# Patient Record
Sex: Male | Born: 1935
Health system: Southern US, Community
[De-identification: ages and names within clinical notes are randomized; demographics above are authoritative.]

## PROBLEM LIST (undated history)

## (undated) DIAGNOSIS — H8109 Meniere's disease, unspecified ear: Secondary | ICD-10-CM

## (undated) DIAGNOSIS — H9190 Unspecified hearing loss, unspecified ear: Secondary | ICD-10-CM

## (undated) DIAGNOSIS — Z95828 Presence of other vascular implants and grafts: Secondary | ICD-10-CM

## (undated) DIAGNOSIS — E039 Hypothyroidism, unspecified: Secondary | ICD-10-CM

## (undated) DIAGNOSIS — IMO0001 Reserved for inherently not codable concepts without codable children: Secondary | ICD-10-CM

## (undated) DIAGNOSIS — C801 Malignant (primary) neoplasm, unspecified: Secondary | ICD-10-CM

## (undated) DIAGNOSIS — T4145XA Adverse effect of unspecified anesthetic, initial encounter: Secondary | ICD-10-CM

## (undated) DIAGNOSIS — D649 Anemia, unspecified: Secondary | ICD-10-CM

## (undated) DIAGNOSIS — Z862 Personal history of diseases of the blood and blood-forming organs and certain disorders involving the immune mechanism: Secondary | ICD-10-CM

## (undated) DIAGNOSIS — Z66 Do not resuscitate: Secondary | ICD-10-CM

## (undated) DIAGNOSIS — T8859XA Other complications of anesthesia, initial encounter: Secondary | ICD-10-CM

## (undated) HISTORY — PX: OTHER SURGICAL HISTORY: SHX169

## (undated) HISTORY — DX: Meniere's disease, unspecified ear: H81.09

## (undated) HISTORY — PX: CIRCUMCISION: SUR203

## (undated) HISTORY — DX: Do not resuscitate: Z66

## (undated) HISTORY — DX: Presence of other vascular implants and grafts: Z95.828

## (undated) HISTORY — PX: COLONOSCOPY: SHX174

---

## 1958-09-28 DIAGNOSIS — Z862 Personal history of diseases of the blood and blood-forming organs and certain disorders involving the immune mechanism: Secondary | ICD-10-CM

## 1958-09-28 HISTORY — DX: Personal history of diseases of the blood and blood-forming organs and certain disorders involving the immune mechanism: Z86.2

## 1998-05-18 ENCOUNTER — Emergency Department (HOSPITAL_COMMUNITY): Admission: EM | Admit: 1998-05-18 | Discharge: 1998-05-19 | Payer: Self-pay | Admitting: Emergency Medicine

## 1998-05-20 ENCOUNTER — Ambulatory Visit (HOSPITAL_COMMUNITY): Admission: RE | Admit: 1998-05-20 | Discharge: 1998-05-20 | Payer: Self-pay | Admitting: Emergency Medicine

## 1998-05-24 ENCOUNTER — Ambulatory Visit (HOSPITAL_COMMUNITY): Admission: RE | Admit: 1998-05-24 | Discharge: 1998-05-24 | Payer: Self-pay | Admitting: *Deleted

## 1998-09-28 HISTORY — PX: ORIF ORBITAL FRACTURE: SHX5312

## 1999-08-13 ENCOUNTER — Emergency Department (HOSPITAL_COMMUNITY): Admission: EM | Admit: 1999-08-13 | Discharge: 1999-08-13 | Payer: Self-pay | Admitting: Emergency Medicine

## 1999-09-09 ENCOUNTER — Ambulatory Visit (HOSPITAL_COMMUNITY): Admission: RE | Admit: 1999-09-09 | Discharge: 1999-09-09 | Payer: Self-pay | Admitting: Gastroenterology

## 2001-03-08 ENCOUNTER — Encounter: Admission: RE | Admit: 2001-03-08 | Discharge: 2001-03-08 | Payer: Self-pay | Admitting: Internal Medicine

## 2001-03-08 ENCOUNTER — Encounter: Payer: Self-pay | Admitting: Internal Medicine

## 2003-12-18 ENCOUNTER — Encounter: Admission: RE | Admit: 2003-12-18 | Discharge: 2003-12-18 | Payer: Self-pay | Admitting: Internal Medicine

## 2004-01-15 ENCOUNTER — Emergency Department (HOSPITAL_COMMUNITY): Admission: EM | Admit: 2004-01-15 | Discharge: 2004-01-15 | Payer: Self-pay | Admitting: Emergency Medicine

## 2005-02-17 ENCOUNTER — Ambulatory Visit: Admission: RE | Admit: 2005-02-17 | Discharge: 2005-02-17 | Payer: Self-pay | Admitting: Gastroenterology

## 2005-02-17 ENCOUNTER — Encounter (INDEPENDENT_AMBULATORY_CARE_PROVIDER_SITE_OTHER): Payer: Self-pay | Admitting: Specialist

## 2005-03-30 ENCOUNTER — Encounter: Admission: RE | Admit: 2005-03-30 | Discharge: 2005-03-30 | Payer: Self-pay | Admitting: Internal Medicine

## 2005-04-06 ENCOUNTER — Encounter: Admission: RE | Admit: 2005-04-06 | Discharge: 2005-04-06 | Payer: Self-pay | Admitting: Internal Medicine

## 2006-08-23 ENCOUNTER — Ambulatory Visit: Payer: Self-pay | Admitting: Internal Medicine

## 2006-08-25 LAB — CBC WITH DIFFERENTIAL/PLATELET
BASO%: 0.7 % (ref 0.0–2.0)
Basophils Absolute: 0 10*3/uL (ref 0.0–0.1)
EOS%: 0.3 % (ref 0.0–7.0)
MCH: 32.9 pg (ref 28.0–33.4)
MCHC: 33.8 g/dL (ref 32.0–35.9)
MCV: 97.3 fL (ref 81.6–98.0)
MONO%: 23 % — ABNORMAL HIGH (ref 0.0–13.0)
NEUT#: 1.5 10*3/uL (ref 1.5–6.5)
Platelets: 212 10*3/uL (ref 145–400)
RBC: 3.68 10*6/uL — ABNORMAL LOW (ref 4.20–5.71)
RDW: 24.8 % — ABNORMAL HIGH (ref 11.2–14.6)
WBC: 5.3 10*3/uL (ref 4.0–10.0)

## 2006-08-26 LAB — COMPREHENSIVE METABOLIC PANEL
Calcium: 9.6 mg/dL (ref 8.4–10.5)
Creatinine, Ser: 0.9 mg/dL (ref 0.40–1.50)
Glucose, Bld: 89 mg/dL (ref 70–99)
Potassium: 4.4 mEq/L (ref 3.5–5.3)
Sodium: 141 mEq/L (ref 135–145)
Total Protein: 7.2 g/dL (ref 6.0–8.3)

## 2006-08-26 LAB — IRON AND TIBC
%SAT: 56 % — ABNORMAL HIGH (ref 20–55)
TIBC: 303 ug/dL (ref 215–435)

## 2006-08-26 LAB — FOLATE RBC: RBC Folate: 1689 ng/mL — ABNORMAL HIGH (ref 180–600)

## 2006-08-26 LAB — LACTATE DEHYDROGENASE: LDH: 109 U/L (ref 94–250)

## 2006-08-31 LAB — BCR/ABL QUALITATIVE: BCR/ABL t(9,22): NEGATIVE

## 2007-02-23 ENCOUNTER — Encounter: Admission: RE | Admit: 2007-02-23 | Discharge: 2007-02-23 | Payer: Self-pay | Admitting: Orthopedic Surgery

## 2007-02-24 ENCOUNTER — Ambulatory Visit (HOSPITAL_BASED_OUTPATIENT_CLINIC_OR_DEPARTMENT_OTHER): Admission: RE | Admit: 2007-02-24 | Discharge: 2007-02-24 | Payer: Self-pay | Admitting: Orthopedic Surgery

## 2007-06-29 ENCOUNTER — Encounter: Admission: RE | Admit: 2007-06-29 | Discharge: 2007-06-29 | Payer: Self-pay | Admitting: Internal Medicine

## 2007-07-08 ENCOUNTER — Encounter: Admission: RE | Admit: 2007-07-08 | Discharge: 2007-07-08 | Payer: Self-pay | Admitting: Orthopedic Surgery

## 2007-07-29 ENCOUNTER — Encounter: Admission: RE | Admit: 2007-07-29 | Discharge: 2007-07-29 | Payer: Self-pay | Admitting: Internal Medicine

## 2007-08-22 ENCOUNTER — Encounter: Admission: RE | Admit: 2007-08-22 | Discharge: 2007-08-22 | Payer: Self-pay | Admitting: Internal Medicine

## 2007-08-30 ENCOUNTER — Encounter: Admission: RE | Admit: 2007-08-30 | Discharge: 2007-08-30 | Payer: Self-pay | Admitting: Internal Medicine

## 2007-09-08 ENCOUNTER — Encounter: Admission: RE | Admit: 2007-09-08 | Discharge: 2007-09-08 | Payer: Self-pay | Admitting: Internal Medicine

## 2007-09-15 ENCOUNTER — Encounter: Admission: RE | Admit: 2007-09-15 | Discharge: 2007-09-15 | Payer: Self-pay | Admitting: Internal Medicine

## 2007-10-21 ENCOUNTER — Encounter: Admission: RE | Admit: 2007-10-21 | Discharge: 2007-10-21 | Payer: Self-pay | Admitting: Internal Medicine

## 2008-05-10 ENCOUNTER — Encounter: Admission: RE | Admit: 2008-05-10 | Discharge: 2008-05-10 | Payer: Self-pay | Admitting: Internal Medicine

## 2008-10-04 ENCOUNTER — Ambulatory Visit (HOSPITAL_COMMUNITY): Admission: RE | Admit: 2008-10-04 | Discharge: 2008-10-04 | Payer: Self-pay | Admitting: Otolaryngology

## 2009-09-28 HISTORY — PX: OTHER SURGICAL HISTORY: SHX169

## 2010-03-25 ENCOUNTER — Encounter: Admission: RE | Admit: 2010-03-25 | Discharge: 2010-03-25 | Payer: Self-pay | Admitting: Internal Medicine

## 2010-05-23 ENCOUNTER — Emergency Department (HOSPITAL_COMMUNITY)
Admission: EM | Admit: 2010-05-23 | Discharge: 2010-05-23 | Payer: Self-pay | Source: Home / Self Care | Admitting: Emergency Medicine

## 2010-10-19 ENCOUNTER — Encounter: Payer: Self-pay | Admitting: Internal Medicine

## 2011-02-10 NOTE — Op Note (Signed)
Dean Stanley, Dean Stanley              ACCOUNT NO.:  [id number]   MEDICAL RECORD NO.:  [id number]          PATIENT TYPE:  AMB   LOCATION:  DSC                          FACILITY:  [facility]   PHYSICIAN:  Rodney A. Mortenson, M.D.DATE OF BIRTH:  Feb 12, 1931   DATE OF PROCEDURE:  02/24/2007  DATE OF DISCHARGE:                               OPERATIVE REPORT   PREOPERATIVE DIAGNOSIS:  Severe osteoarthritis, right great toe  metacarpophalangeal joint.   POSTOPERATIVE DIAGNOSIS:  Severe osteoarthritis, right great toe  metacarpophalangeal joint.   OPERATION:  Arthrotomy right great toe MP joint, debridement huge  osteophyte; Keller procedure, right great toe.   SURGEON:  Lenard Galloway. Chaney Malling, M.D.   ANESTHESIA:  Regional.   PROCEDURE:  The patient placed on the operating table in supine position  with pneumatic tourniquet about the right upper thigh.  Right lower  extremity prepped DuraPrep, draped out in the usual manner.  Esmarch was  used to wrap out the foot and calf and this was used as tourniquet.  Incision was made over the dorsal aspect of the right great toe MP  joint.  Skin edges were retracted.  Superficial cutaneous nerves were  preserved and retracted out of the operative area.  Incision made over  the dorsal aspect the MP joint and MP joint was open.  Huge loose  osteophytes were found about the joint.  Severe degenerative changes  were seen.  The osteophytes were removed. Loose bodies removed.  A power  saw was then used to resect about 8 mm of the proximal phalanx to the  great toe.  This was shelled out of the joint.  Once this was  accomplished, other loose bodies removed from the joint.  A ring of  osteophytes was seen around the MP joint itself.  These this was  debrided with rongeur.  Excellent decompression was achieved.  A large  Steinmann pin was passed through the toe retrograde from the cut surface  out through the tip of the toe.  Vicryl used to take soft tissue in  pursestring manner and form a soft tissue envelope over the distal end  of the MP joint.  With the proximal phalanx in the appropriate position,  the small Steinmann pin was then passed antegrade up into the first  metatarsal shaft.  He had excellent stability and alignment of the toe  was achieved.  Esmarch was then removed.  Bleeders were coagulated.  Interrupted 4-0 nylon sutures then used to close this skin.  Sterile  dressing applied and the patient returned recovery room in excellent  condition.  Technically, this went extremely well.   DISPOSITION:  1. Percocet for pain.  2. Wooden shoe.  3. May walk on this ad lib.  4. Return to my office on Wednesday of next week.           ______________________________  Lenard Galloway. Chaney Malling, M.D.    RAM/MEDQ  D:  02/24/2007  T:  02/24/2007  Job:  [id number]

## 2011-02-11 ENCOUNTER — Other Ambulatory Visit: Payer: Self-pay | Admitting: Dermatology

## 2011-02-13 NOTE — Op Note (Signed)
NAMEJOVANNIE, Dean Stanley              ACCOUNT NO.:  [id number]   MEDICAL RECORD NO.:  [id number]          PATIENT TYPE:  AMB   LOCATION:  DFTL                         FACILITY:  [facility]   PHYSICIAN:  Petra Kuba, M.D.    DATE OF BIRTH:  November 26, 1930   DATE OF PROCEDURE:  02/17/2005  DATE OF DISCHARGE:                                 OPERATIVE REPORT   PROCEDURE:  Colonoscopy with polypectomy.   INDICATIONS FOR PROCEDURE:  Screening.   Consent was signed after risks, benefits, methods, and options were  thoroughly discussed in the office in the past.   MEDICINES USED:  Demerol 60, Versed 7.   DESCRIPTION OF PROCEDURE:  Rectal inspection was pertinent for external  hemorrhoids, small. Digital exam was negative. The video pediatric  adjustable colonoscope was inserted, easily advanced around the colon to the  cecum. This did require some abdominal pressure but no position changes. On  insertion, occasional left sided diverticula were seen.  The cecum was  identified by the appendiceal orifice and the ileocecal valve. The prep was  adequate.  There was some liquid stool that required washing and suctioning  and on slow withdrawal through the colon, the cecum and the ascending was  normal. In the approximate level of the hepatic flexure, a tiny polyp was  seen and was hot biopsied x1. The scope was slowly withdrawn. Other than the  occasional left sided diverticula, no other polypoid lesions, masses or  other abnormalities were seen as we slowly withdrew back to the rectum. Anal  rectal pullthrough and retroflexion confirmed some small hemorrhoids. The  scope was straightened and readvanced a short ways up the left side of the  colon, air was suctioned, scope removed. The patient tolerated the procedure  well. There was no obvious or immediate complications.   ENDOSCOPIC DIAGNOSIS:  1.  Internal and external hemorrhoids.  2.  Occasional left sided diverticula.  3.  Tiny proximal  transverse polyp hot biopsied.  4.  Otherwise within normal limits to the cecum.   PLAN:  Await pathology but probably recheck colon screening in 5-10 years if  doing well medically unless this is an adenoma then I would proceed in five.      MEM/MEDQ  D:  02/17/2005  T:  02/17/2005  Job:  [id number]   cc:   Antony Madura, M.D.  [address], [address]  [place]  [place] [address]  Fax: [phone]

## 2011-04-20 ENCOUNTER — Other Ambulatory Visit: Payer: Self-pay | Admitting: Physical Medicine and Rehabilitation

## 2011-04-20 DIAGNOSIS — M545 Low back pain: Secondary | ICD-10-CM

## 2011-11-02 ENCOUNTER — Other Ambulatory Visit: Payer: Self-pay | Admitting: Internal Medicine

## 2011-11-02 ENCOUNTER — Ambulatory Visit
Admission: RE | Admit: 2011-11-02 | Discharge: 2011-11-02 | Disposition: A | Discharge status: Home / Self Care | Payer: Medicare Other | Source: Ambulatory Visit | Attending: Internal Medicine | Admitting: Internal Medicine

## 2011-11-02 DIAGNOSIS — R05 Cough: Secondary | ICD-10-CM

## 2012-01-21 ENCOUNTER — Emergency Department (HOSPITAL_COMMUNITY): Payer: Medicare Other

## 2012-01-21 ENCOUNTER — Emergency Department (HOSPITAL_COMMUNITY)
Admission: EM | Admit: 2012-01-21 | Discharge: 2012-01-21 | Disposition: A | Discharge status: Home Health | Payer: Medicare Other | Attending: Emergency Medicine | Admitting: Emergency Medicine

## 2012-01-21 ENCOUNTER — Encounter (HOSPITAL_COMMUNITY): Payer: Self-pay | Admitting: Family Medicine

## 2012-01-21 DIAGNOSIS — W19XXXA Unspecified fall, initial encounter: Secondary | ICD-10-CM | POA: Insufficient documentation

## 2012-01-21 DIAGNOSIS — S20219A Contusion of unspecified front wall of thorax, initial encounter: Secondary | ICD-10-CM

## 2012-01-21 MED ORDER — HYDROCODONE-ACETAMINOPHEN 5-325 MG PO TABS: 2.0000 | ORAL_TABLET | ORAL | Status: AC | PRN

## 2012-01-21 MED ORDER — ONDANSETRON 8 MG PO TBDP
8.0000 mg | ORAL_TABLET | Freq: Once | ORAL | Status: AC
  Administered 2012-01-21: 8 mg via ORAL
  Filled 2012-01-21: qty 1

## 2012-01-21 MED ORDER — TETANUS-DIPHTH-ACELL PERTUSSIS 5-2.5-18.5 LF-MCG/0.5 IM SUSP
0.5000 mL | Freq: Once | INTRAMUSCULAR | Status: AC
  Administered 2012-01-21: 0.5 mL via INTRAMUSCULAR
  Filled 2012-01-21: qty 0.5

## 2012-01-21 MED ORDER — FENTANYL CITRATE 0.05 MG/ML IJ SOLN
50.0000 ug | Freq: Once | INTRAMUSCULAR | Status: AC
  Administered 2012-01-21: 50 ug via INTRAMUSCULAR
  Filled 2012-01-21: qty 2

## 2012-01-21 MED ORDER — ONDANSETRON 8 MG PO TBDP: 8.0000 mg | ORAL_TABLET | Freq: Three times a day (TID) | ORAL | Status: AC | PRN

## 2012-01-21 NOTE — ED Notes (Signed)
Patient states he missed a step last night and fell. No LOC. C/o left rib pain. Pain worse with inspiration.

## 2012-01-21 NOTE — Discharge Instructions (Signed)
Bone Bruise  A bone bruise is a small hidden fracture of the bone. It typically occurs with bones located close to the surface of the skin.  SYMPTOMS  The pain lasts longer than a normal bruise.   The bruised area is difficult to use.   There may be discoloration or swelling of the bruised area.   When a bone bruise is found with injury to the anterior cruciate ligament (in the knee) there is often an increased:   Amount of fluid in the knee   Time the fluid in the knee lasts.   Number of days until you are walking normally and regaining the motion you had before the injury.   Number of days with pain from the injury.  DIAGNOSIS  It can only be seen on X-rays known as MRIs. This stands for magnetic resonance imaging. A regular X-ray taken of a bone bruise would appear to be normal. A bone bruise is a common injury in the knee and the heel bone (calcaneus). The problems are similar to those produced by stress fractures, which are bone injuries caused by overuse. A bone bruise may also be a sign of other injuries. For example, bone bruises are commonly found where an anterior cruciate ligament (ACL) in the knee has been pulled away from the bone (ruptured). A ligament is a tough fibrous material that connects bones together to make our joints stable. Bruises of the bone last a lot longer than bruises of the muscle or tissues beneath the skin. Bone bruises can last from days to months and are often more severe and painful than other bruises. TREATMENT Because bone bruises are sudden injuries you cannot often prevent them, other than by being extremely careful. Some things you can do to improve the condition are:  Apply ice to the sore area for 15 to 20 minutes, 3 to 4 times per day while awake for the first 2 days. Put the ice in a plastic bag, and place a towel between the bag of ice and your skin.   Keep your bruised area raised (elevated) when possible to lessen swelling.   For activity:     Use crutches when necessary; do not put weight on the injured leg until you are no longer tender.   You may walk on your affected part as the pain allows, or as instructed.   Start weight bearing gradually on the bruised part.   Continue to use crutches or a cane until you can stand without causing pain, or as instructed.   If a plaster splint was applied, wear the splint until you are seen for a follow-up examination. Rest it on nothing harder than a pillow the first 24 hours. Do not put weight on it. Do not get it wet. You may take it off to take a shower or bath.   If an air splint was applied, more air may be blown into or out of the splint as needed for comfort. You may take it off at night and to take a shower or bath.   Wiggle your toes in the splint several times per day if you are able.   You may have been given an elastic bandage to use with the plaster splint or alone. The splint is too tight if you have numbness, tingling or if your foot becomes cold and blue. Adjust the bandage to make it comfortable.   Only take over-the-counter or prescription medicines for pain, discomfort, or fever as directed by   your caregiver.   Follow all instructions for follow up with your caregiver. This includes any orthopedic referrals, physical therapy, and rehabilitation. Any delay in obtaining necessary care could result in a delay or failure of the bones to heal.  SEEK MEDICAL CARE IF:   You have an increase in bruising, swelling, or pain.   You notice coldness of your toes.   You do not get pain relief with medications.  SEEK IMMEDIATE MEDICAL CARE IF:   Your toes are numb or blue.   You have severe pain not controlled with medications.   If any of the problems that caused you to seek care are becoming worse.  Document Released: 12/05/2003 Document Revised: 09/03/2011 Document Reviewed: 04/18/2008 ExitCare Patient Information 2012 [employer]  RESOURCE GUIDE  Dental  Problems  Patients with Medicaid: [facility]                     [facility] [address]                                           [address] Phone:  [phone]                                                   Phone:  [phone]  If unable to pay or uninsured, contact:  [facility] or [facility] to become qualified for the adult dental clinic.  Chronic Pain Problems Contact [facility]  [phone] Patients need to be referred by their primary care doctor.  Insufficient Money for Medicine Contact [employer]:  call "211" or [facility] 271-5999.  No Primary Care Doctor Call [facility]  832-8000 Other agencies that provide inexpensive medical care    [facility]  [phone]    [facility]  [phone]    [facility]  [phone]    [facility] Clinic  [phone]    [facility]  [phone]    [facility]  [phone]  Psychological Services [facility]  [phone] [facility]  [phone] [facility]   800 853-5163 (emergency services 641-4993)  Abuse/Neglect [place] Child Abuse Hotline (336) 641-3795 [place] [facility] Abuse Hotline 800-378-5315 (After Hours)  Emergency Shelter [facility] (336) 271-5985  [facility] (336) 275-9566 [facility] (704) 372-4663  MRSA Hotline #:   832-7006    [place] [facility]  [facility] [place]  [employer]                           [facility] [address] [place]                     [address]         [address]  [place]                                                 [place]                              [place] Phone:  [phone]                                  Phone:  [phone]                   Phone:  [phone]  [facility] Phone:  [phone]  [place] Child Abuse Hotline (336) 342-1394 (336) 342-3537 (After Hours)  

## 2012-01-21 NOTE — ED Provider Notes (Signed)
History     CSN: [id number]  Arrival date & time 01/21/12  0301   First MD Initiated Contact with Patient 01/21/12 [phone]      Chief Complaint  Patient presents with  . Fall  . Rib Injury    (Consider location/radiation/quality/duration/timing/severity/associated sxs/prior treatment) HPI  80yoM presents with rib pain after fall. Pt states that just pta he "missed a step and fell onto the cement". Denies head trauma. Fell onto left side. C/O left sided chest/rib pain, worse with movement and deep breathing. Denies shortness of breath but "can't get in a full breath". Denies neck pain, back pain. No anticoagulants. Abrasions to b/l knees. Last tetanus 6 years ago.   ED Notes, ED Provider Notes from 01/21/12 0000 to 01/21/12 03:11:11       Dorris Carnes, RN 01/21/2012 03:09      Patient states he missed a step last night and fell. No LOC. C/o left rib pain. Pain worse with inspiration.     History reviewed. No pertinent past medical history.  Past Surgical History  Procedure Date  . Colonoscopy     History reviewed. No pertinent family history.  History  Substance Use Topics  . Smoking status: Never Smoker   . Smokeless tobacco: Not on file  . Alcohol Use: Yes     Occasional    Review of Systems  All other systems reviewed and are negative.  except as noted HPI  Allergies  Hydrocodone and Oxycodone  Home Medications   Current Outpatient Rx  Name Route Sig Dispense Refill  . DESMOPRESSIN ACETATE 0.2 MG PO TABS Oral Take 0.2 mg by mouth daily.    Marland Kitchen LEVOTHYROXINE SODIUM 200 MCG PO TABS Oral Take 200 mcg by mouth daily.    Marland Kitchen PREDNISONE 10 MG PO TABS Oral Take 10 mg by mouth daily.    Marland Kitchen HYDROCODONE-ACETAMINOPHEN 5-325 MG PO TABS Oral Take 2 tablets by mouth every 4 (four) hours as needed for pain. 10 tablet 0  . ONDANSETRON 8 MG PO TBDP Oral Take 1 tablet (8 mg total) by mouth every 8 (eight) hours as needed for nausea. 20 tablet 0    BP 118/65  Pulse 32   Temp(Src) 98.7 F (37.1 C) (Oral)  Resp 16  Wt 186 lb (84.369 kg)  SpO2 95%  Physical Exam  Nursing note and vitals reviewed. Constitutional: He is oriented to person, place, and time. He appears well-developed and well-nourished. No distress.  HENT:  Head: Atraumatic.  Mouth/Throat: Oropharynx is clear and moist.  Eyes: Conjunctivae are normal. Pupils are equal, round, and reactive to light.  Neck: Neck supple.  Cardiovascular: Normal rate, regular rhythm, normal heart sounds and intact distal pulses.  Exam reveals no gallop and no friction rub.   No murmur heard. Pulmonary/Chest: Effort normal. No respiratory distress. He has no wheezes. He has no rales. He exhibits tenderness.         Quick, shallow breathing 2/2 pain  Abdominal: Soft. Bowel sounds are normal. There is no tenderness. There is no rebound and no guarding.  Musculoskeletal: Normal range of motion. He exhibits no edema and no tenderness.  Neurological: He is alert and oriented to person, place, and time.  Skin: Skin is warm and dry.       B/l knee with < 1cm abrasions  Rt hand palmar aspect with min abrasion  Psychiatric: He has a normal mood and affect.    ED Course  Procedures (including critical care time)  Labs  Reviewed - No data to display Dg Ribs Unilateral W/chest Left  01/21/2012  *RADIOLOGY REPORT*  Clinical Data: Left upper anterior rib pain after fall.  LEFT RIBS AND CHEST - 3+ VIEW  Comparison: 11/02/2011  Findings: Normal heart size and pulmonary vascularity. Peribronchial thickening and interstitial changes consistent with chronic bronchitis.  Interval development of infiltration or atelectasis in the left lung base.  No blunting of costophrenic angles.  No pneumothorax.  The left ribs appear intact.  No evidence of acute fracture or displacement.  No focal bone lesion or expansile changes.  IMPRESSION: Atelectasis or infiltration in the left lung base.  No displaced left rib fractures identified.   Original Report Authenticated By: Marlon Pel, M.D.   1. Rib contusion   2. Fall    MDM  S/P mechanical fall with rib/chest wall contusion. Fentanyl given in ED with zofran for pain control. Feeling better than previous.  "allergy" to hydrocodone includes nausea but patient tolerated it previously after finger injury 04/2010. Zofran odt as well for home. Incentive spirometer. Tetanus updated. No EMC precluding discharge at this time. Given Precautions for return. PMD f/u.         Forbes Cellar, MD 01/21/12 [phone]

## 2012-06-07 ENCOUNTER — Other Ambulatory Visit: Payer: Self-pay | Admitting: Dermatology

## 2012-12-05 ENCOUNTER — Other Ambulatory Visit: Payer: Self-pay | Admitting: Dermatology

## 2013-12-04 ENCOUNTER — Other Ambulatory Visit: Payer: Self-pay | Admitting: Dermatology

## 2013-12-27 ENCOUNTER — Other Ambulatory Visit: Payer: Self-pay | Admitting: Orthopedic Surgery

## 2013-12-27 DIAGNOSIS — R1900 Intra-abdominal and pelvic swelling, mass and lump, unspecified site: Secondary | ICD-10-CM

## 2014-01-05 ENCOUNTER — Ambulatory Visit
Admission: RE | Admit: 2014-01-05 | Discharge: 2014-01-05 | Disposition: A | Discharge status: Home / Self Care | Payer: Medicare Other | Source: Ambulatory Visit | Attending: Orthopedic Surgery | Admitting: Orthopedic Surgery

## 2014-01-05 DIAGNOSIS — R1900 Intra-abdominal and pelvic swelling, mass and lump, unspecified site: Secondary | ICD-10-CM

## 2014-01-05 MED ORDER — GADOBENATE DIMEGLUMINE 529 MG/ML IV SOLN
17.0000 mL | Freq: Once | INTRAVENOUS | Status: AC | PRN
  Administered 2014-01-05: 17 mL via INTRAVENOUS

## 2014-11-16 ENCOUNTER — Telehealth: Payer: Self-pay | Admitting: Internal Medicine

## 2014-11-16 NOTE — Telephone Encounter (Signed)
NEED CALL PT BACK DRIVEN ON THE ROAD

## 2014-11-19 ENCOUNTER — Telehealth: Payer: Self-pay | Admitting: Internal Medicine

## 2014-11-19 NOTE — Telephone Encounter (Signed)
pt confirmed appt on 12/19/14 at 11 am w/ Julien Nordmann Dx: anemia Referring Dr. Mancel Bale

## 2014-11-21 ENCOUNTER — Telehealth: Payer: Self-pay | Admitting: Internal Medicine

## 2014-11-21 NOTE — Telephone Encounter (Signed)
Chart delivered.  TG 11/21/12

## 2014-12-18 ENCOUNTER — Other Ambulatory Visit: Payer: Self-pay | Admitting: Internal Medicine

## 2014-12-18 DIAGNOSIS — D649 Anemia, unspecified: Secondary | ICD-10-CM | POA: Insufficient documentation

## 2014-12-18 DIAGNOSIS — D539 Nutritional anemia, unspecified: Secondary | ICD-10-CM

## 2014-12-19 ENCOUNTER — Encounter: Payer: Self-pay | Admitting: Internal Medicine

## 2014-12-19 ENCOUNTER — Ambulatory Visit: Payer: Medicare Other

## 2014-12-19 ENCOUNTER — Other Ambulatory Visit (HOSPITAL_BASED_OUTPATIENT_CLINIC_OR_DEPARTMENT_OTHER): Payer: Medicare Other

## 2014-12-19 ENCOUNTER — Telehealth: Payer: Self-pay | Admitting: Internal Medicine

## 2014-12-19 ENCOUNTER — Ambulatory Visit (HOSPITAL_BASED_OUTPATIENT_CLINIC_OR_DEPARTMENT_OTHER): Payer: Medicare Other | Admitting: Internal Medicine

## 2014-12-19 ENCOUNTER — Telehealth: Payer: Self-pay

## 2014-12-19 VITALS — BP 111/59 | HR 61 | Temp 97.5°F | Resp 18 | Ht 72.0 in | Wt 179.5 lb

## 2014-12-19 DIAGNOSIS — D539 Nutritional anemia, unspecified: Secondary | ICD-10-CM

## 2014-12-19 DIAGNOSIS — H8109 Meniere's disease, unspecified ear: Secondary | ICD-10-CM | POA: Insufficient documentation

## 2014-12-19 DIAGNOSIS — D649 Anemia, unspecified: Secondary | ICD-10-CM

## 2014-12-19 LAB — COMPREHENSIVE METABOLIC PANEL (CC13)
ALT: 14 U/L (ref 0–55)
ANION GAP: 8 meq/L (ref 3–11)
AST: 18 U/L (ref 5–34)
Albumin: 4.3 g/dL (ref 3.5–5.0)
Alkaline Phosphatase: 44 U/L (ref 40–150)
BILIRUBIN TOTAL: 1.43 mg/dL — AB (ref 0.20–1.20)
BUN: 12.1 mg/dL (ref 7.0–26.0)
CHLORIDE: 107 meq/L (ref 98–109)
CO2: 25 mEq/L (ref 22–29)
Calcium: 9 mg/dL (ref 8.4–10.4)
Creatinine: 0.8 mg/dL (ref 0.7–1.3)
EGFR: 85 mL/min/{1.73_m2} — AB (ref >90)
GLUCOSE: 90 mg/dL (ref 70–140)
Potassium: 4.5 mEq/L (ref 3.5–5.1)
Sodium: 140 mEq/L (ref 136–145)
TOTAL PROTEIN: 7.4 g/dL (ref 6.4–8.3)

## 2014-12-19 LAB — CBC & DIFF AND RETIC
BASO%: 0.5 % (ref 0.0–2.0)
Basophils Absolute: 0 10*3/uL (ref 0.0–0.1)
EOS ABS: 0 10*3/uL (ref 0.0–0.5)
EOS%: 0.5 % (ref 0.0–7.0)
HCT: 36.7 % — ABNORMAL LOW (ref 38.4–49.9)
HGB: 11.8 g/dL — ABNORMAL LOW (ref 13.0–17.1)
Immature Retic Fract: 11.3 % — ABNORMAL HIGH (ref 3.00–10.60)
LYMPH%: 61.5 % — AB (ref 14.0–49.0)
MCH: 30.8 pg (ref 27.2–33.4)
MCHC: 32.2 g/dL (ref 32.0–36.0)
MCV: 95.8 fL (ref 79.3–98.0)
MONO#: 0.7 10*3/uL (ref 0.1–0.9)
MONO%: 16 % — ABNORMAL HIGH (ref 0.0–14.0)
NEUT%: 21.5 % — ABNORMAL LOW (ref 39.0–75.0)
NEUTROS ABS: 0.9 10*3/uL — AB (ref 1.5–6.5)
NRBC: 2 % — AB (ref 0–0)
PLATELETS: 132 10*3/uL — AB (ref 140–400)
RBC: 3.83 10*6/uL — AB (ref 4.20–5.82)
RDW: 17.6 % — ABNORMAL HIGH (ref 11.0–14.6)
RETIC %: 1.72 % (ref 0.80–1.80)
Retic Ct Abs: 65.88 10*3/uL (ref 34.80–93.90)
WBC: 4.1 10*3/uL (ref 4.0–10.3)
lymph#: 2.5 10*3/uL (ref 0.9–3.3)

## 2014-12-19 LAB — IRON AND TIBC CHCC
%SAT: 72 % — AB (ref 20–55)
IRON: 140 ug/dL (ref 42–163)
TIBC: 194 ug/dL — AB (ref 202–409)
UIBC: 53 ug/dL — AB (ref 117–376)

## 2014-12-19 LAB — LACTATE DEHYDROGENASE (CC13): LDH: 138 U/L (ref 125–245)

## 2014-12-19 LAB — TECHNOLOGIST REVIEW

## 2014-12-19 LAB — FERRITIN CHCC: FERRITIN: 600 ng/mL — AB (ref 22–316)

## 2014-12-19 MED ORDER — INTEGRA PLUS PO CAPS: 1.0000 | ORAL_CAPSULE | Freq: Every morning | ORAL | Status: DC

## 2014-12-19 NOTE — Progress Notes (Signed)
Checked in new pt with no financial concerns. °

## 2014-12-19 NOTE — Telephone Encounter (Signed)
left voicemail for pt regarding to May appt....mailed pt appt sched/avs adn letter

## 2014-12-19 NOTE — Telephone Encounter (Signed)
Pt relaying information of his medical history. Last colonoscopy 2006 with Dr Watt Climes. Dizzy spells in 1980s was from meniere's syndrome.

## 2014-12-21 LAB — PROTEIN ELECTROPHORESIS, SERUM, WITH REFLEX
Albumin ELP: 4.4 g/dL (ref 3.8–4.8)
Alpha-1-Globulin: 0.2 g/dL (ref 0.2–0.3)
Alpha-2-Globulin: 0.5 g/dL (ref 0.5–0.9)
Beta 2: 0.3 g/dL (ref 0.2–0.5)
Beta Globulin: 0.3 g/dL — ABNORMAL LOW (ref 0.4–0.6)
GAMMA GLOBULIN: 1.5 g/dL (ref 0.8–1.7)
Total Protein, Serum Electrophoresis: 7.2 g/dL (ref 6.1–8.1)

## 2014-12-21 LAB — ERYTHROPOIETIN: ERYTHROPOIETIN: 23.4 m[IU]/mL — AB (ref 2.6–18.5)

## 2014-12-22 NOTE — Progress Notes (Signed)
[place] Telephone:([phone]) [phone]   Fax:([phone]) [phone]  CONSULT NOTE  REFERRING PHYSICIAN: Dr. Lorene Dy  REASON FOR CONSULTATION:  81 years old white male with persistent anemia.  HPI Dean Stanley is a 81 y.o. 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 The patient also has a history of vertigo in the 80s in addition to history of sciatica. Otherwise he has been very healthy. He was seen recently by his primary care physician Dr. Mancel Bale for routine physical exam and evaluation and repeat CBC performed on 11/08/2014 showed mild anemia with hemoglobin of 12.0 and hematocrit 36.0%. The patient has normal white blood count of 5.4 and normal platelets count of 160,000. Vitamin B 12 was normal at 907. Serum folate was also normal at 16.7 and serum protein electrophoreses showed nonspecific polyclonal pattern. Serum iron was 81.  Reviewing his previous records showed that the patient had persistent anemia for years. On 07/25/2010 his hemoglobin was 11.9 and hematocrit 37.2%. On 07/27/2011 his hemoglobin was 12.9 and hematocrit 39.6%. On 07/27/2013 his hemoglobin was 13.3 and hematocrit 38.8%. The patient has no significant bleeding issues except for occasional hemorrhoidal bleeding. He denied having any significant bruises or ecchymosis. The patient denied having any significant complaints related to his anemia except for mild fatigue. He denied having any current dizzy spells. He has no chest pain, shortness breath, cough or hemoptysis. His last colonoscopy was performed in 2006 by Dr. Watt Climes and it showed no abnormalities. His other main complaint is persistent back pain secondary to degenerative disc disease and the patient sleeps in a recliner chair masses at time. Family history significant for mother with liver disease and diabetes mellitus and father with pancreatic cancer. He also  has a brother with colon cancer. The patient is married and has 3 children. He used to work as a Pharmacist, hospital and retired in 1987. Has remote history of smoking and quit in 1999. He drinks alcohol occasionally and no history of drug abuse.   HPI  Past Medical History  Diagnosis Date  . Meniere's syndrome 1980s    Past Surgical History  Procedure Laterality Date  . Colonoscopy      History reviewed. No pertinent family history.  Social History History  Substance Use Topics  . Smoking status: Never Smoker   . Smokeless tobacco: Not on file  . Alcohol Use: Yes     Comment: Occasional    Allergies  Allergen Reactions  . Hydrocodone   . Oxycodone     Current Outpatient Prescriptions  Medication Sig Dispense Refill  . desmopressin (DDAVP) 0.2 MG tablet Take 0.2 mg by mouth daily.    Marland Kitchen levothyroxine (SYNTHROID, LEVOTHROID) 200 MCG tablet Take 200 mcg by mouth daily.    Marland Kitchen FeFum-FePoly-FA-B Cmp-C-Biot (INTEGRA PLUS) CAPS Take 1 capsule by mouth every morning. 30 capsule 2   No current facility-administered medications for this visit.    Review of Systems  Constitutional: positive for fatigue Eyes: negative Ears, nose, mouth, throat, and face: negative Respiratory: negative Cardiovascular: negative Gastrointestinal: negative Genitourinary:negative Integument/breast: negative Hematologic/lymphatic: negative Musculoskeletal:negative Neurological: negative Behavioral/Psych: negative Endocrine: negative Allergic/Immunologic: negative  Physical Exam  UVO:ZDGUY, healthy, no distress, well nourished and well developed SKIN: skin color, texture, turgor are normal, no rashes or significant lesions HEAD: Normocephalic, No masses, lesions, tenderness or abnormalities EYES: normal, PERRLA, Conjunctiva are pink and non-injected EARS: External ears normal, Canals clear OROPHARYNX:no  exudate, no erythema and lips, buccal mucosa, and tongue normal  NECK: supple, no adenopathy, no  JVD LYMPH:  no palpable lymphadenopathy, no hepatosplenomegaly LUNGS: clear to auscultation , and palpation HEART: regular rate & rhythm, no murmurs and no gallops ABDOMEN:abdomen soft, non-tender, normal bowel sounds and no masses or organomegaly BACK: Back symmetric, no curvature., No CVA tenderness EXTREMITIES:no joint deformities, effusion, or inflammation, no edema, no skin discoloration, no clubbing  NEURO: alert & oriented x 3 with fluent speech, no focal motor/sensory deficits  PERFORMANCE STATUS: ECOG 1  LABORATORY DATA: Lab Results  Component Value Date   WBC 4.1 12/19/2014   HGB 11.8* 12/19/2014   HCT 36.7* 12/19/2014   MCV 95.8 12/19/2014   PLT 132* 12/19/2014      Chemistry      Component Value Date/Time   NA 140 12/19/2014 1103   NA 141 08/25/2006 1353   K 4.5 12/19/2014 1103   K 4.4 08/25/2006 1353   CL 104 08/25/2006 1353   CO2 25 12/19/2014 1103   CO2 29 08/25/2006 1353   BUN 12.1 12/19/2014 1103   BUN 14 08/25/2006 1353   CREATININE 0.8 12/19/2014 1103   CREATININE 0.90 08/25/2006 1353      Component Value Date/Time   CALCIUM 9.0 12/19/2014 1103   CALCIUM 9.6 08/25/2006 1353   ALKPHOS 44 12/19/2014 1103   ALKPHOS 45 08/25/2006 1353   AST 18 12/19/2014 1103   AST 18 08/25/2006 1353   ALT 14 12/19/2014 1103   ALT 17 08/25/2006 1353   BILITOT 1.43* 12/19/2014 1103   BILITOT 1.2 08/25/2006 1353       RADIOGRAPHIC STUDIES: No results found.  ASSESSMENT: This is a very pleasant 81 years old white male with persistent mild normocytic anemia of unclear etiology. This could be secondary to the minor hemorrhoidal bleed, but another etiology like myelodysplastic syndrome cannot be ruled out at this point. He also has a history of ITP more than 40 years ago and he did not have any issues with that for so many years.   PLAN: I had a lengthy discussion with the patient today about his condition. I ordered several studies for evaluation of his anemia  including repeat CBC, comprehensive metabolic panel, LDH, serum erythropoietin, serum protein electrophoresis, iron study and ferritin. I will empirically start the patient on Integra plus1 capsule by mouth daily for the next few weeks. I will see him back for follow-up visit in 2 months for reevaluation and further recommendation regarding his condition. If no clear etiology for his persistent anemia, I may consider the patient for a bone marrow biopsy and aspirate to rule out any myelodysplastic abnormalities. The patient agreed to the current plan. He was advised to call if he has any concerning symptoms in the interval.  The patient voices understanding of current disease status and treatment options and is in agreement with the current care plan.  All questions were answered. The patient knows to call the clinic with any problems, questions or concerns. We can certainly see the patient much sooner if necessary.  Thank you so much for allowing me to participate in the care of Dean Stanley. I will continue to follow up the patient with you and assist in his care.  I spent 40 minutes counseling the patient face to face. The total time spent in the appointment was 60 minutes.  Disclaimer: This note was dictated with voice recognition software. Similar sounding words can inadvertently be transcribed and may not  be corrected upon review.   Ashwika Freels K. December 22, 2014, 1:28 PM

## 2015-02-18 ENCOUNTER — Telehealth: Payer: Self-pay | Admitting: Internal Medicine

## 2015-02-18 ENCOUNTER — Encounter: Payer: Self-pay | Admitting: Internal Medicine

## 2015-02-18 ENCOUNTER — Other Ambulatory Visit (HOSPITAL_BASED_OUTPATIENT_CLINIC_OR_DEPARTMENT_OTHER): Payer: Medicare Other

## 2015-02-18 ENCOUNTER — Ambulatory Visit (HOSPITAL_BASED_OUTPATIENT_CLINIC_OR_DEPARTMENT_OTHER): Payer: Medicare Other | Admitting: Internal Medicine

## 2015-02-18 VITALS — BP 106/61 | HR 73 | Temp 98.2°F | Resp 18 | Ht 72.0 in | Wt 181.7 lb

## 2015-02-18 DIAGNOSIS — D539 Nutritional anemia, unspecified: Secondary | ICD-10-CM

## 2015-02-18 DIAGNOSIS — D649 Anemia, unspecified: Secondary | ICD-10-CM | POA: Diagnosis present

## 2015-02-18 LAB — CBC WITH DIFFERENTIAL/PLATELET
BASO%: 0.2 % (ref 0.0–2.0)
Basophils Absolute: 0 10*3/uL (ref 0.0–0.1)
EOS%: 0.5 % (ref 0.0–7.0)
Eosinophils Absolute: 0 10*3/uL (ref 0.0–0.5)
HEMATOCRIT: 35.6 % — AB (ref 38.4–49.9)
HGB: 11.8 g/dL — ABNORMAL LOW (ref 13.0–17.1)
LYMPH#: 2.5 10*3/uL (ref 0.9–3.3)
LYMPH%: 43 % (ref 14.0–49.0)
MCH: 32 pg (ref 27.2–33.4)
MCHC: 33.1 g/dL (ref 32.0–36.0)
MCV: 96.5 fL (ref 79.3–98.0)
MONO#: 1.4 10*3/uL — AB (ref 0.1–0.9)
MONO%: 23.3 % — ABNORMAL HIGH (ref 0.0–14.0)
NEUT#: 1.9 10*3/uL (ref 1.5–6.5)
NEUT%: 33 % — AB (ref 39.0–75.0)
Platelets: 125 10*3/uL — ABNORMAL LOW (ref 140–400)
RBC: 3.69 10*6/uL — AB (ref 4.20–5.82)
RDW: 18.5 % — AB (ref 11.0–14.6)
WBC: 5.8 10*3/uL (ref 4.0–10.3)
nRBC: 1 % — ABNORMAL HIGH (ref 0–0)

## 2015-02-18 NOTE — Telephone Encounter (Signed)
per pof to sch pt appt-gave pt copy of sch °

## 2015-02-18 NOTE — Progress Notes (Signed)
[place] [phone]   Fax:([phone]) [place], [place] [address]  DIAGNOSIS: 1)  Mild anemia of chronic disease. 2)  History of ITP  PRIOR THERAPY: none  CURRENT THERAPY: observation.  INTERVAL HISTORY: Dean Stanley 81 y.o. male returns to the clinic today for  Follow-up visit. The patient is feeling fine today except for mild fatigue. He was tried on an empiric course of Integra plus but did not notice any improvement in his condition. His wife give him centrum tablets and he felt a little bit better.  He has extensive studies for evaluation of his anemia that showed no significant abnormalities.  He has repeat  CBC performed earlier today and is here for evaluation and discussion of his lab results.  MEDICAL HISTORY: Past Medical History  Diagnosis Date  . Meniere's syndrome 1980s    ALLERGIES:  is allergic to hydrocodone and oxycodone.  MEDICATIONS:  Current Outpatient Prescriptions  Medication Sig Dispense Refill  . desmopressin (DDAVP) 0.2 MG tablet Take 0.2 mg by mouth daily.    Marland Kitchen FeFum-FePoly-FA-B Cmp-C-Biot (INTEGRA PLUS) CAPS Take 1 capsule by mouth every morning. 30 capsule 2  . levothyroxine (SYNTHROID, LEVOTHROID) 200 MCG tablet Take 200 mcg by mouth daily.     No current facility-administered medications for this visit.    SURGICAL HISTORY:  Past Surgical History  Procedure Laterality Date  . Colonoscopy      REVIEW OF SYSTEMS:  A comprehensive review of systems was negative except for: Constitutional: positive for fatigue   PHYSICAL EXAMINATION: General appearance: alert, cooperative, fatigued and no distress Head: Normocephalic, without obvious abnormality, atraumatic Neck: no adenopathy, no JVD, supple, symmetrical, trachea midline and thyroid not enlarged, symmetric, no tenderness/mass/nodules Lymph nodes: Cervical, supraclavicular, and  axillary nodes normal. Resp: clear to auscultation bilaterally Back: symmetric, no curvature. ROM normal. No CVA tenderness. Cardio: regular rate and rhythm, S1, S2 normal, no murmur, click, rub or gallop GI: soft, non-tender; bowel sounds normal; no masses,  no organomegaly Extremities: extremities normal, atraumatic, no cyanosis or edema  ECOG PERFORMANCE STATUS: 1 - Symptomatic but completely ambulatory  There were no vitals taken for this visit.  LABORATORY DATA: Lab Results  Component Value Date   WBC 5.8 02/18/2015   HGB 11.8* 02/18/2015   HCT 35.6* 02/18/2015   MCV 96.5 02/18/2015   PLT 125* 02/18/2015      Chemistry      Component Value Date/Time   NA 140 12/19/2014 1103   NA 141 08/25/2006 1353   K 4.5 12/19/2014 1103   K 4.4 08/25/2006 1353   CL 104 08/25/2006 1353   CO2 25 12/19/2014 1103   CO2 29 08/25/2006 1353   BUN 12.1 12/19/2014 1103   BUN 14 08/25/2006 1353   CREATININE 0.8 12/19/2014 1103   CREATININE 0.90 08/25/2006 1353      Component Value Date/Time   CALCIUM 9.0 12/19/2014 1103   CALCIUM 9.6 08/25/2006 1353   ALKPHOS 44 12/19/2014 1103   ALKPHOS 45 08/25/2006 1353   AST 18 12/19/2014 1103   AST 18 08/25/2006 1353   ALT 14 12/19/2014 1103   ALT 17 08/25/2006 1353   BILITOT 1.43* 12/19/2014 1103   BILITOT 1.2 08/25/2006 1353       RADIOGRAPHIC STUDIES: No results found.  ASSESSMENT AND PLAN:  This is a very pleasant 81 years old white male with persistent anemia questionable for  anemia of chronic disease versus myelodysplastic syndrome. He was tried on an empiric course of Integra plus with no improvement in his hemoglobin and hematocrit or general condition.  I discussed with the patient again considering a bone marrow biopsy and aspirate versus continuous observation.  He is not interested in proceeding with the bone marrow biopsy and aspirate at this point.  I recommended for him to continue on observation with repeat CBC, iron study  and ferritin 6 months.  He was advised to call immediately if he has any concerning symptoms in the interval. The patient voices understanding of current disease status and treatment options and is in agreement with the current care plan.  All questions were answered. The patient knows to call the clinic with any problems, questions or concerns. We can certainly see the patient much sooner if necessary.  Disclaimer: This note was dictated with voice recognition software. Similar sounding words can inadvertently be transcribed and may not be corrected upon review.

## 2015-08-12 ENCOUNTER — Other Ambulatory Visit: Payer: Self-pay | Admitting: Internal Medicine

## 2015-08-12 ENCOUNTER — Ambulatory Visit
Admission: RE | Admit: 2015-08-12 | Discharge: 2015-08-12 | Disposition: A | Discharge status: Home / Self Care | Payer: Medicare Other | Source: Ambulatory Visit | Attending: Internal Medicine | Admitting: Internal Medicine

## 2015-08-12 DIAGNOSIS — N200 Calculus of kidney: Secondary | ICD-10-CM

## 2015-08-20 ENCOUNTER — Other Ambulatory Visit (HOSPITAL_BASED_OUTPATIENT_CLINIC_OR_DEPARTMENT_OTHER): Payer: Medicare Other

## 2015-08-20 ENCOUNTER — Telehealth: Payer: Self-pay | Admitting: *Deleted

## 2015-08-20 DIAGNOSIS — D539 Nutritional anemia, unspecified: Secondary | ICD-10-CM

## 2015-08-20 LAB — CBC WITH DIFFERENTIAL/PLATELET
BASO%: 1 % (ref 0.0–2.0)
Basophils Absolute: 0 10*3/uL (ref 0.0–0.1)
EOS ABS: 0 10*3/uL (ref 0.0–0.5)
EOS%: 0.5 % (ref 0.0–7.0)
HEMATOCRIT: 31.3 % — AB (ref 38.4–49.9)
HEMOGLOBIN: 10.4 g/dL — AB (ref 13.0–17.1)
LYMPH#: 1.4 10*3/uL (ref 0.9–3.3)
LYMPH%: 69.6 % — ABNORMAL HIGH (ref 14.0–49.0)
MCH: 33.7 pg — ABNORMAL HIGH (ref 27.2–33.4)
MCHC: 33.2 g/dL (ref 32.0–36.0)
MCV: 101.3 fL — ABNORMAL HIGH (ref 79.3–98.0)
MONO#: 0.3 10*3/uL (ref 0.1–0.9)
MONO%: 12.6 % (ref 0.0–14.0)
NEUT%: 16.3 % — ABNORMAL LOW (ref 39.0–75.0)
NEUTROS ABS: 0.3 10*3/uL — AB (ref 1.5–6.5)
PLATELETS: 130 10*3/uL — AB (ref 140–400)
RBC: 3.09 10*6/uL — ABNORMAL LOW (ref 4.20–5.82)
RDW: 20.9 % — ABNORMAL HIGH (ref 11.0–14.6)
WBC: 2.1 10*3/uL — AB (ref 4.0–10.3)
nRBC: 3 % — ABNORMAL HIGH (ref 0–0)

## 2015-08-20 LAB — FERRITIN CHCC: Ferritin: 600 ng/ml — ABNORMAL HIGH (ref 22–316)

## 2015-08-20 LAB — IRON AND TIBC CHCC
IRON: 215 ug/dL — AB (ref 42–163)
TIBC: 193 ug/dL — AB (ref 202–409)

## 2015-08-20 NOTE — Telephone Encounter (Signed)
ANC 0.3, notified pt to call office if sick with fever chills. Pt advised he is on an antibiotic already that other Dr gave him due to the scan results. Encouraged pt to wear mask if out in public, wash hands call office with any concerns.

## 2015-08-27 ENCOUNTER — Encounter: Payer: Self-pay | Admitting: Internal Medicine

## 2015-08-27 ENCOUNTER — Telehealth: Payer: Self-pay | Admitting: Medical Oncology

## 2015-08-27 ENCOUNTER — Ambulatory Visit (HOSPITAL_BASED_OUTPATIENT_CLINIC_OR_DEPARTMENT_OTHER): Payer: Medicare Other | Admitting: Internal Medicine

## 2015-08-27 ENCOUNTER — Telehealth: Payer: Self-pay | Admitting: Internal Medicine

## 2015-08-27 VITALS — BP 113/54 | HR 67 | Temp 98.3°F | Resp 18 | Ht 72.0 in | Wt 179.9 lb

## 2015-08-27 DIAGNOSIS — D61818 Other pancytopenia: Secondary | ICD-10-CM | POA: Insufficient documentation

## 2015-08-27 DIAGNOSIS — D539 Nutritional anemia, unspecified: Secondary | ICD-10-CM

## 2015-08-27 DIAGNOSIS — D649 Anemia, unspecified: Secondary | ICD-10-CM | POA: Diagnosis present

## 2015-08-27 DIAGNOSIS — D709 Neutropenia, unspecified: Secondary | ICD-10-CM | POA: Diagnosis not present

## 2015-08-27 DIAGNOSIS — D72819 Decreased white blood cell count, unspecified: Secondary | ICD-10-CM | POA: Diagnosis not present

## 2015-08-27 NOTE — Progress Notes (Signed)
[place] Telephone:([phone]   Fax:(336) [place], [place] [address]  DIAGNOSIS: 1)  Mild anemia of chronic disease. 2)  History of ITP  PRIOR THERAPY: None  CURRENT THERAPY: observation.  INTERVAL HISTORY: Dean Stanley 81 y.o. male returns to the clinic today for follow-up visit. The patient is currently in the left chest pain secondary to recent urinary tract infection treated with a course of antibiotics by his urologist and currently on muscle relaxants and pain medication by Dr. Jeffie Pollock. He denied having any other significant complaints. He has no fever or chills.  He has no bleeding issues. He has extensive studies for evaluation of his anemia that showed no significant abnormalities.  The patient had repeat CBC, iron study and ferritin performed recently and he is here for evaluation and discussion of his lab results.  MEDICAL HISTORY: Past Medical History  Diagnosis Date  . Meniere's syndrome 1980s    ALLERGIES:  is allergic to hydrocodone and oxycodone.  MEDICATIONS:  Current Outpatient Prescriptions  Medication Sig Dispense Refill  . desmopressin (DDAVP) 0.2 MG tablet Take 0.2 mg by mouth daily.    . finasteride (PROPECIA) 1 MG tablet Take 1 mg by mouth daily.    Marland Kitchen levothyroxine (SYNTHROID, LEVOTHROID) 200 MCG tablet Take 200 mcg by mouth daily.    . Multiple Vitamins-Minerals (CENTRUM ADULTS PO) Take 1 each by mouth.     No current facility-administered medications for this visit.    SURGICAL HISTORY:  Past Surgical History  Procedure Laterality Date  . Colonoscopy      REVIEW OF SYSTEMS:  A comprehensive review of systems was negative except for: Constitutional: positive for fatigue Genitourinary: positive for Right flank pain Musculoskeletal: positive for back pain   PHYSICAL EXAMINATION: General appearance: alert, cooperative, fatigued and no  distress Head: Normocephalic, without obvious abnormality, atraumatic Neck: no adenopathy, no JVD, supple, symmetrical, trachea midline and thyroid not enlarged, symmetric, no tenderness/mass/nodules Lymph nodes: Cervical, supraclavicular, and axillary nodes normal. Resp: clear to auscultation bilaterally Back: symmetric, no curvature. ROM normal. No CVA tenderness. Cardio: regular rate and rhythm, S1, S2 normal, no murmur, click, rub or gallop GI: soft, non-tender; bowel sounds normal; no masses,  no organomegaly Extremities: extremities normal, atraumatic, no cyanosis or edema  ECOG PERFORMANCE STATUS: 1 - Symptomatic but completely ambulatory  Blood pressure 113/54, pulse 67, temperature 98.3 F (36.8 C), temperature source Oral, resp. rate 18, height 6' (1.829 m), weight 179 lb 14.4 oz (81.602 kg), SpO2 100 %.  LABORATORY DATA: Lab Results  Component Value Date   WBC 2.1* 08/20/2015   HGB 10.4* 08/20/2015   HCT 31.3* 08/20/2015   MCV 101.3* 08/20/2015   PLT 130* 08/20/2015      Chemistry      Component Value Date/Time   NA 140 12/19/2014 1103   NA 141 08/25/2006 1353   K 4.5 12/19/2014 1103   K 4.4 08/25/2006 1353   CL 104 08/25/2006 1353   CO2 25 12/19/2014 1103   CO2 29 08/25/2006 1353   BUN 12.1 12/19/2014 1103   BUN 14 08/25/2006 1353   CREATININE 0.8 12/19/2014 1103   CREATININE 0.90 08/25/2006 1353      Component Value Date/Time   CALCIUM 9.0 12/19/2014 1103   CALCIUM 9.6 08/25/2006 1353   ALKPHOS 44 12/19/2014 1103   ALKPHOS 45 08/25/2006 1353   AST 18 12/19/2014 1103  AST 18 08/25/2006 1353   ALT 14 12/19/2014 1103   ALT 17 08/25/2006 1353   BILITOT 1.43* 12/19/2014 1103   BILITOT 1.2 08/25/2006 1353       RADIOGRAPHIC STUDIES: Ct Renal Stone Study  08/12/2015  CLINICAL DATA:  RIGHT flank pain for 40 hours, microscopic hematuria, nausea, prior history of kidney stones EXAM: CT ABDOMEN AND PELVIS WITHOUT CONTRAST TECHNIQUE: Multidetector CT  imaging of the abdomen and pelvis was performed following the standard protocol without IV contrast. Sagittal and coronal MPR images reconstructed from axial data set. Oral contrast not administered for this indication. COMPARISON:  07/06/2014 FINDINGS: Minimal bibasilar atelectasis. Dependent calcified gallstones in gallbladder. Multiple tiny BILATERAL nonobstructing renal calculi. No definite hydronephrosis, ureteral calcification or ureteral dilatation. Question few small peripelvic cysts LEFT kidney. Bladder adequately distended with minimal wall thickening and 2 small LEFT side bladder diverticula, located anteriorly and posteriorly. Prostatic enlargement, gland 5.5 x 4.6 x 5.1 cm. Questionable tiny nonobstructing calculus at the RIGHT ureterovesical junction sagittal image 92. Multiple hepatic cysts, largest 4.6 x 3.8 cm. Liver, spleen, pancreas, kidneys, and adrenal glands otherwise unremarkable. Small RIGHT inguinal hernia containing fat. Normal appendix. Stomach and small bowel loops grossly normal appearance for technique. No mass, adenopathy, free air or free fluid. Severe multilevel multifactorial central acquired spinal stenosis and multilevel neural foraminal stenosis in lumbar spine. IMPRESSION: Tiny BILATERAL nonobstructing renal calculi. Questionable tiny nonobstructing calculus at RIGHT ureterovesical junction. Cholelithiasis. Prostatic enlargement with minimal bladder wall thickening and into LEFT-sided bladder diverticula. Hepatic and question peripelvic LEFT renal cysts. Electronically Signed   By: Lavonia Dana M.D.   On: 08/12/2015 12:57    ASSESSMENT AND PLAN:  This is a very pleasant 81 years old white male with persistent anemia questionable for anemia of chronic disease versus myelodysplastic syndrome. His CBC today showed significant leukocytopenia and neutropenia in addition to his baseline anemia and thrombocytopenia. I discussed with the patient proceeding with a bone marrow biopsy  and aspirate for evaluation of his condition and to rule out any underlying hematologic disorder of the bone marrow. The patient agreed to proceed with the bone marrow biopsy. I referred him to interventional radiology for CT-guided bone marrow biopsy and aspirate. I would see him back for follow-up visit in 2 weeks for reevaluation and discussion of his biopsy results and further recommendation regarding his condition. For the right flank pain, the patient will continue his current treatment with the muscle relaxant and pain medication as prescribed by his urologist. He was advised to call immediately if he has any significant fever or chills or any other signs of infection during this period.  He was advised to call immediately if he has any concerning symptoms in the interval. The patient voices understanding of current disease status and treatment options and is in agreement with the current care plan.  All questions were answered. The patient knows to call the clinic with any problems, questions or concerns. We can certainly see the patient much sooner if necessary.  Disclaimer: This note was dictated with voice recognition software. Similar sounding words can inadvertently be transcribed and may not be corrected upon review.

## 2015-08-27 NOTE — Telephone Encounter (Signed)
I left message to call back with pt pain med and muscle relaxant.

## 2015-08-27 NOTE — Addendum Note (Signed)
Addended by: Ardeen Garland on: 08/27/2015 01:07 PM   Modules accepted: Medications

## 2015-08-27 NOTE — Telephone Encounter (Signed)
per pof to sch pt appt-gave pt copy of avs-adv [facility] will clal to sch CT

## 2015-09-03 ENCOUNTER — Other Ambulatory Visit: Payer: Self-pay | Admitting: Radiology

## 2015-09-04 ENCOUNTER — Ambulatory Visit (HOSPITAL_COMMUNITY)
Admission: RE | Admit: 2015-09-04 | Discharge: 2015-09-04 | Disposition: A | Discharge status: Home / Self Care | Payer: Medicare Other | Source: Ambulatory Visit | Attending: Internal Medicine | Admitting: Internal Medicine

## 2015-09-04 ENCOUNTER — Ambulatory Visit (HOSPITAL_COMMUNITY): Payer: Medicare Other

## 2015-09-04 ENCOUNTER — Encounter (HOSPITAL_COMMUNITY): Payer: Self-pay

## 2015-09-04 DIAGNOSIS — E039 Hypothyroidism, unspecified: Secondary | ICD-10-CM | POA: Insufficient documentation

## 2015-09-04 DIAGNOSIS — D72819 Decreased white blood cell count, unspecified: Secondary | ICD-10-CM | POA: Insufficient documentation

## 2015-09-04 DIAGNOSIS — Z800 Family history of malignant neoplasm of digestive organs: Secondary | ICD-10-CM | POA: Diagnosis not present

## 2015-09-04 DIAGNOSIS — D649 Anemia, unspecified: Secondary | ICD-10-CM | POA: Diagnosis present

## 2015-09-04 DIAGNOSIS — Z885 Allergy status to narcotic agent status: Secondary | ICD-10-CM | POA: Diagnosis not present

## 2015-09-04 DIAGNOSIS — H9190 Unspecified hearing loss, unspecified ear: Secondary | ICD-10-CM | POA: Diagnosis not present

## 2015-09-04 DIAGNOSIS — Z79899 Other long term (current) drug therapy: Secondary | ICD-10-CM | POA: Diagnosis not present

## 2015-09-04 DIAGNOSIS — D539 Nutritional anemia, unspecified: Secondary | ICD-10-CM | POA: Insufficient documentation

## 2015-09-04 DIAGNOSIS — D693 Immune thrombocytopenic purpura: Secondary | ICD-10-CM | POA: Insufficient documentation

## 2015-09-04 DIAGNOSIS — D709 Neutropenia, unspecified: Secondary | ICD-10-CM

## 2015-09-04 HISTORY — DX: Unspecified hearing loss, unspecified ear: H91.90

## 2015-09-04 HISTORY — DX: Personal history of diseases of the blood and blood-forming organs and certain disorders involving the immune mechanism: Z86.2

## 2015-09-04 HISTORY — DX: Adverse effect of unspecified anesthetic, initial encounter: T41.45XA

## 2015-09-04 HISTORY — DX: Reserved for inherently not codable concepts without codable children: IMO0001

## 2015-09-04 HISTORY — DX: Hypothyroidism, unspecified: E03.9

## 2015-09-04 HISTORY — DX: Other complications of anesthesia, initial encounter: T88.59XA

## 2015-09-04 HISTORY — DX: Anemia, unspecified: D64.9

## 2015-09-04 LAB — PROTIME-INR
INR: 1.35 (ref 0.00–1.49)
PROTHROMBIN TIME: 16.8 s — AB (ref 11.6–15.2)

## 2015-09-04 LAB — CBC
HCT: 30.5 % — ABNORMAL LOW (ref 39.0–52.0)
Hemoglobin: 10.2 g/dL — ABNORMAL LOW (ref 13.0–17.0)
MCH: 34.6 pg — AB (ref 26.0–34.0)
MCHC: 33.4 g/dL (ref 30.0–36.0)
MCV: 103.4 fL — ABNORMAL HIGH (ref 78.0–100.0)
Platelets: 141 10*3/uL — ABNORMAL LOW (ref 150–400)
RBC: 2.95 MIL/uL — ABNORMAL LOW (ref 4.22–5.81)
RDW: 20.6 % — AB (ref 11.5–15.5)
WBC: 2.1 10*3/uL — ABNORMAL LOW (ref 4.0–10.5)

## 2015-09-04 LAB — APTT: APTT: 38 s — AB (ref 24–37)

## 2015-09-04 LAB — BONE MARROW EXAM

## 2015-09-04 MED ORDER — SODIUM CHLORIDE 0.9 % IV SOLN
Freq: Once | INTRAVENOUS | Status: AC
  Administered 2015-09-04: 07:00:00 via INTRAVENOUS

## 2015-09-04 MED ORDER — MIDAZOLAM HCL 2 MG/2ML IJ SOLN
INTRAMUSCULAR | Status: AC
  Filled 2015-09-04: qty 4

## 2015-09-04 MED ORDER — FENTANYL CITRATE (PF) 100 MCG/2ML IJ SOLN
INTRAMUSCULAR | Status: AC | PRN
  Administered 2015-09-04: 50 ug via INTRAVENOUS
  Administered 2015-09-04: 25 ug via INTRAVENOUS

## 2015-09-04 MED ORDER — MIDAZOLAM HCL 2 MG/2ML IJ SOLN
INTRAMUSCULAR | Status: AC | PRN
  Administered 2015-09-04: 0.5 mg via INTRAVENOUS
  Administered 2015-09-04: 1 mg via INTRAVENOUS

## 2015-09-04 MED ORDER — FENTANYL CITRATE (PF) 100 MCG/2ML IJ SOLN
INTRAMUSCULAR | Status: AC
  Filled 2015-09-04: qty 2

## 2015-09-04 NOTE — Discharge Instructions (Signed)
Bone Marrow Aspiration and Bone Marrow Biopsy, Care After Refer to this sheet in the next few weeks. These instructions provide you with information about caring for yourself after your procedure. Your health care provider may also give you more specific instructions. Your treatment has been planned according to current medical practices, but problems sometimes occur. Call your health care provider if you have any problems or questions after your procedure. WHAT TO EXPECT AFTER THE PROCEDURE After your procedure, it is common to have:  Soreness or tenderness around the puncture site.  Bruising. HOME CARE INSTRUCTIONS  Take medicines only as directed by your health care provider.  Follow your health care provider's instructions about:  Puncture site care.  Bandage (dressing) changes and removal.  Bathe and shower as directed by your health care provider.  Check your puncture site every day for signs of infection. Watch for:  Redness, swelling, or pain.  Fluid, blood, or pus.  Return to your normal activities as directed by your health care provider.  Keep all follow-up visits as directed by your health care provider. This is important. SEEK MEDICAL CARE IF:  You have a fever.  You have uncontrollable bleeding.  You have redness, swelling, or pain at the site of your puncture.  You have fluid, blood, or pus coming from your puncture site.   This information is not intended to replace advice given to you by your health care provider. Make sure you discuss any questions you have with your health care provider.   Document Released: 04/03/2005 Document Revised: 01/29/2015 Document Reviewed: 09/05/2014 Elsevier Interactive Patient Education 2016 [employer]. Bone Marrow Aspiration and Bone Marrow Biopsy Bone marrow aspiration and bone marrow biopsy are procedures that are done to diagnose blood disorders. You may also have one of these procedures to help diagnose infections or  some types of cancer. Bone marrow is the soft tissue that is inside your bones. Blood cells are produced in bone marrow. For bone marrow aspiration, a sample of tissue in liquid form is removed from inside your bone. For a bone marrow biopsy, a small core of bone marrow tissue is removed. Then these samples are examined under a microscope or tested in a lab. You may need these procedures if you have an abnormal complete blood count (CBC). The aspiration or biopsy sample is usually taken from the top of your hip bone. Sometimes, an aspiration sample is taken from your chest bone (sternum). LET [facility] CARE PROVIDER KNOW ABOUT:  Any allergies you have.  All medicines you are taking, including vitamins, herbs, eye drops, creams, and over-the-counter medicines.  Previous problems you or members of your family have had with the use of anesthetics.  Any blood disorders you have.  Previous surgeries you have had.  Any medical conditions you may have.  Whether you are pregnant or you think that you may be pregnant. RISKS AND COMPLICATIONS Generally, this is a safe procedure. However, problems may occur, including:  Infection.  Bleeding. BEFORE THE PROCEDURE  Ask your health care provider about:  Changing or stopping your regular medicines. This is especially important if you are taking diabetes medicines or blood thinners.  Taking medicines such as aspirin and ibuprofen. These medicines can thin your blood. Do not take these medicines before your procedure if your health care provider instructs you not to.  Plan to have someone take you home after the procedure.  If you go home right after the procedure, plan to have someone with you  for 24 hours. °PROCEDURE  °· An IV tube may be inserted into one of your veins. °· The injection site will be cleaned with a germ-killing solution (antiseptic). °· You will be given one or more of the following: °· A medicine that helps you relax  (sedative). °· A medicine that numbs the area (local anesthetic). °· The bone marrow sample will be removed as follows: °· For an aspiration, a hollow needle will be inserted through your skin and into your bone. Bone marrow fluid will be drawn up into a syringe. °· For a biopsy, your health care provider will use a hollow needle to remove a core of tissue from your bone marrow. °· The needle will be removed. °· A bandage (dressing) will be placed over the insertion site and taped in place. °The procedure may vary among health care providers and hospitals. °AFTER THE PROCEDURE °· Your blood pressure, heart rate, breathing rate, and blood oxygen level will be monitored often until the medicines you were given have worn off. °· Return to your normal activities as directed by your health care provider. °  °This information is not intended to replace advice given to you by your health care provider. Make sure you discuss any questions you have with your health care provider. °  °Document Released: 09/17/2004 Document Revised: 01/29/2015 Document Reviewed: 09/05/2014 °Elsevier Interactive Patient Education ©2016 [employer] °Moderate Conscious Sedation, Adult, Care After °Refer to this sheet in the next few weeks. These instructions provide you with information on caring for yourself after your procedure. Your health care provider may also give you more specific instructions. Your treatment has been planned according to current medical practices, but problems sometimes occur. Call your health care provider if you have any problems or questions after your procedure. °WHAT TO EXPECT AFTER THE PROCEDURE  °After your procedure: °· You may feel sleepy, clumsy, and have poor balance for several hours. °· Vomiting may occur if you eat too soon after the procedure. °HOME CARE INSTRUCTIONS °· Do not participate in any activities where you could become injured for at least 24 hours. Do not: °¨ Drive. °¨ Swim. °¨ Ride a  bicycle. °¨ Operate heavy machinery. °¨ Cook. °¨ Use power tools. °¨ Climb ladders. °¨ Work from a high place. °· Do not make important decisions or sign legal documents until you are improved. °· If you vomit, drink water, juice, or soup when you can drink without vomiting. Make sure you have little or no nausea before eating solid foods. °· Only take over-the-counter or prescription medicines for pain, discomfort, or fever as directed by your health care provider. °· Make sure you and your family fully understand everything about the medicines given to you, including what side effects may occur. °· You should not drink alcohol, take sleeping pills, or take medicines that cause drowsiness for at least 24 hours. °· If you smoke, do not smoke without supervision. °· If you are feeling better, you may resume normal activities 24 hours after you were sedated. °· Keep all appointments with your health care provider. °SEEK MEDICAL CARE IF: °· Your skin is pale or bluish in color. °· You continue to feel nauseous or vomit. °· Your pain is getting worse and is not helped by medicine. °· You have bleeding or swelling. °· You are still sleepy or feeling clumsy after 24 hours. °SEEK IMMEDIATE MEDICAL CARE IF: °· You develop a rash. °· You have difficulty breathing. °· You develop any type of   allergic problem. °· You have a fever. °MAKE SURE YOU: °· Understand these instructions. °· Will watch your condition. °· Will get help right away if you are not doing well or get worse. °  °This information is not intended to replace advice given to you by your health care provider. Make sure you discuss any questions you have with your health care provider. °  °Document Released: 07/05/2013 Document Revised: 10/05/2014 Document Reviewed: 07/05/2013 °Elsevier Interactive Patient Education ©2016 Elsevier Inc. ° °

## 2015-09-04 NOTE — Procedures (Signed)
Technically successful CT guided bone marrow aspiration and biopsy of left iliac crest. No immediate complications.    SignedSandi Mariscal [id number] 09/04/2015, 9:44 AM

## 2015-09-04 NOTE — H&P (Signed)
Chief Complaint: Patient was seen in consultation today for bone marrow biopsy at the request of [facility]  Referring Physician(s): Mohamed,Mohamed  History of Present Illness: Dean Stanley is a 81 y.o. male with chronic anemia and leukopenia. He has hx of ITP also. He is referred for bone marrow biopsy. PMHx, meds, labs reviewed. Has been NPO this am  Past Medical History  Diagnosis Date  . Meniere's syndrome 1980s  . History of ITP 1960  . Anemia   . Complication of anesthesia     Reaction to anesthesia " things went haywire around me"  . Hypothyroidism   . Hearing impairment     wears hearing left ear    Past Surgical History  Procedure Laterality Date  . Colonoscopy    . Cortisone injections to back x 6    . Circumcision    . Cataract Right 2011  . Orif orbital fracture Left 2000    2 fractures orbital platform    Allergies: Hydrocodone and Oxycodone  Medications: Prior to Admission medications   Medication Sig Start Date End Date Taking? Authorizing Provider  desmopressin (DDAVP) 0.2 MG tablet Take 0.2 mg by mouth daily.   Yes Historical Provider, MD  ferrous sulfate 325 (65 FE) MG tablet Take 325 mg by mouth daily with breakfast.   Yes Historical Provider, MD  finasteride (PROPECIA) 1 MG tablet Take 1 mg by mouth daily.   Yes Historical Provider, MD  levothyroxine (SYNTHROID, LEVOTHROID) 200 MCG tablet Take 200 mcg by mouth daily.   Yes Historical Provider, MD  meloxicam (MOBIC) 7.5 MG tablet Take 7.5 mg by mouth daily.   Yes Irine Seal, MD  Multiple Vitamins-Minerals (CENTRUM ADULTS PO) Take 1 each by mouth.   Yes Historical Provider, MD     Family History  Problem Relation Age of Onset  . Pancreatic cancer Father   . Colon cancer Brother   . Throat cancer Other     Social History   Social History  . Marital Status: Married    Spouse Name: N/A  . Number of Children: N/A  . Years of Education: N/A   Social History Main Topics    . Smoking status: Never Smoker   . Smokeless tobacco: None  . Alcohol Use: Yes     Comment: Occasional  . Drug Use: No  . Sexual Activity: Not Currently   Other Topics Concern  . None   Social History Narrative    Review of Systems: A 12 point ROS discussed and pertinent positives are indicated in the HPI above.  All other systems are negative.  Review of Systems  Vital Signs: Temp(Src) 98.1 F (36.7 C) (Oral)  Physical Exam  Constitutional: He is oriented to person, place, and time. He appears well-developed and well-nourished. No distress.  HENT:  Head: Normocephalic.  Mouth/Throat: Oropharynx is clear and moist.  Neck: Normal range of motion. No tracheal deviation present.  Cardiovascular: Normal rate, regular rhythm and normal heart sounds.   Pulmonary/Chest: Effort normal and breath sounds normal. No respiratory distress.  Neurological: He is alert and oriented to person, place, and time.  Skin: Skin is warm and dry. No rash noted.  Psychiatric: He has a normal mood and affect. Judgment normal.    Mallampati Score:  MD Evaluation Airway: WNL Heart: WNL Abdomen: WNL Chest/ Lungs: WNL ASA  Classification: 2 Mallampati/Airway Score: One  Imaging: Ct Renal Stone Study  08/12/2015  CLINICAL DATA:  RIGHT flank pain for 40 hours, microscopic  hematuria, nausea, prior history of kidney stones EXAM: CT ABDOMEN AND PELVIS WITHOUT CONTRAST TECHNIQUE: Multidetector CT imaging of the abdomen and pelvis was performed following the standard protocol without IV contrast. Sagittal and coronal MPR images reconstructed from axial data set. Oral contrast not administered for this indication. COMPARISON:  07/06/2014 FINDINGS: Minimal bibasilar atelectasis. Dependent calcified gallstones in gallbladder. Multiple tiny BILATERAL nonobstructing renal calculi. No definite hydronephrosis, ureteral calcification or ureteral dilatation. Question few small peripelvic cysts LEFT kidney. Bladder  adequately distended with minimal wall thickening and 2 small LEFT side bladder diverticula, located anteriorly and posteriorly. Prostatic enlargement, gland 5.5 x 4.6 x 5.1 cm. Questionable tiny nonobstructing calculus at the RIGHT ureterovesical junction sagittal image 92. Multiple hepatic cysts, largest 4.6 x 3.8 cm. Liver, spleen, pancreas, kidneys, and adrenal glands otherwise unremarkable. Small RIGHT inguinal hernia containing fat. Normal appendix. Stomach and small bowel loops grossly normal appearance for technique. No mass, adenopathy, free air or free fluid. Severe multilevel multifactorial central acquired spinal stenosis and multilevel neural foraminal stenosis in lumbar spine. IMPRESSION: Tiny BILATERAL nonobstructing renal calculi. Questionable tiny nonobstructing calculus at RIGHT ureterovesical junction. Cholelithiasis. Prostatic enlargement with minimal bladder wall thickening and into LEFT-sided bladder diverticula. Hepatic and question peripelvic LEFT renal cysts. Electronically Signed   By: Lavonia Dana M.D.   On: 08/12/2015 12:57    Labs:  CBC:  Recent Labs  12/19/14 1103 02/18/15 1059 08/20/15 1051 09/04/15 0720  WBC 4.1 5.8 2.1* 2.1*  HGB 11.8* 11.8* 10.4* 10.2*  HCT 36.7* 35.6* 31.3* 30.5*  PLT 132* 125* 130* 141*    COAGS:  Recent Labs  09/04/15 0720  INR 1.35  APTT 38*    BMP:  Recent Labs  12/19/14 1103  NA 140  K 4.5  CO2 25  GLUCOSE 90  BUN 12.1  CALCIUM 9.0  CREATININE 0.8    LIVER FUNCTION TESTS:  Recent Labs  12/19/14 1103  BILITOT 1.43*  AST 18  ALT 14  ALKPHOS 44  PROT 7.4  ALBUMIN 4.3    Assessment and Plan: Anemia For CT guided bone marrow biopsy Labs ok Risks and Benefits discussed with the patient including, but not limited to bleeding, infection, damage to adjacent structures or low yield requiring additional tests. All of the patient's questions were answered, patient is agreeable to proceed. Consent signed and in  chart.    Thank you for this interesting consult.  A copy of this report was sent to the requesting provider on this date.  SignedAscencion Dike 09/04/2015, 8:34 AM   I spent a total of 15 minutes in face to face in clinical consultation, greater than 50% of which was counseling/coordinating care for bone marrow biopsy

## 2015-09-09 ENCOUNTER — Ambulatory Visit (HOSPITAL_COMMUNITY): Payer: Medicare Other

## 2015-09-10 ENCOUNTER — Other Ambulatory Visit (HOSPITAL_BASED_OUTPATIENT_CLINIC_OR_DEPARTMENT_OTHER): Payer: Medicare Other

## 2015-09-10 ENCOUNTER — Encounter: Payer: Self-pay | Admitting: Internal Medicine

## 2015-09-10 ENCOUNTER — Ambulatory Visit (HOSPITAL_BASED_OUTPATIENT_CLINIC_OR_DEPARTMENT_OTHER): Payer: Medicare Other | Admitting: Internal Medicine

## 2015-09-10 ENCOUNTER — Telehealth: Payer: Self-pay

## 2015-09-10 ENCOUNTER — Ambulatory Visit: Payer: Medicare Other

## 2015-09-10 ENCOUNTER — Telehealth: Payer: Self-pay | Admitting: Internal Medicine

## 2015-09-10 VITALS — BP 106/58 | HR 78 | Temp 98.3°F | Resp 18 | Ht 72.0 in | Wt 180.0 lb

## 2015-09-10 DIAGNOSIS — D469 Myelodysplastic syndrome, unspecified: Secondary | ICD-10-CM

## 2015-09-10 DIAGNOSIS — D539 Nutritional anemia, unspecified: Secondary | ICD-10-CM

## 2015-09-10 DIAGNOSIS — D708 Other neutropenia: Secondary | ICD-10-CM

## 2015-09-10 DIAGNOSIS — D4621 Refractory anemia with excess of blasts 1: Secondary | ICD-10-CM

## 2015-09-10 DIAGNOSIS — D72819 Decreased white blood cell count, unspecified: Secondary | ICD-10-CM

## 2015-09-10 DIAGNOSIS — D709 Neutropenia, unspecified: Secondary | ICD-10-CM

## 2015-09-10 LAB — CBC WITH DIFFERENTIAL/PLATELET
BASO%: 0.4 % (ref 0.0–2.0)
BASOS ABS: 0 10*3/uL (ref 0.0–0.1)
EOS%: 0 % (ref 0.0–7.0)
Eosinophils Absolute: 0 10*3/uL (ref 0.0–0.5)
HCT: 29 % — ABNORMAL LOW (ref 38.4–49.9)
HEMOGLOBIN: 9.7 g/dL — AB (ref 13.0–17.1)
LYMPH#: 1.4 10*3/uL (ref 0.9–3.3)
LYMPH%: 60.6 % — ABNORMAL HIGH (ref 14.0–49.0)
MCH: 34.6 pg — ABNORMAL HIGH (ref 27.2–33.4)
MCHC: 33.4 g/dL (ref 32.0–36.0)
MCV: 103.6 fL — AB (ref 79.3–98.0)
MONO#: 0.4 10*3/uL (ref 0.1–0.9)
MONO%: 18.6 % — AB (ref 0.0–14.0)
NEUT%: 20.4 % — ABNORMAL LOW (ref 39.0–75.0)
NEUTROS ABS: 0.5 10*3/uL — AB (ref 1.5–6.5)
NRBC: 3 % — AB (ref 0–0)
Platelets: 96 10*3/uL — ABNORMAL LOW (ref 140–400)
RBC: 2.8 10*6/uL — AB (ref 4.20–5.82)
RDW: 20.9 % — AB (ref 11.0–14.6)
WBC: 2.3 10*3/uL — AB (ref 4.0–10.3)

## 2015-09-10 LAB — LACTATE DEHYDROGENASE: LDH: 167 U/L (ref 125–245)

## 2015-09-10 NOTE — Telephone Encounter (Signed)
Gave and printed appt sched and avs for pt; for DEC and Jan  °

## 2015-09-10 NOTE — Telephone Encounter (Signed)
Dean Stanley was added in to injection schedule for neupogen and granix Pt. Needed Prior authorization for medication appointment rescheduled

## 2015-09-10 NOTE — Progress Notes (Signed)
[place] [phone]   Fax:([phone]) 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  INTERVAL HISTORY: Dean Stanley 81 y.o. male returns to the clinic today for follow-up visit. The patient is feeling fine today with no specific complaints except for fatigue. He has no fever or chills.  He has no bleeding issues. He denied having any significant chest pain, shortness breath, cough or hemoptysis. The patient denied having any weight loss or night sweats. He has no nausea or vomiting. He recently underwent CT-guided bone marrow biopsy and aspirate and the patient is here today for evaluation and discussion of the biopsy results and treatment options.  MEDICAL HISTORY: Past Medical History  Diagnosis Date  . Meniere's syndrome 1980s  . History of ITP 1960  . Anemia   . Complication of anesthesia     Reaction to anesthesia " things went haywire around me"  . Hypothyroidism   . Hearing impairment     wears hearing left ear    ALLERGIES:  is allergic to hydrocodone and oxycodone.  MEDICATIONS:  Current Outpatient Prescriptions  Medication Sig Dispense Refill  . desmopressin (DDAVP) 0.2 MG tablet Take 0.2 mg by mouth daily.    . ferrous sulfate 325 (65 FE) MG tablet Take 325 mg by mouth daily with breakfast.    . finasteride (PROPECIA) 1 MG tablet Take 1 mg by mouth daily.    Marland Kitchen levothyroxine (SYNTHROID, LEVOTHROID) 200 MCG tablet Take 200 mcg by mouth daily.    . meloxicam (MOBIC) 7.5 MG tablet Take 7.5 mg by mouth daily.    . Multiple Vitamins-Minerals (CENTRUM ADULTS PO) Take 1  each by mouth.     No current facility-administered medications for this visit.    SURGICAL HISTORY:  Past Surgical History  Procedure Laterality Date  . Colonoscopy    . Cortisone injections to back x 6    . Circumcision    . Cataract Right 2011  . Orif orbital fracture Left 2000    2 fractures orbital platform    REVIEW OF SYSTEMS:  Constitutional: positive for fatigue Eyes: negative Ears, nose, mouth, throat, and face: negative Respiratory: negative Cardiovascular: negative Gastrointestinal: negative Genitourinary:negative Integument/breast: negative Hematologic/lymphatic: negative Musculoskeletal:negative Neurological: negative Behavioral/Psych: negative Endocrine: negative Allergic/Immunologic: negative   PHYSICAL EXAMINATION: General appearance: alert, cooperative, fatigued and no distress Head: Normocephalic, without obvious abnormality, atraumatic Neck: no adenopathy, no JVD, supple, symmetrical, trachea midline and thyroid not enlarged, symmetric, no tenderness/mass/nodules Lymph nodes: Cervical, supraclavicular, and axillary nodes normal. Resp: clear to auscultation bilaterally Back: symmetric, no curvature. ROM normal. No CVA tenderness. Cardio: regular rate and rhythm, S1, S2 normal, no murmur, click, rub or gallop GI: soft, non-tender; bowel sounds normal; no masses,  no organomegaly Extremities: extremities normal, atraumatic, no cyanosis or edema Neurologic: Alert and oriented X 3, normal strength and tone. Normal symmetric reflexes. Normal coordination and gait  ECOG PERFORMANCE STATUS: 1 - Symptomatic but completely ambulatory  Blood pressure 106/58, pulse 78, temperature 98.3 F (36.8 C), temperature source Oral, resp. rate 18, height 6' (1.829 m), weight 180 lb (81.647 kg), SpO2 99 %.  LABORATORY DATA:  Lab Results  Component Value Date   WBC 2.3* 09/10/2015   HGB 9.7* 09/10/2015   HCT 29.0* 09/10/2015   MCV 103.6* 09/10/2015   PLT 96*  09/10/2015      Chemistry      Component Value Date/Time   NA 140 12/19/2014 1103   NA 141 08/25/2006 1353   K 4.5 12/19/2014 1103   K 4.4 08/25/2006 1353   CL 104 08/25/2006 1353   CO2 25 12/19/2014 1103   CO2 29 08/25/2006 1353   BUN 12.1 12/19/2014 1103   BUN 14 08/25/2006 1353   CREATININE 0.8 12/19/2014 1103   CREATININE 0.90 08/25/2006 1353      Component Value Date/Time   CALCIUM 9.0 12/19/2014 1103   CALCIUM 9.6 08/25/2006 1353   ALKPHOS 44 12/19/2014 1103   ALKPHOS 45 08/25/2006 1353   AST 18 12/19/2014 1103   AST 18 08/25/2006 1353   ALT 14 12/19/2014 1103   ALT 17 08/25/2006 1353   BILITOT 1.43* 12/19/2014 1103   BILITOT 1.2 08/25/2006 1353       RADIOGRAPHIC STUDIES: Ct Biopsy  09/04/2015  INDICATION: History of anemia, leukopenia and ITP. Please perform CT-guided bone marrow biopsy and aspiration for tissue diagnostic purposes. EXAM: CT-GUIDED BONE MARROW BIOPSY AND ASPIRATION MEDICATIONS: Fentanyl 75 mcg IV; Versed 1.5 mg IV ANESTHESIA/SEDATION: Sedation Time 4 minutes CONTRAST:  None COMPLICATIONS: None immediate. PROCEDURE: Informed consent was obtained from the patient following an explanation of the procedure, risks, benefits and alternatives. The patient understands, agrees and consents for the procedure. All questions were addressed. A time out was performed prior to the initiation of the procedure. The patient was positioned prone and non-contrast localization CT was performed of the pelvis to demonstrate the iliac marrow spaces. The operative site was prepped and draped in the usual sterile fashion. Under sterile conditions and local anesthesia, a 22 gauge spinal needle was utilized for procedural planning. Next, an 11 gauge coaxial bone biopsy needle was advanced into the left iliac marrow space. Needle position was confirmed with CT imaging. Initially, bone marrow aspiration was performed. Next, a bone marrow biopsy was obtained with the 11 gauge outer bone  marrow device. The 11 gauge coaxial bone biopsy needle was re-advanced into a slightly different location within the left iliac marrow space, positioning was confirmed and an additional bone marrow biopsy was obtained. Samples were prepared with the cytotechnologist and deemed adequate. The needle was removed intact. Hemostasis was obtained with compression and a dressing was placed. The patient tolerated the procedure well without immediate post procedural complication. IMPRESSION: Successful CT guided left iliac bone marrow aspiration and core biopsies. Electronically Signed   By: Sandi Mariscal M.D.   On: 09/04/2015 12:14   Ct Renal Stone Study  08/12/2015  CLINICAL DATA:  RIGHT flank pain for 40 hours, microscopic hematuria, nausea, prior history of kidney stones EXAM: CT ABDOMEN AND PELVIS WITHOUT CONTRAST TECHNIQUE: Multidetector CT imaging of the abdomen and pelvis was performed following the standard protocol without IV contrast. Sagittal and coronal MPR images reconstructed from axial data set. Oral contrast not administered for this indication. COMPARISON:  07/06/2014 FINDINGS: Minimal bibasilar atelectasis. Dependent calcified gallstones in gallbladder. Multiple tiny BILATERAL nonobstructing renal calculi. No definite hydronephrosis, ureteral calcification or ureteral dilatation. Question few small peripelvic cysts LEFT kidney. Bladder adequately distended with minimal wall thickening and 2 small LEFT side bladder diverticula, located anteriorly and posteriorly. Prostatic enlargement, gland 5.5 x 4.6 x 5.1 cm. Questionable tiny nonobstructing calculus at  the RIGHT ureterovesical junction sagittal image 92. Multiple hepatic cysts, largest 4.6 x 3.8 cm. Liver, spleen, pancreas, kidneys, and adrenal glands otherwise unremarkable. Small RIGHT inguinal hernia containing fat. Normal appendix. Stomach and small bowel loops grossly normal appearance for technique. No mass, adenopathy, free air or free fluid.  Severe multilevel multifactorial central acquired spinal stenosis and multilevel neural foraminal stenosis in lumbar spine. IMPRESSION: Tiny BILATERAL nonobstructing renal calculi. Questionable tiny nonobstructing calculus at RIGHT ureterovesical junction. Cholelithiasis. Prostatic enlargement with minimal bladder wall thickening and into LEFT-sided bladder diverticula. Hepatic and question peripelvic LEFT renal cysts. Electronically Signed   By: Lavonia Dana M.D.   On: 08/12/2015 12:57   Patient: STEFEN, JUBA DAVID Collected: 09/04/2015 Client: [facility] Accession: [id number] Received: 09/04/2015 John "Ronny Bacon, MD DOB: 11-13-1930 Age: 52 Gender: M Reported: 09/06/2015 [address] Patient Ph: [phone] MRN #: [id number] [place], [place] [address] Visit #: [id number] Chart #: Phone: [phone] Fax: CC: Curt Bears, MD BONE MARROW REPORT FINAL DIAGNOSIS Diagnosis Bone Marrow, Aspirate,Biopsy, and Clot - HYPERCELLULAR BONE MARROW WITH A MYELODYSPLASTIC STATE PERIPHERAL BLOOD: - MACROCYTIC ANEMIA. - LEUKOPENIA Diagnosis Note The bone marrow is hypercellular with dyspoietic changes involving all myeloid cell lines associated with numerous ring sideroblasts. The blastic component shows borderline increase (6%) by morphology associated with increased number of CD34-positive mononuclear cells as seen by immunohistochemistry . Flow cytometric analysis, however, failed to show a significant blastic population possibly due to sampling. Given the overall findings, the changes are most consistent with refractory anemia with excess blasts (RAEB-1). Correlation with cytogenetic studies is recommended. 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 The  cytogenetics is still pending. I had a lengthy discussion with the patient today about his current disease status and treatment options. I recommended for the patient to continue on supportive care for now until the cytogenetics are available. For the refractory anemia, I would consider the patient for treatment with Aranesp 300 g subcutaneously every 3 weeks. For the neutropenia, I will start the patient on Granix 300 g subcutaneously for 2 days. We will continue to monitor his CBC regularly with the Aranesp injection and I would consider the patient for more treatment with Granix If he has any persistent neutropenia. After completion of the cytogenetics, I would evaluate the patient for the international prognostic index and discussed with him other treatment options as needed. I will see the patient back for follow-up visit in 3 weeks for reevaluation. He was advised to call immediately if he has any significant fever or chills or any other signs of infection during this period.  He was advised to call immediately if he has any concerning symptoms in the interval. The patient voices understanding of current disease status and treatment options and is in agreement with the current care plan.  All questions were answered. The patient knows to call the clinic with any problems, questions or concerns. We can certainly see the patient much sooner if necessary.  Disclaimer: This note was dictated with voice recognition software. Similar sounding words can inadvertently be transcribed and may not be corrected upon review.

## 2015-09-11 ENCOUNTER — Ambulatory Visit: Payer: Medicare Other

## 2015-09-12 ENCOUNTER — Telehealth: Payer: Self-pay | Admitting: *Deleted

## 2015-09-12 LAB — CHROMOSOME ANALYSIS, BONE MARROW

## 2015-09-12 NOTE — Telephone Encounter (Signed)
Call from patient's wife Dean Stanley asking "If Medicare has approved his injections.  We need to know to get appointments set.  He wasn't able to receive it yesterday."  Will notify [facility] to call her at [phone].

## 2015-09-16 ENCOUNTER — Ambulatory Visit (HOSPITAL_BASED_OUTPATIENT_CLINIC_OR_DEPARTMENT_OTHER): Payer: Medicare Other

## 2015-09-16 VITALS — BP 125/58 | HR 70 | Temp 98.6°F

## 2015-09-16 DIAGNOSIS — D469 Myelodysplastic syndrome, unspecified: Secondary | ICD-10-CM | POA: Diagnosis present

## 2015-09-16 DIAGNOSIS — D539 Nutritional anemia, unspecified: Secondary | ICD-10-CM | POA: Diagnosis not present

## 2015-09-16 MED ORDER — DARBEPOETIN ALFA 300 MCG/0.6ML IJ SOSY
300.0000 ug | PREFILLED_SYRINGE | Freq: Once | INTRAMUSCULAR | Status: AC
  Administered 2015-09-16: 300 ug via SUBCUTANEOUS
  Filled 2015-09-16: qty 0.6

## 2015-09-16 MED ORDER — TBO-FILGRASTIM 300 MCG/0.5ML ~~LOC~~ SOSY
300.0000 ug | PREFILLED_SYRINGE | Freq: Once | SUBCUTANEOUS | Status: AC
  Administered 2015-09-16: 300 ug via SUBCUTANEOUS
  Filled 2015-09-16: qty 0.5

## 2015-09-16 NOTE — Patient Instructions (Signed)
Neutropenia Neutropenia is a condition that occurs when the level of a certain type of white blood cell (neutrophil) in your body becomes lower than normal. Neutrophils are made in the bone marrow and fight infections. These cells protect against bacteria and viruses. The fewer neutrophils you have, and the longer your body remains without them, the greater your risk of getting a severe infection becomes. CAUSES  The cause of neutropenia may be hard to determine. However, it is usually due to 3 main problems:   Decreased production of neutrophils. This may be due to:  Certain medicines such as chemotherapy.  Genetic problems.  Cancer.  Radiation treatments.  Vitamin deficiency.  Some pesticides.  Increased destruction of neutrophils. This may be due to:  Overwhelming infections.  Hemolytic anemia. This is when the body destroys its own blood cells.  Chemotherapy.  Neutrophils moving to areas of the body where they cannot fight infections. This may be due to:  Dialysis procedures.  Conditions where the spleen becomes enlarged. Neutrophils are held in the spleen and are not available to the rest of the body.  Overwhelming infections. The neutrophils are held in the area of the infection and are not available to the rest of the body. SYMPTOMS  There are no specific symptoms of neutropenia. The lack of neutrophils can result in an infection, and an infection can cause various problems. DIAGNOSIS  Diagnosis is made by a blood test. A complete blood count is performed. The normal level of neutrophils in human blood differs with age and race. Infants have lower counts than older children and adults. African Americans have lower counts than Caucasians or Asians. The average adult level is 1500 cells/mm3 of blood. Neutrophil counts are interpreted as follows:  Greater than 1000 cells/mm3 gives normal protection against infection.  500 to 1000 cells/mm3 gives an increased risk for  infection.  200 to 500 cells/mm3 is a greater risk for severe infection.  Lower than 200 cells/mm3 is a marked risk of infection. This may require hospitalization and treatment with antibiotic medicines. TREATMENT  Treatment depends on the underlying cause, severity, and presence of infections or symptoms. It also depends on your health. Your caregiver will discuss the treatment plan with you. Mild cases are often easily treated and have a good outcome. Preventative measures may also be started to limit your risk of infections. Treatment can include:  Taking antibiotics.  Stopping medicines that are known to cause neutropenia.  Correcting nutritional deficiencies by eating green vegetables to supply folic acid and taking vitamin B supplements.  Stopping exposure to pesticides if your neutropenia is related to pesticide exposure.  Taking a blood growth factor called sargramostim, pegfilgrastim, or filgrastim if you are undergoing chemotherapy for cancer. This stimulates white blood cell production.  Removal of the spleen if you have Felty's syndrome and have repeated infections. HOME CARE INSTRUCTIONS   Follow your caregiver's instructions about when you need to have blood work done.  Wash your hands often. Make sure others who come in contact with you also wash their hands.  Wash raw fruits and vegetables before eating them. They can carry bacteria and fungi.  Avoid people with colds or spreadable (contagious) diseases (chickenpox, herpes zoster, influenza).  Avoid large crowds.  Avoid construction areas. The dust can release fungus into the air.  Be cautious around children in daycare or school environments.  Take care of your respiratory system by coughing and deep breathing.  Bathe daily.  Protect your skin from cuts and   burns.  Do not work in the garden or with flowers and plants.  Care for the mouth before and after meals by brushing with a soft toothbrush. If you have  mucositis, do not use mouthwash. Mouthwash contains alcohol and can dry out the mouth even more.  Clean the area between the genitals and the anus (perineal area) after urination and bowel movements. Women need to wipe from front to back.  Use a water soluble lubricant during sexual intercourse and practice good hygiene after. Do not have intercourse if you are severely neutropenic. Check with your caregiver for guidelines.  Exercise daily as tolerated.  Avoid people who were vaccinated with a live vaccine in the past 30 days. You should not receive live vaccines (polio, typhoid).  Do not provide direct care for pets. Avoid animal droppings. Do not clean litter boxes and bird cages.  Do not share food utensils.  Do not use tampons, enemas, or rectal suppositories unless directed by your caregiver.  Use an electric razor to remove hair.  Wash your hands after handling magazines, letters, and newspapers. SEEK IMMEDIATE MEDICAL CARE IF:   You have a fever.  You have chills or start to shake.  You feel nauseous or vomit.  You develop mouth sores.  You develop aches and pains.  You have redness and swelling around open wounds.  Your skin is warm to the touch.  You have pus coming from your wounds.  You develop swollen lymph nodes.  You feel weak or fatigued.  You develop red streaks on the skin. MAKE SURE YOU:  Understand these instructions.  Will watch your condition.  Will get help right away if you are not doing well or get worse.   This information is not intended to replace advice given to you by your health care provider. Make sure you discuss any questions you have with your health care provider.   Document Released: 03/06/2002 Document Revised: 12/07/2011 Document Reviewed: 03/27/2015 Elsevier Interactive Patient Education 2016 [employer] Tbo-Filgrastim injection What is this medicine? TBO-FILGRASTIM (T B O fil GRA stim) is a granulocyte  colony-stimulating factor that stimulates the growth of neutrophils, a type of white blood cell important in the body's fight against infection. It is used to reduce the incidence of fever and infection in patients with certain types of cancer who are receiving chemotherapy that affects the bone marrow. This medicine may be used for other purposes; ask your health care provider or pharmacist if you have questions. What should I tell my health care provider before I take this medicine? They need to know if you have any of these conditions: -ongoing radiation therapy -sickle cell anemia -an unusual or allergic reaction to tbo-filgrastim, filgrastim, pegfilgrastim, other medicines, foods, dyes, or preservatives -pregnant or trying to get pregnant -breast-feeding How should I use this medicine? This medicine is for injection under the skin. If you get this medicine at home, you will be taught how to prepare and give this medicine. Refer to the Instructions for Use that come with your medication packaging. Use exactly as directed. Take your medicine at regular intervals. Do not take your medicine more often than directed. It is important that you put your used needles and syringes in a special sharps container. Do not put them in a trash can. If you do not have a sharps container, call your pharmacist or healthcare provider to get one. Talk to your pediatrician regarding the use of this medicine in children. Special care may be   needed. Overdosage: If you think you have taken too much of this medicine contact a poison control center or emergency room at once. NOTE: This medicine is only for you. Do not share this medicine with others. What if I miss a dose? It is important not to miss your dose. Call your doctor or health care professional if you miss a dose. What may interact with this medicine? This medicine may interact with the following medications: -medicines that may cause a release of  neutrophils, such as lithium This list may not describe all possible interactions. Give your health care provider a list of all the medicines, herbs, non-prescription drugs, or dietary supplements you use. Also tell them if you smoke, drink alcohol, or use illegal drugs. Some items may interact with your medicine. What should I watch for while using this medicine? You may need blood work done while you are taking this medicine. What side effects may I notice from receiving this medicine? Side effects that you should report to your doctor or health care professional as soon as possible: -allergic reactions like skin rash, itching or hives, swelling of the face, lips, or tongue -shortness of breath or breathing problems -fever -pain, redness, or irritation at site where injected -pinpoint red spots on the skin -stomach or side pain, or pain at the shoulder -swelling -tiredness -trouble passing urine Side effects that usually do not require medical attention (Report these to your doctor or health care professional if they continue or are bothersome.): -bone pain -muscle pain This list may not describe all possible side effects. Call your doctor for medical advice about side effects. You may report side effects to FDA at 1-800-FDA-1088. Where should I keep my medicine? Keep out of the reach of children. Store in a refrigerator between 2 and 8 degrees C (36 and 46 degrees F). Keep in carton to protect from light. Throw away this medicine if it is left out of the refrigerator for more than 5 consecutive days. Throw away any unused medicine after the expiration date. NOTE: This sheet is a summary. It may not cover all possible information. If you have questions about this medicine, talk to your doctor, pharmacist, or health care provider.    2016, Else[employer]. (2014-01-04 11:52:29) Darbepoetin Alfa injection What is this medicine? DARBEPOETIN ALFA (dar be POE e tin AL fa) helps your  body make more red blood cells. It is used to treat anemia caused by chronic kidney failure and chemotherapy. This medicine may be used for other purposes; ask your health care provider or pharmacist if you have questions. What should I tell my health care provider before I take this medicine? They need to know if you have any of these conditions: -blood clotting disorders or history of blood clots -cancer patient not on chemotherapy -cystic fibrosis -heart disease, such as angina, heart failure, or a history of a heart attack -hemoglobin level of 12 g/dL or greater -high blood pressure -low levels of folate, iron, or vitamin B12 -seizures -an unusual or allergic reaction to darbepoetin, erythropoietin, albumin, hamster proteins, latex, other medicines, foods, dyes, or preservatives -pregnant or trying to get pregnant -breast-feeding How should I use this medicine? This medicine is for injection into a vein or under the skin. It is usually given by a health care professional in a hospital or clinic setting. If you get this medicine at home, you will be taught how to prepare and give this medicine. Do not shake the solution before you withdraw  a dose. Use exactly as directed. Take your medicine at regular intervals. Do not take your medicine more often than directed. It is important that you put your used needles and syringes in a special sharps container. Do not put them in a trash can. If you do not have a sharps container, call your pharmacist or healthcare provider to get one. Talk to your pediatrician regarding the use of this medicine in children. While this medicine may be used in children as young as 1 year for selected conditions, precautions do apply. Overdosage: If you think you have taken too much of this medicine contact a poison control center or emergency room at once. NOTE: This medicine is only for you. Do not share this medicine with others. What if I miss a dose? If you miss  a dose, take it as soon as you can. If it is almost time for your next dose, take only that dose. Do not take double or extra doses. What may interact with this medicine? Do not take this medicine with any of the following medications: -epoetin alfa This list may not describe all possible interactions. Give your health care provider a list of all the medicines, herbs, non-prescription drugs, or dietary supplements you use. Also tell them if you smoke, drink alcohol, or use illegal drugs. Some items may interact with your medicine. What should I watch for while using this medicine? Visit your prescriber or health care professional for regular checks on your progress and for the needed blood tests and blood pressure measurements. It is especially important for the doctor to make sure your hemoglobin level is in the desired range, to limit the risk of potential side effects and to give you the best benefit. Keep all appointments for any recommended tests. Check your blood pressure as directed. Ask your doctor what your blood pressure should be and when you should contact him or her. As your body makes more red blood cells, you may need to take iron, folic acid, or vitamin B supplements. Ask your doctor or health care provider which products are right for you. If you have kidney disease continue dietary restrictions, even though this medication can make you feel better. Talk with your doctor or health care professional about the foods you eat and the vitamins that you take. What side effects may I notice from receiving this medicine? Side effects that you should report to your doctor or health care professional as soon as possible: -allergic reactions like skin rash, itching or hives, swelling of the face, lips, or tongue -breathing problems -changes in vision -chest pain -confusion, trouble speaking or understanding -feeling faint or lightheaded, falls -high blood pressure -muscle aches or  pains -pain, swelling, warmth in the leg -rapid weight gain -severe headaches -sudden numbness or weakness of the face, arm or leg -trouble walking, dizziness, loss of balance or coordination -seizures (convulsions) -swelling of the ankles, feet, hands -unusually weak or tired Side effects that usually do not require medical attention (report to your doctor or health care professional if they continue or are bothersome): -diarrhea -fever, chills (flu-like symptoms) -headaches -nausea, vomiting -redness, stinging, or swelling at site where injected This list may not describe all possible side effects. Call your doctor for medical advice about side effects. You may report side effects to FDA at 1-800-FDA-1088. Where should I keep my medicine? Keep out of the reach of children. Store in a refrigerator between 2 and 8 degrees C (36 and 46 degrees F). Do  not freeze. Do not shake. Throw away any unused portion if using a single-dose vial. Throw away any unused medicine after the expiration date. NOTE: This sheet is a summary. It may not cover all possible information. If you have questions about this medicine, talk to your doctor, pharmacist, or health care provider.    2016, [employer]. (2008-08-28 10:23:57)

## 2015-09-17 ENCOUNTER — Ambulatory Visit (HOSPITAL_BASED_OUTPATIENT_CLINIC_OR_DEPARTMENT_OTHER): Payer: Medicare Other

## 2015-09-17 VITALS — BP 122/64 | HR 84 | Temp 98.4°F

## 2015-09-17 DIAGNOSIS — D469 Myelodysplastic syndrome, unspecified: Secondary | ICD-10-CM | POA: Diagnosis present

## 2015-09-17 DIAGNOSIS — D539 Nutritional anemia, unspecified: Secondary | ICD-10-CM | POA: Diagnosis not present

## 2015-09-17 MED ORDER — TBO-FILGRASTIM 300 MCG/0.5ML ~~LOC~~ SOSY
300.0000 ug | PREFILLED_SYRINGE | Freq: Once | SUBCUTANEOUS | Status: AC
  Administered 2015-09-17: 300 ug via SUBCUTANEOUS
  Filled 2015-09-17: qty 0.5

## 2015-09-27 ENCOUNTER — Other Ambulatory Visit: Payer: Self-pay | Admitting: *Deleted

## 2015-09-27 ENCOUNTER — Telehealth: Payer: Self-pay | Admitting: *Deleted

## 2015-09-27 DIAGNOSIS — D539 Nutritional anemia, unspecified: Secondary | ICD-10-CM

## 2015-09-27 DIAGNOSIS — D709 Neutropenia, unspecified: Secondary | ICD-10-CM

## 2015-09-27 DIAGNOSIS — D469 Myelodysplastic syndrome, unspecified: Secondary | ICD-10-CM

## 2015-09-27 DIAGNOSIS — D72819 Decreased white blood cell count, unspecified: Secondary | ICD-10-CM

## 2015-09-27 NOTE — Telephone Encounter (Signed)
Patient called and Dr. Mar Daring put him on nitrofurantoin macro 100mg  one BID.  He wants to confirm with Dr. Julien Nordmann that this is fine for him to take before starting.  VO given and read back from Dr. Julien Nordmann, yes, this is fine. Called patient and let him know that this is fine to start per Dr. Julien Nordmann.

## 2015-10-01 ENCOUNTER — Ambulatory Visit (HOSPITAL_BASED_OUTPATIENT_CLINIC_OR_DEPARTMENT_OTHER): Payer: Medicare Other | Admitting: Physician Assistant

## 2015-10-01 ENCOUNTER — Telehealth: Payer: Self-pay | Admitting: Internal Medicine

## 2015-10-01 ENCOUNTER — Ambulatory Visit (HOSPITAL_BASED_OUTPATIENT_CLINIC_OR_DEPARTMENT_OTHER): Payer: Medicare Other

## 2015-10-01 ENCOUNTER — Other Ambulatory Visit (HOSPITAL_BASED_OUTPATIENT_CLINIC_OR_DEPARTMENT_OTHER): Payer: Medicare Other

## 2015-10-01 VITALS — BP 116/52 | HR 73 | Temp 98.5°F | Resp 18 | Ht 72.0 in | Wt 179.7 lb

## 2015-10-01 DIAGNOSIS — D708 Other neutropenia: Secondary | ICD-10-CM | POA: Diagnosis not present

## 2015-10-01 DIAGNOSIS — D469 Myelodysplastic syndrome, unspecified: Secondary | ICD-10-CM

## 2015-10-01 DIAGNOSIS — D709 Neutropenia, unspecified: Secondary | ICD-10-CM

## 2015-10-01 DIAGNOSIS — D4620 Refractory anemia with excess of blasts, unspecified: Secondary | ICD-10-CM

## 2015-10-01 DIAGNOSIS — D539 Nutritional anemia, unspecified: Secondary | ICD-10-CM

## 2015-10-01 DIAGNOSIS — D72819 Decreased white blood cell count, unspecified: Secondary | ICD-10-CM

## 2015-10-01 LAB — CBC WITH DIFFERENTIAL/PLATELET
BASO%: 0.9 % (ref 0.0–2.0)
BASOS ABS: 0 10*3/uL (ref 0.0–0.1)
EOS ABS: 0 10*3/uL (ref 0.0–0.5)
EOS%: 0.4 % (ref 0.0–7.0)
HEMATOCRIT: 30.2 % — AB (ref 38.4–49.9)
HGB: 10.1 g/dL — ABNORMAL LOW (ref 13.0–17.1)
LYMPH%: 62.1 % — ABNORMAL HIGH (ref 14.0–49.0)
MCH: 35.3 pg — ABNORMAL HIGH (ref 27.2–33.4)
MCHC: 33.4 g/dL (ref 32.0–36.0)
MCV: 105.6 fL — AB (ref 79.3–98.0)
MONO#: 0.4 10*3/uL (ref 0.1–0.9)
MONO%: 16.1 % — AB (ref 0.0–14.0)
NEUT#: 0.5 10*3/uL — CL (ref 1.5–6.5)
NEUT%: 20.5 % — ABNORMAL LOW (ref 39.0–75.0)
NRBC: 6 % — AB (ref 0–0)
PLATELETS: 83 10*3/uL — AB (ref 140–400)
RBC: 2.86 10*6/uL — AB (ref 4.20–5.82)
RDW: 21 % — AB (ref 11.0–14.6)
WBC: 2.2 10*3/uL — ABNORMAL LOW (ref 4.0–10.3)
lymph#: 1.4 10*3/uL (ref 0.9–3.3)

## 2015-10-01 MED ORDER — TBO-FILGRASTIM 300 MCG/0.5ML ~~LOC~~ SOSY
300.0000 ug | PREFILLED_SYRINGE | Freq: Once | SUBCUTANEOUS | Status: AC
  Administered 2015-10-01: 300 ug via SUBCUTANEOUS
  Filled 2015-10-01: qty 0.5

## 2015-10-01 NOTE — Patient Instructions (Signed)

## 2015-10-01 NOTE — Telephone Encounter (Signed)
Gave patient avs report and appointments for January and February. Message to MM re patient seeing LT 1/16 due to MM has no availability. Added additional cycles due to provider availability.

## 2015-10-01 NOTE — Progress Notes (Signed)
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 First dose on 12/19   INTERVAL HISTORY: Dean Stanley 82 y.o. male returns to the clinic today for follow-up visit. The patient is feeling fine today with no specific complaints except for fatigue. He has no fever or chills.  He has no bleeding issues, Except for mild bleeding on the right shin area for about 2 days, which quickly resolved. No petechial rash.. He denied having any significant chest pain, shortness breath, cough or hemoptysis. The patient denied having any weight loss or night sweats. He has no nausea or vomiting.He is here to discuss the treatment plan and options based on the IPI score. MEDICAL HISTORY: Past Medical History  Diagnosis Date  . Meniere's syndrome 1980s  . History of ITP 1960  . Anemia   . Complication of anesthesia     Reaction to anesthesia " things went haywire around me"  . Hypothyroidism   . Hearing impairment     wears hearing left ear    ALLERGIES:  is allergic to hydrocodone and oxycodone.  MEDICATIONS:  Current Outpatient Prescriptions  Medication Sig Dispense Refill  . desmopressin (DDAVP) 0.2 MG tablet Take 0.2 mg by mouth daily.    . ferrous sulfate 325 (65 FE) MG tablet Take 325 mg by mouth daily with breakfast.    . finasteride (PROPECIA) 1 MG tablet Take 1 mg by mouth daily.    Marland Kitchen levothyroxine (SYNTHROID, LEVOTHROID) 200 MCG tablet Take 200 mcg by mouth daily.    . meloxicam (MOBIC) 7.5 MG tablet Take 7.5 mg by mouth daily.    . Multiple  Vitamins-Minerals (CENTRUM ADULTS PO) Take 1 each by mouth.     No current facility-administered medications for this visit.    SURGICAL HISTORY:  Past Surgical History  Procedure Laterality Date  . Colonoscopy    . Cortisone injections to back x 6    . Circumcision    . Cataract Right 2011  . Orif orbital fracture Left 2000    2 fractures orbital platform    REVIEW OF SYSTEMS:  Constitutional: positive for fatigue Eyes: negative Ears, nose, mouth, throat, and face: negative Respiratory: negative Cardiovascular: negative Gastrointestinal: negative Genitourinary:negative Integument/breast: negative Hematologic/lymphatic: negative Musculoskeletal:negative Neurological: negative Behavioral/Psych: negative Endocrine: negative Allergic/Immunologic: negative   PHYSICAL EXAMINATION: General appearance: alert, cooperative, fatigued and no distress Head: Normocephalic, without obvious abnormality, atraumatic Neck: no adenopathy, no JVD, supple, symmetrical, trachea midline and thyroid not enlarged, symmetric, no tenderness/mass/nodules Lymph nodes: Cervical, supraclavicular, and axillary nodes normal. Resp: clear to auscultation bilaterally Back: symmetric, no curvature. ROM normal. No CVA tenderness. Cardio: regular rate and rhythm, S1, S2 normal, no murmur, click, rub or gallop GI: soft, non-tender; bowel sounds normal; no masses,  no organomegaly Extremities: extremities normal, atraumatic, no cyanosis or edema Neurologic: Alert and oriented X 3, normal strength and tone. Normal symmetric reflexes. Normal coordination and gait  ECOG PERFORMANCE STATUS: 1 - Symptomatic but completely ambulatory  There were no vitals taken for this visit.  LABORATORY DATA: CBC Latest Ref Rng 10/01/2015 09/10/2015 09/04/2015  WBC  4.0 - 10.3 10e3/uL 2.2(L) 2.3(L) 2.1(L)  Hemoglobin 13.0 - 17.1 g/dL 10.1(L) 9.7(L) 10.2(L)  Hematocrit 38.4 - 49.9 % 30.2(L) 29.0(L) 30.5(L)  Platelets 140 - 400  10e3/uL 83(L) 96(L) 141(L)   ANC is 0.5  CMP Latest Ref Rng 12/19/2014 08/25/2006  Glucose 70 - 140 mg/dl 90 89  BUN 7.0 - 26.0 mg/dL 12.1 14  Creatinine 0.7 - 1.3 mg/dL 0.8 0.90  Sodium 136 - 145 mEq/L 140 141  Potassium 3.5 - 5.1 mEq/L 4.5 4.4  Chloride 96 - 112 mEq/L - 104  CO2 22 - 29 mEq/L 25 29  Calcium 8.4 - 10.4 mg/dL 9.0 9.6  Total Protein 6.4 - 8.3 g/dL 7.4 7.2  Total Bilirubin 0.20 - 1.20 mg/dL 1.43(H) 1.2  Alkaline Phos 40 - 150 U/L 44 45  AST 5 - 34 U/L 18 18  ALT 0 - 55 U/L 14 17     RADIOGRAPHIC STUDIES: Ct Biopsy  09/04/2015  INDICATION: History of anemia, leukopenia and ITP. Please perform CT-guided bone marrow biopsy and aspiration for tissue diagnostic purposes. EXAM: CT-GUIDED BONE MARROW BIOPSY AND ASPIRATION MEDICATIONS: Fentanyl 75 mcg IV; Versed 1.5 mg IV ANESTHESIA/SEDATION: Sedation Time 4 minutes CONTRAST:  None COMPLICATIONS: None immediate. PROCEDURE: Informed consent was obtained from the patient following an explanation of the procedure, risks, benefits and alternatives. The patient understands, agrees and consents for the procedure. All questions were addressed. A time out was performed prior to the initiation of the procedure. The patient was positioned prone and non-contrast localization CT was performed of the pelvis to demonstrate the iliac marrow spaces. The operative site was prepped and draped in the usual sterile fashion. Under sterile conditions and local anesthesia, a 22 gauge spinal needle was utilized for procedural planning. Next, an 11 gauge coaxial bone biopsy needle was advanced into the left iliac marrow space. Needle position was confirmed with CT imaging. Initially, bone marrow aspiration was performed. Next, a bone marrow biopsy was obtained with the 11 gauge outer bone marrow device. The 11 gauge coaxial bone biopsy needle was re-advanced into a slightly different location within the left iliac marrow space, positioning was confirmed and an  additional bone marrow biopsy was obtained. Samples were prepared with the cytotechnologist and deemed adequate. The needle was removed intact. Hemostasis was obtained with compression and a dressing was placed. The patient tolerated the procedure well without immediate post procedural complication. IMPRESSION: Successful CT guided left iliac bone marrow aspiration and core biopsies. Electronically Signed   By: Sandi Mariscal M.D.   On: 09/04/2015 12:14   Patient: ULYESS, MUTO DAVID Collected: 09/04/2015 Client: [facility] Accession: [id number] Received: 09/04/2015 John "Ronny Bacon, MD DOB: 1931/07/26 Age: 89 Gender: M Reported: 09/06/2015 [address] Patient Ph: [phone] MRN #: [id number] [place], [place] [address] Visit #: [id number].[place] Chart #: Phone: [phone] Fax: CC: Curt Bears, MD BONE MARROW REPORT FINAL DIAGNOSIS Diagnosis Bone Marrow, Aspirate,Biopsy, and Clot - HYPERCELLULAR BONE MARROW WITH A MYELODYSPLASTIC STATE PERIPHERAL BLOOD: - MACROCYTIC ANEMIA. - LEUKOPENIA Diagnosis Note The bone marrow is hypercellular with dyspoietic changes involving all myeloid cell lines associated with numerous ring sideroblasts. The blastic component shows borderline increase (6%) by morphology associated with increased number of CD34-positive mononuclear cells as seen by immunohistochemistry . Flow cytometric analysis, however, failed to show a significant blastic population possibly due to sampling. Given the overall findings, the changes are most consistent with refractory anemia with excess blasts (RAEB-1). Correlation with cytogenetic studies is recommended. (BNS:gt:ecj, 09/05/15) Joella Prince  SMIR MD Pathologist, Electronic Signature (Case signed 09/06/2015)  ASSESSMENT AND PLAN:  This is a very pleasant 82 years old white male with recently diagnosed myelodysplastic syndrome consistent with refractory anemia with excess blast. The cytogenetics showed no significant blastic  population. Treatment options were discussed with the patient after evaluating the international prognostic index, hich is intermediate.His chances of survival are of average of 1.2 years based on these IPI score. Dr. Earlie Server has seen and evaluated the patient, recommending Vidaza therapy Days 1-5, every 4 weeks. Details will be evaluated prior to the proper plan. He will be taking these chemotherapy for about 4-6 months, To reduce the risk of conversion, and increase the survival. The patient will start next week. Chemotherapy class will be given tomorrow. In the interim, for the refractory anemia, he will continue on  Aranesp 300 g subcutaneously every 3 weeks, last dose on 12/19, next today For the neutropenia,he was started on Granix 300 g subcutaneously for 2 days (12/19 and 12/20); will proceed with his next 2 doses today and tomorrow We will continue to monitor his CBC regularly with the Aranesp injection and I would consider the patient for more treatment with Granix If he has any persistent neutropenia.We will also monitor his platelet counts.  The patient will return in one week, prior to the initiation of chemotherapy. He was advised to call immediately if he has any significant fever or chills or any other signs of infection during this period.  He was advised to call immediately if he has any concerning symptoms in the interval. The patient voices understanding of current disease status and treatment options and is in agreement with the current care plan.  All questions were answered. The patient knows to call the clinic with any problems, questions or concerns. We can certainly see the patient much sooner if necessary.   ADDENDUM: Hematology/Oncology Attending: I had a face to face encounter with the patient today. I recommended his care plan. This is a very pleasant 82 years old white male recently diagnosed with intermediate risk myelodysplastic syndrome consistent with refractory  anemia with excess blasts. The patient also has a history of idiopathic thrombocytopenic purpura many years ago. He is currently on supportive management with Aranesp 300 g subcutaneously every 3 weeks in addition to Granix 300 g subcutaneously as needed for neutropenia. He came today for evaluation accompanied by his wife. Cytogenetics showed no chromosomal abnormalities. I had a lengthy discussion with the patient and his wife will have several questions today about his current condition, prognosis and treatment options. I discussed with the patient the option of continuous supportive care with Aranesp and Granix as well as blood transfusion if needed. I also discussed with the patient the option of treatment with Vidaza 75 MG/M2 IV daily for 5 days every 4 weeks. I discussed with the patient adverse effect of this treatment including worsening of myelosuppression with more frequent need for support and transfusion if needed. There was also a risk of neutropenia as well as neutropenic fever. He was also advised about the other adverse effect including but not limited to alopecia, nausea and vomiting, liver or renal dysfunction. He was given a handout about Vidaza. He is very interested in proceeding with treatment. I will arrange for the patient to have a chemotherapy education class before starting the first dose of his treatment. He is expected to start the first cycle of this treatment on 10/06/2014. I will see the patient back for follow-up visit in 2  weeks for reevaluation and management of any adverse effect of his treatment. If the patient and his wife the time to ask questions and I answered them completely to their satisfaction. He was advised to call immediately if he has any concerning symptoms in the interval.  Disclaimer: This note was dictated with voice recognition software. Similar sounding words can inadvertently be transcribed and may be missed upon review.  Eilleen Kempf.,  MD 10/01/2015

## 2015-10-02 ENCOUNTER — Other Ambulatory Visit: Payer: Medicare Other

## 2015-10-02 ENCOUNTER — Encounter: Payer: Self-pay | Admitting: *Deleted

## 2015-10-02 ENCOUNTER — Ambulatory Visit (HOSPITAL_BASED_OUTPATIENT_CLINIC_OR_DEPARTMENT_OTHER): Payer: Medicare Other

## 2015-10-02 VITALS — BP 122/56 | HR 90 | Temp 98.6°F

## 2015-10-02 DIAGNOSIS — D469 Myelodysplastic syndrome, unspecified: Secondary | ICD-10-CM | POA: Diagnosis present

## 2015-10-02 DIAGNOSIS — D539 Nutritional anemia, unspecified: Secondary | ICD-10-CM | POA: Diagnosis not present

## 2015-10-02 MED ORDER — TBO-FILGRASTIM 300 MCG/0.5ML ~~LOC~~ SOSY
300.0000 ug | PREFILLED_SYRINGE | Freq: Once | SUBCUTANEOUS | Status: AC
  Administered 2015-10-02: 300 ug via SUBCUTANEOUS
  Filled 2015-10-02: qty 0.5

## 2015-10-07 ENCOUNTER — Other Ambulatory Visit (HOSPITAL_BASED_OUTPATIENT_CLINIC_OR_DEPARTMENT_OTHER): Payer: Medicare Other

## 2015-10-07 ENCOUNTER — Other Ambulatory Visit: Payer: Self-pay | Admitting: *Deleted

## 2015-10-07 ENCOUNTER — Ambulatory Visit (HOSPITAL_BASED_OUTPATIENT_CLINIC_OR_DEPARTMENT_OTHER): Payer: Medicare Other

## 2015-10-07 VITALS — BP 115/62 | HR 66 | Temp 98.7°F | Resp 18

## 2015-10-07 DIAGNOSIS — Z5111 Encounter for antineoplastic chemotherapy: Secondary | ICD-10-CM

## 2015-10-07 DIAGNOSIS — D4620 Refractory anemia with excess of blasts, unspecified: Secondary | ICD-10-CM

## 2015-10-07 DIAGNOSIS — D469 Myelodysplastic syndrome, unspecified: Secondary | ICD-10-CM | POA: Diagnosis not present

## 2015-10-07 DIAGNOSIS — D539 Nutritional anemia, unspecified: Secondary | ICD-10-CM

## 2015-10-07 LAB — CBC WITH DIFFERENTIAL/PLATELET
BASO%: 1 % (ref 0.0–2.0)
BASOS ABS: 0 10*3/uL (ref 0.0–0.1)
EOS%: 0.3 % (ref 0.0–7.0)
Eosinophils Absolute: 0 10*3/uL (ref 0.0–0.5)
HCT: 31.5 % — ABNORMAL LOW (ref 38.4–49.9)
HEMOGLOBIN: 10.5 g/dL — AB (ref 13.0–17.1)
LYMPH#: 1.7 10*3/uL (ref 0.9–3.3)
LYMPH%: 56.5 % — ABNORMAL HIGH (ref 14.0–49.0)
MCH: 35 pg — AB (ref 27.2–33.4)
MCHC: 33.3 g/dL (ref 32.0–36.0)
MCV: 105 fL — AB (ref 79.3–98.0)
MONO#: 0.6 10*3/uL (ref 0.1–0.9)
MONO%: 19.4 % — AB (ref 0.0–14.0)
NEUT#: 0.7 10*3/uL — ABNORMAL LOW (ref 1.5–6.5)
NEUT%: 22.8 % — ABNORMAL LOW (ref 39.0–75.0)
NRBC: 4 % — AB (ref 0–0)
Platelets: 132 10*3/uL — ABNORMAL LOW (ref 140–400)
RBC: 3 10*6/uL — AB (ref 4.20–5.82)
RDW: 21.9 % — AB (ref 11.0–14.6)
WBC: 3 10*3/uL — AB (ref 4.0–10.3)

## 2015-10-07 LAB — COMPREHENSIVE METABOLIC PANEL
ALT: 15 U/L (ref 0–55)
AST: 23 U/L (ref 5–34)
Albumin: 4.4 g/dL (ref 3.5–5.0)
Alkaline Phosphatase: 48 U/L (ref 40–150)
Anion Gap: 6 mEq/L (ref 3–11)
BUN: 12.9 mg/dL (ref 7.0–26.0)
CO2: 24 meq/L (ref 22–29)
Calcium: 9 mg/dL (ref 8.4–10.4)
Chloride: 105 mEq/L (ref 98–109)
Creatinine: 0.9 mg/dL (ref 0.7–1.3)
EGFR: 80 mL/min/{1.73_m2} — ABNORMAL LOW (ref >90)
GLUCOSE: 83 mg/dL (ref 70–140)
POTASSIUM: 4.5 meq/L (ref 3.5–5.1)
SODIUM: 135 meq/L — AB (ref 136–145)
TOTAL PROTEIN: 7.2 g/dL (ref 6.4–8.3)
Total Bilirubin: 0.95 mg/dL (ref 0.20–1.20)

## 2015-10-07 MED ORDER — DARBEPOETIN ALFA 300 MCG/0.6ML IJ SOSY
300.0000 ug | PREFILLED_SYRINGE | Freq: Once | INTRAMUSCULAR | Status: DC
  Filled 2015-10-07: qty 0.6

## 2015-10-07 MED ORDER — SODIUM CHLORIDE 0.9 % IV SOLN
Freq: Once | INTRAVENOUS | Status: AC
  Administered 2015-10-07: 12:00:00 via INTRAVENOUS

## 2015-10-07 MED ORDER — ONDANSETRON HCL 40 MG/20ML IJ SOLN
Freq: Once | INTRAMUSCULAR | Status: AC
  Administered 2015-10-07: 12:00:00 via INTRAVENOUS
  Filled 2015-10-07: qty 4

## 2015-10-07 MED ORDER — PROCHLORPERAZINE MALEATE 10 MG PO TABS: 10.0000 mg | ORAL_TABLET | Freq: Three times a day (TID) | ORAL | Status: DC | PRN

## 2015-10-07 MED ORDER — LACTATED RINGERS IV SOLN
75.0000 mg/m2 | Freq: Every day | INTRAVENOUS | Status: DC
  Administered 2015-10-07: 150 mg via INTRAVENOUS
  Filled 2015-10-07: qty 30

## 2015-10-07 NOTE — Progress Notes (Signed)
No Aranesp per Dr. Julien Nordmann.

## 2015-10-07 NOTE — Progress Notes (Signed)
Patient okay to treat today, despite Neut= 0.7, per Dr. Julien Nordmann.

## 2015-10-07 NOTE — Patient Instructions (Signed)
[facility] Discharge Instructions for Patients Receiving Chemotherapy  Today you received the following chemotherapy agents Vidaza  To help prevent nausea and vomiting after your treatment, we encourage you to take your nausea medication     If you develop nausea and vomiting that is not controlled by your nausea medication, call the clinic.   BELOW ARE SYMPTOMS THAT SHOULD BE REPORTED IMMEDIATELY:  *FEVER GREATER THAN 100.5 F  *CHILLS WITH OR WITHOUT FEVER  NAUSEA AND VOMITING THAT IS NOT CONTROLLED WITH YOUR NAUSEA MEDICATION  *UNUSUAL SHORTNESS OF BREATH  *UNUSUAL BRUISING OR BLEEDING  TENDERNESS IN MOUTH AND THROAT WITH OR WITHOUT PRESENCE OF ULCERS  *URINARY PROBLEMS  *BOWEL PROBLEMS  UNUSUAL RASH Items with * indicate a potential emergency and should be followed up as soon as possible.  Feel free to call the clinic you have any questions or concerns. The clinic phone number is [phone].  Please show the [facility] at check-in to the Emergency Department and triage nurse.  Azacitidine suspension for injection (subcutaneous use) What is this medicine? AZACITIDINE ([place]) is a chemotherapy drug. This medicine reduces the growth of cancer cells and can suppress the immune system. It is used for treating myelodysplastic syndrome or some types of leukemia. This medicine may be used for other purposes; ask your health care provider or pharmacist if you have questions. What should I tell my health care provider before I take this medicine? They need to know if you have any of these conditions: -infection (especially a virus infection such as chickenpox, cold sores, or herpes) -kidney disease -liver disease -liver tumors -an unusual or allergic reaction to azacitidine, mannitol, other medicines, foods, dyes, or preservatives -pregnant or trying to get pregnant -breast-feeding How should I use this medicine? This medicine is for  injection under the skin. It is administered in a hospital or clinic by a specially trained health care professional. Talk to your pediatrician regarding the use of this medicine in children. While this drug may be prescribed for selected conditions, precautions do apply. Overdosage: If you think you have taken too much of this medicine contact a poison control center or emergency room at once. NOTE: This medicine is only for you. Do not share this medicine with others. What if I miss a dose? It is important not to miss your dose. Call your doctor or health care professional if you are unable to keep an appointment. What may interact with this medicine? Interactions have not been studied. Give your health care provider a list of all the medicines, herbs, non-prescription drugs, or dietary supplements you use. Also tell them if you smoke, drink alcohol, or use illegal drugs. Some items may interact with your medicine. This list may not describe all possible interactions. Give your health care provider a list of all the medicines, herbs, non-prescription drugs, or dietary supplements you use. Also tell them if you smoke, drink alcohol, or use illegal drugs. Some items may interact with your medicine. What should I watch for while using this medicine? Visit your doctor for checks on your progress. This drug may make you feel generally unwell. This is not uncommon, as chemotherapy can affect healthy cells as well as cancer cells. Report any side effects. Continue your course of treatment even though you feel ill unless your doctor tells you to stop. In some cases, you may be given additional medicines to help with side effects. Follow all directions for their use. Call  your doctor or health care professional for advice if you get a fever, chills or sore throat, or other symptoms of a cold or flu. Do not treat yourself. This drug decreases your body's ability to fight infections. Try to avoid being around  people who are sick. This medicine may increase your risk to bruise or bleed. Call your doctor or health care professional if you notice any unusual bleeding. Do not have any vaccinations without your doctor's approval and avoid anyone who has recently had oral polio vaccine. Do not become pregnant while taking this medicine. Women should inform their doctor if they wish to become pregnant or think they might be pregnant. There is a potential for serious side effects to an unborn child. Talk to your health care professional or pharmacist for more information. Do not breast-feed an infant while taking this medicine. If you are a man, you should not father a child while receiving treatment. What side effects may I notice from receiving this medicine? Side effects that you should report to your doctor or health care professional as soon as possible: -allergic reactions like skin rash, itching or hives, swelling of the face, lips, or tongue -low blood counts - this medicine may decrease the number of white blood cells, red blood cells and platelets. You may be at increased risk for infections and bleeding. -signs of infection - fever or chills, cough, sore throat, pain or difficulty passing urine -signs of decreased platelets or bleeding - bruising, pinpoint red spots on the skin, black, tarry stools, blood in the urine -signs of decreased red blood cells - unusually weak or tired, fainting spells, lightheadedness -reactions at the injection site including redness, pain, itching, or bruising -breathing problems -changes in vision -fever -mouth sores -stomach pain -vomiting Side effects that usually do not require medical attention (report to your doctor or health care professional if they continue or are bothersome): -constipation -diarrhea -loss of appetite -nausea -pain or redness at the injection site -weak or tired This list may not describe all possible side effects. Call your doctor for  medical advice about side effects. You may report side effects to FDA at 1-800-FDA-1088. Where should I keep my medicine? This drug is given in a hospital or clinic and will not be stored at home. NOTE: This sheet is a summary. It may not cover all possible information. If you have questions about this medicine, talk to your doctor, pharmacist, or health care provider.    2016, Elsevier/Gold Standard. (2014-06-08 18:13:53)

## 2015-10-08 ENCOUNTER — Other Ambulatory Visit: Payer: Self-pay | Admitting: *Deleted

## 2015-10-08 ENCOUNTER — Ambulatory Visit (HOSPITAL_BASED_OUTPATIENT_CLINIC_OR_DEPARTMENT_OTHER): Payer: Medicare Other | Admitting: Nurse Practitioner

## 2015-10-08 ENCOUNTER — Ambulatory Visit: Payer: Medicare Other

## 2015-10-08 ENCOUNTER — Ambulatory Visit (HOSPITAL_BASED_OUTPATIENT_CLINIC_OR_DEPARTMENT_OTHER): Payer: Medicare Other

## 2015-10-08 DIAGNOSIS — R21 Rash and other nonspecific skin eruption: Secondary | ICD-10-CM | POA: Diagnosis not present

## 2015-10-08 DIAGNOSIS — Z5111 Encounter for antineoplastic chemotherapy: Secondary | ICD-10-CM

## 2015-10-08 DIAGNOSIS — D469 Myelodysplastic syndrome, unspecified: Secondary | ICD-10-CM | POA: Diagnosis present

## 2015-10-08 MED ORDER — LACTATED RINGERS IV SOLN
75.0000 mg/m2 | Freq: Every day | INTRAVENOUS | Status: DC
  Administered 2015-10-08: 150 mg via INTRAVENOUS
  Filled 2015-10-08: qty 30

## 2015-10-08 MED ORDER — CEPHALEXIN 500 MG PO CAPS: 500.0000 mg | ORAL_CAPSULE | Freq: Four times a day (QID) | ORAL | Status: DC

## 2015-10-08 MED ORDER — VALACYCLOVIR HCL 1 G PO TABS: 1000.0000 mg | ORAL_TABLET | Freq: Three times a day (TID) | ORAL | Status: DC

## 2015-10-08 MED ORDER — SODIUM CHLORIDE 0.9 % IV SOLN
Freq: Once | INTRAVENOUS | Status: AC
  Administered 2015-10-08: 13:00:00 via INTRAVENOUS
  Filled 2015-10-08: qty 4

## 2015-10-08 MED ORDER — SODIUM CHLORIDE 0.9 % IV SOLN
Freq: Once | INTRAVENOUS | Status: AC
  Administered 2015-10-08: 13:00:00 via INTRAVENOUS

## 2015-10-08 NOTE — Patient Instructions (Signed)
[facility] Discharge Instructions for Patients Receiving Chemotherapy  Today you received the following chemotherapy agents Vidaza  To help prevent nausea and vomiting after your treatment, we encourage you to take your nausea medication Compazine 10 mg every 6 hours as needed  If you develop nausea and vomiting that is not controlled by your nausea medication, call the clinic.   BELOW ARE SYMPTOMS THAT SHOULD BE REPORTED IMMEDIATELY:  *FEVER GREATER THAN 100.5 F  *CHILLS WITH OR WITHOUT FEVER  NAUSEA AND VOMITING THAT IS NOT CONTROLLED WITH YOUR NAUSEA MEDICATION  *UNUSUAL SHORTNESS OF BREATH  *UNUSUAL BRUISING OR BLEEDING  TENDERNESS IN MOUTH AND THROAT WITH OR WITHOUT PRESENCE OF ULCERS  *URINARY PROBLEMS  *BOWEL PROBLEMS  UNUSUAL RASH Items with * indicate a potential emergency and should be followed up as soon as possible.  Feel free to call the clinic you have any questions or concerns. The clinic phone number is [phone].  Please show the CHEMO ALERT CARD at check-in to the Emergency Department and triage nurse.   

## 2015-10-08 NOTE — Progress Notes (Signed)
Late Entry; 1250:  Pt states after 1st vidaza treatment yesterday afternoon "two red spots popped up on my right shoulder"  Pt states this is different from his regular rash that is over his entire body; does hurt when you touch it or clothes "brush up against it."  One spot does look raised, open small amount of drainage noted on band-aid, very red around area.  Selena Lesser, NP notified to assess rash to r/o shingles.

## 2015-10-09 ENCOUNTER — Ambulatory Visit (HOSPITAL_BASED_OUTPATIENT_CLINIC_OR_DEPARTMENT_OTHER): Payer: Medicare Other | Admitting: Nurse Practitioner

## 2015-10-09 ENCOUNTER — Encounter: Payer: Self-pay | Admitting: Nurse Practitioner

## 2015-10-09 ENCOUNTER — Encounter: Payer: Self-pay | Admitting: *Deleted

## 2015-10-09 ENCOUNTER — Ambulatory Visit (HOSPITAL_BASED_OUTPATIENT_CLINIC_OR_DEPARTMENT_OTHER): Payer: Medicare Other

## 2015-10-09 ENCOUNTER — Telehealth: Payer: Self-pay | Admitting: *Deleted

## 2015-10-09 VITALS — BP 116/58 | HR 87 | Temp 97.5°F | Resp 20

## 2015-10-09 DIAGNOSIS — Z5111 Encounter for antineoplastic chemotherapy: Secondary | ICD-10-CM

## 2015-10-09 DIAGNOSIS — R21 Rash and other nonspecific skin eruption: Secondary | ICD-10-CM | POA: Insufficient documentation

## 2015-10-09 DIAGNOSIS — D469 Myelodysplastic syndrome, unspecified: Secondary | ICD-10-CM | POA: Diagnosis present

## 2015-10-09 MED ORDER — ONDANSETRON HCL 40 MG/20ML IJ SOLN
Freq: Once | INTRAMUSCULAR | Status: AC
  Administered 2015-10-09: 08:00:00 via INTRAVENOUS
  Filled 2015-10-09: qty 4

## 2015-10-09 MED ORDER — SODIUM CHLORIDE 0.9 % IV SOLN
Freq: Once | INTRAVENOUS | Status: AC
  Administered 2015-10-09: 08:00:00 via INTRAVENOUS

## 2015-10-09 MED ORDER — LACTATED RINGERS IV SOLN
75.0000 mg/m2 | Freq: Every day | INTRAVENOUS | Status: DC
  Administered 2015-10-09: 150 mg via INTRAVENOUS
  Filled 2015-10-09: qty 30

## 2015-10-09 NOTE — Addendum Note (Signed)
Addended by: Adalberto Cole on: 10/09/2015 11:03 AM   Modules accepted: Orders, Medications

## 2015-10-09 NOTE — Addendum Note (Signed)
Addended by: Lucile Crater on: 10/09/2015 02:02 PM   Modules accepted: Orders

## 2015-10-09 NOTE — Assessment & Plan Note (Addendum)
In for brief check of rash with patient this morning. Patient's right top shoulder continues with mild to moderate erythema, trace warmth, and edema. Patient has a Band-Aid over the primary lesion to the top of the right shoulder.  There are no red streaks noted.  Also, circular rash 2.  The right upper abdomen does appear to have increased since yesterday's visit. Also, patient has some of this same trace rash to the right lower abdominal area as well. Does not appear to be blistering at this point.   Differential diagnosis includes cellulitis to the right top shoulder lesions , shingles , and irritated dermatitis.   Patient has plans to visit with his dermatologist later this afternoon for further evaluation and management. We will send a copy of the notes from this week to the dermatologist for review.    In the meantime- patient confirmed that he did pick up of the Keflex antibiotics and the valacyclovir in case this was shingles.

## 2015-10-09 NOTE — Telephone Encounter (Signed)
-----   Message from Arna Snipe, RN sent at 10/07/2015  2:37 PM EST ----- Regarding: [place] Follow-up call Contact: [phone] First time Vidaza. Patient tolerated well. Pt will be in for tx the rest of the week.

## 2015-10-09 NOTE — Progress Notes (Signed)
SYMPTOM MANAGEMENT CLINIC   HPI: Dean Stanley 82 y.o. male diagnosed with  Myelodysplastic syndrome.  Currently undergoing Vidaza  And Aranesp therapy.  In for brief check of rash with patient this morning. Patient's right top shoulder continues with mild to moderate erythema, trace warmth, and edema. Patient has a Band-Aid over the primary lesion to the top of the right shoulder.  There are no red streaks noted.  Also, circular rash 2.  The right upper abdomen does appear to have increased since yesterday's visit. Also, patient has some of this same trace rash to the right lower abdominal area as well. Does not appear to be blistering at this point.   Differential diagnosis includes cellulitis to the right top shoulder lesions , shingles , and irritated dermatitis.   Patient has plans to visit with his dermatologist later this afternoon for further evaluation and management. We will send a copy of the notes from this week to the dermatologist for review.    In the meantime- patient confirmed that he did pick up of the Keflex antibiotics and the valacyclovir in case this was shingles.   HPI  ROS  Past Medical History  Diagnosis Date  . Meniere's syndrome 1980s  . History of ITP 1960  . Anemia   . Complication of anesthesia     Reaction to anesthesia " things went haywire around me"  . Hypothyroidism   . Hearing impairment     wears hearing left ear    Past Surgical History  Procedure Laterality Date  . Colonoscopy    . Cortisone injections to back x 6    . Circumcision    . Cataract Right 2011  . Orif orbital fracture Left 2000    2 fractures orbital platform    has Deficiency anemia; Meniere's syndrome; Neutropenia ([place]); Leukocytopenia; MDS (myelodysplastic syndrome) ([place]); and Rash on his problem list.    is allergic to hydrocodone and oxycodone.    Medication List       This list is accurate as of: 10/09/15  9:32 AM.  Always use your most recent med  list.               multivitamin-lutein Caps capsule  Take 40 capsules by mouth daily.     CENTRUM ADULTS PO  Take 1 each by mouth.     cephALEXin 500 MG capsule  Commonly known as:  KEFLEX  Take 1 capsule (500 mg total) by mouth 4 (four) times daily.     desmopressin 0.2 MG tablet  Commonly known as:  DDAVP  Take 0.2 mg by mouth daily.     ferrous sulfate 325 (65 FE) MG tablet  Take 325 mg by mouth daily with breakfast.     finasteride 1 MG tablet  Commonly known as:  PROPECIA  Take 1 mg by mouth daily.     levothyroxine 200 MCG tablet  Commonly known as:  SYNTHROID, LEVOTHROID  Take 200 mcg by mouth daily.     meloxicam 7.5 MG tablet  Commonly known as:  MOBIC  Take 7.5 mg by mouth daily.     prochlorperazine 10 MG tablet  Commonly known as:  COMPAZINE  Take 1 tablet (10 mg total) by mouth every 8 (eight) hours as needed for nausea or vomiting.     valACYclovir 1000 MG tablet  Commonly known as:  VALTREX  Take 1 tablet (1,000 mg total) by mouth 3 (three) times daily.  PHYSICAL EXAMINATION  Oncology Vitals 10/09/2015 10/07/2015  Height - -  Weight - -  Weight (lbs) - -  BMI (kg/m2) - -  Temp 97.5 98.7  Pulse 87 66  Resp 20 18  SpO2 98 100  BSA (m2) - -   BP Readings from Last 2 Encounters:  10/09/15 116/58  10/07/15 115/62    Physical Exam  Constitutional: He is oriented to person, place, and time and well-developed, well-nourished, and in no distress.  HENT:  Head: Normocephalic and atraumatic.  Eyes: Conjunctivae and EOM are normal. Pupils are equal, round, and reactive to light. Right eye exhibits no discharge. Left eye exhibits no discharge. No scleral icterus.  Neck: Normal range of motion.  Pulmonary/Chest: Effort normal. No respiratory distress.  Musculoskeletal: Normal range of motion.  Neurological: He is alert and oriented to person, place, and time. Gait normal.  Skin: Skin is warm and dry. Rash noted. There is erythema. No  pallor.  Lesions to top of right shoulder remain unchanged.  The area continues with erythema, mild edema, and some tenderness with palpation.  There are no red streaks to site.  Also, the circular patch of rash to the right upper abdomen has increased in both size and erythema since yesterday's exam.  Also, patient has some trace red patches to his lower right abdomen as well.  Psychiatric: Affect normal.  Nursing note and vitals reviewed.   LABORATORY DATA:. Appointment on 10/07/2015  Component Date Value Ref Range Status  . WBC 10/07/2015 3.0* 4.0 - 10.3 10e3/uL Final  . NEUT# 10/07/2015 0.7* 1.5 - 6.5 10e3/uL Final  . HGB 10/07/2015 10.5* 13.0 - 17.1 g/dL Final  . HCT 10/07/2015 31.5* 38.4 - 49.9 % Final  . Platelets 10/07/2015 132* 140 - 400 10e3/uL Final  . MCV 10/07/2015 105.0* 79.3 - 98.0 fL Final  . MCH 10/07/2015 35.0* 27.2 - 33.4 pg Final  . MCHC 10/07/2015 33.3  32.0 - 36.0 g/dL Final  . RBC 10/07/2015 3.00* 4.20 - 5.82 10e6/uL Final  . RDW 10/07/2015 21.9* 11.0 - 14.6 % Final  . lymph# 10/07/2015 1.7  0.9 - 3.3 10e3/uL Final  . MONO# 10/07/2015 0.6  0.1 - 0.9 10e3/uL Final  . Eosinophils Absolute 10/07/2015 0.0  0.0 - 0.5 10e3/uL Final  . Basophils Absolute 10/07/2015 0.0  0.0 - 0.1 10e3/uL Final  . NEUT% 10/07/2015 22.8* 39.0 - 75.0 % Final  . LYMPH% 10/07/2015 56.5* 14.0 - 49.0 % Final  . MONO% 10/07/2015 19.4* 0.0 - 14.0 % Final  . EOS% 10/07/2015 0.3  0.0 - 7.0 % Final  . BASO% 10/07/2015 1.0  0.0 - 2.0 % Final  . nRBC 10/07/2015 4* 0 - 0 % Final  . Sodium 10/07/2015 135* 136 - 145 mEq/L Final  . Potassium 10/07/2015 4.5  3.5 - 5.1 mEq/L Final  . Chloride 10/07/2015 105  98 - 109 mEq/L Final  . CO2 10/07/2015 24  22 - 29 mEq/L Final  . Glucose 10/07/2015 83  70 - 140 mg/dl Final   Glucose reference range is for nonfasting patients. Fasting glucose reference range is 70- 100.  Marland Kitchen BUN 10/07/2015 12.9  7.0 - 26.0 mg/dL Final  . Creatinine 10/07/2015 0.9  0.7 - 1.3  mg/dL Final  . Total Bilirubin 10/07/2015 0.95  0.20 - 1.20 mg/dL Final  . Alkaline Phosphatase 10/07/2015 48  40 - 150 U/L Final  . AST 10/07/2015 23  5 - 34 U/L Final  . ALT 10/07/2015 15  0 -  55 U/L Final  . Total Protein 10/07/2015 7.2  6.4 - 8.3 g/dL Final  . Albumin 10/07/2015 4.4  3.5 - 5.0 g/dL Final  . Calcium 10/07/2015 9.0  8.4 - 10.4 mg/dL Final  . Anion Gap 10/07/2015 6  3 - 11 mEq/L Final  . EGFR 10/07/2015 80* >90 ml/min/1.73 m2 Final   eGFR is calculated using the CKD-EPI Creatinine Equation (2009)   Right abdomen:       RADIOGRAPHIC STUDIES: No results found.  ASSESSMENT/PLAN:    MDS (myelodysplastic syndrome) ([place]) Pt returns this morning for cycle 1, day # 3 of his vidaza IV.   He is scheduled to return both 1/12 and 10/11/15 for the rest of the vidaza IV treatments.   He is scheduled to return for labs and visit on 10/14/15.   Rash  In for brief check of rash with patient this morning. Patient's right top shoulder continues with mild to moderate erythema, trace warmth, and edema. Patient has a Band-Aid over the primary lesion to the top of the right shoulder.  There are no red streaks noted.  Also, circular rash 2.  The right upper abdomen does appear to have increased since yesterday's visit. Also, patient has some of this same trace rash to the right lower abdominal area as well. Does not appear to be blistering at this point.   Differential diagnosis includes cellulitis to the right top shoulder lesions , shingles , and irritated dermatitis.   Patient has plans to visit with his dermatologist later this afternoon for further evaluation and management. We will send a copy of the notes from this week to the dermatologist for review.    In the meantime- patient confirmed that he did pick up of the Keflex antibiotics and the valacyclovir in case this was shingles.  Patient stated understanding of all instructions; and was in agreement with this plan of care.  The patient knows to call the clinic with any problems, questions or concerns.   Review/collaboration with Dr. Julien Nordmann regarding all aspects of patient's visit today.   Total time spent with patient was 25 minutes;  with greater than 75 percent of that time spent in face to face counseling regarding patient's symptoms,  and coordination of care and follow up.  Disclaimer:This dictation was prepared with Dragon/digital dictation along with [employer]. Any transcriptional errors that result from this process are unintentional.  Drue Second, NP 10/09/2015

## 2015-10-09 NOTE — Assessment & Plan Note (Signed)
Pt returns this morning for cycle 1, day # 3 of his vidaza IV.   He is scheduled to return both 1/12 and 10/11/15 for the rest of the vidaza IV treatments.   He is scheduled to return for labs and visit on 10/14/15.

## 2015-10-09 NOTE — Telephone Encounter (Signed)
Pt advised he is doing great, informed pt to expect call from scheduling regarding PAC placement

## 2015-10-09 NOTE — Assessment & Plan Note (Signed)
Patient received cycle 1, day 1 of his Vidaza treatments, IV on 10/07/2015.  The plan is for the patient to receive the Vidaza on days 1 through 5 of an every 28 day cycle.  Also, patient received his last Aranesp injection on 09/16/2015.  Blood counts obtained on 10/07/2015 revealed a WBC of 3.0, ANC 0.7, hemoglobin 10.5, and platelet count 132.  Patient has been instructed regarding neutropenic precautions; and he has been wearing a mask while in the cancer center.  Patient received a Granix injection on 10/02/2015 as well.  Patient is scheduled to return on January 11 through 10/11/2015 to complete 5 days of the vidaza IV.   Patient will be scheduled to return for labs and a follow-up visit on 10/14/2015.  He will also continue with weekly labs.

## 2015-10-09 NOTE — Assessment & Plan Note (Addendum)
Patient has a history of chronic, diffuse rash to his body for the last 6-8 years.  He states that this rash is continually pruritic for him.  He has been followed closely by his dermatologist, Dr. Sherley Bounds.  Patient states he's used multiple creams to his rash in the past.  Patient notes that he did developed a slightly different type of rash/lesions to it.  The top of his right shoulder following his first day of the [place] IV.  He states that these lesions are slightly pruritic; they're also quite painful.  He states that he experiences increased pain when he shirt touches these lesions.  Exam today revealed.  Patient does have a day, diffuse slightly raised rash to his entire body.  He has several rashes, lesions that are scabbed from him scratching.  Patient also has what appears to be the same type of rash, lesion to the top of his right shoulder; but this area does appear inflamed with erythema, mild edema, and tenderness with palpation.  There are no red streaks to the site.  The area is slightly warm.  Also, during exam.  It was noted the patient has a red circular rash to his right upper abdomen as well.  The concern is that patient may be developing shingles or cellulitis secondary to his compromised immune system.  Please see accompanying photos.  Obtained both a wound culture and viral cultures of the right shoulder lesion.  Results are pending.  After reviewing all findings with Dr. Brunilda Payor will be prescribed both Keflex antibiotics and valacyclovir until culture results are obtained.  Also, patient has an appointment with his dermatologist for tomorrow, 10/09/2015 at 4 PM.  Have called Dr. Adah Salvage office already doing.  Form of the cancer Center findings; we will also fax this note to him as well.  Will follow back up with the patient tomorrow to see if there is any change in his rash.

## 2015-10-09 NOTE — Progress Notes (Signed)
SYMPTOM MANAGEMENT CLINIC   HPI: Dean Stanley 82 y.o. male diagnosed with myelodysplastic syndrome.  Currently undergoing Vidaza , intravenous therapy.  Patient is also receive Aranesp injections recently as well.   Patient has a history of chronic, diffuse rash to his body for the last 6-8 years.  He states that this rash is continually pruritic for him.  He has been followed closely by his dermatologist, Dr. Sherley Bounds.  Patient states he's used multiple creams to his rash in the past.  Patient notes that he did developed a slightly different type of rash/lesions to it.  The top of his right shoulder following his first day of the [place] IV.  He states that these lesions are slightly pruritic; they're also quite painful.  He states that he experiences increased pain when he shirt touches these lesions.  Exam today revealed.  Patient does have a day, diffuse slightly raised rash to his entire body.  He has several rashes, lesions that are scabbed from him scratching.  Patient also has what appears to be the same type of rash, lesion to the top of his right shoulder; but this area does appear inflamed with erythema, mild edema, and tenderness with palpation.  There are no red streaks to the site.  The area is slightly warm.  Also, during exam.  It was noted the patient has a red circular rash to his right upper abdomen as well.  The concern is that patient may be developing shingles or cellulitis secondary to his compromised immune system.  Please see accompanying photos.  Obtained both a wound culture and viral cultures of the right shoulder lesion.  Results are pending.  After reviewing all findings with Dr. Brunilda Payor will be prescribed both Keflex antibiotics and valacyclovir until culture results are obtained.  Also, patient has an appointment with his dermatologist for tomorrow, 10/09/2015 at 4 PM.  Have called Dr. Adah Salvage office already doing.  Form of the cancer Center  findings; we will also fax this note to him as well.  Will follow back up with the patient tomorrow to see if there is any change in his rash.  HPI  ROS  Past Medical History  Diagnosis Date  . Meniere's syndrome 1980s  . History of ITP 1960  . Anemia   . Complication of anesthesia     Reaction to anesthesia " things went haywire around me"  . Hypothyroidism   . Hearing impairment     wears hearing left ear    Past Surgical History  Procedure Laterality Date  . Colonoscopy    . Cortisone injections to back x 6    . Circumcision    . Cataract Right 2011  . Orif orbital fracture Left 2000    2 fractures orbital platform    has Deficiency anemia; Meniere's syndrome; Neutropenia ([place]); Leukocytopenia; MDS (myelodysplastic syndrome) ([place]); and Rash on his problem list.    is allergic to hydrocodone and oxycodone.    Medication List       This list is accurate as of: 10/08/15 11:59 PM.  Always use your most recent med list.               multivitamin-lutein Caps capsule  Take 40 capsules by mouth daily.     CENTRUM ADULTS PO  Take 1 each by mouth.     cephALEXin 500 MG capsule  Commonly known as:  KEFLEX  Take 1 capsule (500 mg total) by mouth 4 (four) times daily.  desmopressin 0.2 MG tablet  Commonly known as:  DDAVP  Take 0.2 mg by mouth daily.     ferrous sulfate 325 (65 FE) MG tablet  Take 325 mg by mouth daily with breakfast.     finasteride 1 MG tablet  Commonly known as:  PROPECIA  Take 1 mg by mouth daily.     levothyroxine 200 MCG tablet  Commonly known as:  SYNTHROID, LEVOTHROID  Take 200 mcg by mouth daily.     meloxicam 7.5 MG tablet  Commonly known as:  MOBIC  Take 7.5 mg by mouth daily.     prochlorperazine 10 MG tablet  Commonly known as:  COMPAZINE  Take 1 tablet (10 mg total) by mouth every 8 (eight) hours as needed for nausea or vomiting.     valACYclovir 1000 MG tablet  Commonly known as:  VALTREX  Take 1 tablet (1,000 mg  total) by mouth 3 (three) times daily.         PHYSICAL EXAMINATION  Oncology Vitals 10/09/2015 10/07/2015  Height - -  Weight - -  Weight (lbs) - -  BMI (kg/m2) - -  Temp 97.5 98.7  Pulse 87 66  Resp 20 18  SpO2 98 100  BSA (m2) - -   BP Readings from Last 2 Encounters:  10/09/15 116/58  10/07/15 115/62    Physical Exam  Constitutional: He is oriented to person, place, and time and well-developed, well-nourished, and in no distress.  HENT:  Head: Normocephalic and atraumatic.  Eyes: Conjunctivae and EOM are normal. Pupils are equal, round, and reactive to light. Right eye exhibits no discharge. Left eye exhibits no discharge. No scleral icterus.  Neck: Normal range of motion.  Pulmonary/Chest: Effort normal. No respiratory distress.  Musculoskeletal: Normal range of motion. He exhibits no edema or tenderness.  Neurological: He is alert and oriented to person, place, and time. Gait normal.  Skin: Skin is warm and dry. Rash noted. There is erythema. No pallor.  Exam today revealed.  Patient does have a day, diffuse slightly raised rash to his entire body.  He has several rashes, lesions that are scabbed from him scratching.  Patient also has what appears to be the same type of rash, lesion to the top of his right shoulder; but this area does appear inflamed with erythema, mild edema, and tenderness with palpation.  There are no red streaks to the site.  The area is slightly warm.  Also, during exam.  It was noted the patient has a red circular rash to his right upper abdomen as well.       Psychiatric: Affect normal.  Nursing note and vitals reviewed.   LABORATORY DATA:. Appointment on 10/07/2015  Component Date Value Ref Range Status  . WBC 10/07/2015 3.0* 4.0 - 10.3 10e3/uL Final  . NEUT# 10/07/2015 0.7* 1.5 - 6.5 10e3/uL Final  . HGB 10/07/2015 10.5* 13.0 - 17.1 g/dL Final  . HCT 10/07/2015 31.5* 38.4 - 49.9 % Final  . Platelets 10/07/2015 132* 140 - 400 10e3/uL Final   . MCV 10/07/2015 105.0* 79.3 - 98.0 fL Final  . MCH 10/07/2015 35.0* 27.2 - 33.4 pg Final  . MCHC 10/07/2015 33.3  32.0 - 36.0 g/dL Final  . RBC 10/07/2015 3.00* 4.20 - 5.82 10e6/uL Final  . RDW 10/07/2015 21.9* 11.0 - 14.6 % Final  . lymph# 10/07/2015 1.7  0.9 - 3.3 10e3/uL Final  . MONO# 10/07/2015 0.6  0.1 - 0.9 10e3/uL Final  . Eosinophils Absolute 10/07/2015  0.0  0.0 - 0.5 10e3/uL Final  . Basophils Absolute 10/07/2015 0.0  0.0 - 0.1 10e3/uL Final  . NEUT% 10/07/2015 22.8* 39.0 - 75.0 % Final  . LYMPH% 10/07/2015 56.5* 14.0 - 49.0 % Final  . MONO% 10/07/2015 19.4* 0.0 - 14.0 % Final  . EOS% 10/07/2015 0.3  0.0 - 7.0 % Final  . BASO% 10/07/2015 1.0  0.0 - 2.0 % Final  . nRBC 10/07/2015 4* 0 - 0 % Final  . Sodium 10/07/2015 135* 136 - 145 mEq/L Final  . Potassium 10/07/2015 4.5  3.5 - 5.1 mEq/L Final  . Chloride 10/07/2015 105  98 - 109 mEq/L Final  . CO2 10/07/2015 24  22 - 29 mEq/L Final  . Glucose 10/07/2015 83  70 - 140 mg/dl Final   Glucose reference range is for nonfasting patients. Fasting glucose reference range is 70- 100.  Marland Kitchen BUN 10/07/2015 12.9  7.0 - 26.0 mg/dL Final  . Creatinine 10/07/2015 0.9  0.7 - 1.3 mg/dL Final  . Total Bilirubin 10/07/2015 0.95  0.20 - 1.20 mg/dL Final  . Alkaline Phosphatase 10/07/2015 48  40 - 150 U/L Final  . AST 10/07/2015 23  5 - 34 U/L Final  . ALT 10/07/2015 15  0 - 55 U/L Final  . Total Protein 10/07/2015 7.2  6.4 - 8.3 g/dL Final  . Albumin 10/07/2015 4.4  3.5 - 5.0 g/dL Final  . Calcium 10/07/2015 9.0  8.4 - 10.4 mg/dL Final  . Anion Gap 10/07/2015 6  3 - 11 mEq/L Final  . EGFR 10/07/2015 80* >90 ml/min/1.73 m2 Final   eGFR is calculated using the CKD-EPI Creatinine Equation (2009)   Right shoulder:       RADIOGRAPHIC STUDIES: No results found.  ASSESSMENT/PLAN:    MDS (myelodysplastic syndrome) ([place]) Patient received cycle 1, day 1 of his Vidaza treatments, IV on 10/07/2015.  The plan is for the patient to receive the  Vidaza on days 1 through 5 of an every 28 day cycle.  Also, patient received his last Aranesp injection on 09/16/2015.  Blood counts obtained on 10/07/2015 revealed a WBC of 3.0, ANC 0.7, hemoglobin 10.5, and platelet count 132.  Patient has been instructed regarding neutropenic precautions; and he has been wearing a mask while in the cancer center.  Patient received a Granix injection on 10/02/2015 as well.  Patient is scheduled to return on January 11 through 10/11/2015 to complete 5 days of the vidaza IV.   Patient will be scheduled to return for labs and a follow-up visit on 10/14/2015.  He will also continue with weekly labs.  Rash Patient has a history of chronic, diffuse rash to his body for the last 6-8 years.  He states that this rash is continually pruritic for him.  He has been followed closely by his dermatologist, Dr. Sherley Bounds.  Patient states he's used multiple creams to his rash in the past.  Patient notes that he did developed a slightly different type of rash/lesions to it.  The top of his right shoulder following his first day of the [place] IV.  He states that these lesions are slightly pruritic; they're also quite painful.  He states that he experiences increased pain when he shirt touches these lesions.  Exam today revealed.  Patient does have a day, diffuse slightly raised rash to his entire body.  He has several rashes, lesions that are scabbed from him scratching.  Patient also has what appears to be the same type of  rash, lesion to the top of his right shoulder; but this area does appear inflamed with erythema, mild edema, and tenderness with palpation.  There are no red streaks to the site.  The area is slightly warm.  Also, during exam.  It was noted the patient has a red circular rash to his right upper abdomen as well.  The concern is that patient may be developing shingles or cellulitis secondary to his compromised immune system.  Please see accompanying  photos.  Obtained both a wound culture and viral cultures of the right shoulder lesion.  Results are pending.  After reviewing all findings with Dr. Brunilda Payor will be prescribed both Keflex antibiotics and valacyclovir until culture results are obtained.  Also, patient has an appointment with his dermatologist for tomorrow, 10/09/2015 at 4 PM.  Have called Dr. Adah Salvage office already doing.  Form of the cancer Center findings; we will also fax this note to him as well.  Will follow back up with the patient tomorrow to see if there is any change in his rash.   Patient stated understanding of all instructions; and was in agreement with this plan of care. The patient knows to call the clinic with any problems, questions or concerns.   Review/collaboration with Dr. Julien Nordmann regarding all aspects of patient's visit today.   Total time spent with patient was 25 minutes;  with greater than 75 percent of that time spent in face to face counseling regarding patient's symptoms,  and coordination of care and follow up.  Disclaimer:This dictation was prepared with Dragon/digital dictation along with [employer]. Any transcriptional errors that result from this process are unintentional.  Drue Second, NP 10/09/2015

## 2015-10-09 NOTE — Patient Instructions (Signed)
[facility] Discharge Instructions for Patients Receiving Chemotherapy  Today you received the following chemotherapy agents: Vidaza   To help prevent nausea and vomiting after your treatment, we encourage you to take your nausea medication as prescribed by your physician.   If you develop nausea and vomiting that is not controlled by your nausea medication, call the clinic.   BELOW ARE SYMPTOMS THAT SHOULD BE REPORTED IMMEDIATELY:  *FEVER GREATER THAN 100.5 F  *CHILLS WITH OR WITHOUT FEVER  NAUSEA AND VOMITING THAT IS NOT CONTROLLED WITH YOUR NAUSEA MEDICATION  *UNUSUAL SHORTNESS OF BREATH  *UNUSUAL BRUISING OR BLEEDING  TENDERNESS IN MOUTH AND THROAT WITH OR WITHOUT PRESENCE OF ULCERS  *URINARY PROBLEMS  *BOWEL PROBLEMS  UNUSUAL RASH Items with * indicate a potential emergency and should be followed up as soon as possible.  Feel free to call the clinic you have any questions or concerns. The clinic phone number is [phone].  Please show the CHEMO ALERT CARD at check-in to the Emergency Department and triage nurse.   

## 2015-10-10 ENCOUNTER — Ambulatory Visit (HOSPITAL_BASED_OUTPATIENT_CLINIC_OR_DEPARTMENT_OTHER): Payer: Medicare Other

## 2015-10-10 ENCOUNTER — Telehealth: Payer: Self-pay | Admitting: *Deleted

## 2015-10-10 VITALS — BP 128/58 | HR 74 | Temp 97.9°F

## 2015-10-10 DIAGNOSIS — D469 Myelodysplastic syndrome, unspecified: Secondary | ICD-10-CM

## 2015-10-10 DIAGNOSIS — Z5111 Encounter for antineoplastic chemotherapy: Secondary | ICD-10-CM

## 2015-10-10 LAB — AEROBIC CULTURE

## 2015-10-10 MED ORDER — SODIUM CHLORIDE 0.9 % IV SOLN
Freq: Once | INTRAVENOUS | Status: AC
  Administered 2015-10-10: 14:00:00 via INTRAVENOUS
  Filled 2015-10-10: qty 4

## 2015-10-10 MED ORDER — LACTATED RINGERS IV SOLN
75.0000 mg/m2 | Freq: Every day | INTRAVENOUS | Status: DC
  Administered 2015-10-10: 150 mg via INTRAVENOUS
  Filled 2015-10-10: qty 30

## 2015-10-10 MED ORDER — SODIUM CHLORIDE 0.9 % IV SOLN
Freq: Once | INTRAVENOUS | Status: AC
  Administered 2015-10-10: 14:00:00 via INTRAVENOUS

## 2015-10-10 NOTE — Patient Instructions (Signed)
[facility] Discharge Instructions for Patients Receiving Chemotherapy  Today you received the following chemotherapy agents Vidaza  To help prevent nausea and vomiting after your treatment, we encourage you to take your nausea medication as prescribed.    If you develop nausea and vomiting that is not controlled by your nausea medication, call the clinic.   BELOW ARE SYMPTOMS THAT SHOULD BE REPORTED IMMEDIATELY:  *FEVER GREATER THAN 100.5 F  *CHILLS WITH OR WITHOUT FEVER  NAUSEA AND VOMITING THAT IS NOT CONTROLLED WITH YOUR NAUSEA MEDICATION  *UNUSUAL SHORTNESS OF BREATH  *UNUSUAL BRUISING OR BLEEDING  TENDERNESS IN MOUTH AND THROAT WITH OR WITHOUT PRESENCE OF ULCERS  *URINARY PROBLEMS  *BOWEL PROBLEMS  UNUSUAL RASH Items with * indicate a potential emergency and should be followed up as soon as possible.  Feel free to call the clinic you have any questions or concerns. The clinic phone number is [phone].  Please show the CHEMO ALERT CARD at check-in to the Emergency Department and triage nurse.   

## 2015-10-10 NOTE — Telephone Encounter (Signed)
TC to pt cell phone to advise lab results from rash on pt shoulder. No answer. Called to notify dermatologist results-  Results from Aerobic culture faxed to Dr. Ronnald Ramp- Dermatology [phone]

## 2015-10-11 ENCOUNTER — Ambulatory Visit (HOSPITAL_BASED_OUTPATIENT_CLINIC_OR_DEPARTMENT_OTHER): Payer: Medicare Other | Admitting: Nurse Practitioner

## 2015-10-11 ENCOUNTER — Ambulatory Visit (HOSPITAL_BASED_OUTPATIENT_CLINIC_OR_DEPARTMENT_OTHER): Payer: Medicare Other

## 2015-10-11 ENCOUNTER — Telehealth: Payer: Self-pay | Admitting: Nurse Practitioner

## 2015-10-11 ENCOUNTER — Encounter: Payer: Self-pay | Admitting: Nurse Practitioner

## 2015-10-11 ENCOUNTER — Other Ambulatory Visit: Payer: Self-pay | Admitting: Nurse Practitioner

## 2015-10-11 VITALS — BP 107/60 | HR 74 | Temp 97.7°F

## 2015-10-11 DIAGNOSIS — D469 Myelodysplastic syndrome, unspecified: Secondary | ICD-10-CM

## 2015-10-11 DIAGNOSIS — R21 Rash and other nonspecific skin eruption: Secondary | ICD-10-CM

## 2015-10-11 DIAGNOSIS — Z5111 Encounter for antineoplastic chemotherapy: Secondary | ICD-10-CM | POA: Diagnosis present

## 2015-10-11 MED ORDER — LEVOFLOXACIN 500 MG PO TABS
500.0000 mg | ORAL_TABLET | Freq: Every day | ORAL | Status: DC
Start: 2015-10-11 — End: 2015-11-04

## 2015-10-11 MED ORDER — LACTATED RINGERS IV SOLN
75.0000 mg/m2 | Freq: Every day | INTRAVENOUS | Status: DC
  Administered 2015-10-11: 150 mg via INTRAVENOUS
  Filled 2015-10-11: qty 30

## 2015-10-11 MED ORDER — SODIUM CHLORIDE 0.9 % IV SOLN
Freq: Once | INTRAVENOUS | Status: AC
  Administered 2015-10-11: 14:00:00 via INTRAVENOUS
  Filled 2015-10-11: qty 4

## 2015-10-11 MED ORDER — SODIUM CHLORIDE 0.9 % IV SOLN
Freq: Once | INTRAVENOUS | Status: AC
  Administered 2015-10-11: 13:00:00 via INTRAVENOUS

## 2015-10-11 MED ORDER — NYSTATIN-TRIAMCINOLONE 100000-0.1 UNIT/GM-% EX OINT: 1.0000 "application " | TOPICAL_OINTMENT | Freq: Two times a day (BID) | CUTANEOUS | Status: DC

## 2015-10-11 NOTE — Patient Instructions (Signed)
[facility] Discharge Instructions for Patients Receiving Chemotherapy  Today you received the following chemotherapy agents Vidaza  To help prevent nausea and vomiting after your treatment, we encourage you to take your nausea medication Compazine 10 mg every 6 hours as needed  If you develop nausea and vomiting that is not controlled by your nausea medication, call the clinic.   BELOW ARE SYMPTOMS THAT SHOULD BE REPORTED IMMEDIATELY:  *FEVER GREATER THAN 100.5 F  *CHILLS WITH OR WITHOUT FEVER  NAUSEA AND VOMITING THAT IS NOT CONTROLLED WITH YOUR NAUSEA MEDICATION  *UNUSUAL SHORTNESS OF BREATH  *UNUSUAL BRUISING OR BLEEDING  TENDERNESS IN MOUTH AND THROAT WITH OR WITHOUT PRESENCE OF ULCERS  *URINARY PROBLEMS  *BOWEL PROBLEMS  UNUSUAL RASH Items with * indicate a potential emergency and should be followed up as soon as possible.  Feel free to call the clinic you have any questions or concerns. The clinic phone number is [phone].  Please show the CHEMO ALERT CARD at check-in to the Emergency Department and triage nurse.   

## 2015-10-11 NOTE — Telephone Encounter (Signed)
Spoke with Dr. Ronnald Ramp dermatologist regarding rash; and + staph results. He agreed with switch to levaquin from previous keflex.   He will continue to see pt as needed for follow up.

## 2015-10-11 NOTE — Progress Notes (Signed)
SYMPTOM MANAGEMENT CLINIC   HPI: Dean Stanley 82 y.o. male diagnosed with  Myelodysplastic syndrome.  Currently undergoing Vidaza  And Aranesp therapy.  Patient presented to the [facility] today to receive cycle 1, day 5 of his bike days a infusion.  Patient request.  Brief recheck of his rash and follow-up.  Since he saw his dermatologist earlier this week.   Rash    Review of Systems  Skin: Positive for rash.    Past Medical History  Diagnosis Date  . Meniere's syndrome 1980s  . History of ITP 1960  . Anemia   . Complication of anesthesia     Reaction to anesthesia " things went haywire around me"  . Hypothyroidism   . Hearing impairment     wears hearing left ear    Past Surgical History  Procedure Laterality Date  . Colonoscopy    . Cortisone injections to back x 6    . Circumcision    . Cataract Right 2011  . Orif orbital fracture Left 2000    2 fractures orbital platform    has Deficiency anemia; Meniere's syndrome; Neutropenia ([place]); Leukocytopenia; MDS (myelodysplastic syndrome) ([place]); and Rash on his problem list.    is allergic to hydrocodone and oxycodone.    Medication List       This list is accurate as of: 10/11/15 10:00 PM.  Always use your most recent med list.               CENTRUM ADULTS PO  Take 1 each by mouth.     desmopressin 0.2 MG tablet  Commonly known as:  DDAVP  Take 0.2 mg by mouth daily.     ferrous sulfate 325 (65 FE) MG tablet  Take 325 mg by mouth daily with breakfast.     finasteride 1 MG tablet  Commonly known as:  PROPECIA  Take 1 mg by mouth daily.     levofloxacin 500 MG tablet  Commonly known as:  LEVAQUIN  Take 1 tablet (500 mg total) by mouth daily.     levothyroxine 200 MCG tablet  Commonly known as:  SYNTHROID, LEVOTHROID  Take 200 mcg by mouth daily.     Lutein 40 MG Caps  Take 40 mg by mouth daily.     nystatin-triamcinolone ointment  Commonly known as:  MYCOLOG  Apply 1  application topically 2 (two) times daily.     prochlorperazine 10 MG tablet  Commonly known as:  COMPAZINE  Take 1 tablet (10 mg total) by mouth every 8 (eight) hours as needed for nausea or vomiting.     valACYclovir 1000 MG tablet  Commonly known as:  VALTREX  Take 1 tablet (1,000 mg total) by mouth 3 (three) times daily.         PHYSICAL EXAMINATION  Oncology Vitals 10/11/2015 10/10/2015  Height - -  Weight - -  Weight (lbs) - -  BMI (kg/m2) - -  Temp 97.7 97.9  Pulse 74 74  Resp - -  SpO2 96 96  BSA (m2) - -   BP Readings from Last 2 Encounters:  10/11/15 107/60  10/10/15 128/58    Physical Exam  Constitutional: He is oriented to person, place, and time and well-developed, well-nourished, and in no distress.  HENT:  Head: Normocephalic and atraumatic.  Eyes: Conjunctivae and EOM are normal. Pupils are equal, round, and reactive to light. Right eye exhibits no discharge. Left eye exhibits no discharge. No scleral icterus.  Neck:  Normal range of motion.  Pulmonary/Chest: Effort normal. No respiratory distress.  Musculoskeletal: Normal range of motion.  Neurological: He is alert and oriented to person, place, and time. Gait normal.  Skin: Skin is warm and dry. Rash noted. There is erythema. No pallor.  Exam today reveals that the top of patient's right shoulder lesion is slowly resolving.  There is much less irritation, erythema, edema, or tenderness to the site.  There is also no warmth, or red streaks.  There is no further oozing to the wound.  Also, patients right upper abdomen cluster rash is slightly improved.  However, patient does have extension of this new rash to his entire groin area.         Psychiatric: Affect normal.  Nursing note and vitals reviewed.   LABORATORY DATA:. No visits with results within 3 Day(s) from this visit. Latest known visit with results is:  Orders Only on 10/08/2015  Component Date Value Ref Range Status  . Aerobic  Bacterial Culture 10/08/2015 Final report*  Final  . Result 1 10/08/2015 Staphylococcus aureus*  Final   Comment: Moderate growth Based on susceptibility to both penicillin and oxacillin this isolate would be susceptible to: * Penicillins; such as:     Amoxicillin     Ampicillin     Piperacillin     Cloxacillin     Dicloxacillin * Beta-lactam/beta-lactamase inhibitor combinations; such as:     Amoxicillin-clavulanic acid     Ampicillin-sulbactam * Antistaphylococcal cephems; such as:     Cefaclor     Cefuroxime * Antistaphylococcal carbapenems; such as:     Imipenem     Meropenem Most isolates of Staphylococcus sp. produce a beta-lactamase enzyme rendering them resistant to penicillin. Please contact the laboratory if penicillin is being considered for therapy.   . ANTIMICROBIAL SUSCEPTIBILITY 10/08/2015 Comment   Final   Comment:       ** S = Susceptible; I = Intermediate; R = Resistant **                    P = Positive; N = Negative             MICS are expressed in micrograms per mL    Antibiotic                 RSLT#1    RSLT#2    RSLT#3    RSLT#4 Ciprofloxacin                  S Clindamycin                    S Erythromycin                   S Gentamicin                     S Levofloxacin                   S Linezolid                      S Moxifloxacin                   S Oxacillin                      S Quinupristin/Dalfopristin      S Rifampin  S Tetracycline                   S Trimethoprim/Sulfa             S Vancomycin                     S   . Viral Culture: 10/08/2015 Comment   Preliminary   Comment: Preliminary Report: No virus isolated at 24 hours. Next report to follow after 4 days.    RADIOGRAPHIC STUDIES: No results found.  ASSESSMENT/PLAN:    MDS (myelodysplastic syndrome) ([place]) Patient received cycle 1, day 1 of his Vidaza treatments, IV on 10/07/2015.  The plan is for the patient to receive the Vidaza on days 1 through 5  of an every 28 day cycle.  Also, patient received his last Aranesp injection on 09/16/2015.  Blood counts obtained on 10/07/2015 revealed a WBC of 3.0, ANC 0.7, hemoglobin 10.5, and platelet count 132.  Patient has been instructed regarding neutropenic precautions; and he has been wearing a mask while in the cancer center.  Patient received a Granix injection on 10/02/2015 as well.  Patient is scheduled to return on January 11 through 10/11/2015 to complete 5 days of the vidaza IV.   Patient will be scheduled to return for labs and a follow-up visit on 10/14/2015.  He will also continue with weekly labs.    Rash In for brief check of rash with patient this morning. Patient's right top shoulder continues with mild to moderate erythema, trace warmth, and edema. Patient has a Band-Aid over the primary lesion to the top of the right shoulder.  There are no red streaks noted.  Also, circular rash 2.  The right upper abdomen does appear to have increased since yesterday's visit. Also, patient has some of this same trace rash to the right lower abdominal area as well. Does not appear to be blistering at this point.   Differential diagnosis includes cellulitis to the right top shoulder lesions , shingles , and irritated dermatitis.   Patient has plans to visit with his dermatologist later this afternoon for further evaluation and management. We will send a copy of the notes from this week to the dermatologist for review.    In the meantime- patient confirmed that he did pick up of the Keflex antibiotics and the valacyclovir in case this was shingles. _______________________________________________________  Update: Patient saw his dermatologist, Dr. Sherley Bounds on Wednesday, 10/09/2015.  Patient reports that Dr. Ronnald Ramp.  Obtained cultures from his right shoulder lesion as well; and those culture results are still pending.  Patient was given triamcinolone cream to apply to his generalized chronic rash  as well.  Patient states that different/new rash to his right upper abdomen has slightly improved; he now has this new rash to his entire groin area.  He continues to complain of pruritus to the rash as well.  Exam today reveals that the top of patient's right shoulder lesion is slowly resolving.  There is much less irritation, erythema, edema, or tenderness to the site.  There is also no warmth, or red streaks.  There is no further oozing to the wound.  Also, patients right upper abdomen cluster rash is slightly improved.  However, patient does have extension of this new rash to his entire groin area.  Right shoulder lesion culture resulted this morning; revealing staph.  Patient has been taking Keflex for the last 2-1/2 days with some improvement to the right shoulder  lesion.  However, culture results reveal a sensitivity to Levaquin.  Discussed culture results by telephone with Dr. Ronnald Ramp dermatologist; and he agreed that Levaquin was a good choice to try for treatment of the lesion.  Also, gave patient instructions regarding Benadryl and Pepcid for treatment of the slightly different/new rash to the right upper abdomen and groin area.  Quite possibly, the chronic rash.  Patient has had to his generalized body for the last several years may actually be a different type of rash within the new onset rash.  Patient has developed to his right abdomen and groin area within the past few days.  He has developed a mild allergic reaction/rash to the 5 days of infusion.  Patient was advised to call/return directly to the emergency department for worsening symptoms whatsoever.  May need to consider a prednisone taper pack is abdominal and groin rash continues or worsens.  Patient has plans to return for labs and a follow-up visit on Monday, 10/14/2015.  Will re--examined.  Patient's rash at that time.   Patient stated understanding of all instructions; and was in agreement with this plan of care. The  patient knows to call the clinic with any problems, questions or concerns.   Review/collaboration with Dr. Julien Nordmann regarding all aspects of patient's visit today.   Total time spent with patient was 25 minutes;  with greater than 75 percent of that time spent in face to face counseling regarding patient's symptoms,  and coordination of care and follow up.  Disclaimer:This dictation was prepared with Dragon/digital dictation along with [employer]. Any transcriptional errors that result from this process are unintentional.  Drue Second, NP 10/11/2015

## 2015-10-11 NOTE — Progress Notes (Signed)
[place] Social Work  Clinical Social Work was referred by patient's spouse to review and complete healthcare advance directives.  Clinical Social Worker met with patient and patient's spouse in infusion room.  The patient designated spouse Pamala Hurry as their primary healthcare agent and Torin Judd as their secondary agent.  Patient also completed healthcare living will.    Clinical Social Worker notarized documents and made copies for patient/family. Clinical Social Worker will send documents to medical records to be scanned into patient's chart. Clinical Social Worker encouraged patient/family to contact with any additional questions or concerns.  Polo Riley, MSW, [place] Worker [facility] [phone]

## 2015-10-11 NOTE — Assessment & Plan Note (Signed)
In for brief check of rash with patient this morning. Patient's right top shoulder continues with mild to moderate erythema, trace warmth, and edema. Patient has a Band-Aid over the primary lesion to the top of the right shoulder.  There are no red streaks noted.  Also, circular rash 2.  The right upper abdomen does appear to have increased since yesterday's visit. Also, patient has some of this same trace rash to the right lower abdominal area as well. Does not appear to be blistering at this point.   Differential diagnosis includes cellulitis to the right top shoulder lesions , shingles , and irritated dermatitis.   Patient has plans to visit with his dermatologist later this afternoon for further evaluation and management. We will send a copy of the notes from this week to the dermatologist for review.    In the meantime- patient confirmed that he did pick up of the Keflex antibiotics and the valacyclovir in case this was shingles. _______________________________________________________  Update: Patient saw his dermatologist, Dr. Sherley Bounds on Wednesday, 10/09/2015.  Patient reports that Dr. Ronnald Ramp.  Obtained cultures from his right shoulder lesion as well; and those culture results are still pending.  Patient was given triamcinolone cream to apply to his generalized chronic rash as well.  Patient states that different/new rash to his right upper abdomen has slightly improved; he now has this new rash to his entire groin area.  He continues to complain of pruritus to the rash as well.  Exam today reveals that the top of patient's right shoulder lesion is slowly resolving.  There is much less irritation, erythema, edema, or tenderness to the site.  There is also no warmth, or red streaks.  There is no further oozing to the wound.  Also, patients right upper abdomen cluster rash is slightly improved.  However, patient does have extension of this new rash to his entire groin area.  Right shoulder  lesion culture resulted this morning; revealing staph.  Patient has been taking Keflex for the last 2-1/2 days with some improvement to the right shoulder lesion.  However, culture results reveal a sensitivity to Levaquin.  Discussed culture results by telephone with Dr. Ronnald Ramp dermatologist; and he agreed that Levaquin was a good choice to try for treatment of the lesion.  Also, gave patient instructions regarding Benadryl and Pepcid for treatment of the slightly different/new rash to the right upper abdomen and groin area.  Quite possibly, the chronic rash.  Patient has had to his generalized body for the last several years may actually be a different type of rash within the new onset rash.  Patient has developed to his right abdomen and groin area within the past few days.  He has developed a mild allergic reaction/rash to the 5 days of infusion.  Patient was advised to call/return directly to the emergency department for worsening symptoms whatsoever.  May need to consider a prednisone taper pack is abdominal and groin rash continues or worsens.  Patient has plans to return for labs and a follow-up visit on Monday, 10/14/2015.  Will re--examined.  Patient's rash at that time.

## 2015-10-11 NOTE — Assessment & Plan Note (Signed)
Patient received cycle 1, day 1 of his Vidaza treatments, IV on 10/07/2015.  The plan is for the patient to receive the Vidaza on days 1 through 5 of an every 28 day cycle.  Also, patient received his last Aranesp injection on 09/16/2015.  Blood counts obtained on 10/07/2015 revealed a WBC of 3.0, ANC 0.7, hemoglobin 10.5, and platelet count 132.  Patient has been instructed regarding neutropenic precautions; and he has been wearing a mask while in the cancer center.  Patient received a Granix injection on 10/02/2015 as well.  Patient is scheduled to return on January 11 through 10/11/2015 to complete 5 days of the vidaza IV.   Patient will be scheduled to return for labs and a follow-up visit on 10/14/2015.  He will also continue with weekly labs.

## 2015-10-11 NOTE — Progress Notes (Signed)
Seen by Selena Lesser, NP in [facility] after treatment for f/u to 'worsening rash' symptoms -now in groin area.

## 2015-10-12 ENCOUNTER — Encounter (HOSPITAL_COMMUNITY): Payer: Self-pay | Admitting: Emergency Medicine

## 2015-10-12 ENCOUNTER — Emergency Department (HOSPITAL_COMMUNITY)
Admission: EM | Admit: 2015-10-12 | Discharge: 2015-10-12 | Disposition: A | Discharge status: Home / Self Care | Payer: Medicare Other | Attending: Emergency Medicine | Admitting: Emergency Medicine

## 2015-10-12 DIAGNOSIS — Z79899 Other long term (current) drug therapy: Secondary | ICD-10-CM | POA: Diagnosis not present

## 2015-10-12 DIAGNOSIS — K5900 Constipation, unspecified: Secondary | ICD-10-CM | POA: Diagnosis present

## 2015-10-12 DIAGNOSIS — H9190 Unspecified hearing loss, unspecified ear: Secondary | ICD-10-CM | POA: Insufficient documentation

## 2015-10-12 DIAGNOSIS — Z7952 Long term (current) use of systemic steroids: Secondary | ICD-10-CM | POA: Insufficient documentation

## 2015-10-12 DIAGNOSIS — Z792 Long term (current) use of antibiotics: Secondary | ICD-10-CM | POA: Insufficient documentation

## 2015-10-12 DIAGNOSIS — E039 Hypothyroidism, unspecified: Secondary | ICD-10-CM | POA: Diagnosis not present

## 2015-10-12 DIAGNOSIS — D649 Anemia, unspecified: Secondary | ICD-10-CM | POA: Insufficient documentation

## 2015-10-12 HISTORY — DX: Malignant (primary) neoplasm, unspecified: C80.1

## 2015-10-12 MED ORDER — LIDOCAINE HCL 2 % EX GEL
1.0000 "application " | Freq: Once | CUTANEOUS | Status: AC
  Administered 2015-10-12: 1 via TOPICAL

## 2015-10-12 MED ORDER — LORAZEPAM 2 MG/ML IJ SOLN
1.0000 mg | Freq: Once | INTRAMUSCULAR | Status: AC
  Administered 2015-10-12: 1 mg via INTRAMUSCULAR
  Filled 2015-10-12: qty 1

## 2015-10-12 MED ORDER — LIDOCAINE HCL 2 % EX GEL
1.0000 "application " | Freq: Once | CUTANEOUS | Status: AC
  Administered 2015-10-12: 1 via TOPICAL
  Filled 2015-10-12: qty 11

## 2015-10-12 NOTE — ED Provider Notes (Signed)
CSN: [id number]     Arrival date & time 10/12/15  1606 History   First MD Initiated Contact with Patient 10/12/15 1643     Chief Complaint  Patient presents with  . Constipation  . Rectal Pain     (Consider location/radiation/quality/duration/timing/severity/associated sxs/prior Treatment) HPI Comments: Patient presents with constipation. He has a history of myelodysplastic syndrome. He is currently receiving chemotherapy. He states he hasn't had bowel movement in 5 days. He has a lot of pressure and discomfort to his rectal area. He denies abdominal pain. He has some mild nausea but no vomiting. He denies any fevers. He does have a past history of constipation, usually when taking pain medications. He is not currently taking opioids. He's tried Senokot and MiraLAX without relief.  Patient is a 82 y.o. male presenting with constipation.  Constipation Associated symptoms: no abdominal pain, no back pain, no diarrhea, no fever, no nausea and no vomiting     Past Medical History  Diagnosis Date  . Meniere's syndrome 1980s  . History of ITP 1960  . Anemia   . Complication of anesthesia     Reaction to anesthesia " things went haywire around me"  . Hypothyroidism   . Hearing impairment     wears hearing left ear  . Cancer [facility])     myelodysplastic syndrome   Past Surgical History  Procedure Laterality Date  . Colonoscopy    . Cortisone injections to back x 6    . Circumcision    . Cataract Right 2011  . Orif orbital fracture Left 2000    2 fractures orbital platform   Family History  Problem Relation Age of Onset  . Pancreatic cancer Father   . Colon cancer Brother   . Throat cancer Other    Social History  Substance Use Topics  . Smoking status: Never Smoker   . Smokeless tobacco: None  . Alcohol Use: Yes     Comment: Occasional    Review of Systems  Constitutional: Negative for fever, chills, diaphoresis and fatigue.  HENT: Negative for congestion, rhinorrhea and  sneezing.   Eyes: Negative.   Respiratory: Negative for cough, chest tightness and shortness of breath.   Cardiovascular: Negative for chest pain and leg swelling.  Gastrointestinal: Positive for constipation and rectal pain. Negative for nausea, vomiting, abdominal pain, diarrhea and blood in stool.  Genitourinary: Negative for frequency, hematuria, flank pain and difficulty urinating.  Musculoskeletal: Negative for back pain and arthralgias.  Skin: Negative for rash.  Neurological: Negative for dizziness, speech difficulty, weakness, numbness and headaches.      Allergies  Hydrocodone and Oxycodone  Home Medications   Prior to Admission medications   Medication Sig Start Date End Date Taking? Authorizing Provider  desmopressin (DDAVP) 0.2 MG tablet Take 0.2 mg by mouth daily.   Yes Historical Provider, MD  diphenhydrAMINE (BENADRYL) 25 MG tablet Take 25 mg by mouth every 6 (six) hours as needed for itching.   Yes Historical Provider, MD  ferrous sulfate 325 (65 FE) MG tablet Take 325 mg by mouth daily with breakfast.   Yes Historical Provider, MD  finasteride (PROPECIA) 1 MG tablet Take 1 mg by mouth daily.   Yes Historical Provider, MD  levofloxacin (LEVAQUIN) 500 MG tablet Take 1 tablet (500 mg total) by mouth daily. 10/11/15  Yes Susanne Borders, NP  levothyroxine (SYNTHROID, LEVOTHROID) 200 MCG tablet Take 200 mcg by mouth daily.   Yes Historical Provider, MD  Lutein 40 MG CAPS Take  40 mg by mouth daily. 10/09/15  Yes Historical Provider, MD  Multiple Vitamins-Minerals (CENTRUM ADULTS PO) Take 1 tablet by mouth daily.    Yes Historical Provider, MD  nystatin-triamcinolone ointment (MYCOLOG) Apply 1 application topically 2 (two) times daily. 10/11/15  Yes Danella Sensing, MD  polyethylene glycol [facility] Floria Raveling) packet Take 17 g by mouth daily as needed for mild constipation.   Yes Historical Provider, MD  senna-docusate (SENNA S) 8.6-50 MG tablet Take 2 tablets by mouth every 6  (six) hours as needed for mild constipation.   Yes Historical Provider, MD  valACYclovir (VALTREX) 1000 MG tablet Take 1 tablet (1,000 mg total) by mouth 3 (three) times daily. 10/08/15  Yes Susanne Borders, NP  prochlorperazine (COMPAZINE) 10 MG tablet Take 1 tablet (10 mg total) by mouth every 8 (eight) hours as needed for nausea or vomiting. 10/07/15   Curt Bears, MD   BP 122/52 mmHg  Pulse 72  Temp(Src) 98.7 F (37.1 C) (Oral)  Resp 16  Ht 6' (1.829 m)  Wt 179 lb (81.194 kg)  BMI 24.27 kg/m2  SpO2 97% Physical Exam  Constitutional: He is oriented to person, place, and time. He appears well-developed and well-nourished.  HENT:  Head: Normocephalic and atraumatic.  Eyes: Pupils are equal, round, and reactive to light.  Neck: Normal range of motion. Neck supple.  Cardiovascular: Normal rate, regular rhythm and normal heart sounds.   Pulmonary/Chest: Effort normal and breath sounds normal. No respiratory distress. He has no wheezes. He has no rales. He exhibits no tenderness.  Abdominal: Soft. Bowel sounds are normal. There is no tenderness. There is no rebound and no guarding.  Genitourinary:  +diffuse pain to rectal area, no masses palpated.  No gross blood.  No impaction  Musculoskeletal: Normal range of motion. He exhibits no edema.  Lymphadenopathy:    He has no cervical adenopathy.  Neurological: He is alert and oriented to person, place, and time.  Skin: Skin is warm and dry. No rash noted.  Psychiatric: He has a normal mood and affect.    ED Course  Procedures (including critical care time) Labs Review Labs Reviewed - No data to display  Imaging Review No results found. I have personally reviewed and evaluated these images and lab results as part of my medical decision-making.   EKG Interpretation None      MDM   Final diagnoses:  Constipation, unspecified constipation type    Patient presents with constipation. He did have a lot of tenderness to his  rectal area but it was more generalized and there is no other suggestions of a perirectal abscess. I did try to disimpact some stool and was mildly successful. However he had a lot of tenderness. He was given a soapsuds enema and was able to have a large bowel movement after this. He's currently feeling much better. He states his rectal pain is markedly improved. He was discharged home in good condition. He was advised to follow-up with his oncologist at his symptoms are not improving or return here as needed if he has worsening symptoms.    Malvin Johns, MD 10/12/15 1950

## 2015-10-12 NOTE — Discharge Instructions (Signed)

## 2015-10-12 NOTE — ED Notes (Signed)
Pt states that he started a new chemo on Monday.  States that he has not had a BM in 5 days.  Denies abd pain but is complaining of rectal pain.  Denies vomiting.  States he has been passing "hardly any gas" but has been burping a lot.

## 2015-10-12 NOTE — ED Notes (Signed)
Pt family member is becoming upset, she keeps coming out to the nurses station asking why they are still here and that they want to be discharged. Staff apologized for the wait and explained that the provider has not put the patient up for discharge yet.

## 2015-10-14 ENCOUNTER — Telehealth: Payer: Self-pay | Admitting: Internal Medicine

## 2015-10-14 ENCOUNTER — Ambulatory Visit (HOSPITAL_BASED_OUTPATIENT_CLINIC_OR_DEPARTMENT_OTHER): Payer: Medicare Other | Admitting: Nurse Practitioner

## 2015-10-14 ENCOUNTER — Other Ambulatory Visit (HOSPITAL_BASED_OUTPATIENT_CLINIC_OR_DEPARTMENT_OTHER): Payer: Medicare Other

## 2015-10-14 ENCOUNTER — Encounter: Payer: Self-pay | Admitting: Nurse Practitioner

## 2015-10-14 VITALS — BP 135/60 | HR 80 | Temp 98.3°F | Resp 18 | Ht 72.0 in | Wt 179.6 lb

## 2015-10-14 DIAGNOSIS — D469 Myelodysplastic syndrome, unspecified: Secondary | ICD-10-CM

## 2015-10-14 DIAGNOSIS — D4620 Refractory anemia with excess of blasts, unspecified: Secondary | ICD-10-CM

## 2015-10-14 DIAGNOSIS — D696 Thrombocytopenia, unspecified: Secondary | ICD-10-CM

## 2015-10-14 DIAGNOSIS — R21 Rash and other nonspecific skin eruption: Secondary | ICD-10-CM

## 2015-10-14 DIAGNOSIS — D701 Agranulocytosis secondary to cancer chemotherapy: Secondary | ICD-10-CM | POA: Diagnosis present

## 2015-10-14 DIAGNOSIS — D539 Nutritional anemia, unspecified: Secondary | ICD-10-CM

## 2015-10-14 DIAGNOSIS — D638 Anemia in other chronic diseases classified elsewhere: Secondary | ICD-10-CM

## 2015-10-14 DIAGNOSIS — D709 Neutropenia, unspecified: Secondary | ICD-10-CM

## 2015-10-14 LAB — COMPREHENSIVE METABOLIC PANEL
ALBUMIN: 4.2 g/dL (ref 3.5–5.0)
ALT: 15 U/L (ref 0–55)
ANION GAP: 8 meq/L (ref 3–11)
AST: 20 U/L (ref 5–34)
Alkaline Phosphatase: 50 U/L (ref 40–150)
BILIRUBIN TOTAL: 0.91 mg/dL (ref 0.20–1.20)
BUN: 15.2 mg/dL (ref 7.0–26.0)
CALCIUM: 9.1 mg/dL (ref 8.4–10.4)
CHLORIDE: 106 meq/L (ref 98–109)
CO2: 26 meq/L (ref 22–29)
Creatinine: 0.9 mg/dL (ref 0.7–1.3)
EGFR: 76 mL/min/{1.73_m2} — ABNORMAL LOW (ref >90)
GLUCOSE: 92 mg/dL (ref 70–140)
POTASSIUM: 4.2 meq/L (ref 3.5–5.1)
Sodium: 139 mEq/L (ref 136–145)
Total Protein: 7.2 g/dL (ref 6.4–8.3)

## 2015-10-14 LAB — CBC WITH DIFFERENTIAL/PLATELET
BASO%: 1.6 % (ref 0.0–2.0)
BASOS ABS: 0 10*3/uL (ref 0.0–0.1)
EOS ABS: 0 10*3/uL (ref 0.0–0.5)
EOS%: 0.4 % (ref 0.0–7.0)
HEMATOCRIT: 28.2 % — AB (ref 38.4–49.9)
HEMOGLOBIN: 9.4 g/dL — AB (ref 13.0–17.1)
LYMPH#: 1.2 10*3/uL (ref 0.9–3.3)
LYMPH%: 72.9 % — ABNORMAL HIGH (ref 14.0–49.0)
MCH: 35.5 pg — AB (ref 27.2–33.4)
MCHC: 33.3 g/dL (ref 32.0–36.0)
MCV: 106.8 fL — ABNORMAL HIGH (ref 79.3–98.0)
MONO#: 0.1 10*3/uL (ref 0.1–0.9)
MONO%: 8.6 % (ref 0.0–14.0)
NEUT#: 0.3 10*3/uL — CL (ref 1.5–6.5)
NEUT%: 16.5 % — ABNORMAL LOW (ref 39.0–75.0)
PLATELETS: 79 10*3/uL — AB (ref 140–400)
RBC: 2.65 10*6/uL — ABNORMAL LOW (ref 4.20–5.82)
RDW: 23.1 % — AB (ref 11.0–14.6)
WBC: 1.7 10*3/uL — ABNORMAL LOW (ref 4.0–10.3)

## 2015-10-14 MED ORDER — DARBEPOETIN ALFA 300 MCG/0.6ML IJ SOSY
300.0000 ug | PREFILLED_SYRINGE | Freq: Once | INTRAMUSCULAR | Status: AC
  Administered 2015-10-14: 300 ug via SUBCUTANEOUS
  Filled 2015-10-14: qty 0.6

## 2015-10-14 MED ORDER — TBO-FILGRASTIM 300 MCG/0.5ML ~~LOC~~ SOSY
300.0000 ug | PREFILLED_SYRINGE | Freq: Once | SUBCUTANEOUS | Status: AC
  Administered 2015-10-14: 300 ug via SUBCUTANEOUS
  Filled 2015-10-14: qty 0.5

## 2015-10-14 NOTE — Patient Instructions (Addendum)
Darbepoetin Alfa injection What is this medicine? DARBEPOETIN ALFA (dar be POE e tin AL fa) helps your body make more red blood cells. It is used to treat anemia caused by chronic kidney failure and chemotherapy. This medicine may be used for other purposes; ask your health care provider or pharmacist if you have questions. What should I tell my health care provider before I take this medicine? They need to know if you have any of these conditions: -blood clotting disorders or history of blood clots -cancer patient not on chemotherapy -cystic fibrosis -heart disease, such as angina, heart failure, or a history of a heart attack -hemoglobin level of 12 g/dL or greater -high blood pressure -low levels of folate, iron, or vitamin B12 -seizures -an unusual or allergic reaction to darbepoetin, erythropoietin, albumin, hamster proteins, latex, other medicines, foods, dyes, or preservatives -pregnant or trying to get pregnant -breast-feeding How should I use this medicine? This medicine is for injection into a vein or under the skin. It is usually given by a health care professional in a hospital or clinic setting. If you get this medicine at home, you will be taught how to prepare and give this medicine. Do not shake the solution before you withdraw a dose. Use exactly as directed. Take your medicine at regular intervals. Do not take your medicine more often than directed. It is important that you put your used needles and syringes in a special sharps container. Do not put them in a trash can. If you do not have a sharps container, call your pharmacist or healthcare provider to get one. Talk to your pediatrician regarding the use of this medicine in children. While this medicine may be used in children as young as 1 year for selected conditions, precautions do apply. Overdosage: If you think you have taken too much of this medicine contact a poison control center or emergency room at once. NOTE:  This medicine is only for you. Do not share this medicine with others. What if I miss a dose? If you miss a dose, take it as soon as you can. If it is almost time for your next dose, take only that dose. Do not take double or extra doses. What may interact with this medicine? Do not take this medicine with any of the following medications: -epoetin alfa This list may not describe all possible interactions. Give your health care provider a list of all the medicines, herbs, non-prescription drugs, or dietary supplements you use. Also tell them if you smoke, drink alcohol, or use illegal drugs. Some items may interact with your medicine. What should I watch for while using this medicine? Visit your prescriber or health care professional for regular checks on your progress and for the needed blood tests and blood pressure measurements. It is especially important for the doctor to make sure your hemoglobin level is in the desired range, to limit the risk of potential side effects and to give you the best benefit. Keep all appointments for any recommended tests. Check your blood pressure as directed. Ask your doctor what your blood pressure should be and when you should contact him or her. As your body makes more red blood cells, you may need to take iron, folic acid, or vitamin B supplements. Ask your doctor or health care provider which products are right for you. If you have kidney disease continue dietary restrictions, even though this medication can make you feel better. Talk with your doctor or health care professional about the   foods you eat and the vitamins that you take. What side effects may I notice from receiving this medicine? Side effects that you should report to your doctor or health care professional as soon as possible: -allergic reactions like skin rash, itching or hives, swelling of the face, lips, or tongue -breathing problems -changes in vision -chest pain -confusion, trouble speaking  or understanding -feeling faint or lightheaded, falls -high blood pressure -muscle aches or pains -pain, swelling, warmth in the leg -rapid weight gain -severe headaches -sudden numbness or weakness of the face, arm or leg -trouble walking, dizziness, loss of balance or coordination -seizures (convulsions) -swelling of the ankles, feet, hands -unusually weak or tired Side effects that usually do not require medical attention (report to your doctor or health care professional if they continue or are bothersome): -diarrhea -fever, chills (flu-like symptoms) -headaches -nausea, vomiting -redness, stinging, or swelling at site where injected This list may not describe all possible side effects. Call your doctor for medical advice about side effects. You may report side effects to FDA at 1-800-FDA-1088. Where should I keep my medicine? Keep out of the reach of children. Store in a refrigerator between 2 and 8 degrees C (36 and 46 degrees F). Do not freeze. Do not shake. Throw away any unused portion if using a single-dose vial. Throw away any unused medicine after the expiration date. NOTE: This sheet is a summary. It may not cover all possible information. If you have questions about this medicine, talk to your doctor, pharmacist, or health care provider.    2016, Elsevier/Gold Standard. (2008-08-28 10:23:57) Tbo-Filgrastim injection What is this medicine? TBO-FILGRASTIM (T B O fil GRA stim) is a granulocyte colony-stimulating factor that stimulates the growth of neutrophils, a type of white blood cell important in the body's fight against infection. It is used to reduce the incidence of fever and infection in patients with certain types of cancer who are receiving chemotherapy that affects the bone marrow. This medicine may be used for other purposes; ask your health care provider or pharmacist if you have questions. What should I tell my health care provider before I take this  medicine? They need to know if you have any of these conditions: -ongoing radiation therapy -sickle cell anemia -an unusual or allergic reaction to tbo-filgrastim, filgrastim, pegfilgrastim, other medicines, foods, dyes, or preservatives -pregnant or trying to get pregnant -breast-feeding How should I use this medicine? This medicine is for injection under the skin. If you get this medicine at home, you will be taught how to prepare and give this medicine. Refer to the Instructions for Use that come with your medication packaging. Use exactly as directed. Take your medicine at regular intervals. Do not take your medicine more often than directed. It is important that you put your used needles and syringes in a special sharps container. Do not put them in a trash can. If you do not have a sharps container, call your pharmacist or healthcare provider to get one. Talk to your pediatrician regarding the use of this medicine in children. Special care may be needed. Overdosage: If you think you have taken too much of this medicine contact a poison control center or emergency room at once. NOTE: This medicine is only for you. Do not share this medicine with others. What if I miss a dose? It is important not to miss your dose. Call your doctor or health care professional if you miss a dose. What may interact with this medicine? This medicine   may interact with the following medications: -medicines that may cause a release of neutrophils, such as lithium This list may not describe all possible interactions. Give your health care provider a list of all the medicines, herbs, non-prescription drugs, or dietary supplements you use. Also tell them if you smoke, drink alcohol, or use illegal drugs. Some items may interact with your medicine. What should I watch for while using this medicine? You may need blood work done while you are taking this medicine. What side effects may I notice from receiving this  medicine? Side effects that you should report to your doctor or health care professional as soon as possible: -allergic reactions like skin rash, itching or hives, swelling of the face, lips, or tongue -shortness of breath or breathing problems -fever -pain, redness, or irritation at site where injected -pinpoint red spots on the skin -stomach or side pain, or pain at the shoulder -swelling -tiredness -trouble passing urine Side effects that usually do not require medical attention (Report these to your doctor or health care professional if they continue or are bothersome.): -bone pain -muscle pain This list may not describe all possible side effects. Call your doctor for medical advice about side effects. You may report side effects to FDA at 1-800-FDA-1088. Where should I keep my medicine? Keep out of the reach of children. Store in a refrigerator between 2 and 8 degrees C (36 and 46 degrees F). Keep in carton to protect from light. Throw away this medicine if it is left out of the refrigerator for more than 5 consecutive days. Throw away any unused medicine after the expiration date. NOTE: This sheet is a summary. It may not cover all possible information. If you have questions about this medicine, talk to your doctor, pharmacist, or health care provider.    2016, Elsevier/Gold Standard. (2014-01-04 11:52:29)  

## 2015-10-14 NOTE — Telephone Encounter (Signed)
per pof to sch pt appt-gave pt copy of avs °

## 2015-10-14 NOTE — Progress Notes (Addendum)
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 Aranesp 300 g subcutaneously every 3 weeks in addition to Granix 300 mcg subcutaneously for neutropenia as needed.    INTERVAL HISTORY:    Dean Stanley returns as scheduled. He completed cycle one 5-azacytidine again in 10/07/2015. He denies nausea/vomiting. No mouth sores. No diarrhea. He did develop significant constipation. He was seen in the emergency department on 10/12/2015 with a partial disimpaction performed. He also received a soapsuds enema. The rash at the right shoulder continues to improve. Right lower abdomen/groin rash is also better. No fever, sweats or chills.  Objective:  Vital signs in last 24 hours:  Blood pressure 135/60, pulse 80, temperature 98.3 F (36.8 C), temperature source Oral, resp. rate 18, height 6' (1.829 m), weight 179 lb 9.6 oz (81.466 kg), SpO2 98 %.    HEENT:  No thrush or ulcers. Resp:  Lungs clear bilaterally. Cardio:  Regular rate and rhythm. GI:  Abdomen soft and nontender. No hepatomegaly. No splenomegaly. Vascular:  No leg edema. Neuro:  Alert and oriented. Hard of hearing.  Skin:  Resolving rash at the right shoulder and low abdomen/groin regions.    Lab Results:  Lab Results  Component Value Date   WBC 1.7* 10/14/2015   HGB 9.4* 10/14/2015   HCT 28.2* 10/14/2015   MCV 106.8* 10/14/2015   PLT 79* 10/14/2015   NEUTROABS 0.3* 10/14/2015    Imaging:  No results found.  Medications: I have reviewed the patient's current medications.  Assessment/Plan: 1. Myelodysplastic syndrome status post cycle one 5-azacytidine beginning 10/07/2015; Aranesp every 3 weeks and granix as needed. 2. Skin lesion right shoulder culture positive for  staph aureus. He is completing a course of Levaquin. 3. Skin rash low abdomen/groin. Appears to be resolving. 4. Constipation status post evaluation in the emergency department 10/12/2015.   Disposition: Dean Stanley appears stable. He has completed 1 cycle of 5-azacytidine. Plan to continue weekly labs, Aranesp every 3 weeks and Granix as needed. Per Dr. Worthy Flank recommendation he will receive Granix today and tomorrow. He is scheduled to begin cycle 2 5-azacytidine 11/04/2015. He has a follow-up appointment with Dr. Julien Nordmann prior to proceeding with treatment that day. He will contact the office prior to his next visit with any problems. We specifically discussed fever, chills, other signs of infection, bleeding.  We discussed a bowel regimen. He will begin miralax daily and Senokot-S 2 tablets twice daily. He will adjust the regimen if he develops loose stools.  Patient seen with Dr. Julien Nordmann.    Ned Card ANP/GNP-BC   10/14/2015  3:06 PM  ADDENDUM: Hematology/Oncology Attending: I had a face to face encounter with the patient today. I recommended his care plan. This is a very pleasant 82 years old white male recently diagnosed with myelodysplastic syndrome consistent with refractory anemia with excess blasts. The patient is currently undergoing systemic treatment with Vidaza status post 1 cycle. He tolerated the first cycle of his treatment fairly well with no significant adverse effect except for generalized skin rash with some questionable cellulitis in the right shoulder area. This was treated with a course of antibiotics and the patient is feeling much better. He was also seen by his dermatologist at that time agreed with the current plan and recommended changing antibiotics  to Levaquin. The patient came today for evaluation with repeat blood work which showed absolute neutrophil count of 300 and addition to anemia and thrombocytopenia. I recommended for the patient to receive 2 doses  of Granix for his low neutrophil count. He will also continue treatment with Aranesp for the anemia of chronic disease. The patient is expected to have a Port-A-Cath placed by interventional radiology next week. He would come back for follow-up visit in 3 weeks for evaluation before starting the next cycle of his treatment. The patient was advised to call immediately if he has any concerning symptoms in the interval.  Disclaimer: This note was dictated with voice recognition software. Similar sounding words can inadvertently be transcribed and may be missed upon review. Eilleen Kempf., MD 10/14/2015

## 2015-10-15 ENCOUNTER — Other Ambulatory Visit: Payer: Self-pay | Admitting: Medical Oncology

## 2015-10-15 ENCOUNTER — Ambulatory Visit (HOSPITAL_BASED_OUTPATIENT_CLINIC_OR_DEPARTMENT_OTHER): Payer: Medicare Other

## 2015-10-15 VITALS — BP 109/71 | HR 95 | Temp 98.5°F

## 2015-10-15 DIAGNOSIS — D469 Myelodysplastic syndrome, unspecified: Secondary | ICD-10-CM | POA: Diagnosis present

## 2015-10-15 DIAGNOSIS — D702 Other drug-induced agranulocytosis: Secondary | ICD-10-CM

## 2015-10-15 MED ORDER — TBO-FILGRASTIM 300 MCG/0.5ML ~~LOC~~ SOSY
300.0000 ug | PREFILLED_SYRINGE | Freq: Once | SUBCUTANEOUS | Status: DC
  Filled 2015-10-15: qty 0.5

## 2015-10-15 MED ORDER — TBO-FILGRASTIM 300 MCG/0.5ML ~~LOC~~ SOSY
300.0000 ug | PREFILLED_SYRINGE | Freq: Once | SUBCUTANEOUS | Status: AC
  Administered 2015-10-15: 300 ug via SUBCUTANEOUS
  Filled 2015-10-15: qty 0.5

## 2015-10-17 LAB — VIRUS CULTURE

## 2015-10-18 ENCOUNTER — Other Ambulatory Visit: Payer: Self-pay | Admitting: Radiology

## 2015-10-21 ENCOUNTER — Ambulatory Visit (HOSPITAL_COMMUNITY)
Admission: RE | Admit: 2015-10-21 | Discharge: 2015-10-21 | Disposition: A | Discharge status: Home / Self Care | Payer: Medicare Other | Source: Ambulatory Visit | Attending: Internal Medicine | Admitting: Internal Medicine

## 2015-10-21 ENCOUNTER — Other Ambulatory Visit: Payer: Self-pay | Admitting: Internal Medicine

## 2015-10-21 ENCOUNTER — Other Ambulatory Visit (HOSPITAL_BASED_OUTPATIENT_CLINIC_OR_DEPARTMENT_OTHER): Payer: Medicare Other

## 2015-10-21 DIAGNOSIS — D469 Myelodysplastic syndrome, unspecified: Secondary | ICD-10-CM | POA: Diagnosis not present

## 2015-10-21 DIAGNOSIS — D4620 Refractory anemia with excess of blasts, unspecified: Secondary | ICD-10-CM

## 2015-10-21 DIAGNOSIS — Z5309 Procedure and treatment not carried out because of other contraindication: Secondary | ICD-10-CM | POA: Insufficient documentation

## 2015-10-21 LAB — CBC WITH DIFFERENTIAL/PLATELET
BASO%: 0.4 % (ref 0.0–2.0)
BASOS ABS: 0 10*3/uL (ref 0.0–0.1)
EOS%: 0.5 % (ref 0.0–7.0)
Eosinophils Absolute: 0 10*3/uL (ref 0.0–0.5)
HEMATOCRIT: 28.4 % — AB (ref 38.4–49.9)
HGB: 9.5 g/dL — ABNORMAL LOW (ref 13.0–17.1)
LYMPH#: 1.3 10*3/uL (ref 0.9–3.3)
LYMPH%: 74.2 % — ABNORMAL HIGH (ref 14.0–49.0)
MCH: 36.1 pg — AB (ref 27.2–33.4)
MCHC: 33.6 g/dL (ref 32.0–36.0)
MCV: 107.5 fL — ABNORMAL HIGH (ref 79.3–98.0)
MONO#: 0.2 10*3/uL (ref 0.1–0.9)
MONO%: 13.7 % (ref 0.0–14.0)
NEUT#: 0.2 10*3/uL — CL (ref 1.5–6.5)
NEUT%: 11.2 % — AB (ref 39.0–75.0)
Platelets: 56 10*3/uL — ABNORMAL LOW (ref 140–400)
RBC: 2.64 10*6/uL — AB (ref 4.20–5.82)
RDW: 23.7 % — ABNORMAL HIGH (ref 11.0–14.6)
WBC: 1.7 10*3/uL — AB (ref 4.0–10.3)

## 2015-10-21 LAB — COMPREHENSIVE METABOLIC PANEL
ALT: 11 U/L (ref 0–55)
AST: 18 U/L (ref 5–34)
Albumin: 4.2 g/dL (ref 3.5–5.0)
Alkaline Phosphatase: 42 U/L (ref 40–150)
Anion Gap: 4 mEq/L (ref 3–11)
BUN: 14.4 mg/dL (ref 7.0–26.0)
CALCIUM: 8.8 mg/dL (ref 8.4–10.4)
CHLORIDE: 107 meq/L (ref 98–109)
CO2: 26 meq/L (ref 22–29)
CREATININE: 0.8 mg/dL (ref 0.7–1.3)
EGFR: 82 mL/min/{1.73_m2} — ABNORMAL LOW (ref >90)
GLUCOSE: 93 mg/dL (ref 70–140)
POTASSIUM: 4.7 meq/L (ref 3.5–5.1)
SODIUM: 137 meq/L (ref 136–145)
Total Bilirubin: 1.19 mg/dL (ref 0.20–1.20)
Total Protein: 7 g/dL (ref 6.4–8.3)

## 2015-10-21 MED ORDER — SODIUM CHLORIDE 0.9 % IV SOLN: INTRAVENOUS | Status: DC

## 2015-10-21 MED ORDER — CEFAZOLIN SODIUM-DEXTROSE 2-3 GM-% IV SOLR: 2.0000 g | Freq: Once | INTRAVENOUS | Status: DC

## 2015-10-21 NOTE — Progress Notes (Signed)
Pt arrived today ambulatory accompanied by wife.  He had lab work done at the [facility] prior to arrival. Reviewed lab results and saw the WBC was 1.7 and platelets were 56. I called IR and spoke to [place] who relayed this information to Dr Annamaria Boots. Ascencion Dike PA for Radiology was sent to Short Stay to speak with patient and wife

## 2015-10-21 NOTE — Progress Notes (Signed)
Pt presented for port placement for myelodysplastic syndrome. Had labs this am WBC still 1.7 with Neutrophils of 0.2 PLTs have also trended down to 56k Significant infection and bleed risk.  D/w Dr. Julien Nordmann, plan to defer port for now until counts come up. Procedure postponed for now D/w pt and wife, they will follow up with Dr. Zenda Alpers PA-C Interventional Radiology 10/21/2015 12:17 PM

## 2015-10-28 ENCOUNTER — Telehealth: Payer: Self-pay | Admitting: Medical Oncology

## 2015-10-28 ENCOUNTER — Other Ambulatory Visit (HOSPITAL_BASED_OUTPATIENT_CLINIC_OR_DEPARTMENT_OTHER): Payer: Medicare Other

## 2015-10-28 ENCOUNTER — Other Ambulatory Visit: Payer: Self-pay | Admitting: Internal Medicine

## 2015-10-28 DIAGNOSIS — D4620 Refractory anemia with excess of blasts, unspecified: Secondary | ICD-10-CM

## 2015-10-28 LAB — COMPREHENSIVE METABOLIC PANEL
ALT: 14 U/L (ref 0–55)
ANION GAP: 8 meq/L (ref 3–11)
AST: 16 U/L (ref 5–34)
Albumin: 4.4 g/dL (ref 3.5–5.0)
Alkaline Phosphatase: 48 U/L (ref 40–150)
BILIRUBIN TOTAL: 1 mg/dL (ref 0.20–1.20)
BUN: 10.8 mg/dL (ref 7.0–26.0)
CHLORIDE: 106 meq/L (ref 98–109)
CO2: 24 meq/L (ref 22–29)
Calcium: 9.1 mg/dL (ref 8.4–10.4)
Creatinine: 0.8 mg/dL (ref 0.7–1.3)
EGFR: 82 mL/min/{1.73_m2} — AB (ref >90)
Glucose: 90 mg/dl (ref 70–140)
POTASSIUM: 4.4 meq/L (ref 3.5–5.1)
Sodium: 139 mEq/L (ref 136–145)
TOTAL PROTEIN: 7.3 g/dL (ref 6.4–8.3)

## 2015-10-28 LAB — CBC WITH DIFFERENTIAL/PLATELET
BASO%: 0.9 % (ref 0.0–2.0)
BASOS ABS: 0 10*3/uL (ref 0.0–0.1)
EOS ABS: 0 10*3/uL (ref 0.0–0.5)
EOS%: 0 % (ref 0.0–7.0)
HCT: 27.3 % — ABNORMAL LOW (ref 38.4–49.9)
HGB: 9.2 g/dL — ABNORMAL LOW (ref 13.0–17.1)
LYMPH%: 67.7 % — AB (ref 14.0–49.0)
MCH: 36.1 pg — AB (ref 27.2–33.4)
MCHC: 33.7 g/dL (ref 32.0–36.0)
MCV: 107.1 fL — AB (ref 79.3–98.0)
MONO#: 0.5 10*3/uL (ref 0.1–0.9)
MONO%: 19.9 % — AB (ref 0.0–14.0)
NEUT#: 0.3 10*3/uL — CL (ref 1.5–6.5)
NEUT%: 11.5 % — AB (ref 39.0–75.0)
PLATELETS: 86 10*3/uL — AB (ref 140–400)
RBC: 2.55 10*6/uL — AB (ref 4.20–5.82)
RDW: 21.5 % — ABNORMAL HIGH (ref 11.0–14.6)
WBC: 2.3 10*3/uL — ABNORMAL LOW (ref 4.0–10.3)
lymph#: 1.5 10*3/uL (ref 0.9–3.3)
nRBC: 4 % — ABNORMAL HIGH (ref 0–0)

## 2015-10-28 NOTE — Telephone Encounter (Signed)
I left pt amessage telling him that Dr Julien Nordmann is considering changing his chemo from Iv to subc and he would not need a port this way. I told him his white count was low. i asked him to call back in am.

## 2015-11-04 ENCOUNTER — Ambulatory Visit (HOSPITAL_BASED_OUTPATIENT_CLINIC_OR_DEPARTMENT_OTHER): Payer: Medicare Other

## 2015-11-04 ENCOUNTER — Telehealth: Payer: Self-pay | Admitting: Internal Medicine

## 2015-11-04 ENCOUNTER — Encounter: Payer: Self-pay | Admitting: Internal Medicine

## 2015-11-04 ENCOUNTER — Telehealth: Payer: Self-pay | Admitting: *Deleted

## 2015-11-04 ENCOUNTER — Ambulatory Visit (HOSPITAL_BASED_OUTPATIENT_CLINIC_OR_DEPARTMENT_OTHER): Payer: Medicare Other | Admitting: Internal Medicine

## 2015-11-04 ENCOUNTER — Other Ambulatory Visit (HOSPITAL_BASED_OUTPATIENT_CLINIC_OR_DEPARTMENT_OTHER): Payer: Medicare Other

## 2015-11-04 ENCOUNTER — Ambulatory Visit: Payer: Medicare Other

## 2015-11-04 VITALS — BP 101/48 | HR 74 | Temp 98.0°F | Resp 18 | Ht 72.0 in | Wt 178.1 lb

## 2015-11-04 DIAGNOSIS — D701 Agranulocytosis secondary to cancer chemotherapy: Secondary | ICD-10-CM | POA: Diagnosis not present

## 2015-11-04 DIAGNOSIS — D539 Nutritional anemia, unspecified: Secondary | ICD-10-CM

## 2015-11-04 DIAGNOSIS — D4620 Refractory anemia with excess of blasts, unspecified: Secondary | ICD-10-CM | POA: Diagnosis present

## 2015-11-04 DIAGNOSIS — Z5111 Encounter for antineoplastic chemotherapy: Secondary | ICD-10-CM | POA: Diagnosis present

## 2015-11-04 DIAGNOSIS — D469 Myelodysplastic syndrome, unspecified: Secondary | ICD-10-CM

## 2015-11-04 DIAGNOSIS — Z66 Do not resuscitate: Secondary | ICD-10-CM

## 2015-11-04 DIAGNOSIS — T451X5A Adverse effect of antineoplastic and immunosuppressive drugs, initial encounter: Secondary | ICD-10-CM

## 2015-11-04 HISTORY — DX: Do not resuscitate: Z66

## 2015-11-04 LAB — CBC WITH DIFFERENTIAL/PLATELET
BASO%: 2.1 % — ABNORMAL HIGH (ref 0.0–2.0)
BASOS ABS: 0 10*3/uL (ref 0.0–0.1)
EOS ABS: 0 10*3/uL (ref 0.0–0.5)
EOS%: 0 % (ref 0.0–7.0)
HCT: 27.8 % — ABNORMAL LOW (ref 38.4–49.9)
HEMOGLOBIN: 9.1 g/dL — AB (ref 13.0–17.1)
LYMPH#: 1.2 10*3/uL (ref 0.9–3.3)
LYMPH%: 62.2 % — ABNORMAL HIGH (ref 14.0–49.0)
MCH: 35.5 pg — AB (ref 27.2–33.4)
MCHC: 32.7 g/dL (ref 32.0–36.0)
MCV: 108.6 fL — AB (ref 79.3–98.0)
MONO#: 0.4 10*3/uL (ref 0.1–0.9)
MONO%: 19.2 % — AB (ref 0.0–14.0)
NEUT#: 0.3 10*3/uL — CL (ref 1.5–6.5)
NEUT%: 16.5 % — AB (ref 39.0–75.0)
NRBC: 3 % — AB (ref 0–0)
PLATELETS: 129 10*3/uL — AB (ref 140–400)
RBC: 2.56 10*6/uL — ABNORMAL LOW (ref 4.20–5.82)
RDW: 22.3 % — AB (ref 11.0–14.6)
WBC: 1.9 10*3/uL — ABNORMAL LOW (ref 4.0–10.3)

## 2015-11-04 LAB — COMPREHENSIVE METABOLIC PANEL
ALBUMIN: 4.1 g/dL (ref 3.5–5.0)
ALK PHOS: 44 U/L (ref 40–150)
ALT: 12 U/L (ref 0–55)
ANION GAP: 8 meq/L (ref 3–11)
AST: 17 U/L (ref 5–34)
BILIRUBIN TOTAL: 0.78 mg/dL (ref 0.20–1.20)
BUN: 12.5 mg/dL (ref 7.0–26.0)
CO2: 23 mEq/L (ref 22–29)
Calcium: 8.6 mg/dL (ref 8.4–10.4)
Chloride: 108 mEq/L (ref 98–109)
Creatinine: 0.8 mg/dL (ref 0.7–1.3)
EGFR: 83 mL/min/{1.73_m2} — AB (ref >90)
Glucose: 77 mg/dl (ref 70–140)
POTASSIUM: 4.2 meq/L (ref 3.5–5.1)
SODIUM: 139 meq/L (ref 136–145)
TOTAL PROTEIN: 6.9 g/dL (ref 6.4–8.3)

## 2015-11-04 MED ORDER — SODIUM CHLORIDE 0.9 % IV SOLN
Freq: Once | INTRAVENOUS | Status: AC
  Administered 2015-11-04: 11:00:00 via INTRAVENOUS

## 2015-11-04 MED ORDER — AZACITIDINE CHEMO INJECTION 100 MG
75.0000 mg/m2 | Freq: Every day | INTRAMUSCULAR | Status: DC
  Administered 2015-11-04: 150 mg via INTRAVENOUS
  Filled 2015-11-04: qty 30

## 2015-11-04 MED ORDER — SODIUM CHLORIDE 0.9 % IV SOLN
Freq: Once | INTRAVENOUS | Status: AC
  Administered 2015-11-04: 12:00:00 via INTRAVENOUS
  Filled 2015-11-04: qty 4

## 2015-11-04 MED ORDER — DARBEPOETIN ALFA 300 MCG/0.6ML IJ SOSY
300.0000 ug | PREFILLED_SYRINGE | Freq: Once | INTRAMUSCULAR | Status: AC
  Administered 2015-11-04: 300 ug via SUBCUTANEOUS
  Filled 2015-11-04: qty 0.6

## 2015-11-04 NOTE — Progress Notes (Signed)
[facility] Telephone:([phone]   Fax:([phone]) [address] [place] [address]  DIAGNOSIS: 1)  myelodysplastic syndrome consistent with refractory anemia with excess blast diagnosed in December 2016 2)  History of ITP  PRIOR THERAPY: None  CURRENT THERAPY:  1) Vidaza 75 mg/m2 IV for 5 days every 4 weeks. Status post one cycle. 2) Supportive care with Aranesp 300 g subcutaneously every 3 weeks in addition to Granix 300 mcg subcutaneously for neutropenia as needed.  INTERVAL HISTORY: Dean Stanley 82 y.o. male returns to the clinic today for follow-up visit. The patient is feeling fine today with no specific complaints except for fatigue. He tolerated the first cycle of his treatment fairly well was no significant adverse effects. He continues to have pancytopenia. He has no fever or chills.  He has no bleeding issues. He denied having any significant chest pain, shortness breath, cough or hemoptysis. The patient denied having any weight loss or night sweats. He has no nausea or vomiting. He is here today to start cycle #2 of his systemic therapy.  MEDICAL HISTORY: Past Medical History  Diagnosis Date  . Meniere's syndrome 1980s  . History of ITP 1960  . Anemia   . Complication of anesthesia     Reaction to anesthesia " things went haywire around me"  . Hypothyroidism   . Hearing impairment     wears hearing left ear  . Cancer ([place])     myelodysplastic syndrome    ALLERGIES:  is allergic to hydrocodone and oxycodone.  MEDICATIONS:  Current Outpatient Prescriptions  Medication Sig Dispense Refill  . desmopressin (DDAVP) 0.2 MG tablet Take 0.2 mg by mouth daily.    . diphenhydrAMINE (BENADRYL) 25 MG tablet Take 25 mg by mouth every 6 (six) hours as needed for itching.    . famotidine (PEPCID) 20 MG tablet Take 20 mg by mouth 2 (two) times daily.    . ferrous sulfate 325 (65 FE) MG  tablet Take 325 mg by mouth daily with breakfast.    . finasteride (PROPECIA) 1 MG tablet Take 1 mg by mouth daily.    Marland Kitchen levothyroxine (SYNTHROID, LEVOTHROID) 200 MCG tablet Take 200 mcg by mouth daily.    Marland Kitchen lidocaine (XYLOCAINE) 2 % jelly 1 application as needed (for rectal area as needed).    . Lutein 40 MG CAPS Take 40 mg by mouth daily.    . Multiple Vitamins-Minerals (CENTRUM ADULTS PO) Take 1 tablet by mouth daily.     Marland Kitchen nystatin-triamcinolone ointment (MYCOLOG) Apply 1 application topically 2 (two) times daily. 30 g 0  . polyethylene glycol (MIRALAX / GLYCOLAX) packet Take 17 g by mouth daily as needed for mild constipation.    . senna-docusate (SENNA S) 8.6-50 MG tablet Take 2 tablets by mouth every 6 (six) hours as needed for mild constipation.    . prochlorperazine (COMPAZINE) 10 MG tablet Take 1 tablet (10 mg total) by mouth every 8 (eight) hours as needed for nausea or vomiting. (Patient not taking: Reported on 11/04/2015) 30 tablet 0   No current facility-administered medications for this visit.    SURGICAL HISTORY:  Past Surgical History  Procedure Laterality Date  . Colonoscopy    . Cortisone injections to back x 6    . Circumcision    . Cataract Right 2011  . Orif orbital fracture Left 2000    2 fractures orbital platform  REVIEW OF SYSTEMS:  Constitutional: positive for fatigue Eyes: negative Ears, nose, mouth, throat, and face: negative Respiratory: negative Cardiovascular: negative Gastrointestinal: negative Genitourinary:negative Integument/breast: negative Hematologic/lymphatic: negative Musculoskeletal:negative Neurological: negative Behavioral/Psych: negative Endocrine: negative Allergic/Immunologic: negative   PHYSICAL EXAMINATION: General appearance: alert, cooperative, fatigued and no distress Head: Normocephalic, without obvious abnormality, atraumatic Neck: no adenopathy, no JVD, supple, symmetrical, trachea midline and thyroid not enlarged,  symmetric, no tenderness/mass/nodules Lymph nodes: Cervical, supraclavicular, and axillary nodes normal. Resp: clear to auscultation bilaterally Back: symmetric, no curvature. ROM normal. No CVA tenderness. Cardio: regular rate and rhythm, S1, S2 normal, no murmur, click, rub or gallop GI: soft, non-tender; bowel sounds normal; no masses,  no organomegaly Extremities: extremities normal, atraumatic, no cyanosis or edema Neurologic: Alert and oriented X 3, normal strength and tone. Normal symmetric reflexes. Normal coordination and gait  ECOG PERFORMANCE STATUS: 1 - Symptomatic but completely ambulatory  Blood pressure 101/48, pulse 74, temperature 98 F (36.7 C), temperature source Oral, resp. rate 18, height 6' (1.829 m), weight 178 lb 1.6 oz (80.786 kg), SpO2 98 %.  LABORATORY DATA: Lab Results  Component Value Date   WBC 1.9* 11/04/2015   HGB 9.1* 11/04/2015   HCT 27.8* 11/04/2015   MCV 108.6* 11/04/2015   PLT 129* 11/04/2015      Chemistry      Component Value Date/Time   NA 139 11/04/2015 0952   NA 141 08/25/2006 1353   K 4.2 11/04/2015 0952   K 4.4 08/25/2006 1353   CL 104 08/25/2006 1353   CO2 23 11/04/2015 0952   CO2 29 08/25/2006 1353   BUN 12.5 11/04/2015 0952   BUN 14 08/25/2006 1353   CREATININE 0.8 11/04/2015 0952   CREATININE 0.90 08/25/2006 1353      Component Value Date/Time   CALCIUM 8.6 11/04/2015 0952   CALCIUM 9.6 08/25/2006 1353   ALKPHOS 44 11/04/2015 0952   ALKPHOS 45 08/25/2006 1353   AST 17 11/04/2015 0952   AST 18 08/25/2006 1353   ALT 12 11/04/2015 0952   ALT 17 08/25/2006 1353   BILITOT 0.78 11/04/2015 0952   BILITOT 1.2 08/25/2006 1353       RADIOGRAPHIC STUDIES: No results found.  ASSESSMENT AND PLAN:  This is a very pleasant 82 years old white male with recently diagnosed myelodysplastic syndrome consistent with refractory anemia with excess blast.  He is currently undergoing systemic chemotherapy with Vidaza 75 MG/M2 for 5 days  every 4 weeks is status post 1 cycle. He tolerated the first cycle of his treatment fairly well. I recommended for him to proceed with cycle #2 today as a scheduled. For the refractory anemia, I would consider the patient for treatment with Aranesp 300 g subcutaneously every 3 weeks. For the neutropenia, I will consider the patient for treatment with Neulasta after his chemotherapy. We will continue to monitor his CBC regularly with the Aranesp injection. I will see the patient back for follow-up visit in 4 weeks for reevaluation before starting cycle #3. He was advised to call immediately if he has any significant fever or chills or any other signs of infection during this period. The patient voices understanding of current disease status and treatment options and is in agreement with the current care plan.  All questions were answered. The patient knows to call the clinic with any problems, questions or concerns. We can certainly see the patient much sooner if necessary.  Disclaimer: This note was dictated with voice recognition software. Similar sounding words can  inadvertently be transcribed and may not be corrected upon review.       

## 2015-11-04 NOTE — Progress Notes (Signed)
Per Dr Mohamed it is okay to treat pt today with chemo and todays labs.  

## 2015-11-04 NOTE — Patient Instructions (Signed)
[facility] Discharge Instructions for Patients Receiving Chemotherapy  Today you received the following chemotherapy agents: Vidaza.  To help prevent nausea and vomiting after your treatment, we encourage you to take your nausea medication: Compazine 10 mg every 6 hours as needed.   If you develop nausea and vomiting that is not controlled by your nausea medication, call the clinic.   BELOW ARE SYMPTOMS THAT SHOULD BE REPORTED IMMEDIATELY:  *FEVER GREATER THAN 100.5 F  *CHILLS WITH OR WITHOUT FEVER  NAUSEA AND VOMITING THAT IS NOT CONTROLLED WITH YOUR NAUSEA MEDICATION  *UNUSUAL SHORTNESS OF BREATH  *UNUSUAL BRUISING OR BLEEDING  TENDERNESS IN MOUTH AND THROAT WITH OR WITHOUT PRESENCE OF ULCERS  *URINARY PROBLEMS  *BOWEL PROBLEMS  UNUSUAL RASH Items with * indicate a potential emergency and should be followed up as soon as possible.  Feel free to call the clinic you have any questions or concerns. The clinic phone number is [phone].  Please show the [place] at check-in to the Emergency Department and triage nurse.  Darbepoetin Alfa injection What is this medicine? DARBEPOETIN ALFA (dar be POE e tin AL fa) helps your body make more red blood cells. It is used to treat anemia caused by chronic kidney failure and chemotherapy. This medicine may be used for other purposes; ask your health care provider or pharmacist if you have questions. What should I tell my health care provider before I take this medicine? They need to know if you have any of these conditions: -blood clotting disorders or history of blood clots -cancer patient not on chemotherapy -cystic fibrosis -heart disease, such as angina, heart failure, or a history of a heart attack -hemoglobin level of 12 g/dL or greater -high blood pressure -low levels of folate, iron, or vitamin B12 -seizures -an unusual or allergic reaction to darbepoetin, erythropoietin, albumin, hamster  proteins, latex, other medicines, foods, dyes, or preservatives -pregnant or trying to get pregnant -breast-feeding How should I use this medicine? This medicine is for injection into a vein or under the skin. It is usually given by a health care professional in a hospital or clinic setting. If you get this medicine at home, you will be taught how to prepare and give this medicine. Do not shake the solution before you withdraw a dose. Use exactly as directed. Take your medicine at regular intervals. Do not take your medicine more often than directed. It is important that you put your used needles and syringes in a special sharps container. Do not put them in a trash can. If you do not have a sharps container, call your pharmacist or healthcare provider to get one. Talk to your pediatrician regarding the use of this medicine in children. While this medicine may be used in children as young as 1 year for selected conditions, precautions do apply. Overdosage: If you think you have taken too much of this medicine contact a poison control center or emergency room at once. NOTE: This medicine is only for you. Do not share this medicine with others. What if I miss a dose? If you miss a dose, take it as soon as you can. If it is almost time for your next dose, take only that dose. Do not take double or extra doses. What may interact with this medicine? Do not take this medicine with any of the following medications: -epoetin alfa This list may not describe all possible interactions. Give your health care provider a list of all the medicines,  herbs, non-prescription drugs, or dietary supplements you use. Also tell them if you smoke, drink alcohol, or use illegal drugs. Some items may interact with your medicine. What should I watch for while using this medicine? Visit your prescriber or health care professional for regular checks on your progress and for the needed blood tests and blood pressure  measurements. It is especially important for the doctor to make sure your hemoglobin level is in the desired range, to limit the risk of potential side effects and to give you the best benefit. Keep all appointments for any recommended tests. Check your blood pressure as directed. Ask your doctor what your blood pressure should be and when you should contact him or her. As your body makes more red blood cells, you may need to take iron, folic acid, or vitamin B supplements. Ask your doctor or health care provider which products are right for you. If you have kidney disease continue dietary restrictions, even though this medication can make you feel better. Talk with your doctor or health care professional about the foods you eat and the vitamins that you take. What side effects may I notice from receiving this medicine? Side effects that you should report to your doctor or health care professional as soon as possible: -allergic reactions like skin rash, itching or hives, swelling of the face, lips, or tongue -breathing problems -changes in vision -chest pain -confusion, trouble speaking or understanding -feeling faint or lightheaded, falls -high blood pressure -muscle aches or pains -pain, swelling, warmth in the leg -rapid weight gain -severe headaches -sudden numbness or weakness of the face, arm or leg -trouble walking, dizziness, loss of balance or coordination -seizures (convulsions) -swelling of the ankles, feet, hands -unusually weak or tired Side effects that usually do not require medical attention (report to your doctor or health care professional if they continue or are bothersome): -diarrhea -fever, chills (flu-like symptoms) -headaches -nausea, vomiting -redness, stinging, or swelling at site where injected This list may not describe all possible side effects. Call your doctor for medical advice about side effects. You may report side effects to FDA at 1-800-FDA-1088. Where  should I keep my medicine? Keep out of the reach of children. Store in a refrigerator between 2 and 8 degrees C (36 and 46 degrees F). Do not freeze. Do not shake. Throw away any unused portion if using a single-dose vial. Throw away any unused medicine after the expiration date. NOTE: This sheet is a summary. It may not cover all possible information. If you have questions about this medicine, talk to your doctor, pharmacist, or health care provider.    2016, Elsevier/Gold Standard. (2008-08-28 10:23:57) Tbo-Filgrastim injection What is this medicine? TBO-FILGRASTIM (T B O fil GRA stim) is a granulocyte colony-stimulating factor that stimulates the growth of neutrophils, a type of white blood cell important in the body's fight against infection. It is used to reduce the incidence of fever and infection in patients with certain types of cancer who are receiving chemotherapy that affects the bone marrow. This medicine may be used for other purposes; ask your health care provider or pharmacist if you have questions. What should I tell my health care provider before I take this medicine? They need to know if you have any of these conditions: -ongoing radiation therapy -sickle cell anemia -an unusual or allergic reaction to tbo-filgrastim, filgrastim, pegfilgrastim, other medicines, foods, dyes, or preservatives -pregnant or trying to get pregnant -breast-feeding How should I use this medicine? This medicine is  for injection under the skin. If you get this medicine at home, you will be taught how to prepare and give this medicine. Refer to the Instructions for Use that come with your medication packaging. Use exactly as directed. Take your medicine at regular intervals. Do not take your medicine more often than directed. It is important that you put your used needles and syringes in a special sharps container. Do not put them in a trash can. If you do not have a sharps container, call your pharmacist  or healthcare provider to get one. Talk to your pediatrician regarding the use of this medicine in children. Special care may be needed. Overdosage: If you think you have taken too much of this medicine contact a poison control center or emergency room at once. NOTE: This medicine is only for you. Do not share this medicine with others. What if I miss a dose? It is important not to miss your dose. Call your doctor or health care professional if you miss a dose. What may interact with this medicine? This medicine may interact with the following medications: -medicines that may cause a release of neutrophils, such as lithium This list may not describe all possible interactions. Give your health care provider a list of all the medicines, herbs, non-prescription drugs, or dietary supplements you use. Also tell them if you smoke, drink alcohol, or use illegal drugs. Some items may interact with your medicine. What should I watch for while using this medicine? You may need blood work done while you are taking this medicine. What side effects may I notice from receiving this medicine? Side effects that you should report to your doctor or health care professional as soon as possible: -allergic reactions like skin rash, itching or hives, swelling of the face, lips, or tongue -shortness of breath or breathing problems -fever -pain, redness, or irritation at site where injected -pinpoint red spots on the skin -stomach or side pain, or pain at the shoulder -swelling -tiredness -trouble passing urine Side effects that usually do not require medical attention (Report these to your doctor or health care professional if they continue or are bothersome.): -bone pain -muscle pain This list may not describe all possible side effects. Call your doctor for medical advice about side effects. You may report side effects to FDA at 1-800-FDA-1088. Where should I keep my medicine? Keep out of the reach of  children. Store in a refrigerator between 2 and 8 degrees C (36 and 46 degrees F). Keep in carton to protect from light. Throw away this medicine if it is left out of the refrigerator for more than 5 consecutive days. Throw away any unused medicine after the expiration date. NOTE: This sheet is a summary. It may not cover all possible information. If you have questions about this medicine, talk to your doctor, pharmacist, or health care provider.    2016, Elsevier/Gold Standard. (2014-01-04 11:52:29)

## 2015-11-04 NOTE — Telephone Encounter (Signed)
Pt's wife came back through and wanted the Neulasta injection added to 02/11 states it wasn't put on the schedule. Added 2 injections day 6 per 02/06 POF.... Cherylann Banas

## 2015-11-04 NOTE — Telephone Encounter (Signed)
Per staff message and POF I have scheduled appts. Advised scheduler of appts. JMW  

## 2015-11-04 NOTE — Telephone Encounter (Signed)
per pof to sch pt appt-sent MW email to sch pt trmt-pt to get updated copy b4 leaving °

## 2015-11-04 NOTE — Progress Notes (Signed)
Aranesp injection given by infusion nurse after IV chemotherapy.

## 2015-11-05 ENCOUNTER — Ambulatory Visit (HOSPITAL_BASED_OUTPATIENT_CLINIC_OR_DEPARTMENT_OTHER): Payer: Medicare Other

## 2015-11-05 VITALS — BP 103/50 | HR 74 | Temp 98.2°F

## 2015-11-05 DIAGNOSIS — D469 Myelodysplastic syndrome, unspecified: Secondary | ICD-10-CM

## 2015-11-05 DIAGNOSIS — Z5111 Encounter for antineoplastic chemotherapy: Secondary | ICD-10-CM | POA: Diagnosis present

## 2015-11-05 MED ORDER — LACTATED RINGERS IV SOLN
75.0000 mg/m2 | Freq: Every day | INTRAVENOUS | Status: DC
  Administered 2015-11-05: 150 mg via INTRAVENOUS
  Filled 2015-11-05: qty 30

## 2015-11-05 MED ORDER — ONDANSETRON HCL 40 MG/20ML IJ SOLN
Freq: Once | INTRAMUSCULAR | Status: AC
  Administered 2015-11-05: 13:00:00 via INTRAVENOUS
  Filled 2015-11-05: qty 4

## 2015-11-05 MED ORDER — SODIUM CHLORIDE 0.9 % IV SOLN
Freq: Once | INTRAVENOUS | Status: AC
  Administered 2015-11-05: 13:00:00 via INTRAVENOUS

## 2015-11-05 NOTE — Progress Notes (Signed)
Patient complains of severe constipation. Dr. Julien Nordmann not available today. Patient would like to try something different for nausea. Compazine would be an alternative per pharmacy.

## 2015-11-05 NOTE — Patient Instructions (Signed)
[facility] Discharge Instructions for Patients Receiving Chemotherapy  Today you received the following chemotherapy agents Vidaza  To help prevent nausea and vomiting after your treatment, we encourage you to take your nausea medication as prescribed.    If you develop nausea and vomiting that is not controlled by your nausea medication, call the clinic.   BELOW ARE SYMPTOMS THAT SHOULD BE REPORTED IMMEDIATELY:  *FEVER GREATER THAN 100.5 F  *CHILLS WITH OR WITHOUT FEVER  NAUSEA AND VOMITING THAT IS NOT CONTROLLED WITH YOUR NAUSEA MEDICATION  *UNUSUAL SHORTNESS OF BREATH  *UNUSUAL BRUISING OR BLEEDING  TENDERNESS IN MOUTH AND THROAT WITH OR WITHOUT PRESENCE OF ULCERS  *URINARY PROBLEMS  *BOWEL PROBLEMS  UNUSUAL RASH Items with * indicate a potential emergency and should be followed up as soon as possible.  Feel free to call the clinic you have any questions or concerns. The clinic phone number is [phone].  Please show the CHEMO ALERT CARD at check-in to the Emergency Department and triage nurse.   

## 2015-11-06 ENCOUNTER — Ambulatory Visit (HOSPITAL_BASED_OUTPATIENT_CLINIC_OR_DEPARTMENT_OTHER): Payer: Medicare Other

## 2015-11-06 VITALS — BP 110/57 | HR 57 | Temp 98.1°F | Resp 16

## 2015-11-06 DIAGNOSIS — Z5111 Encounter for antineoplastic chemotherapy: Secondary | ICD-10-CM | POA: Diagnosis present

## 2015-11-06 DIAGNOSIS — D469 Myelodysplastic syndrome, unspecified: Secondary | ICD-10-CM | POA: Diagnosis not present

## 2015-11-06 MED ORDER — AZACITIDINE CHEMO INJECTION 100 MG
75.0000 mg/m2 | Freq: Every day | INTRAMUSCULAR | Status: DC
  Administered 2015-11-06: 150 mg via INTRAVENOUS
  Filled 2015-11-06: qty 30

## 2015-11-06 MED ORDER — PROCHLORPERAZINE MALEATE 10 MG PO TABS
10.0000 mg | ORAL_TABLET | Freq: Four times a day (QID) | ORAL | Status: DC | PRN
Start: 2015-11-06 — End: 2015-11-06
  Administered 2015-11-06: 10 mg via ORAL

## 2015-11-06 MED ORDER — SODIUM CHLORIDE 0.9 % IV SOLN
Freq: Once | INTRAVENOUS | Status: AC
  Administered 2015-11-06: 13:00:00 via INTRAVENOUS

## 2015-11-06 MED ORDER — METHYLPREDNISOLONE SODIUM SUCC 125 MG IJ SOLR
INTRAMUSCULAR | Status: AC
  Filled 2015-11-06: qty 2

## 2015-11-06 MED ORDER — METHYLPREDNISOLONE SODIUM SUCC 125 MG IJ SOLR
60.0000 mg | Freq: Once | INTRAMUSCULAR | Status: AC
  Administered 2015-11-06: 60 mg via INTRAVENOUS

## 2015-11-06 MED ORDER — PROCHLORPERAZINE MALEATE 10 MG PO TABS
ORAL_TABLET | ORAL | Status: AC
  Filled 2015-11-06: qty 1

## 2015-11-06 NOTE — Progress Notes (Signed)
Upon arrival in infusion area pt states he developed a rash across his lower trunk, groin area and upper legs. Red raised areas that bwere very itchy. Also noticed some areas on his arms and neck.  Also requesting compazine  For pre-med instead of zofran as the zofran causes him significant constipation.  Spoke with Dr. Julien Nordmann and received order dor IV Solumedrol 60 mg and to give compazine 10 mg po instead of the zofran. Pt made aware.

## 2015-11-06 NOTE — Patient Instructions (Signed)
[facility] Discharge Instructions for Patients Receiving Chemotherapy  Today you received the following chemotherapy agents Vidaza  To help prevent nausea and vomiting after your treatment, we encourage you to take your nausea medication Compazine 10 mg every 6 hours as needed  If you develop nausea and vomiting that is not controlled by your nausea medication, call the clinic.   BELOW ARE SYMPTOMS THAT SHOULD BE REPORTED IMMEDIATELY:  *FEVER GREATER THAN 100.5 F  *CHILLS WITH OR WITHOUT FEVER  NAUSEA AND VOMITING THAT IS NOT CONTROLLED WITH YOUR NAUSEA MEDICATION  *UNUSUAL SHORTNESS OF BREATH  *UNUSUAL BRUISING OR BLEEDING  TENDERNESS IN MOUTH AND THROAT WITH OR WITHOUT PRESENCE OF ULCERS  *URINARY PROBLEMS  *BOWEL PROBLEMS  UNUSUAL RASH Items with * indicate a potential emergency and should be followed up as soon as possible.  Feel free to call the clinic you have any questions or concerns. The clinic phone number is [phone].  Please show the CHEMO ALERT CARD at check-in to the Emergency Department and triage nurse.   

## 2015-11-07 ENCOUNTER — Ambulatory Visit (HOSPITAL_BASED_OUTPATIENT_CLINIC_OR_DEPARTMENT_OTHER): Payer: Medicare Other

## 2015-11-07 VITALS — BP 114/58 | HR 81 | Temp 98.3°F | Resp 18

## 2015-11-07 DIAGNOSIS — Z5111 Encounter for antineoplastic chemotherapy: Secondary | ICD-10-CM | POA: Diagnosis present

## 2015-11-07 DIAGNOSIS — D469 Myelodysplastic syndrome, unspecified: Secondary | ICD-10-CM

## 2015-11-07 MED ORDER — PROCHLORPERAZINE MALEATE 10 MG PO TABS
ORAL_TABLET | ORAL | Status: AC
  Filled 2015-11-07: qty 1

## 2015-11-07 MED ORDER — SODIUM CHLORIDE 0.9 % IJ SOLN
10.0000 mL | INTRAMUSCULAR | Status: DC | PRN
  Filled 2015-11-07: qty 10

## 2015-11-07 MED ORDER — SODIUM CHLORIDE 0.9 % IV SOLN: Freq: Once | INTRAVENOUS | Status: DC

## 2015-11-07 MED ORDER — ALTEPLASE 2 MG IJ SOLR
2.0000 mg | Freq: Once | INTRAMUSCULAR | Status: DC | PRN
  Filled 2015-11-07: qty 2

## 2015-11-07 MED ORDER — HEPARIN SOD (PORK) LOCK FLUSH 100 UNIT/ML IV SOLN
250.0000 [IU] | Freq: Once | INTRAVENOUS | Status: DC | PRN
  Filled 2015-11-07: qty 5

## 2015-11-07 MED ORDER — PROCHLORPERAZINE MALEATE 10 MG PO TABS
10.0000 mg | ORAL_TABLET | Freq: Once | ORAL | Status: AC
  Administered 2015-11-07: 10 mg via ORAL

## 2015-11-07 MED ORDER — SODIUM CHLORIDE 0.9 % IJ SOLN
3.0000 mL | INTRAMUSCULAR | Status: DC | PRN
  Filled 2015-11-07: qty 10

## 2015-11-07 MED ORDER — LACTATED RINGERS IV SOLN
75.0000 mg/m2 | Freq: Every day | INTRAVENOUS | Status: DC
  Administered 2015-11-07: 150 mg via INTRAVENOUS
  Filled 2015-11-07: qty 30

## 2015-11-07 MED ORDER — PROCHLORPERAZINE EDISYLATE 5 MG/ML IJ SOLN: 10.0000 mg | Freq: Once | INTRAMUSCULAR | Status: DC

## 2015-11-07 MED ORDER — SODIUM CHLORIDE 0.9 % IV SOLN
Freq: Once | INTRAVENOUS | Status: AC
  Administered 2015-11-07: 12:00:00 via INTRAVENOUS

## 2015-11-07 MED ORDER — HEPARIN SOD (PORK) LOCK FLUSH 100 UNIT/ML IV SOLN
500.0000 [IU] | Freq: Once | INTRAVENOUS | Status: DC | PRN
  Filled 2015-11-07: qty 5

## 2015-11-07 MED ORDER — PROCHLORPERAZINE MALEATE 10 MG PO TABS: 10.0000 mg | ORAL_TABLET | Freq: Four times a day (QID) | ORAL | Status: DC | PRN

## 2015-11-07 MED ORDER — LACTATED RINGERS IV SOLN: 75.0000 mg/m2 | Freq: Every day | INTRAVENOUS | Status: DC

## 2015-11-07 NOTE — Patient Instructions (Signed)
[facility] Discharge Instructions for Patients Receiving Chemotherapy  Today you received the following chemotherapy agents Vidaza  To help prevent nausea and vomiting after your treatment, we encourage you to take your nausea medication as prescribed.    If you develop nausea and vomiting that is not controlled by your nausea medication, call the clinic.   BELOW ARE SYMPTOMS THAT SHOULD BE REPORTED IMMEDIATELY:  *FEVER GREATER THAN 100.5 F  *CHILLS WITH OR WITHOUT FEVER  NAUSEA AND VOMITING THAT IS NOT CONTROLLED WITH YOUR NAUSEA MEDICATION  *UNUSUAL SHORTNESS OF BREATH  *UNUSUAL BRUISING OR BLEEDING  TENDERNESS IN MOUTH AND THROAT WITH OR WITHOUT PRESENCE OF ULCERS  *URINARY PROBLEMS  *BOWEL PROBLEMS  UNUSUAL RASH Items with * indicate a potential emergency and should be followed up as soon as possible.  Feel free to call the clinic you have any questions or concerns. The clinic phone number is [phone].  Please show the CHEMO ALERT CARD at check-in to the Emergency Department and triage nurse.   

## 2015-11-08 ENCOUNTER — Ambulatory Visit (HOSPITAL_BASED_OUTPATIENT_CLINIC_OR_DEPARTMENT_OTHER): Payer: Medicare Other

## 2015-11-08 ENCOUNTER — Other Ambulatory Visit: Payer: Self-pay | Admitting: *Deleted

## 2015-11-08 DIAGNOSIS — Z5111 Encounter for antineoplastic chemotherapy: Secondary | ICD-10-CM

## 2015-11-08 DIAGNOSIS — D469 Myelodysplastic syndrome, unspecified: Secondary | ICD-10-CM

## 2015-11-08 MED ORDER — PROCHLORPERAZINE MALEATE 10 MG PO TABS
10.0000 mg | ORAL_TABLET | Freq: Once | ORAL | Status: AC
  Administered 2015-11-08: 10 mg via ORAL

## 2015-11-08 MED ORDER — PROCHLORPERAZINE MALEATE 10 MG PO TABS
ORAL_TABLET | ORAL | Status: AC
  Filled 2015-11-08: qty 1

## 2015-11-08 MED ORDER — SODIUM CHLORIDE 0.9 % IV SOLN
Freq: Once | INTRAVENOUS | Status: AC
  Administered 2015-11-08: 12:00:00 via INTRAVENOUS

## 2015-11-08 MED ORDER — PROCHLORPERAZINE MALEATE 10 MG PO TABS: 10.0000 mg | ORAL_TABLET | Freq: Three times a day (TID) | ORAL | Status: DC | PRN

## 2015-11-08 MED ORDER — LORAZEPAM 0.5 MG PO TABS: 0.5000 mg | ORAL_TABLET | Freq: Three times a day (TID) | ORAL | Status: DC

## 2015-11-08 MED ORDER — LORAZEPAM 1 MG PO TABS
0.5000 mg | ORAL_TABLET | Freq: Once | ORAL | Status: AC
  Administered 2015-11-08: 0.5 mg via ORAL

## 2015-11-08 MED ORDER — LACTATED RINGERS IV SOLN
75.0000 mg/m2 | Freq: Every day | INTRAVENOUS | Status: DC
  Administered 2015-11-08: 150 mg via INTRAVENOUS
  Filled 2015-11-08: qty 30

## 2015-11-08 MED ORDER — LORAZEPAM 1 MG PO TABS
ORAL_TABLET | ORAL | Status: AC
  Filled 2015-11-08: qty 1

## 2015-11-08 NOTE — Patient Instructions (Signed)
[facility] Discharge Instructions for Patients Receiving Chemotherapy  Today you received the following chemotherapy agents: Vidaza   To help prevent nausea and vomiting after your treatment, we encourage you to take your nausea medication as directed.    If you develop nausea and vomiting that is not controlled by your nausea medication, call the clinic.   BELOW ARE SYMPTOMS THAT SHOULD BE REPORTED IMMEDIATELY:  *FEVER GREATER THAN 100.5 F  *CHILLS WITH OR WITHOUT FEVER  NAUSEA AND VOMITING THAT IS NOT CONTROLLED WITH YOUR NAUSEA MEDICATION  *UNUSUAL SHORTNESS OF BREATH  *UNUSUAL BRUISING OR BLEEDING  TENDERNESS IN MOUTH AND THROAT WITH OR WITHOUT PRESENCE OF ULCERS  *URINARY PROBLEMS  *BOWEL PROBLEMS  UNUSUAL RASH Items with * indicate a potential emergency and should be followed up as soon as possible.  Feel free to call the clinic you have any questions or concerns. The clinic phone number is [phone].  Please show the CHEMO ALERT CARD at check-in to the Emergency Department and triage nurse.   

## 2015-11-09 ENCOUNTER — Ambulatory Visit: Payer: Medicare Other

## 2015-11-11 ENCOUNTER — Other Ambulatory Visit (HOSPITAL_BASED_OUTPATIENT_CLINIC_OR_DEPARTMENT_OTHER): Payer: Medicare Other

## 2015-11-11 ENCOUNTER — Ambulatory Visit (HOSPITAL_BASED_OUTPATIENT_CLINIC_OR_DEPARTMENT_OTHER): Payer: Medicare Other

## 2015-11-11 VITALS — BP 125/63 | HR 78 | Temp 97.6°F

## 2015-11-11 DIAGNOSIS — Z5189 Encounter for other specified aftercare: Secondary | ICD-10-CM

## 2015-11-11 DIAGNOSIS — D469 Myelodysplastic syndrome, unspecified: Secondary | ICD-10-CM | POA: Diagnosis present

## 2015-11-11 DIAGNOSIS — D4620 Refractory anemia with excess of blasts, unspecified: Secondary | ICD-10-CM

## 2015-11-11 DIAGNOSIS — D539 Nutritional anemia, unspecified: Secondary | ICD-10-CM

## 2015-11-11 LAB — CBC WITH DIFFERENTIAL/PLATELET
BASO%: 3.3 % — AB (ref 0.0–2.0)
Basophils Absolute: 0.1 10*3/uL (ref 0.0–0.1)
EOS ABS: 0 10*3/uL (ref 0.0–0.5)
EOS%: 0.8 % (ref 0.0–7.0)
HCT: 30 % — ABNORMAL LOW (ref 38.4–49.9)
HGB: 9.9 g/dL — ABNORMAL LOW (ref 13.0–17.1)
LYMPH%: 74.7 % — AB (ref 14.0–49.0)
MCH: 36.5 pg — ABNORMAL HIGH (ref 27.2–33.4)
MCHC: 33.1 g/dL (ref 32.0–36.0)
MCV: 110.3 fL — AB (ref 79.3–98.0)
MONO#: 0.2 10*3/uL (ref 0.1–0.9)
MONO%: 9 % (ref 0.0–14.0)
NEUT%: 12.2 % — ABNORMAL LOW (ref 39.0–75.0)
NEUTROS ABS: 0.2 10*3/uL — AB (ref 1.5–6.5)
Platelets: 93 10*3/uL — ABNORMAL LOW (ref 140–400)
RBC: 2.72 10*6/uL — AB (ref 4.20–5.82)
RDW: 23.9 % — ABNORMAL HIGH (ref 11.0–14.6)
WBC: 1.9 10*3/uL — AB (ref 4.0–10.3)
lymph#: 1.4 10*3/uL (ref 0.9–3.3)

## 2015-11-11 LAB — COMPREHENSIVE METABOLIC PANEL
ALT: 15 U/L (ref 0–55)
AST: 18 U/L (ref 5–34)
Albumin: 4.4 g/dL (ref 3.5–5.0)
Alkaline Phosphatase: 47 U/L (ref 40–150)
Anion Gap: 8 mEq/L (ref 3–11)
BILIRUBIN TOTAL: 1.27 mg/dL — AB (ref 0.20–1.20)
BUN: 13.2 mg/dL (ref 7.0–26.0)
CHLORIDE: 103 meq/L (ref 98–109)
CO2: 26 meq/L (ref 22–29)
Calcium: 9.2 mg/dL (ref 8.4–10.4)
Creatinine: 0.9 mg/dL (ref 0.7–1.3)
EGFR: 79 mL/min/{1.73_m2} — AB (ref >90)
GLUCOSE: 93 mg/dL (ref 70–140)
Potassium: 4.2 mEq/L (ref 3.5–5.1)
SODIUM: 137 meq/L (ref 136–145)
TOTAL PROTEIN: 7.5 g/dL (ref 6.4–8.3)

## 2015-11-11 MED ORDER — PEGFILGRASTIM INJECTION 6 MG/0.6ML ~~LOC~~
6.0000 mg | PREFILLED_SYRINGE | Freq: Once | SUBCUTANEOUS | Status: AC
  Administered 2015-11-11: 6 mg via SUBCUTANEOUS
  Filled 2015-11-11: qty 0.6

## 2015-11-11 NOTE — Patient Instructions (Signed)
Pegfilgrastim injection What is this medicine? PEGFILGRASTIM (PEG fil gra stim) is a long-acting granulocyte colony-stimulating factor that stimulates the growth of neutrophils, a type of white blood cell important in the body's fight against infection. It is used to reduce the incidence of fever and infection in patients with certain types of cancer who are receiving chemotherapy that affects the bone marrow, and to increase survival after being exposed to high doses of radiation. This medicine may be used for other purposes; ask your health care provider or pharmacist if you have questions. What should I tell my health care provider before I take this medicine? They need to know if you have any of these conditions: -kidney disease -latex allergy -ongoing radiation therapy -sickle cell disease -skin reactions to acrylic adhesives (On-Body Injector only) -an unusual or allergic reaction to pegfilgrastim, filgrastim, other medicines, foods, dyes, or preservatives -pregnant or trying to get pregnant -breast-feeding How should I use this medicine? This medicine is for injection under the skin. If you get this medicine at home, you will be taught how to prepare and give the pre-filled syringe or how to use the On-body Injector. Refer to the patient Instructions for Use for detailed instructions. Use exactly as directed. Take your medicine at regular intervals. Do not take your medicine more often than directed. It is important that you put your used needles and syringes in a special sharps container. Do not put them in a trash can. If you do not have a sharps container, call your pharmacist or healthcare provider to get one. Talk to your pediatrician regarding the use of this medicine in children. While this drug may be prescribed for selected conditions, precautions do apply. Overdosage: If you think you have taken too much of this medicine contact a poison control center or emergency room at  once. NOTE: This medicine is only for you. Do not share this medicine with others. What if I miss a dose? It is important not to miss your dose. Call your doctor or health care professional if you miss your dose. If you miss a dose due to an On-body Injector failure or leakage, a new dose should be administered as soon as possible using a single prefilled syringe for manual use. What may interact with this medicine? Interactions have not been studied. Give your health care provider a list of all the medicines, herbs, non-prescription drugs, or dietary supplements you use. Also tell them if you smoke, drink alcohol, or use illegal drugs. Some items may interact with your medicine. This list may not describe all possible interactions. Give your health care provider a list of all the medicines, herbs, non-prescription drugs, or dietary supplements you use. Also tell them if you smoke, drink alcohol, or use illegal drugs. Some items may interact with your medicine. What should I watch for while using this medicine? You may need blood work done while you are taking this medicine. If you are going to need a MRI, CT scan, or other procedure, tell your doctor that you are using this medicine (On-Body Injector only). What side effects may I notice from receiving this medicine? Side effects that you should report to your doctor or health care professional as soon as possible: -allergic reactions like skin rash, itching or hives, swelling of the face, lips, or tongue -dizziness -fever -pain, redness, or irritation at site where injected -pinpoint red spots on the skin -red or dark-brown urine -shortness of breath or breathing problems -stomach or side pain, or pain   at the shoulder -swelling -tiredness -trouble passing urine or change in the amount of urine Side effects that usually do not require medical attention (report to your doctor or health care professional if they continue or are  bothersome): -bone pain -muscle pain This list may not describe all possible side effects. Call your doctor for medical advice about side effects. You may report side effects to FDA at 1-800-FDA-1088. Where should I keep my medicine? Keep out of the reach of children. Store pre-filled syringes in a refrigerator between 2 and 8 degrees C (36 and 46 degrees F). Do not freeze. Keep in carton to protect from light. Throw away this medicine if it is left out of the refrigerator for more than 48 hours. Throw away any unused medicine after the expiration date. NOTE: This sheet is a summary. It may not cover all possible information. If you have questions about this medicine, talk to your doctor, pharmacist, or health care provider.    2016, Elsevier/[employer] Standard. (2014-10-04 14:30:14)  

## 2015-11-18 ENCOUNTER — Other Ambulatory Visit (HOSPITAL_BASED_OUTPATIENT_CLINIC_OR_DEPARTMENT_OTHER): Payer: Medicare Other

## 2015-11-18 DIAGNOSIS — D4620 Refractory anemia with excess of blasts, unspecified: Secondary | ICD-10-CM

## 2015-11-18 LAB — COMPREHENSIVE METABOLIC PANEL
ALBUMIN: 4 g/dL (ref 3.5–5.0)
ALT: 20 U/L (ref 0–55)
AST: 61 U/L — AB (ref 5–34)
Alkaline Phosphatase: 57 U/L (ref 40–150)
Anion Gap: 7 mEq/L (ref 3–11)
BUN: 15.9 mg/dL (ref 7.0–26.0)
CHLORIDE: 104 meq/L (ref 98–109)
CO2: 25 mEq/L (ref 22–29)
Calcium: 8.9 mg/dL (ref 8.4–10.4)
Creatinine: 0.8 mg/dL (ref 0.7–1.3)
EGFR: 82 mL/min/{1.73_m2} — ABNORMAL LOW (ref >90)
GLUCOSE: 92 mg/dL (ref 70–140)
POTASSIUM: 4.3 meq/L (ref 3.5–5.1)
SODIUM: 137 meq/L (ref 136–145)
Total Bilirubin: 0.74 mg/dL (ref 0.20–1.20)
Total Protein: 6.8 g/dL (ref 6.4–8.3)

## 2015-11-18 LAB — CBC WITH DIFFERENTIAL/PLATELET
BASO%: 0.9 % (ref 0.0–2.0)
Basophils Absolute: 0.1 10*3/uL (ref 0.0–0.1)
EOS%: 0.2 % (ref 0.0–7.0)
Eosinophils Absolute: 0 10*3/uL (ref 0.0–0.5)
HCT: 26 % — ABNORMAL LOW (ref 38.4–49.9)
HEMOGLOBIN: 8.7 g/dL — AB (ref 13.0–17.1)
LYMPH#: 2.3 10*3/uL (ref 0.9–3.3)
LYMPH%: 17.4 % (ref 14.0–49.0)
MCH: 36.4 pg — AB (ref 27.2–33.4)
MCHC: 33.5 g/dL (ref 32.0–36.0)
MCV: 108.8 fL — ABNORMAL HIGH (ref 79.3–98.0)
MONO#: 1.2 10*3/uL — ABNORMAL HIGH (ref 0.1–0.9)
MONO%: 9.3 % (ref 0.0–14.0)
NEUT%: 72.2 % (ref 39.0–75.0)
NEUTROS ABS: 9.6 10*3/uL — AB (ref 1.5–6.5)
Platelets: 34 10*3/uL — ABNORMAL LOW (ref 140–400)
RBC: 2.39 10*6/uL — ABNORMAL LOW (ref 4.20–5.82)
RDW: 22.8 % — ABNORMAL HIGH (ref 11.0–14.6)
WBC: 13.3 10*3/uL — AB (ref 4.0–10.3)
nRBC: 1 % — ABNORMAL HIGH (ref 0–0)

## 2015-11-18 LAB — TECHNOLOGIST REVIEW

## 2015-11-25 ENCOUNTER — Ambulatory Visit (HOSPITAL_BASED_OUTPATIENT_CLINIC_OR_DEPARTMENT_OTHER): Payer: Medicare Other

## 2015-11-25 ENCOUNTER — Other Ambulatory Visit (HOSPITAL_BASED_OUTPATIENT_CLINIC_OR_DEPARTMENT_OTHER): Payer: Medicare Other

## 2015-11-25 ENCOUNTER — Other Ambulatory Visit: Payer: Self-pay | Admitting: Internal Medicine

## 2015-11-25 VITALS — BP 122/50 | HR 89 | Temp 98.1°F

## 2015-11-25 DIAGNOSIS — D4620 Refractory anemia with excess of blasts, unspecified: Secondary | ICD-10-CM | POA: Diagnosis present

## 2015-11-25 DIAGNOSIS — D539 Nutritional anemia, unspecified: Secondary | ICD-10-CM | POA: Diagnosis not present

## 2015-11-25 DIAGNOSIS — D469 Myelodysplastic syndrome, unspecified: Secondary | ICD-10-CM

## 2015-11-25 LAB — COMPREHENSIVE METABOLIC PANEL
ALBUMIN: 4.2 g/dL (ref 3.5–5.0)
ALK PHOS: 51 U/L (ref 40–150)
ALT: 19 U/L (ref 0–55)
AST: 20 U/L (ref 5–34)
Anion Gap: 9 mEq/L (ref 3–11)
BUN: 15.8 mg/dL (ref 7.0–26.0)
CO2: 24 mEq/L (ref 22–29)
CREATININE: 0.9 mg/dL (ref 0.7–1.3)
Calcium: 8.9 mg/dL (ref 8.4–10.4)
Chloride: 106 mEq/L (ref 98–109)
EGFR: 80 mL/min/{1.73_m2} — ABNORMAL LOW (ref >90)
GLUCOSE: 96 mg/dL (ref 70–140)
Potassium: 4.6 mEq/L (ref 3.5–5.1)
SODIUM: 139 meq/L (ref 136–145)
TOTAL PROTEIN: 7.2 g/dL (ref 6.4–8.3)
Total Bilirubin: 0.78 mg/dL (ref 0.20–1.20)

## 2015-11-25 LAB — CBC WITH DIFFERENTIAL/PLATELET
BASO%: 1.4 % (ref 0.0–2.0)
Basophils Absolute: 0 10*3/uL (ref 0.0–0.1)
EOS%: 0.1 % (ref 0.0–7.0)
Eosinophils Absolute: 0 10*3/uL (ref 0.0–0.5)
HEMATOCRIT: 25.8 % — AB (ref 38.4–49.9)
HEMOGLOBIN: 8.6 g/dL — AB (ref 13.0–17.1)
LYMPH#: 1.6 10*3/uL (ref 0.9–3.3)
LYMPH%: 48.2 % (ref 14.0–49.0)
MCH: 36.9 pg — ABNORMAL HIGH (ref 27.2–33.4)
MCHC: 33.3 g/dL (ref 32.0–36.0)
MCV: 110.9 fL — ABNORMAL HIGH (ref 79.3–98.0)
MONO#: 0.4 10*3/uL (ref 0.1–0.9)
MONO%: 11.6 % (ref 0.0–14.0)
NEUT%: 38.7 % — ABNORMAL LOW (ref 39.0–75.0)
NEUTROS ABS: 1.3 10*3/uL — AB (ref 1.5–6.5)
Platelets: 30 10*3/uL — ABNORMAL LOW (ref 140–400)
RBC: 2.33 10*6/uL — ABNORMAL LOW (ref 4.20–5.82)
RDW: 24 % — AB (ref 11.0–14.6)
WBC: 3.4 10*3/uL — AB (ref 4.0–10.3)

## 2015-11-25 LAB — TECHNOLOGIST REVIEW

## 2015-11-25 MED ORDER — DARBEPOETIN ALFA 300 MCG/0.6ML IJ SOSY
300.0000 ug | PREFILLED_SYRINGE | Freq: Once | INTRAMUSCULAR | Status: AC
  Administered 2015-11-25: 300 ug via SUBCUTANEOUS
  Filled 2015-11-25: qty 0.6

## 2015-11-27 ENCOUNTER — Encounter: Payer: Self-pay | Admitting: Medical Oncology

## 2015-11-28 ENCOUNTER — Telehealth: Payer: Self-pay | Admitting: *Deleted

## 2015-11-28 NOTE — Telephone Encounter (Signed)
Returned call to Mrs Ferdinand Lango. LMOVM, Pt's husband received Aranesp 2/27 after chemotherapy hgb 8.6. Pt will continue to receive aranesp q 3wks per MD progress note. Pt received Granix 1/16 and 1/17 due to [place] 0.3. Pt to f/u Lab,MD, Chemo 3/6. Request pt or Mrs Ferdinand Lango to call back with additional concerns.

## 2015-12-02 ENCOUNTER — Ambulatory Visit (HOSPITAL_BASED_OUTPATIENT_CLINIC_OR_DEPARTMENT_OTHER): Payer: Medicare Other

## 2015-12-02 ENCOUNTER — Encounter: Payer: Self-pay | Admitting: Internal Medicine

## 2015-12-02 ENCOUNTER — Other Ambulatory Visit (HOSPITAL_BASED_OUTPATIENT_CLINIC_OR_DEPARTMENT_OTHER): Payer: Medicare Other

## 2015-12-02 ENCOUNTER — Ambulatory Visit (HOSPITAL_BASED_OUTPATIENT_CLINIC_OR_DEPARTMENT_OTHER): Payer: Medicare Other | Admitting: Internal Medicine

## 2015-12-02 ENCOUNTER — Telehealth: Payer: Self-pay | Admitting: Internal Medicine

## 2015-12-02 VITALS — BP 112/59 | HR 81 | Temp 98.2°F | Resp 18 | Ht 72.0 in | Wt 177.4 lb

## 2015-12-02 DIAGNOSIS — Z5111 Encounter for antineoplastic chemotherapy: Secondary | ICD-10-CM

## 2015-12-02 DIAGNOSIS — D72819 Decreased white blood cell count, unspecified: Secondary | ICD-10-CM

## 2015-12-02 DIAGNOSIS — D4620 Refractory anemia with excess of blasts, unspecified: Secondary | ICD-10-CM

## 2015-12-02 DIAGNOSIS — D701 Agranulocytosis secondary to cancer chemotherapy: Secondary | ICD-10-CM

## 2015-12-02 DIAGNOSIS — D469 Myelodysplastic syndrome, unspecified: Secondary | ICD-10-CM

## 2015-12-02 DIAGNOSIS — D709 Neutropenia, unspecified: Secondary | ICD-10-CM

## 2015-12-02 DIAGNOSIS — D539 Nutritional anemia, unspecified: Secondary | ICD-10-CM

## 2015-12-02 LAB — CBC WITH DIFFERENTIAL/PLATELET
BASO%: 2.3 % — ABNORMAL HIGH (ref 0.0–2.0)
Basophils Absolute: 0.1 10*3/uL (ref 0.0–0.1)
EOS ABS: 0 10*3/uL (ref 0.0–0.5)
EOS%: 0 % (ref 0.0–7.0)
HCT: 24.3 % — ABNORMAL LOW (ref 38.4–49.9)
HGB: 8.1 g/dL — ABNORMAL LOW (ref 13.0–17.1)
LYMPH%: 49 % (ref 14.0–49.0)
MCH: 35.8 pg — ABNORMAL HIGH (ref 27.2–33.4)
MCHC: 33.3 g/dL (ref 32.0–36.0)
MCV: 107.5 fL — ABNORMAL HIGH (ref 79.3–98.0)
MONO#: 0.6 10*3/uL (ref 0.1–0.9)
MONO%: 22 % — AB (ref 0.0–14.0)
NEUT%: 26.7 % — ABNORMAL LOW (ref 39.0–75.0)
NEUTROS ABS: 0.7 10*3/uL — AB (ref 1.5–6.5)
PLATELETS: 74 10*3/uL — AB (ref 140–400)
RBC: 2.26 10*6/uL — AB (ref 4.20–5.82)
RDW: 21.8 % — ABNORMAL HIGH (ref 11.0–14.6)
WBC: 2.6 10*3/uL — AB (ref 4.0–10.3)
lymph#: 1.3 10*3/uL (ref 0.9–3.3)
nRBC: 4 % — ABNORMAL HIGH (ref 0–0)

## 2015-12-02 LAB — COMPREHENSIVE METABOLIC PANEL
ALT: 10 U/L (ref 0–55)
ANION GAP: 8 meq/L (ref 3–11)
AST: 16 U/L (ref 5–34)
Albumin: 4.1 g/dL (ref 3.5–5.0)
Alkaline Phosphatase: 53 U/L (ref 40–150)
BUN: 12.2 mg/dL (ref 7.0–26.0)
CHLORIDE: 105 meq/L (ref 98–109)
CO2: 24 meq/L (ref 22–29)
CREATININE: 0.8 mg/dL (ref 0.7–1.3)
Calcium: 9 mg/dL (ref 8.4–10.4)
EGFR: 82 mL/min/{1.73_m2} — AB (ref >90)
Glucose: 81 mg/dl (ref 70–140)
Potassium: 4.4 mEq/L (ref 3.5–5.1)
SODIUM: 137 meq/L (ref 136–145)
Total Bilirubin: 0.92 mg/dL (ref 0.20–1.20)
Total Protein: 7.1 g/dL (ref 6.4–8.3)

## 2015-12-02 MED ORDER — LACTATED RINGERS IV SOLN
75.0000 mg/m2 | Freq: Every day | INTRAVENOUS | Status: DC
  Administered 2015-12-02: 150 mg via INTRAVENOUS
  Filled 2015-12-02: qty 30

## 2015-12-02 MED ORDER — HEPARIN SOD (PORK) LOCK FLUSH 100 UNIT/ML IV SOLN
500.0000 [IU] | Freq: Once | INTRAVENOUS | Status: DC | PRN
  Filled 2015-12-02: qty 5

## 2015-12-02 MED ORDER — SODIUM CHLORIDE 0.9 % IJ SOLN
10.0000 mL | INTRAMUSCULAR | Status: DC | PRN
  Filled 2015-12-02: qty 10

## 2015-12-02 MED ORDER — PROCHLORPERAZINE MALEATE 10 MG PO TABS
ORAL_TABLET | ORAL | Status: AC
  Filled 2015-12-02: qty 1

## 2015-12-02 MED ORDER — SODIUM CHLORIDE 0.9 % IV SOLN
Freq: Once | INTRAVENOUS | Status: AC
  Administered 2015-12-02: 14:00:00 via INTRAVENOUS

## 2015-12-02 MED ORDER — PROCHLORPERAZINE MALEATE 10 MG PO TABS
10.0000 mg | ORAL_TABLET | Freq: Once | ORAL | Status: AC
  Administered 2015-12-02: 10 mg via ORAL

## 2015-12-02 NOTE — Progress Notes (Signed)
[facility] [phone]   Fax:(336) [place], [address] [place] [address]  DIAGNOSIS: 1)  myelodysplastic syndrome consistent with refractory anemia with excess blast diagnosed in December 2016 2)  History of ITP  PRIOR THERAPY: None  CURRENT THERAPY:  1) Vidaza 75 mg/m2 IV for 5 days every 4 weeks. Status post one cycle. 2) Supportive care with Aranesp 300 g subcutaneously every 3 weeks in addition to Granix 300 mcg subcutaneously for neutropenia as needed.  INTERVAL HISTORY: Dean Stanley 82 y.o. male returns to the clinic today for follow-up visit accompanied by his wife. The patient is feeling fine today with no specific complaints except for fatigue and shortness breath with exertion. He tolerated the second cycle of his treatment fairly well with no significant adverse effects. He continues to have pancytopenia but no fever or chills.  He has no bleeding issues. He denied having any significant chest pain, cough or hemoptysis. The patient denied having any weight loss or night sweats. He has no nausea or vomiting. He is here today to start cycle #3 of his systemic therapy.  MEDICAL HISTORY: Past Medical History  Diagnosis Date  . Meniere's syndrome 1980s  . History of ITP 1960  . Anemia   . Complication of anesthesia     Reaction to anesthesia " things went haywire around me"  . Hypothyroidism   . Hearing impairment     wears hearing left ear  . Cancer (HCC)     myelodysplastic syndrome  . Do not resuscitate 11/04/2015    ALLERGIES:  is allergic to hydrocodone and oxycodone.  MEDICATIONS:  Current Outpatient Prescriptions  Medication Sig Dispense Refill  . desmopressin (DDAVP) 0.2 MG tablet Take 0.2 mg by mouth daily.    . diphenhydrAMINE (BENADRYL) 25 MG tablet Take 25 mg by mouth every 6 (six) hours as needed for itching.    . famotidine (PEPCID) 20 MG tablet Take  20 mg by mouth 2 (two) times daily.    . ferrous sulfate 325 (65 FE) MG tablet Take 325 mg by mouth daily with breakfast.    . finasteride (PROPECIA) 1 MG tablet Take 1 mg by mouth daily.    Marland Kitchen levothyroxine (SYNTHROID, LEVOTHROID) 200 MCG tablet Take 200 mcg by mouth daily.    Marland Kitchen lidocaine (XYLOCAINE) 2 % jelly 1 application as needed (for rectal area as needed).    . LORazepam (ATIVAN) 0.5 MG tablet Take 1 tablet (0.5 mg total) by mouth every 8 (eight) hours. 30 tablet 0  . Lutein 40 MG CAPS Take 40 mg by mouth daily.    . Multiple Vitamins-Minerals (CENTRUM ADULTS PO) Take 1 tablet by mouth daily.     Marland Kitchen nystatin-triamcinolone ointment (MYCOLOG) Apply 1 application topically 2 (two) times daily. 30 g 0  . polyethylene glycol (MIRALAX / GLYCOLAX) packet Take 17 g by mouth daily as needed for mild constipation.    . prochlorperazine (COMPAZINE) 10 MG tablet Take 1 tablet (10 mg total) by mouth every 8 (eight) hours as needed for nausea or vomiting. 30 tablet 0  . senna-docusate (SENNA S) 8.6-50 MG tablet Take 2 tablets by mouth every 6 (six) hours as needed for mild constipation.     No current facility-administered medications for this visit.    SURGICAL HISTORY:  Past Surgical History  Procedure Laterality Date  . Colonoscopy    . Cortisone injections to back x  6    . Circumcision    . Cataract Right 2011  . Orif orbital fracture Left 2000    2 fractures orbital platform    REVIEW OF SYSTEMS:  A comprehensive review of systems was negative except for: Constitutional: positive for fatigue Respiratory: positive for dyspnea on exertion   PHYSICAL EXAMINATION: General appearance: alert, cooperative, fatigued and no distress Head: Normocephalic, without obvious abnormality, atraumatic Neck: no adenopathy, no JVD, supple, symmetrical, trachea midline and thyroid not enlarged, symmetric, no tenderness/mass/nodules Lymph nodes: Cervical, supraclavicular, and axillary nodes normal. Resp:  clear to auscultation bilaterally Back: symmetric, no curvature. ROM normal. No CVA tenderness. Cardio: regular rate and rhythm, S1, S2 normal, no murmur, click, rub or gallop GI: soft, non-tender; bowel sounds normal; no masses,  no organomegaly Extremities: extremities normal, atraumatic, no cyanosis or edema Neurologic: Alert and oriented X 3, normal strength and tone. Normal symmetric reflexes. Normal coordination and gait  ECOG PERFORMANCE STATUS: 1 - Symptomatic but completely ambulatory  Blood pressure 112/59, pulse 81, temperature 98.2 F (36.8 C), temperature source Oral, resp. rate 18, height 6' (1.829 m), weight 177 lb 6.4 oz (80.468 kg), SpO2 99 %.  LABORATORY DATA: Lab Results  Component Value Date   WBC 2.6* 12/02/2015   HGB 8.1* 12/02/2015   HCT 24.3* 12/02/2015   MCV 107.5* 12/02/2015   PLT 74* 12/02/2015      Chemistry      Component Value Date/Time   NA 137 12/02/2015 1130   NA 141 08/25/2006 1353   K 4.4 12/02/2015 1130   K 4.4 08/25/2006 1353   CL 104 08/25/2006 1353   CO2 24 12/02/2015 1130   CO2 29 08/25/2006 1353   BUN 12.2 12/02/2015 1130   BUN 14 08/25/2006 1353   CREATININE 0.8 12/02/2015 1130   CREATININE 0.90 08/25/2006 1353      Component Value Date/Time   CALCIUM 9.0 12/02/2015 1130   CALCIUM 9.6 08/25/2006 1353   ALKPHOS 53 12/02/2015 1130   ALKPHOS 45 08/25/2006 1353   AST 16 12/02/2015 1130   AST 18 08/25/2006 1353   ALT 10 12/02/2015 1130   ALT 17 08/25/2006 1353   BILITOT 0.92 12/02/2015 1130   BILITOT 1.2 08/25/2006 1353       RADIOGRAPHIC STUDIES: No results found.  ASSESSMENT AND PLAN:  This is a very pleasant 82 years old white male with recently diagnosed myelodysplastic syndrome consistent with refractory anemia with excess blast.  He is currently undergoing systemic chemotherapy with Vidaza 75 MG/M2 for 5 days every 4 weeks is status post 2 cycles. He tolerated the second cycle of his treatment fairly well. I  recommended for him to proceed with cycle #3 today as a scheduled followed by Neulasta injection. For the refractory anemia, I would consider the patient for treatment with Aranesp 300 g subcutaneously every 3 weeks. I also gave the patient the option of transfusion of 2 units of PRBCs but he declined at this point. For the neutropenia, I will consider the patient for treatment with Neulasta after his chemotherapy. I will see the patient back for follow-up visit in 4 weeks for reevaluation before starting cycle #4. He was advised to call immediately if he has any significant fever or chills or any other signs of infection during this period. The patient voices understanding of current disease status and treatment options and is in agreement with the current care plan.  All questions were answered. The patient knows to call the clinic with any  problems, questions or concerns. We can certainly see the patient much sooner if necessary.  Disclaimer: This note was dictated with voice recognition software. Similar sounding words can inadvertently be transcribed and may not be corrected upon review.

## 2015-12-02 NOTE — Patient Instructions (Signed)
[facility] Discharge Instructions for Patients Receiving Chemotherapy  Today you received the following chemotherapy agents: Vidaza   To help prevent nausea and vomiting after your treatment, we encourage you to take your nausea medication as directed.    If you develop nausea and vomiting that is not controlled by your nausea medication, call the clinic.   BELOW ARE SYMPTOMS THAT SHOULD BE REPORTED IMMEDIATELY:  *FEVER GREATER THAN 100.5 F  *CHILLS WITH OR WITHOUT FEVER  NAUSEA AND VOMITING THAT IS NOT CONTROLLED WITH YOUR NAUSEA MEDICATION  *UNUSUAL SHORTNESS OF BREATH  *UNUSUAL BRUISING OR BLEEDING  TENDERNESS IN MOUTH AND THROAT WITH OR WITHOUT PRESENCE OF ULCERS  *URINARY PROBLEMS  *BOWEL PROBLEMS  UNUSUAL RASH Items with * indicate a potential emergency and should be followed up as soon as possible.  Feel free to call the clinic you have any questions or concerns. The clinic phone number is [phone].  Please show the CHEMO ALERT CARD at check-in to the Emergency Department and triage nurse.   

## 2015-12-02 NOTE — Progress Notes (Unsigned)
Per Dr. Julien Nordmann, okay to tx with platelets 74 and ANC 0.7.

## 2015-12-02 NOTE — Telephone Encounter (Signed)
Gave and printed appt sched and avs for pt for march and April  °

## 2015-12-03 ENCOUNTER — Ambulatory Visit (HOSPITAL_BASED_OUTPATIENT_CLINIC_OR_DEPARTMENT_OTHER): Payer: Medicare Other

## 2015-12-03 VITALS — BP 127/79 | HR 99 | Temp 98.0°F | Resp 18

## 2015-12-03 DIAGNOSIS — Z5111 Encounter for antineoplastic chemotherapy: Secondary | ICD-10-CM | POA: Diagnosis present

## 2015-12-03 DIAGNOSIS — D469 Myelodysplastic syndrome, unspecified: Secondary | ICD-10-CM

## 2015-12-03 MED ORDER — PROCHLORPERAZINE MALEATE 10 MG PO TABS
10.0000 mg | ORAL_TABLET | Freq: Once | ORAL | Status: AC
  Administered 2015-12-03: 10 mg via ORAL

## 2015-12-03 MED ORDER — SODIUM CHLORIDE 0.9 % IV SOLN: Freq: Once | INTRAVENOUS | Status: DC

## 2015-12-03 MED ORDER — PROCHLORPERAZINE MALEATE 10 MG PO TABS
ORAL_TABLET | ORAL | Status: AC
  Filled 2015-12-03: qty 1

## 2015-12-03 MED ORDER — SODIUM CHLORIDE 0.9 % IV SOLN
Freq: Once | INTRAVENOUS | Status: AC
  Administered 2015-12-03: 13:00:00 via INTRAVENOUS

## 2015-12-03 MED ORDER — LACTATED RINGERS IV SOLN
75.0000 mg/m2 | Freq: Every day | INTRAVENOUS | Status: DC
  Administered 2015-12-03: 150 mg via INTRAVENOUS
  Filled 2015-12-03: qty 30

## 2015-12-03 NOTE — Patient Instructions (Signed)
[facility] Discharge Instructions for Patients Receiving Chemotherapy  Today you received the following chemotherapy agents: Vidaza   To help prevent nausea and vomiting after your treatment, we encourage you to take your nausea medication as directed.    If you develop nausea and vomiting that is not controlled by your nausea medication, call the clinic.   BELOW ARE SYMPTOMS THAT SHOULD BE REPORTED IMMEDIATELY:  *FEVER GREATER THAN 100.5 F  *CHILLS WITH OR WITHOUT FEVER  NAUSEA AND VOMITING THAT IS NOT CONTROLLED WITH YOUR NAUSEA MEDICATION  *UNUSUAL SHORTNESS OF BREATH  *UNUSUAL BRUISING OR BLEEDING  TENDERNESS IN MOUTH AND THROAT WITH OR WITHOUT PRESENCE OF ULCERS  *URINARY PROBLEMS  *BOWEL PROBLEMS  UNUSUAL RASH Items with * indicate a potential emergency and should be followed up as soon as possible.  Feel free to call the clinic you have any questions or concerns. The clinic phone number is [phone].  Please show the CHEMO ALERT CARD at check-in to the Emergency Department and triage nurse.   

## 2015-12-03 NOTE — Progress Notes (Signed)
At 1430 c/o nausea with no emesis, Dr. Julien Nordmann called and order received for Zofran 8mg  IV. Refused zofran due to he is afraid it will make him constipated.

## 2015-12-04 ENCOUNTER — Other Ambulatory Visit: Payer: Self-pay | Admitting: *Deleted

## 2015-12-04 ENCOUNTER — Ambulatory Visit (HOSPITAL_BASED_OUTPATIENT_CLINIC_OR_DEPARTMENT_OTHER): Payer: Medicare Other

## 2015-12-04 VITALS — BP 128/59 | HR 84 | Temp 97.6°F | Resp 18

## 2015-12-04 DIAGNOSIS — Z5111 Encounter for antineoplastic chemotherapy: Secondary | ICD-10-CM | POA: Diagnosis present

## 2015-12-04 DIAGNOSIS — D469 Myelodysplastic syndrome, unspecified: Secondary | ICD-10-CM | POA: Diagnosis not present

## 2015-12-04 MED ORDER — LORAZEPAM 2 MG/ML IJ SOLN
INTRAMUSCULAR | Status: AC
  Filled 2015-12-04: qty 1

## 2015-12-04 MED ORDER — PROCHLORPERAZINE MALEATE 10 MG PO TABS
ORAL_TABLET | ORAL | Status: AC
  Filled 2015-12-04: qty 1

## 2015-12-04 MED ORDER — PROCHLORPERAZINE MALEATE 10 MG PO TABS
10.0000 mg | ORAL_TABLET | Freq: Once | ORAL | Status: AC
  Administered 2015-12-04: 10 mg via ORAL

## 2015-12-04 MED ORDER — LORAZEPAM 2 MG/ML IJ SOLN
0.5000 mg | INTRAMUSCULAR | Status: AC
  Administered 2015-12-04: 0.5 mg via INTRAVENOUS

## 2015-12-04 MED ORDER — SODIUM CHLORIDE 0.9 % IV SOLN: Freq: Once | INTRAVENOUS | Status: DC

## 2015-12-04 MED ORDER — AZACITIDINE CHEMO INJECTION 100 MG
75.0000 mg/m2 | Freq: Every day | INTRAMUSCULAR | Status: DC
  Administered 2015-12-04: 150 mg via INTRAVENOUS
  Filled 2015-12-04: qty 30

## 2015-12-04 NOTE — Progress Notes (Signed)
Infusion RN here with pt complaint of nausea.   Pt refusing to get Vidaza until given Ativan given prior.  Pt and wife expressed he takes ativan and compazine at home and it was what was given the last time he was here with nausea.  Infusion RN attempted to discuss home meds vs. treatment plan meds with pt and wife.   Wife and pt again requested ativan for nausea.  MD out of office, reviewed with Ned Card NP. VO Ativan 0.5mg  IV once for nausea..   Order entered.  Infusion RN will discuss with pt  MD will be notified of his requests for ativan with treatment.  Message forward to MD for review.

## 2015-12-04 NOTE — Patient Instructions (Signed)
[facility] Discharge Instructions for Patients Receiving Chemotherapy  Today you received the following chemotherapy agents Vidaza. To help prevent nausea and vomiting after your treatment, we encourage you to take your nausea medication as directed - COMPAZINE IS SAFE TO TAKE EVERY 6-8 HOURS AS NEEDED AND YOU MUST ENSURE YOU ARE ONLY TAKING 0.5MG  OF ATIVAN PER DOSE.  If you develop nausea and vomiting that is not controlled by your nausea medication, call the clinic.   BELOW ARE SYMPTOMS THAT SHOULD BE REPORTED IMMEDIATELY:  *FEVER GREATER THAN 100.5 F  *CHILLS WITH OR WITHOUT FEVER  NAUSEA AND VOMITING THAT IS NOT CONTROLLED WITH YOUR NAUSEA MEDICATION  *UNUSUAL SHORTNESS OF BREATH  *UNUSUAL BRUISING OR BLEEDING  TENDERNESS IN MOUTH AND THROAT WITH OR WITHOUT PRESENCE OF ULCERS  *URINARY PROBLEMS  *BOWEL PROBLEMS  UNUSUAL RASH Items with * indicate a potential emergency and should be followed up as soon as possible.  Feel free to call the clinic you have any questions or concerns. The clinic phone number is [phone].  Please show the [place] at check-in to the Emergency Department and triage nurse.

## 2015-12-05 ENCOUNTER — Ambulatory Visit (HOSPITAL_BASED_OUTPATIENT_CLINIC_OR_DEPARTMENT_OTHER): Payer: Medicare Other

## 2015-12-05 VITALS — BP 114/58 | HR 92 | Temp 97.7°F | Resp 18

## 2015-12-05 DIAGNOSIS — D469 Myelodysplastic syndrome, unspecified: Secondary | ICD-10-CM | POA: Diagnosis not present

## 2015-12-05 DIAGNOSIS — Z5111 Encounter for antineoplastic chemotherapy: Secondary | ICD-10-CM

## 2015-12-05 MED ORDER — PROCHLORPERAZINE MALEATE 10 MG PO TABS: 10.0000 mg | ORAL_TABLET | Freq: Once | ORAL | Status: DC

## 2015-12-05 MED ORDER — SODIUM CHLORIDE 0.9 % IV SOLN
Freq: Once | INTRAVENOUS | Status: AC
  Administered 2015-12-05: 14:00:00 via INTRAVENOUS

## 2015-12-05 MED ORDER — SODIUM CHLORIDE 0.9 % IV SOLN: 10.0000 mg | Freq: Once | INTRAVENOUS | Status: DC

## 2015-12-05 MED ORDER — SODIUM CHLORIDE 0.9 % IV SOLN
10.0000 mg | Freq: Once | INTRAVENOUS | Status: AC
  Administered 2015-12-05: 10 mg via INTRAVENOUS
  Filled 2015-12-05: qty 1

## 2015-12-05 MED ORDER — LACTATED RINGERS IV SOLN
75.0000 mg/m2 | Freq: Every day | INTRAVENOUS | Status: DC
  Administered 2015-12-05: 150 mg via INTRAVENOUS
  Filled 2015-12-05: qty 30

## 2015-12-05 NOTE — Patient Instructions (Addendum)
[facility] Discharge Instructions for Patients Receiving Chemotherapy  Today you received the following chemotherapy agent, Vidaza.  To help prevent nausea and vomiting after your treatment, we encourage you to take your nausea medication as prescribed.  If you develop nausea and vomiting that is not controlled by your nausea medication, call the clinic.   BELOW ARE SYMPTOMS THAT SHOULD BE REPORTED IMMEDIATELY:  *FEVER GREATER THAN 100.5 F  *CHILLS WITH OR WITHOUT FEVER  NAUSEA AND VOMITING THAT IS NOT CONTROLLED WITH YOUR NAUSEA MEDICATION  *UNUSUAL SHORTNESS OF BREATH  *UNUSUAL BRUISING OR BLEEDING  TENDERNESS IN MOUTH AND THROAT WITH OR WITHOUT PRESENCE OF ULCERS  *URINARY PROBLEMS  *BOWEL PROBLEMS  UNUSUAL RASH Items with * indicate a potential emergency and should be followed up as soon as possible.  Feel free to call the clinic you have any questions or concerns. The clinic phone number is [phone].  Please show the [place] at check-in to the Emergency Department and triage nurse.

## 2015-12-06 ENCOUNTER — Ambulatory Visit (HOSPITAL_BASED_OUTPATIENT_CLINIC_OR_DEPARTMENT_OTHER): Payer: Medicare Other

## 2015-12-06 VITALS — BP 114/71 | HR 86 | Temp 97.9°F | Resp 19

## 2015-12-06 DIAGNOSIS — Z5111 Encounter for antineoplastic chemotherapy: Secondary | ICD-10-CM | POA: Diagnosis present

## 2015-12-06 DIAGNOSIS — D469 Myelodysplastic syndrome, unspecified: Secondary | ICD-10-CM | POA: Diagnosis not present

## 2015-12-06 MED ORDER — DEXAMETHASONE SODIUM PHOSPHATE 100 MG/10ML IJ SOLN
10.0000 mg | Freq: Once | INTRAMUSCULAR | Status: AC
  Administered 2015-12-06: 10 mg via INTRAVENOUS
  Filled 2015-12-06: qty 1

## 2015-12-06 MED ORDER — AZACITIDINE CHEMO INJECTION 100 MG
75.0000 mg/m2 | Freq: Every day | INTRAMUSCULAR | Status: DC
  Administered 2015-12-06: 150 mg via INTRAVENOUS
  Filled 2015-12-06: qty 30

## 2015-12-06 MED ORDER — SODIUM CHLORIDE 0.9 % IV SOLN
Freq: Once | INTRAVENOUS | Status: AC
  Administered 2015-12-06: 09:00:00 via INTRAVENOUS

## 2015-12-06 NOTE — Patient Instructions (Signed)
[facility] Discharge Instructions for Patients Receiving Chemotherapy  Today you received the following chemotherapy agents: Vidaza   To help prevent nausea and vomiting after your treatment, we encourage you to take your nausea medication as directed.    If you develop nausea and vomiting that is not controlled by your nausea medication, call the clinic.   BELOW ARE SYMPTOMS THAT SHOULD BE REPORTED IMMEDIATELY:  *FEVER GREATER THAN 100.5 F  *CHILLS WITH OR WITHOUT FEVER  NAUSEA AND VOMITING THAT IS NOT CONTROLLED WITH YOUR NAUSEA MEDICATION  *UNUSUAL SHORTNESS OF BREATH  *UNUSUAL BRUISING OR BLEEDING  TENDERNESS IN MOUTH AND THROAT WITH OR WITHOUT PRESENCE OF ULCERS  *URINARY PROBLEMS  *BOWEL PROBLEMS  UNUSUAL RASH Items with * indicate a potential emergency and should be followed up as soon as possible.  Feel free to call the clinic you have any questions or concerns. The clinic phone number is [phone].  Please show the CHEMO ALERT CARD at check-in to the Emergency Department and triage nurse.   

## 2015-12-07 ENCOUNTER — Ambulatory Visit (HOSPITAL_BASED_OUTPATIENT_CLINIC_OR_DEPARTMENT_OTHER): Payer: Medicare Other

## 2015-12-07 ENCOUNTER — Ambulatory Visit: Payer: Medicare Other

## 2015-12-07 DIAGNOSIS — D539 Nutritional anemia, unspecified: Secondary | ICD-10-CM

## 2015-12-07 DIAGNOSIS — D469 Myelodysplastic syndrome, unspecified: Secondary | ICD-10-CM | POA: Diagnosis present

## 2015-12-07 DIAGNOSIS — D701 Agranulocytosis secondary to cancer chemotherapy: Secondary | ICD-10-CM

## 2015-12-07 MED ORDER — PEGFILGRASTIM INJECTION 6 MG/0.6ML ~~LOC~~
6.0000 mg | PREFILLED_SYRINGE | Freq: Once | SUBCUTANEOUS | Status: AC
  Administered 2015-12-07: 6 mg via SUBCUTANEOUS

## 2015-12-07 NOTE — Patient Instructions (Signed)
Pegfilgrastim injection What is this medicine? PEGFILGRASTIM (PEG fil gra stim) is a long-acting granulocyte colony-stimulating factor that stimulates the growth of neutrophils, a type of white blood cell important in the body's fight against infection. It is used to reduce the incidence of fever and infection in patients with certain types of cancer who are receiving chemotherapy that affects the bone marrow, and to increase survival after being exposed to high doses of radiation. This medicine may be used for other purposes; ask your health care provider or pharmacist if you have questions. What should I tell my health care provider before I take this medicine? They need to know if you have any of these conditions: -kidney disease -latex allergy -ongoing radiation therapy -sickle cell disease -skin reactions to acrylic adhesives (On-Body Injector only) -an unusual or allergic reaction to pegfilgrastim, filgrastim, other medicines, foods, dyes, or preservatives -pregnant or trying to get pregnant -breast-feeding How should I use this medicine? This medicine is for injection under the skin. If you get this medicine at home, you will be taught how to prepare and give the pre-filled syringe or how to use the On-body Injector. Refer to the patient Instructions for Use for detailed instructions. Use exactly as directed. Take your medicine at regular intervals. Do not take your medicine more often than directed. It is important that you put your used needles and syringes in a special sharps container. Do not put them in a trash can. If you do not have a sharps container, call your pharmacist or healthcare provider to get one. Talk to your pediatrician regarding the use of this medicine in children. While this drug may be prescribed for selected conditions, precautions do apply. Overdosage: If you think you have taken too much of this medicine contact a poison control center or emergency room at  once. NOTE: This medicine is only for you. Do not share this medicine with others. What if I miss a dose? It is important not to miss your dose. Call your doctor or health care professional if you miss your dose. If you miss a dose due to an On-body Injector failure or leakage, a new dose should be administered as soon as possible using a single prefilled syringe for manual use. What may interact with this medicine? Interactions have not been studied. Give your health care provider a list of all the medicines, herbs, non-prescription drugs, or dietary supplements you use. Also tell them if you smoke, drink alcohol, or use illegal drugs. Some items may interact with your medicine. This list may not describe all possible interactions. Give your health care provider a list of all the medicines, herbs, non-prescription drugs, or dietary supplements you use. Also tell them if you smoke, drink alcohol, or use illegal drugs. Some items may interact with your medicine. What should I watch for while using this medicine? You may need blood work done while you are taking this medicine. If you are going to need a MRI, CT scan, or other procedure, tell your doctor that you are using this medicine (On-Body Injector only). What side effects may I notice from receiving this medicine? Side effects that you should report to your doctor or health care professional as soon as possible: -allergic reactions like skin rash, itching or hives, swelling of the face, lips, or tongue -dizziness -fever -pain, redness, or irritation at site where injected -pinpoint red spots on the skin -red or dark-brown urine -shortness of breath or breathing problems -stomach or side pain, or pain   at the shoulder -swelling -tiredness -trouble passing urine or change in the amount of urine Side effects that usually do not require medical attention (report to your doctor or health care professional if they continue or are  bothersome): -bone pain -muscle pain This list may not describe all possible side effects. Call your doctor for medical advice about side effects. You may report side effects to FDA at 1-800-FDA-1088. Where should I keep my medicine? Keep out of the reach of children. Store pre-filled syringes in a refrigerator between 2 and 8 degrees C (36 and 46 degrees F). Do not freeze. Keep in carton to protect from light. Throw away this medicine if it is left out of the refrigerator for more than 48 hours. Throw away any unused medicine after the expiration date. NOTE: This sheet is a summary. It may not cover all possible information. If you have questions about this medicine, talk to your doctor, pharmacist, or health care provider.    2016, Elsevier/[employer] Standard. (2014-10-04 14:30:14)  

## 2015-12-09 ENCOUNTER — Other Ambulatory Visit: Payer: Self-pay | Admitting: Medical Oncology

## 2015-12-09 ENCOUNTER — Ambulatory Visit (HOSPITAL_BASED_OUTPATIENT_CLINIC_OR_DEPARTMENT_OTHER): Payer: Medicare Other

## 2015-12-09 ENCOUNTER — Telehealth: Payer: Self-pay | Admitting: Internal Medicine

## 2015-12-09 ENCOUNTER — Ambulatory Visit (HOSPITAL_COMMUNITY)
Admission: RE | Admit: 2015-12-09 | Discharge: 2015-12-09 | Disposition: A | Discharge status: Home / Self Care | Payer: Medicare Other | Source: Ambulatory Visit | Attending: Internal Medicine | Admitting: Internal Medicine

## 2015-12-09 ENCOUNTER — Other Ambulatory Visit (HOSPITAL_BASED_OUTPATIENT_CLINIC_OR_DEPARTMENT_OTHER): Payer: Medicare Other

## 2015-12-09 DIAGNOSIS — I951 Orthostatic hypotension: Secondary | ICD-10-CM | POA: Diagnosis not present

## 2015-12-09 DIAGNOSIS — D4620 Refractory anemia with excess of blasts, unspecified: Secondary | ICD-10-CM | POA: Diagnosis present

## 2015-12-09 DIAGNOSIS — D649 Anemia, unspecified: Secondary | ICD-10-CM | POA: Diagnosis not present

## 2015-12-09 LAB — COMPREHENSIVE METABOLIC PANEL
ALT: 12 U/L (ref 0–55)
ANION GAP: 7 meq/L (ref 3–11)
AST: 37 U/L — ABNORMAL HIGH (ref 5–34)
Albumin: 3.8 g/dL (ref 3.5–5.0)
Alkaline Phosphatase: 67 U/L (ref 40–150)
BUN: 12 mg/dL (ref 7.0–26.0)
CHLORIDE: 103 meq/L (ref 98–109)
CO2: 23 meq/L (ref 22–29)
Calcium: 9.1 mg/dL (ref 8.4–10.4)
Creatinine: 1 mg/dL (ref 0.7–1.3)
EGFR: 73 mL/min/{1.73_m2} — AB (ref >90)
Glucose: 144 mg/dl — ABNORMAL HIGH (ref 70–140)
Potassium: 4.2 mEq/L (ref 3.5–5.1)
Sodium: 134 mEq/L — ABNORMAL LOW (ref 136–145)
Total Bilirubin: 1.16 mg/dL (ref 0.20–1.20)
Total Protein: 6.6 g/dL (ref 6.4–8.3)

## 2015-12-09 LAB — CBC WITH DIFFERENTIAL/PLATELET
BASO%: 0.9 % (ref 0.0–2.0)
BASOS ABS: 0.2 10*3/uL — AB (ref 0.0–0.1)
EOS ABS: 0 10*3/uL (ref 0.0–0.5)
EOS%: 0.2 % (ref 0.0–7.0)
HEMATOCRIT: 23 % — AB (ref 38.4–49.9)
HEMOGLOBIN: 7.6 g/dL — AB (ref 13.0–17.1)
LYMPH#: 2.4 10*3/uL (ref 0.9–3.3)
LYMPH%: 11.9 % — ABNORMAL LOW (ref 14.0–49.0)
MCH: 36 pg — ABNORMAL HIGH (ref 27.2–33.4)
MCHC: 33 g/dL (ref 32.0–36.0)
MCV: 109 fL — ABNORMAL HIGH (ref 79.3–98.0)
MONO#: 2 10*3/uL — AB (ref 0.1–0.9)
MONO%: 10.1 % (ref 0.0–14.0)
NEUT%: 76.9 % — ABNORMAL HIGH (ref 39.0–75.0)
NEUTROS ABS: 15.2 10*3/uL — AB (ref 1.5–6.5)
NRBC: 1 % — AB (ref 0–0)
Platelets: 87 10*3/uL — ABNORMAL LOW (ref 140–400)
RBC: 2.11 10*6/uL — ABNORMAL LOW (ref 4.20–5.82)
RDW: 22.5 % — AB (ref 11.0–14.6)
WBC: 19.8 10*3/uL — ABNORMAL HIGH (ref 4.0–10.3)

## 2015-12-09 LAB — ABO/RH: ABO/RH(D): A POS

## 2015-12-09 LAB — TECHNOLOGIST REVIEW

## 2015-12-09 MED ORDER — SODIUM CHLORIDE 0.9 % IV SOLN
Freq: Once | INTRAVENOUS | Status: DC
  Administered 2015-12-09: 16:00:00 via INTRAVENOUS

## 2015-12-09 NOTE — Telephone Encounter (Signed)
per pof to sch pt for blood-Diane to work on fluids

## 2015-12-09 NOTE — Patient Instructions (Signed)

## 2015-12-09 NOTE — Progress Notes (Signed)
har done 

## 2015-12-09 NOTE — Telephone Encounter (Signed)
Patient currently in inf area having IVF's - Blood added for tomorrow. Spoke with infusion and patient will be given appointment for blood tomorrow in the infusion area. Per desk nurse patient has already been typed and crossed. No other orders per 3/13 pof's.

## 2015-12-09 NOTE — Progress Notes (Signed)
At time of discharge pt reports throbbing headache that is abnormal for him. Pt denies any other s/s such as dizziness, vision changes, Etc. Dr. Marko Plume aware. Per Dr. Marko Plume okay to discharge home. Instructed pt to eat a substantial meal, push fluids, take tylenol, and have wife pick him up from the clinic, and that if headache worsens or other symptoms start, report to the ED immediately. Pt verbalizes understanding. Pt states he will call his wife and wait for her in the waiting room. Pt and VS stable at time of discharge.

## 2015-12-10 ENCOUNTER — Ambulatory Visit (HOSPITAL_BASED_OUTPATIENT_CLINIC_OR_DEPARTMENT_OTHER): Payer: Medicare Other

## 2015-12-10 VITALS — BP 112/53 | HR 84 | Temp 98.6°F | Resp 18

## 2015-12-10 DIAGNOSIS — D649 Anemia, unspecified: Secondary | ICD-10-CM | POA: Diagnosis present

## 2015-12-10 LAB — PREPARE RBC (CROSSMATCH)

## 2015-12-10 MED ORDER — ACETAMINOPHEN 325 MG PO TABS
650.0000 mg | ORAL_TABLET | Freq: Once | ORAL | Status: AC
  Administered 2015-12-10: 650 mg via ORAL

## 2015-12-10 MED ORDER — DIPHENHYDRAMINE HCL 25 MG PO CAPS
ORAL_CAPSULE | ORAL | Status: AC
  Filled 2015-12-10: qty 1

## 2015-12-10 MED ORDER — SODIUM CHLORIDE 0.9 % IV SOLN
250.0000 mL | Freq: Once | INTRAVENOUS | Status: AC
  Administered 2015-12-10: 250 mL via INTRAVENOUS

## 2015-12-10 MED ORDER — ACETAMINOPHEN 325 MG PO TABS
ORAL_TABLET | ORAL | Status: AC
  Filled 2015-12-10: qty 2

## 2015-12-10 MED ORDER — DIPHENHYDRAMINE HCL 25 MG PO CAPS
25.0000 mg | ORAL_CAPSULE | Freq: Once | ORAL | Status: AC
  Administered 2015-12-10: 25 mg via ORAL

## 2015-12-10 NOTE — Patient Instructions (Signed)

## 2015-12-11 LAB — TYPE AND SCREEN
ABO/RH(D): A POS
Antibody Screen: NEGATIVE
UNIT DIVISION: 0
UNIT DIVISION: 0

## 2015-12-16 ENCOUNTER — Other Ambulatory Visit (HOSPITAL_BASED_OUTPATIENT_CLINIC_OR_DEPARTMENT_OTHER): Payer: Medicare Other

## 2015-12-16 ENCOUNTER — Telehealth: Payer: Self-pay | Admitting: Internal Medicine

## 2015-12-16 ENCOUNTER — Encounter: Payer: Self-pay | Admitting: *Deleted

## 2015-12-16 ENCOUNTER — Ambulatory Visit (HOSPITAL_BASED_OUTPATIENT_CLINIC_OR_DEPARTMENT_OTHER): Payer: Medicare Other

## 2015-12-16 VITALS — BP 114/66 | HR 80 | Temp 99.6°F

## 2015-12-16 DIAGNOSIS — D469 Myelodysplastic syndrome, unspecified: Secondary | ICD-10-CM | POA: Diagnosis present

## 2015-12-16 DIAGNOSIS — D539 Nutritional anemia, unspecified: Secondary | ICD-10-CM

## 2015-12-16 DIAGNOSIS — D4620 Refractory anemia with excess of blasts, unspecified: Secondary | ICD-10-CM

## 2015-12-16 LAB — CBC WITH DIFFERENTIAL/PLATELET
BASO%: 0.6 % (ref 0.0–2.0)
BASOS ABS: 0.1 10*3/uL (ref 0.0–0.1)
EOS%: 0.6 % (ref 0.0–7.0)
Eosinophils Absolute: 0.1 10*3/uL (ref 0.0–0.5)
HCT: 27.4 % — ABNORMAL LOW (ref 38.4–49.9)
HGB: 9.1 g/dL — ABNORMAL LOW (ref 13.0–17.1)
LYMPH%: 14.8 % (ref 14.0–49.0)
MCH: 34.3 pg — AB (ref 27.2–33.4)
MCHC: 33.2 g/dL (ref 32.0–36.0)
MCV: 103.4 fL — AB (ref 79.3–98.0)
MONO#: 2.7 10*3/uL — ABNORMAL HIGH (ref 0.1–0.9)
MONO%: 30.3 % — AB (ref 0.0–14.0)
NEUT#: 4.8 10*3/uL (ref 1.5–6.5)
NEUT%: 53.7 % (ref 39.0–75.0)
NRBC: 1 % — AB (ref 0–0)
Platelets: 26 10*3/uL — ABNORMAL LOW (ref 140–400)
RBC: 2.65 10*6/uL — AB (ref 4.20–5.82)
RDW: 24.3 % — AB (ref 11.0–14.6)
WBC: 8.8 10*3/uL (ref 4.0–10.3)
lymph#: 1.3 10*3/uL (ref 0.9–3.3)

## 2015-12-16 LAB — COMPREHENSIVE METABOLIC PANEL
ALT: 11 U/L (ref 0–55)
AST: 26 U/L (ref 5–34)
Albumin: 3.8 g/dL (ref 3.5–5.0)
Alkaline Phosphatase: 62 U/L (ref 40–150)
Anion Gap: 8 mEq/L (ref 3–11)
BUN: 18.7 mg/dL (ref 7.0–26.0)
CHLORIDE: 102 meq/L (ref 98–109)
CO2: 24 meq/L (ref 22–29)
Calcium: 9.2 mg/dL (ref 8.4–10.4)
Creatinine: 0.9 mg/dL (ref 0.7–1.3)
EGFR: 80 mL/min/{1.73_m2} — AB (ref >90)
GLUCOSE: 97 mg/dL (ref 70–140)
POTASSIUM: 4.4 meq/L (ref 3.5–5.1)
SODIUM: 135 meq/L — AB (ref 136–145)
Total Bilirubin: 1.07 mg/dL (ref 0.20–1.20)
Total Protein: 7.4 g/dL (ref 6.4–8.3)

## 2015-12-16 MED ORDER — DARBEPOETIN ALFA 300 MCG/0.6ML IJ SOSY
300.0000 ug | PREFILLED_SYRINGE | Freq: Once | INTRAMUSCULAR | Status: AC
  Administered 2015-12-16: 300 ug via SUBCUTANEOUS
  Filled 2015-12-16: qty 0.6

## 2015-12-16 NOTE — Telephone Encounter (Signed)
per pof to sch pt appt-pt req to see Julien Nordmann wanted to CX all future appts-adv to do so @ appt 3/23

## 2015-12-16 NOTE — Progress Notes (Signed)
Oncology Nurse Navigator Documentation  Oncology Nurse Navigator Flowsheets 12/16/2015  Navigator Encounter Type Treatment  Treatment Phase Treatment  Barriers/Navigation Needs Education  Acuity Level 2  Time Spent with Patient 15   I received a call from CL in lab.  He stated the patient would like to stop treatment and needs someone to speak with.  I spoke with Dr. Julien Nordmann.  He stated that would be fine but needs to see him to have a conversation about stopping treatment.  I went to speak with patient.  I listened as he explained his feelings about stopping treatment.  I relayed Dr. Worthy Flank request and patient is scheduled to see Dr. Julien Nordmann on 12/30/15.  He stated he would like to change that appt.  I walked him to scheduling and updated Dr. Julien Nordmann.

## 2015-12-19 ENCOUNTER — Encounter: Payer: Self-pay | Admitting: Internal Medicine

## 2015-12-19 ENCOUNTER — Other Ambulatory Visit: Payer: Self-pay | Admitting: Medical Oncology

## 2015-12-19 ENCOUNTER — Ambulatory Visit (HOSPITAL_BASED_OUTPATIENT_CLINIC_OR_DEPARTMENT_OTHER): Payer: Medicare Other | Admitting: Internal Medicine

## 2015-12-19 ENCOUNTER — Telehealth: Payer: Self-pay | Admitting: Hematology

## 2015-12-19 ENCOUNTER — Other Ambulatory Visit: Payer: Medicare Other

## 2015-12-19 VITALS — BP 107/51 | HR 100 | Temp 98.1°F | Resp 17 | Ht 72.0 in | Wt 169.6 lb

## 2015-12-19 DIAGNOSIS — R530 Neoplastic (malignant) related fatigue: Secondary | ICD-10-CM

## 2015-12-19 DIAGNOSIS — D701 Agranulocytosis secondary to cancer chemotherapy: Secondary | ICD-10-CM

## 2015-12-19 DIAGNOSIS — D469 Myelodysplastic syndrome, unspecified: Secondary | ICD-10-CM

## 2015-12-19 DIAGNOSIS — E46 Unspecified protein-calorie malnutrition: Secondary | ICD-10-CM

## 2015-12-19 DIAGNOSIS — T451X5A Adverse effect of antineoplastic and immunosuppressive drugs, initial encounter: Secondary | ICD-10-CM

## 2015-12-19 DIAGNOSIS — R634 Abnormal weight loss: Secondary | ICD-10-CM

## 2015-12-19 DIAGNOSIS — D539 Nutritional anemia, unspecified: Secondary | ICD-10-CM

## 2015-12-19 DIAGNOSIS — D61818 Other pancytopenia: Secondary | ICD-10-CM | POA: Diagnosis not present

## 2015-12-19 DIAGNOSIS — D4620 Refractory anemia with excess of blasts, unspecified: Secondary | ICD-10-CM | POA: Diagnosis present

## 2015-12-19 MED ORDER — DRONABINOL 2.5 MG PO CAPS: 2.5000 mg | ORAL_CAPSULE | Freq: Two times a day (BID) | ORAL | Status: DC

## 2015-12-19 MED ORDER — LIDOCAINE-PRILOCAINE 2.5-2.5 % EX CREA: 1.0000 "application " | TOPICAL_CREAM | CUTANEOUS | Status: DC | PRN

## 2015-12-19 NOTE — Progress Notes (Signed)
i sent form to [employer]

## 2015-12-19 NOTE — Progress Notes (Signed)
[facility] [phone]   Fax:(336) [address], [address] [place] [address]  DIAGNOSIS: 1)  myelodysplastic syndrome consistent with refractory anemia with excess blast diagnosed in December 2016 2)  History of ITP  PRIOR THERAPY: None  CURRENT THERAPY:  1) Vidaza 75 mg/m2 IV for 5 days every 4 weeks. Status post 3 cycles. 2) Supportive care with Aranesp 300 g subcutaneously every 3 weeks in addition to Granix 300 mcg subcutaneously for neutropenia as needed.  INTERVAL HISTORY: Dean Stanley 82 y.o. male returns to the clinic today for follow-up visit accompanied by his wife. The patient continues to complain of fatigue and shortness breath with exertion. He is concerned about the multiple sticks he had with his chemotherapy. He is considering Port-A-Cath placement if he decided to continue with further chemotherapy. He was at the point of quiting his treatment. He tolerated the third cycle of his treatment fairly well with no significant adverse effects except for mild nausea. He continues to have pancytopenia but no fever or chills. He also complains of lack of appetite and weight loss. He has no bleeding issues. He denied having any significant chest pain, cough or hemoptysis. He is expected to start cycle #4 on 12/30/2015.  MEDICAL HISTORY: Past Medical History  Diagnosis Date  . Meniere's syndrome 1980s  . History of ITP 1960  . Anemia   . Complication of anesthesia     Reaction to anesthesia " things went haywire around me"  . Hypothyroidism   . Hearing impairment     wears hearing left ear  . Cancer (HCC)     myelodysplastic syndrome  . Do not resuscitate 11/04/2015    ALLERGIES:  is allergic to hydrocodone and oxycodone.  MEDICATIONS:  Current Outpatient Prescriptions  Medication Sig Dispense Refill  . desmopressin (DDAVP) 0.2 MG tablet Take 0.2 mg by mouth daily.    .  diphenhydrAMINE (BENADRYL) 25 MG tablet Take 25 mg by mouth every 6 (six) hours as needed for itching.    . famotidine (PEPCID) 20 MG tablet Take 20 mg by mouth 2 (two) times daily.    . ferrous sulfate 325 (65 FE) MG tablet Take 325 mg by mouth daily with breakfast.    . finasteride (PROPECIA) 1 MG tablet Take 1 mg by mouth daily.    Marland Kitchen levothyroxine (SYNTHROID, LEVOTHROID) 200 MCG tablet Take 200 mcg by mouth daily.    Marland Kitchen lidocaine (XYLOCAINE) 2 % jelly 1 application as needed (for rectal area as needed).    . LORazepam (ATIVAN) 0.5 MG tablet Take 1 tablet (0.5 mg total) by mouth every 8 (eight) hours. 30 tablet 0  . Lutein 40 MG CAPS Take 40 mg by mouth daily.    . Multiple Vitamins-Minerals (CENTRUM ADULTS PO) Take 1 tablet by mouth daily.     Marland Kitchen nystatin-triamcinolone ointment (MYCOLOG) Apply 1 application topically 2 (two) times daily. 30 g 0  . polyethylene glycol (MIRALAX / GLYCOLAX) packet Take 17 g by mouth daily as needed for mild constipation.    . prochlorperazine (COMPAZINE) 10 MG tablet Take 1 tablet (10 mg total) by mouth every 8 (eight) hours as needed for nausea or vomiting. 30 tablet 0  . senna-docusate (SENNA S) 8.6-50 MG tablet Take 2 tablets by mouth every 6 (six) hours as needed for mild constipation.     No current facility-administered medications for this visit.   Facility-Administered  Medications Ordered in Other Visits  Medication Dose Route Frequency Provider Last Rate Last Dose  . azaCITIDine (VIDAZA) 150 mg in lactated ringers 45 mL chemo infusion  75 mg/m2 (Treatment Plan Actual) Intravenous Daily Curt Bears, MD   150 mg at 12/02/15 1428  . heparin lock flush 100 unit/mL  500 Units Intracatheter Once PRN Curt Bears, MD      . sodium chloride 0.9 % injection 10 mL  10 mL Intracatheter PRN Curt Bears, MD        SURGICAL HISTORY:  Past Surgical History  Procedure Laterality Date  . Colonoscopy    . Cortisone injections to back x 6    . Circumcision     . Cataract Right 2011  . Orif orbital fracture Left 2000    2 fractures orbital platform    REVIEW OF SYSTEMS:  Constitutional: positive for anorexia, fatigue and weight loss Eyes: negative Ears, nose, mouth, throat, and face: negative Respiratory: positive for cough and dyspnea on exertion Cardiovascular: negative Gastrointestinal: negative Genitourinary:negative Integument/breast: negative Hematologic/lymphatic: negative Musculoskeletal:positive for muscle weakness Neurological: negative Behavioral/Psych: negative Endocrine: negative Allergic/Immunologic: negative   PHYSICAL EXAMINATION: General appearance: alert, cooperative, fatigued and no distress Head: Normocephalic, without obvious abnormality, atraumatic Neck: no adenopathy, no JVD, supple, symmetrical, trachea midline and thyroid not enlarged, symmetric, no tenderness/mass/nodules Lymph nodes: Cervical, supraclavicular, and axillary nodes normal. Resp: clear to auscultation bilaterally Back: symmetric, no curvature. ROM normal. No CVA tenderness. Cardio: regular rate and rhythm, S1, S2 normal, no murmur, click, rub or gallop GI: soft, non-tender; bowel sounds normal; no masses,  no organomegaly Extremities: extremities normal, atraumatic, no cyanosis or edema Neurologic: Alert and oriented X 3, normal strength and tone. Normal symmetric reflexes. Normal coordination and gait  ECOG PERFORMANCE STATUS: 1 - Symptomatic but completely ambulatory  Blood pressure 107/51, pulse 100, temperature 98.1 F (36.7 C), temperature source Oral, resp. rate 17, height 6' (1.829 m), weight 169 lb 9.6 oz (76.93 kg), SpO2 99 %.  LABORATORY DATA: Lab Results  Component Value Date   WBC 8.8 12/16/2015   HGB 9.1* 12/16/2015   HCT 27.4* 12/16/2015   MCV 103.4* 12/16/2015   PLT 26* 12/16/2015      Chemistry      Component Value Date/Time   NA 135* 12/16/2015 1222   NA 141 08/25/2006 1353   K 4.4 12/16/2015 1222   K 4.4  08/25/2006 1353   CL 104 08/25/2006 1353   CO2 24 12/16/2015 1222   CO2 29 08/25/2006 1353   BUN 18.7 12/16/2015 1222   BUN 14 08/25/2006 1353   CREATININE 0.9 12/16/2015 1222   CREATININE 0.90 08/25/2006 1353      Component Value Date/Time   CALCIUM 9.2 12/16/2015 1222   CALCIUM 9.6 08/25/2006 1353   ALKPHOS 62 12/16/2015 1222   ALKPHOS 45 08/25/2006 1353   AST 26 12/16/2015 1222   AST 18 08/25/2006 1353   ALT 11 12/16/2015 1222   ALT 17 08/25/2006 1353   BILITOT 1.07 12/16/2015 1222   BILITOT 1.2 08/25/2006 1353       RADIOGRAPHIC STUDIES: No results found.  ASSESSMENT AND PLAN:  This is a very pleasant 82 years old white male with recently diagnosed myelodysplastic syndrome consistent with refractory anemia with excess blast.  He is currently undergoing systemic chemotherapy with Vidaza 75 MG/M2 for 5 days every 4 weeks is status post 3 cycles. He tolerated the third cycle of his treatment fairly well except for the persistent fatigue and pancytopenia  secondary to his disease. I recommended for him to proceed with cycle #4 as a scheduled for the week of 12/30/2015 followed by Neulasta injection. He was given the option of switching his treatment with Vidaza to subcutaneous injection but he declined. For the refractory anemia, I would consider the patient for treatment with Aranesp 300 g subcutaneously every 3 weeks. For the neutropenia, he will continue treatment with Neulasta after his chemotherapy. For IV access, I will arrange for the patient to have Port-A-Cath placement next week. I also ordered Emla cream to be applied to the Port-A-Cath site before treatment. For the lack of appetite and weight loss, I started the patient on Marinol 2.5 mg by mouth twice a day and I referred him to the dietitian at the [facility] for evaluation of his nutritional status. The patient would come back for follow-up visit in 2 weeks for reevaluation and management of any adverse effect of  his treatment. He was advised to call immediately if he has any significant fever or chills or any other signs of infection during this period. The patient voices understanding of current disease status and treatment options and is in agreement with the current care plan.  All questions were answered. The patient knows to call the clinic with any problems, questions or concerns. We can certainly see the patient much sooner if necessary.  Disclaimer: This note was dictated with voice recognition software. Similar sounding words can inadvertently be transcribed and may not be corrected upon review.

## 2015-12-19 NOTE — Telephone Encounter (Signed)
per pof to sch pt appt-appt already sch-adv IR will call to sch pt to sch trmt-gave avs sent MW email for platlets

## 2015-12-20 ENCOUNTER — Encounter: Payer: Self-pay | Admitting: Internal Medicine

## 2015-12-20 NOTE — Progress Notes (Signed)
dronimal prior auth sent

## 2015-12-20 NOTE — Progress Notes (Signed)
Dronabinol approved 09/21/15-03/21/17/[employer]  Sent to medical records.

## 2015-12-23 ENCOUNTER — Encounter: Payer: Self-pay | Admitting: Medical Oncology

## 2015-12-23 ENCOUNTER — Telehealth: Payer: Self-pay | Admitting: Medical Oncology

## 2015-12-23 ENCOUNTER — Other Ambulatory Visit: Payer: Medicare Other

## 2015-12-23 DIAGNOSIS — Z95828 Presence of other vascular implants and grafts: Secondary | ICD-10-CM | POA: Insufficient documentation

## 2015-12-23 HISTORY — DX: Presence of other vascular implants and grafts: Z95.828

## 2015-12-23 MED ORDER — LIDOCAINE-PRILOCAINE 2.5-2.5 % EX CREA: 1.0000 "application " | TOPICAL_CREAM | CUTANEOUS | Status: DC | PRN

## 2015-12-23 NOTE — Telephone Encounter (Addendum)
called [facility] in response to fax for auth for emla cream.Prior auth questions answered-and cream auth per [facility]. I tried to call [facility] to rerun it and line busy. Wife notified.

## 2015-12-24 ENCOUNTER — Encounter (HOSPITAL_COMMUNITY): Payer: Medicare Other

## 2015-12-24 ENCOUNTER — Encounter: Payer: Self-pay | Admitting: Oncology

## 2015-12-24 ENCOUNTER — Other Ambulatory Visit: Payer: Self-pay | Admitting: Medical Oncology

## 2015-12-24 ENCOUNTER — Ambulatory Visit: Payer: Medicare Other | Admitting: Nutrition

## 2015-12-24 ENCOUNTER — Other Ambulatory Visit (HOSPITAL_BASED_OUTPATIENT_CLINIC_OR_DEPARTMENT_OTHER): Payer: Medicare Other

## 2015-12-24 ENCOUNTER — Other Ambulatory Visit: Payer: Medicare Other

## 2015-12-24 ENCOUNTER — Encounter: Payer: Medicare Other | Admitting: Nutrition

## 2015-12-24 ENCOUNTER — Telehealth: Payer: Self-pay | Admitting: *Deleted

## 2015-12-24 ENCOUNTER — Telehealth: Payer: Self-pay | Admitting: Internal Medicine

## 2015-12-24 DIAGNOSIS — D649 Anemia, unspecified: Secondary | ICD-10-CM

## 2015-12-24 DIAGNOSIS — D4620 Refractory anemia with excess of blasts, unspecified: Secondary | ICD-10-CM

## 2015-12-24 LAB — CBC WITH DIFFERENTIAL/PLATELET
BASO%: 1.2 % (ref 0.0–2.0)
Basophils Absolute: 0 10*3/uL (ref 0.0–0.1)
EOS%: 0.6 % (ref 0.0–7.0)
Eosinophils Absolute: 0 10*3/uL (ref 0.0–0.5)
HCT: 23 % — ABNORMAL LOW (ref 38.4–49.9)
HGB: 7.4 g/dL — ABNORMAL LOW (ref 13.0–17.1)
LYMPH%: 17.2 % (ref 14.0–49.0)
MCH: 33.7 pg — AB (ref 27.2–33.4)
MCHC: 32.2 g/dL (ref 32.0–36.0)
MCV: 104.7 fL — AB (ref 79.3–98.0)
MONO#: 0.9 10*3/uL (ref 0.1–0.9)
MONO%: 38.5 % — AB (ref 0.0–14.0)
NEUT%: 42.5 % (ref 39.0–75.0)
NEUTROS ABS: 1 10*3/uL — AB (ref 1.5–6.5)
PLATELETS: 103 10*3/uL — AB (ref 140–400)
RBC: 2.2 10*6/uL — AB (ref 4.20–5.82)
RDW: 27.2 % — ABNORMAL HIGH (ref 11.0–14.6)
WBC: 2.3 10*3/uL — ABNORMAL LOW (ref 4.0–10.3)
lymph#: 0.4 10*3/uL — ABNORMAL LOW (ref 0.9–3.3)

## 2015-12-24 LAB — COMPREHENSIVE METABOLIC PANEL
ALBUMIN: 3.1 g/dL — AB (ref 3.5–5.0)
ALK PHOS: 46 U/L (ref 40–150)
ALT: 15 U/L (ref 0–55)
ANION GAP: 6 meq/L (ref 3–11)
AST: 18 U/L (ref 5–34)
BILIRUBIN TOTAL: 1.15 mg/dL (ref 0.20–1.20)
BUN: 12.6 mg/dL (ref 7.0–26.0)
CO2: 24 meq/L (ref 22–29)
Calcium: 8.4 mg/dL (ref 8.4–10.4)
Chloride: 101 mEq/L (ref 98–109)
Creatinine: 0.8 mg/dL (ref 0.7–1.3)
EGFR: 84 mL/min/{1.73_m2} — AB (ref >90)
GLUCOSE: 110 mg/dL (ref 70–140)
POTASSIUM: 4.2 meq/L (ref 3.5–5.1)
SODIUM: 131 meq/L — AB (ref 136–145)
TOTAL PROTEIN: 6.6 g/dL (ref 6.4–8.3)

## 2015-12-24 NOTE — Telephone Encounter (Signed)
Per staff message and POF I have scheduled appts. Advised scheduler of appts. JMW  

## 2015-12-24 NOTE — Progress Notes (Signed)
82 year old male diagnosed with MDS.  He is a patient of Dr. Julien Nordmann.  Past medical history includes Mnire's, ITP, anemia, hypothyroidism, and hard of hearing.  Medications include Marinol, Pepcid, ferrous sulfate, Synthroid, Ativan, multivitamin, MiraLAX, Compazine, and senna.  Labs include sodium 131, and albumin 3.1.  Noted hemoglobin below 8.  Height: 6 feet 0 inches. Weight: 169.6 pounds on March 23. Usual body weight: 179 pounds in January. BMI: 23.  Met with patient and his wife. Patient reports increased fatigue and weakness.  He has a hard time doing anything during the day but sleeping. Reports poor appetite and nausea for 2 weeks after treatment. Patient describes taste alterations. He tried boost and ensure and refuses both oral nutrition supplements. Reports nausea is not currently controlled on Compazine. Patient did not like the way Marinol made him feel and is not sure if he can continue.  Nutrition diagnosis: Unintended weight loss related to poor appetite and nausea as evidenced by 10 pound weight loss.  Intervention:  Educated patient and wife about different ways to increase fluid intake.  I recommended patient try Gatorade or Pedialyte. Educated patient to try bites and sips of food frequently throughout the day. Educated patient on strategies for improving nausea and provided fact sheets. Also provided fact sheets on increasing calories and protein, taste alterations, poor appetite. Recommended patient try Carnation breakfast essentials Reviewed food options.  Monitoring, evaluation, goals: Patient will tolerate increased fluids and adequate calories to minimize weight loss and prevent dehydration.  Next visit: Wednesday, April 5, during infusion.  **Disclaimer: This note was dictated with voice recognition software. Similar sounding words can inadvertently be transcribed and this note may contain transcription errors which may not have been corrected upon  publication of note.**

## 2015-12-24 NOTE — Progress Notes (Signed)
[facility] -last went to [facility]

## 2015-12-24 NOTE — Telephone Encounter (Signed)
per Diane to sch pt @ [facility] for 2 units of blood-appt sch

## 2015-12-25 ENCOUNTER — Ambulatory Visit (HOSPITAL_COMMUNITY)
Admission: RE | Admit: 2015-12-25 | Discharge: 2015-12-25 | Disposition: A | Discharge status: Home / Self Care | Payer: Medicare Other | Source: Ambulatory Visit | Attending: Internal Medicine | Admitting: Internal Medicine

## 2015-12-25 ENCOUNTER — Other Ambulatory Visit: Payer: Medicare Other

## 2015-12-25 ENCOUNTER — Other Ambulatory Visit: Payer: Self-pay | Admitting: Radiology

## 2015-12-25 ENCOUNTER — Telehealth: Payer: Self-pay | Admitting: Medical Oncology

## 2015-12-25 VITALS — BP 105/52 | HR 76 | Temp 97.7°F | Resp 16

## 2015-12-25 DIAGNOSIS — D649 Anemia, unspecified: Secondary | ICD-10-CM

## 2015-12-25 LAB — PREPARE RBC (CROSSMATCH)

## 2015-12-25 MED ORDER — ACETAMINOPHEN 325 MG PO TABS
650.0000 mg | ORAL_TABLET | Freq: Once | ORAL | Status: AC
  Administered 2015-12-25: 650 mg via ORAL
  Filled 2015-12-25: qty 2

## 2015-12-25 MED ORDER — SODIUM CHLORIDE 0.9 % IV SOLN
250.0000 mL | Freq: Once | INTRAVENOUS | Status: AC
  Administered 2015-12-25: 250 mL via INTRAVENOUS

## 2015-12-25 MED ORDER — DIPHENHYDRAMINE HCL 25 MG PO CAPS
25.0000 mg | ORAL_CAPSULE | Freq: Once | ORAL | Status: AC
  Administered 2015-12-25: 25 mg via ORAL
  Filled 2015-12-25: qty 1

## 2015-12-25 NOTE — Discharge Instructions (Signed)

## 2015-12-25 NOTE — Telephone Encounter (Signed)
Rip Harbour notified that pt may need platelets Friday morning prior to procedure

## 2015-12-25 NOTE — Progress Notes (Signed)
PCP: Dr. Lorene Dy  Associated Diagnosis: Symptomatic anemia   Procedure: 2 units PRBC's infused as ordered.   Patient tolerated transfusion well. No reaction. Went over discharge instructions with patient and he voiced understanding. Alert, oriented and ambulatory. Discharged to home.

## 2015-12-26 ENCOUNTER — Other Ambulatory Visit: Payer: Self-pay | Admitting: Radiology

## 2015-12-26 ENCOUNTER — Other Ambulatory Visit: Payer: Self-pay | Admitting: Medical Oncology

## 2015-12-26 LAB — TYPE AND SCREEN
ABO/RH(D): A POS
Antibody Screen: NEGATIVE
UNIT DIVISION: 0
Unit division: 0

## 2015-12-27 ENCOUNTER — Ambulatory Visit (HOSPITAL_COMMUNITY): Payer: Medicare Other

## 2015-12-27 ENCOUNTER — Other Ambulatory Visit: Payer: Medicare Other

## 2015-12-27 ENCOUNTER — Telehealth: Payer: Self-pay | Admitting: Internal Medicine

## 2015-12-27 ENCOUNTER — Other Ambulatory Visit (HOSPITAL_BASED_OUTPATIENT_CLINIC_OR_DEPARTMENT_OTHER): Payer: Medicare Other

## 2015-12-27 ENCOUNTER — Ambulatory Visit: Payer: Medicare Other

## 2015-12-27 DIAGNOSIS — D469 Myelodysplastic syndrome, unspecified: Secondary | ICD-10-CM | POA: Diagnosis not present

## 2015-12-27 LAB — CBC WITH DIFFERENTIAL/PLATELET
BASO%: 3.9 % — ABNORMAL HIGH (ref 0.0–2.0)
BASOS ABS: 0.1 10*3/uL (ref 0.0–0.1)
EOS ABS: 0 10*3/uL (ref 0.0–0.5)
EOS%: 1.7 % (ref 0.0–7.0)
HCT: 28.9 % — ABNORMAL LOW (ref 38.4–49.9)
HEMOGLOBIN: 9.5 g/dL — AB (ref 13.0–17.1)
LYMPH#: 0.5 10*3/uL — AB (ref 0.9–3.3)
LYMPH%: 26.8 % (ref 14.0–49.0)
MCH: 32.5 pg (ref 27.2–33.4)
MCHC: 32.9 g/dL (ref 32.0–36.0)
MCV: 99 fL — AB (ref 79.3–98.0)
MONO#: 0.5 10*3/uL (ref 0.1–0.9)
MONO%: 27.9 % — AB (ref 0.0–14.0)
NEUT#: 0.7 10*3/uL — ABNORMAL LOW (ref 1.5–6.5)
NEUT%: 39.7 % (ref 39.0–75.0)
NRBC: 2 % — AB (ref 0–0)
PLATELETS: 112 10*3/uL — AB (ref 140–400)
RBC: 2.92 10*6/uL — ABNORMAL LOW (ref 4.20–5.82)
RDW: 22.9 % — AB (ref 11.0–14.6)
WBC: 1.8 10*3/uL — ABNORMAL LOW (ref 4.0–10.3)

## 2015-12-27 NOTE — Progress Notes (Signed)
Pt has decided to cancel all further chemotherapy and to cancel the port a cath procedure. Dr Julien Nordmann and IR notified. Pt will keep appts on Monday for labs and [facility].Onc tx request sent to cancel all  further chemo appts on 4/4 and after . He said to cancel nutrition appt. too.

## 2015-12-27 NOTE — Telephone Encounter (Signed)
Per desk nurse 3/31 pof cxl all tx and nutrition appts per pt not wanting to continue tx

## 2015-12-30 ENCOUNTER — Other Ambulatory Visit (HOSPITAL_BASED_OUTPATIENT_CLINIC_OR_DEPARTMENT_OTHER): Payer: Medicare Other

## 2015-12-30 ENCOUNTER — Ambulatory Visit: Payer: Medicare Other

## 2015-12-30 ENCOUNTER — Ambulatory Visit (HOSPITAL_BASED_OUTPATIENT_CLINIC_OR_DEPARTMENT_OTHER): Payer: Medicare Other | Admitting: Internal Medicine

## 2015-12-30 ENCOUNTER — Encounter: Payer: Self-pay | Admitting: Internal Medicine

## 2015-12-30 ENCOUNTER — Telehealth: Payer: Self-pay | Admitting: Internal Medicine

## 2015-12-30 VITALS — BP 115/52 | HR 91 | Temp 98.1°F | Resp 18 | Ht 72.0 in | Wt 166.1 lb

## 2015-12-30 DIAGNOSIS — R530 Neoplastic (malignant) related fatigue: Secondary | ICD-10-CM

## 2015-12-30 DIAGNOSIS — D61810 Antineoplastic chemotherapy induced pancytopenia: Secondary | ICD-10-CM

## 2015-12-30 DIAGNOSIS — D4620 Refractory anemia with excess of blasts, unspecified: Secondary | ICD-10-CM | POA: Diagnosis present

## 2015-12-30 DIAGNOSIS — D72819 Decreased white blood cell count, unspecified: Secondary | ICD-10-CM

## 2015-12-30 DIAGNOSIS — D539 Nutritional anemia, unspecified: Secondary | ICD-10-CM

## 2015-12-30 DIAGNOSIS — E46 Unspecified protein-calorie malnutrition: Secondary | ICD-10-CM | POA: Diagnosis not present

## 2015-12-30 DIAGNOSIS — R634 Abnormal weight loss: Secondary | ICD-10-CM | POA: Diagnosis not present

## 2015-12-30 DIAGNOSIS — D708 Other neutropenia: Secondary | ICD-10-CM

## 2015-12-30 DIAGNOSIS — D469 Myelodysplastic syndrome, unspecified: Secondary | ICD-10-CM

## 2015-12-30 LAB — COMPREHENSIVE METABOLIC PANEL
ALT: 15 U/L (ref 0–55)
ANION GAP: 6 meq/L (ref 3–11)
AST: 14 U/L (ref 5–34)
Albumin: 3.1 g/dL — ABNORMAL LOW (ref 3.5–5.0)
Alkaline Phosphatase: 55 U/L (ref 40–150)
BUN: 11.4 mg/dL (ref 7.0–26.0)
CHLORIDE: 101 meq/L (ref 98–109)
CO2: 25 meq/L (ref 22–29)
Calcium: 8.9 mg/dL (ref 8.4–10.4)
Creatinine: 0.8 mg/dL (ref 0.7–1.3)
EGFR: 83 mL/min/{1.73_m2} — AB (ref >90)
Glucose: 91 mg/dl (ref 70–140)
POTASSIUM: 4.2 meq/L (ref 3.5–5.1)
Sodium: 132 mEq/L — ABNORMAL LOW (ref 136–145)
Total Bilirubin: 1.02 mg/dL (ref 0.20–1.20)
Total Protein: 6.9 g/dL (ref 6.4–8.3)

## 2015-12-30 LAB — CBC WITH DIFFERENTIAL/PLATELET
BASO%: 2.8 % — AB (ref 0.0–2.0)
Basophils Absolute: 0.1 10*3/uL (ref 0.0–0.1)
EOS%: 0.9 % (ref 0.0–7.0)
Eosinophils Absolute: 0 10*3/uL (ref 0.0–0.5)
HCT: 27.5 % — ABNORMAL LOW (ref 38.4–49.9)
HGB: 9.1 g/dL — ABNORMAL LOW (ref 13.0–17.1)
LYMPH#: 0.6 10*3/uL — AB (ref 0.9–3.3)
LYMPH%: 26.6 % (ref 14.0–49.0)
MCH: 32.9 pg (ref 27.2–33.4)
MCHC: 33.1 g/dL (ref 32.0–36.0)
MCV: 99.3 fL — AB (ref 79.3–98.0)
MONO#: 0.5 10*3/uL (ref 0.1–0.9)
MONO%: 25.2 % — ABNORMAL HIGH (ref 0.0–14.0)
NEUT%: 44.5 % (ref 39.0–75.0)
NEUTROS ABS: 1 10*3/uL — AB (ref 1.5–6.5)
PLATELETS: 113 10*3/uL — AB (ref 140–400)
RBC: 2.77 10*6/uL — AB (ref 4.20–5.82)
RDW: 22.3 % — AB (ref 11.0–14.6)
WBC: 2.1 10*3/uL — AB (ref 4.0–10.3)
nRBC: 0 % (ref 0–0)

## 2015-12-30 NOTE — Progress Notes (Signed)
[facility] [phone]   Fax:(336) [address], [address] [place] [address]  DIAGNOSIS: 1)  myelodysplastic syndrome consistent with refractory anemia with excess blast diagnosed in December 2016 2)  History of ITP  PRIOR THERAPY: Vidaza 75 mg/m2 IV for 5 days every 4 weeks. Status post 3 cycles..  CURRENT THERAPY:  1) Supportive care with Aranesp 300 g subcutaneously every 3 weeks in addition to Granix 300 mcg subcutaneously for neutropenia as needed.  INTERVAL HISTORY: Dean Stanley 82 y.o. male returns to the clinic today for follow-up visit accompanied by his wife. The patient continues to complain of fatigue. He was concerned about the multiple sticks he had with his chemotherapy and requested a Port-A-Cath placement. This was a scheduled to be performed last week but the patient declined at the last minute to proceed with the Port-A-Cath placement and decided not to take any further chemotherapy. He tolerated the third cycle of his treatment fairly well with no significant adverse effects except for mild nausea. He continues to have pancytopenia but no fever or chills. He also complains of lack of appetite and weight loss. He has no bleeding issues. He denied having any significant chest pain, cough or hemoptysis. He is here today for evaluation and discussion of future treatment plan.  MEDICAL HISTORY: Past Medical History  Diagnosis Date  . Meniere's syndrome 1980s  . History of ITP 1960  . Anemia   . Complication of anesthesia     Reaction to anesthesia " things went haywire around me"  . Hypothyroidism   . Hearing impairment     wears hearing left ear  . Cancer (HCC)     myelodysplastic syndrome  . Do not resuscitate 11/04/2015  . Port catheter in place 12/23/2015    ALLERGIES:  is allergic to hydrocodone and oxycodone.  MEDICATIONS:  Current Outpatient Prescriptions    Medication Sig Dispense Refill  . desmopressin (DDAVP) 0.2 MG tablet Take 0.2 mg by mouth daily.    Marland Kitchen dronabinol (MARINOL) 2.5 MG capsule Take 1 capsule (2.5 mg total) by mouth 2 (two) times daily before a meal. 30 capsule 0  . famotidine (PEPCID) 20 MG tablet Take 20 mg by mouth 2 (two) times daily.    . ferrous sulfate 325 (65 FE) MG tablet Take 325 mg by mouth daily with breakfast.    . finasteride (PROPECIA) 1 MG tablet Take 1 mg by mouth daily.    Marland Kitchen levothyroxine (SYNTHROID, LEVOTHROID) 200 MCG tablet Take 200 mcg by mouth daily.    Marland Kitchen lidocaine (XYLOCAINE) 2 % jelly 1 application as needed (for rectal area as needed).    Marland Kitchen lidocaine-prilocaine (EMLA) cream Apply 1 application topically as needed. 30 g 0  . LORazepam (ATIVAN) 0.5 MG tablet Take 1 tablet (0.5 mg total) by mouth every 8 (eight) hours. 30 tablet 0  . Lutein 40 MG CAPS Take 40 mg by mouth daily.    . Multiple Vitamins-Minerals (CENTRUM ADULTS PO) Take 1 tablet by mouth daily.     Marland Kitchen nystatin-triamcinolone ointment (MYCOLOG) Apply 1 application topically 2 (two) times daily. 30 g 0  . polyethylene glycol (MIRALAX / GLYCOLAX) packet Take 17 g by mouth daily as needed for mild constipation.    . prochlorperazine (COMPAZINE) 10 MG tablet Take 1 tablet (10 mg total) by mouth every 8 (eight) hours as needed for nausea or vomiting. 30 tablet 0  .  senna-docusate (SENNA S) 8.6-50 MG tablet Take 2 tablets by mouth every 6 (six) hours as needed for mild constipation.    . diphenhydrAMINE (BENADRYL) 25 MG tablet Take 25 mg by mouth every 6 (six) hours as needed for itching. Reported on 12/30/2015     No current facility-administered medications for this visit.   Facility-Administered Medications Ordered in Other Visits  Medication Dose Route Frequency Provider Last Rate Last Dose  . azaCITIDine (VIDAZA) 150 mg in lactated ringers 45 mL chemo infusion  75 mg/m2 (Treatment Plan Actual) Intravenous Daily Curt Bears, MD   150 mg at  12/02/15 1428  . heparin lock flush 100 unit/mL  500 Units Intracatheter Once PRN Curt Bears, MD      . sodium chloride 0.9 % injection 10 mL  10 mL Intracatheter PRN Curt Bears, MD        SURGICAL HISTORY:  Past Surgical History  Procedure Laterality Date  . Colonoscopy    . Cortisone injections to back x 6    . Circumcision    . Cataract Right 2011  . Orif orbital fracture Left 2000    2 fractures orbital platform    REVIEW OF SYSTEMS:  Constitutional: positive for anorexia, fatigue and weight loss Eyes: negative Ears, nose, mouth, throat, and face: negative Respiratory: positive for dyspnea on exertion Cardiovascular: negative Gastrointestinal: negative Genitourinary:negative Integument/breast: negative Hematologic/lymphatic: negative Musculoskeletal:positive for muscle weakness Neurological: negative Behavioral/Psych: negative Endocrine: negative Allergic/Immunologic: negative   PHYSICAL EXAMINATION: General appearance: alert, cooperative, fatigued and no distress Head: Normocephalic, without obvious abnormality, atraumatic Neck: no adenopathy, no JVD, supple, symmetrical, trachea midline and thyroid not enlarged, symmetric, no tenderness/mass/nodules Lymph nodes: Cervical, supraclavicular, and axillary nodes normal. Resp: clear to auscultation bilaterally Back: symmetric, no curvature. ROM normal. No CVA tenderness. Cardio: regular rate and rhythm, S1, S2 normal, no murmur, click, rub or gallop GI: soft, non-tender; bowel sounds normal; no masses,  no organomegaly Extremities: extremities normal, atraumatic, no cyanosis or edema Neurologic: Alert and oriented X 3, normal strength and tone. Normal symmetric reflexes. Normal coordination and gait  ECOG PERFORMANCE STATUS: 1 - Symptomatic but completely ambulatory  Blood pressure 115/52, pulse 91, temperature 98.1 F (36.7 C), temperature source Oral, resp. rate 18, height 6' (1.829 m), weight 166 lb 1.6 oz  (75.342 kg), SpO2 96 %.  LABORATORY DATA: Lab Results  Component Value Date   WBC 2.1* 12/30/2015   HGB 9.1* 12/30/2015   HCT 27.5* 12/30/2015   MCV 99.3* 12/30/2015   PLT 113* 12/30/2015      Chemistry      Component Value Date/Time   NA 132* 12/30/2015 1141   NA 141 08/25/2006 1353   K 4.2 12/30/2015 1141   K 4.4 08/25/2006 1353   CL 104 08/25/2006 1353   CO2 25 12/30/2015 1141   CO2 29 08/25/2006 1353   BUN 11.4 12/30/2015 1141   BUN 14 08/25/2006 1353   CREATININE 0.8 12/30/2015 1141   CREATININE 0.90 08/25/2006 1353      Component Value Date/Time   CALCIUM 8.9 12/30/2015 1141   CALCIUM 9.6 08/25/2006 1353   ALKPHOS 55 12/30/2015 1141   ALKPHOS 45 08/25/2006 1353   AST 14 12/30/2015 1141   AST 18 08/25/2006 1353   ALT 15 12/30/2015 1141   ALT 17 08/25/2006 1353   BILITOT 1.02 12/30/2015 1141   BILITOT 1.2 08/25/2006 1353       RADIOGRAPHIC STUDIES: No results found.  ASSESSMENT AND PLAN:  This is a very  pleasant 82 years old white male with recently diagnosed myelodysplastic syndrome consistent with refractory anemia with excess blast.  He is currently undergoing systemic chemotherapy with Vidaza 75 MG/M2 for 5 days every 4 weeks is status post 3 cycles. He tolerated the third cycle of his treatment fairly well except for the persistent fatigue and pancytopenia secondary to his disease.  The patient and his wife made decision recently not to continue with any further treatment with Vidaza. I had a lengthy discussion with him about his other treatment options including continuous observation and supportive care. He may require palliative care and hospice at some point in the future. For the refractory anemia, I would consider the patient for treatment with Aranesp 300 g subcutaneously every 3 weeks. For the lack of appetite and weight loss, he will continue on Marinol 2.5 mg by mouth twice a day and I referred him to the dietitian at the [facility] for  evaluation of his nutritional status. The patient would come back for follow-up visit in 6 weeks for reevaluation with repeat blood work. He was advised to call immediately if he has any significant fever or chills or any other signs of infection during this period. The patient voices understanding of current disease status and treatment options and is in agreement with the current care plan.  All questions were answered. The patient knows to call the clinic with any problems, questions or concerns. We can certainly see the patient much sooner if necessary.  Disclaimer: This note was dictated with voice recognition software. Similar sounding words can inadvertently be transcribed and may not be corrected upon review.

## 2015-12-30 NOTE — Telephone Encounter (Signed)
Gave and printed appt shced and avs for pt for April and May °

## 2015-12-31 ENCOUNTER — Encounter: Payer: Medicare Other | Admitting: Nutrition

## 2015-12-31 ENCOUNTER — Ambulatory Visit: Payer: Medicare Other

## 2016-01-01 ENCOUNTER — Encounter: Payer: Medicare Other | Admitting: Nutrition

## 2016-01-01 ENCOUNTER — Telehealth: Payer: Self-pay | Admitting: Medical Oncology

## 2016-01-01 ENCOUNTER — Ambulatory Visit: Payer: Medicare Other

## 2016-01-01 NOTE — Telephone Encounter (Signed)
Filed insurance auth letter re EMLA

## 2016-01-02 ENCOUNTER — Ambulatory Visit: Payer: Medicare Other

## 2016-01-03 ENCOUNTER — Ambulatory Visit: Payer: Medicare Other

## 2016-01-04 ENCOUNTER — Ambulatory Visit: Payer: Medicare Other

## 2016-01-06 ENCOUNTER — Ambulatory Visit: Payer: Medicare Other

## 2016-01-06 ENCOUNTER — Ambulatory Visit (HOSPITAL_BASED_OUTPATIENT_CLINIC_OR_DEPARTMENT_OTHER): Payer: Medicare Other

## 2016-01-06 ENCOUNTER — Other Ambulatory Visit (HOSPITAL_BASED_OUTPATIENT_CLINIC_OR_DEPARTMENT_OTHER): Payer: Medicare Other

## 2016-01-06 VITALS — BP 104/91 | HR 91 | Temp 98.6°F

## 2016-01-06 DIAGNOSIS — D469 Myelodysplastic syndrome, unspecified: Secondary | ICD-10-CM

## 2016-01-06 DIAGNOSIS — D539 Nutritional anemia, unspecified: Secondary | ICD-10-CM | POA: Diagnosis not present

## 2016-01-06 DIAGNOSIS — D4620 Refractory anemia with excess of blasts, unspecified: Secondary | ICD-10-CM

## 2016-01-06 LAB — COMPREHENSIVE METABOLIC PANEL
AST: 10 U/L (ref 5–34)
Albumin: 3 g/dL — ABNORMAL LOW (ref 3.5–5.0)
Alkaline Phosphatase: 52 U/L (ref 40–150)
Anion Gap: 7 mEq/L (ref 3–11)
BUN: 12.4 mg/dL (ref 7.0–26.0)
CALCIUM: 8.6 mg/dL (ref 8.4–10.4)
CHLORIDE: 108 meq/L (ref 98–109)
CO2: 24 meq/L (ref 22–29)
CREATININE: 0.9 mg/dL (ref 0.7–1.3)
EGFR: 81 mL/min/{1.73_m2} — ABNORMAL LOW (ref >90)
GLUCOSE: 110 mg/dL (ref 70–140)
POTASSIUM: 4.2 meq/L (ref 3.5–5.1)
SODIUM: 139 meq/L (ref 136–145)
Total Bilirubin: 0.91 mg/dL (ref 0.20–1.20)
Total Protein: 7 g/dL (ref 6.4–8.3)

## 2016-01-06 LAB — CBC WITH DIFFERENTIAL/PLATELET
BASO%: 4.1 % — ABNORMAL HIGH (ref 0.0–2.0)
BASOS ABS: 0.1 10*3/uL (ref 0.0–0.1)
EOS ABS: 0 10*3/uL (ref 0.0–0.5)
EOS%: 0.9 % (ref 0.0–7.0)
HCT: 26.3 % — ABNORMAL LOW (ref 38.4–49.9)
HEMOGLOBIN: 8.3 g/dL — AB (ref 13.0–17.1)
LYMPH#: 0.5 10*3/uL — AB (ref 0.9–3.3)
LYMPH%: 23.5 % (ref 14.0–49.0)
MCH: 32.5 pg (ref 27.2–33.4)
MCHC: 31.6 g/dL — AB (ref 32.0–36.0)
MCV: 103.1 fL — ABNORMAL HIGH (ref 79.3–98.0)
MONO#: 0.3 10*3/uL (ref 0.1–0.9)
MONO%: 13.1 % (ref 0.0–14.0)
NEUT#: 1.3 10*3/uL — ABNORMAL LOW (ref 1.5–6.5)
NEUT%: 58.4 % (ref 39.0–75.0)
NRBC: 1 % — AB (ref 0–0)
PLATELETS: 130 10*3/uL — AB (ref 140–400)
RBC: 2.55 10*6/uL — AB (ref 4.20–5.82)
RDW: 23 % — AB (ref 11.0–14.6)
WBC: 2.2 10*3/uL — ABNORMAL LOW (ref 4.0–10.3)

## 2016-01-06 MED ORDER — DARBEPOETIN ALFA 300 MCG/0.6ML IJ SOSY
300.0000 ug | PREFILLED_SYRINGE | Freq: Once | INTRAMUSCULAR | Status: AC
  Administered 2016-01-06: 300 ug via SUBCUTANEOUS
  Filled 2016-01-06: qty 0.6

## 2016-01-06 NOTE — Progress Notes (Signed)
Mr Laveck here for Aranesp injection.  HGB 8.3. Down from 9.1.  Wife is concerned about HGB dropping.  Patient isn't having any SOB with activity or extreme weakness.  They are concerned that it will drop low before next appointment in 3 weeks.  Discussed with Dr Julien Nordmann and have ordered a repeat CBC in 2 weeks   Patient and wife know that if he develops any problems before then to call.  He also is being treated for esophagitis by his PCP

## 2016-01-08 ENCOUNTER — Ambulatory Visit
Admission: RE | Admit: 2016-01-08 | Discharge: 2016-01-08 | Disposition: A | Discharge status: Home / Self Care | Payer: Medicare Other | Source: Ambulatory Visit | Attending: Internal Medicine | Admitting: Internal Medicine

## 2016-01-08 ENCOUNTER — Other Ambulatory Visit: Payer: Self-pay | Admitting: Internal Medicine

## 2016-01-08 DIAGNOSIS — M546 Pain in thoracic spine: Secondary | ICD-10-CM

## 2016-01-20 ENCOUNTER — Ambulatory Visit (HOSPITAL_BASED_OUTPATIENT_CLINIC_OR_DEPARTMENT_OTHER): Payer: Medicare Other

## 2016-01-20 ENCOUNTER — Other Ambulatory Visit (HOSPITAL_BASED_OUTPATIENT_CLINIC_OR_DEPARTMENT_OTHER): Payer: Medicare Other

## 2016-01-20 ENCOUNTER — Ambulatory Visit: Payer: Medicare Other

## 2016-01-20 ENCOUNTER — Ambulatory Visit (HOSPITAL_COMMUNITY)
Admission: RE | Admit: 2016-01-20 | Discharge: 2016-01-20 | Disposition: A | Discharge status: Home / Self Care | Payer: Medicare Other | Source: Ambulatory Visit | Attending: Internal Medicine | Admitting: Internal Medicine

## 2016-01-20 ENCOUNTER — Other Ambulatory Visit: Payer: Self-pay | Admitting: Medical Oncology

## 2016-01-20 ENCOUNTER — Telehealth: Payer: Self-pay | Admitting: Medical Oncology

## 2016-01-20 VITALS — BP 105/58 | HR 64 | Temp 98.5°F | Resp 18

## 2016-01-20 DIAGNOSIS — D4620 Refractory anemia with excess of blasts, unspecified: Secondary | ICD-10-CM | POA: Diagnosis present

## 2016-01-20 DIAGNOSIS — D649 Anemia, unspecified: Secondary | ICD-10-CM

## 2016-01-20 LAB — COMPREHENSIVE METABOLIC PANEL
ALBUMIN: 3.2 g/dL — AB (ref 3.5–5.0)
ALT: <9 U/L (ref 0–55)
AST: 11 U/L (ref 5–34)
Alkaline Phosphatase: 49 U/L (ref 40–150)
Anion Gap: 7 mEq/L (ref 3–11)
BUN: 11.5 mg/dL (ref 7.0–26.0)
CHLORIDE: 103 meq/L (ref 98–109)
CO2: 24 meq/L (ref 22–29)
Calcium: 8.4 mg/dL (ref 8.4–10.4)
Creatinine: 0.7 mg/dL (ref 0.7–1.3)
EGFR: 85 mL/min/{1.73_m2} — ABNORMAL LOW (ref >90)
GLUCOSE: 90 mg/dL (ref 70–140)
POTASSIUM: 3.9 meq/L (ref 3.5–5.1)
SODIUM: 135 meq/L — AB (ref 136–145)
Total Bilirubin: 0.92 mg/dL (ref 0.20–1.20)
Total Protein: 6.7 g/dL (ref 6.4–8.3)

## 2016-01-20 LAB — CBC WITH DIFFERENTIAL/PLATELET
BASO%: 1.2 % (ref 0.0–2.0)
Basophils Absolute: 0 10*3/uL (ref 0.0–0.1)
EOS%: 0 % (ref 0.0–7.0)
Eosinophils Absolute: 0 10*3/uL (ref 0.0–0.5)
HCT: 21 % — ABNORMAL LOW (ref 38.4–49.9)
HEMOGLOBIN: 6.7 g/dL — AB (ref 13.0–17.1)
LYMPH#: 0.9 10*3/uL (ref 0.9–3.3)
LYMPH%: 34.9 % (ref 14.0–49.0)
MCH: 32.8 pg (ref 27.2–33.4)
MCHC: 31.9 g/dL — ABNORMAL LOW (ref 32.0–36.0)
MCV: 102.9 fL — ABNORMAL HIGH (ref 79.3–98.0)
MONO#: 0.6 10*3/uL (ref 0.1–0.9)
MONO%: 22 % — AB (ref 0.0–14.0)
NEUT#: 1.1 10*3/uL — ABNORMAL LOW (ref 1.5–6.5)
NEUT%: 41.9 % (ref 39.0–75.0)
Platelets: 80 10*3/uL — ABNORMAL LOW (ref 140–400)
RBC: 2.04 10*6/uL — AB (ref 4.20–5.82)
RDW: 23.9 % — AB (ref 11.0–14.6)
WBC: 2.6 10*3/uL — ABNORMAL LOW (ref 4.0–10.3)
nRBC: 2 % — ABNORMAL HIGH (ref 0–0)

## 2016-01-20 LAB — TECHNOLOGIST REVIEW

## 2016-01-20 LAB — PREPARE RBC (CROSSMATCH)

## 2016-01-20 MED ORDER — ACETAMINOPHEN 325 MG PO TABS
ORAL_TABLET | ORAL | Status: AC
  Filled 2016-01-20: qty 2

## 2016-01-20 MED ORDER — DIPHENHYDRAMINE HCL 25 MG PO CAPS
ORAL_CAPSULE | ORAL | Status: AC
  Filled 2016-01-20: qty 1

## 2016-01-20 MED ORDER — SODIUM CHLORIDE 0.9 % IV SOLN
250.0000 mL | Freq: Once | INTRAVENOUS | Status: AC
  Administered 2016-01-20: 250 mL via INTRAVENOUS

## 2016-01-20 MED ORDER — DIPHENHYDRAMINE HCL 25 MG PO CAPS
25.0000 mg | ORAL_CAPSULE | Freq: Once | ORAL | Status: AC
  Administered 2016-01-20: 25 mg via ORAL

## 2016-01-20 MED ORDER — ACETAMINOPHEN 325 MG PO TABS
650.0000 mg | ORAL_TABLET | Freq: Once | ORAL | Status: AC
  Administered 2016-01-20: 650 mg via ORAL

## 2016-01-20 NOTE — Progress Notes (Signed)
HAR done

## 2016-01-20 NOTE — Telephone Encounter (Signed)
Pt coming back today for type and cross match and 2 units of blood.

## 2016-01-20 NOTE — Progress Notes (Signed)
Quick Note:  Call patient with the result and arrange for 2 units of PRBCs today. ______

## 2016-01-20 NOTE — Patient Instructions (Signed)
Blood Transfusion   A blood transfusion is a procedure that gives you donated blood through an IV tube. You may need blood because of illness, surgery, or injury. The blood may come from a donor. The blood may also be your own blood that you donated earlier.  The blood you get is made up of different types of cells. You may get:    Red blood cells. These carry oxygen and replace lost blood.    Platelets. These control bleeding.    Plasma. This helps blood to clot.  If you have a clotting disorder, you may also get other types of blood products.   BEFORE THE PROCEDURE   You may have a blood test. This finds out what type of blood you have. It also finds out what kind of blood your body will accept.    If you are going to have a planned surgery, you may donate your own blood. This is done in case you need to have a transfusion.    If you have had an allergic transfusion reaction before, you may be given medicine to help prevent a reaction. Take this medicine only as told by your doctor.   You will have your temperature, blood pressure, and pulse checked.  PROCEDURE    An IV will be started in your hand or arm.    The bag of donated blood will be attached to your IV and run into your vein.    A doctor will regularly check your temperature, blood pressure, and pulse during the procedure. This is done to find any early signs of a transfusion reaction.   If you have any signs or symptoms of a reaction, the procedure may be stopped and you may be given medicine.    When the transfusion is over, your IV will be removed.    Pressure may be applied to the IV site for a few minutes.    A bandage (dressing) will be applied.   The procedure may vary among doctors and hospitals.   AFTER THE PROCEDURE   Your blood pressure, temperature, and pulse will be checked regularly.     This information is not intended to replace advice given to you by your health care provider. Make sure you discuss any questions  you have with your health care provider.     Document Released: 12/11/2008 Document Revised: 10/05/2014 Document Reviewed: 07/25/2014  [employer]vier Interactive Patient Education 2016 [employer]

## 2016-01-21 LAB — TYPE AND SCREEN
ABO/RH(D): A POS
Antibody Screen: NEGATIVE
UNIT DIVISION: 0
UNIT DIVISION: 0

## 2016-01-27 ENCOUNTER — Ambulatory Visit (HOSPITAL_BASED_OUTPATIENT_CLINIC_OR_DEPARTMENT_OTHER): Payer: Medicare Other

## 2016-01-27 ENCOUNTER — Other Ambulatory Visit (HOSPITAL_BASED_OUTPATIENT_CLINIC_OR_DEPARTMENT_OTHER): Payer: Medicare Other

## 2016-01-27 VITALS — BP 113/47 | HR 79 | Temp 98.3°F | Resp 20

## 2016-01-27 DIAGNOSIS — D4620 Refractory anemia with excess of blasts, unspecified: Secondary | ICD-10-CM | POA: Diagnosis present

## 2016-01-27 DIAGNOSIS — D469 Myelodysplastic syndrome, unspecified: Secondary | ICD-10-CM

## 2016-01-27 DIAGNOSIS — D539 Nutritional anemia, unspecified: Secondary | ICD-10-CM

## 2016-01-27 LAB — CBC WITH DIFFERENTIAL/PLATELET
BASO%: 0.9 % (ref 0.0–2.0)
Basophils Absolute: 0 10*3/uL (ref 0.0–0.1)
EOS ABS: 0 10*3/uL (ref 0.0–0.5)
EOS%: 0 % (ref 0.0–7.0)
HEMATOCRIT: 26.7 % — AB (ref 38.4–49.9)
HEMOGLOBIN: 8.6 g/dL — AB (ref 13.0–17.1)
LYMPH%: 22 % (ref 14.0–49.0)
MCH: 32.3 pg (ref 27.2–33.4)
MCHC: 32.2 g/dL (ref 32.0–36.0)
MCV: 100.4 fL — ABNORMAL HIGH (ref 79.3–98.0)
MONO#: 0.4 10*3/uL (ref 0.1–0.9)
MONO%: 18.8 % — ABNORMAL HIGH (ref 0.0–14.0)
NEUT%: 58.3 % (ref 39.0–75.0)
NEUTROS ABS: 1.3 10*3/uL — AB (ref 1.5–6.5)
PLATELETS: 69 10*3/uL — AB (ref 140–400)
RBC: 2.66 10*6/uL — ABNORMAL LOW (ref 4.20–5.82)
RDW: 22.6 % — ABNORMAL HIGH (ref 11.0–14.6)
WBC: 2.2 10*3/uL — AB (ref 4.0–10.3)
lymph#: 0.5 10*3/uL — ABNORMAL LOW (ref 0.9–3.3)
nRBC: 1 % — ABNORMAL HIGH (ref 0–0)

## 2016-01-27 LAB — COMPREHENSIVE METABOLIC PANEL
ALBUMIN: 3.5 g/dL (ref 3.5–5.0)
ALK PHOS: 48 U/L (ref 40–150)
ALT: 9 U/L (ref 0–55)
ANION GAP: 6 meq/L (ref 3–11)
AST: 11 U/L (ref 5–34)
BUN: 14.6 mg/dL (ref 7.0–26.0)
CALCIUM: 8.7 mg/dL (ref 8.4–10.4)
CHLORIDE: 104 meq/L (ref 98–109)
CO2: 26 mEq/L (ref 22–29)
CREATININE: 0.8 mg/dL (ref 0.7–1.3)
EGFR: 83 mL/min/{1.73_m2} — ABNORMAL LOW (ref >90)
Glucose: 102 mg/dl (ref 70–140)
Potassium: 4.2 mEq/L (ref 3.5–5.1)
Sodium: 136 mEq/L (ref 136–145)
Total Bilirubin: 1.09 mg/dL (ref 0.20–1.20)
Total Protein: 7.1 g/dL (ref 6.4–8.3)

## 2016-01-27 LAB — TECHNOLOGIST REVIEW

## 2016-01-27 MED ORDER — DARBEPOETIN ALFA 300 MCG/0.6ML IJ SOSY
300.0000 ug | PREFILLED_SYRINGE | Freq: Once | INTRAMUSCULAR | Status: AC
  Administered 2016-01-27: 300 ug via SUBCUTANEOUS
  Filled 2016-01-27: qty 0.6

## 2016-01-27 MED ORDER — PEGFILGRASTIM INJECTION 6 MG/0.6ML ~~LOC~~
6.0000 mg | PREFILLED_SYRINGE | Freq: Once | SUBCUTANEOUS | Status: AC
  Administered 2016-01-27: 6 mg via SUBCUTANEOUS
  Filled 2016-01-27: qty 0.6

## 2016-01-27 NOTE — Patient Instructions (Signed)
Pegfilgrastim injection What is this medicine? PEGFILGRASTIM (PEG fil gra stim) is a long-acting granulocyte colony-stimulating factor that stimulates the growth of neutrophils, a type of white blood cell important in the body's fight against infection. It is used to reduce the incidence of fever and infection in patients with certain types of cancer who are receiving chemotherapy that affects the bone marrow, and to increase survival after being exposed to high doses of radiation. This medicine may be used for other purposes; ask your health care provider or pharmacist if you have questions. What should I tell my health care provider before I take this medicine? They need to know if you have any of these conditions: -kidney disease -latex allergy -ongoing radiation therapy -sickle cell disease -skin reactions to acrylic adhesives (On-Body Injector only) -an unusual or allergic reaction to pegfilgrastim, filgrastim, other medicines, foods, dyes, or preservatives -pregnant or trying to get pregnant -breast-feeding How should I use this medicine? This medicine is for injection under the skin. If you get this medicine at home, you will be taught how to prepare and give the pre-filled syringe or how to use the On-body Injector. Refer to the patient Instructions for Use for detailed instructions. Use exactly as directed. Take your medicine at regular intervals. Do not take your medicine more often than directed. It is important that you put your used needles and syringes in a special sharps container. Do not put them in a trash can. If you do not have a sharps container, call your pharmacist or healthcare provider to get one. Talk to your pediatrician regarding the use of this medicine in children. While this drug may be prescribed for selected conditions, precautions do apply. Overdosage: If you think you have taken too much of this medicine contact a poison control center or emergency room at  once. NOTE: This medicine is only for you. Do not share this medicine with others. What if I miss a dose? It is important not to miss your dose. Call your doctor or health care professional if you miss your dose. If you miss a dose due to an On-body Injector failure or leakage, a new dose should be administered as soon as possible using a single prefilled syringe for manual use. What may interact with this medicine? Interactions have not been studied. Give your health care provider a list of all the medicines, herbs, non-prescription drugs, or dietary supplements you use. Also tell them if you smoke, drink alcohol, or use illegal drugs. Some items may interact with your medicine. This list may not describe all possible interactions. Give your health care provider a list of all the medicines, herbs, non-prescription drugs, or dietary supplements you use. Also tell them if you smoke, drink alcohol, or use illegal drugs. Some items may interact with your medicine. What should I watch for while using this medicine? You may need blood work done while you are taking this medicine. If you are going to need a MRI, CT scan, or other procedure, tell your doctor that you are using this medicine (On-Body Injector only). What side effects may I notice from receiving this medicine? Side effects that you should report to your doctor or health care professional as soon as possible: -allergic reactions like skin rash, itching or hives, swelling of the face, lips, or tongue -dizziness -fever -pain, redness, or irritation at site where injected -pinpoint red spots on the skin -red or dark-brown urine -shortness of breath or breathing problems -stomach or side pain, or pain   at the shoulder -swelling -tiredness -trouble passing urine or change in the amount of urine Side effects that usually do not require medical attention (report to your doctor or health care professional if they continue or are  bothersome): -bone pain -muscle pain This list may not describe all possible side effects. Call your doctor for medical advice about side effects. You may report side effects to FDA at 1-800-FDA-1088. Where should I keep my medicine? Keep out of the reach of children. Store pre-filled syringes in a refrigerator between 2 and 8 degrees C (36 and 46 degrees F). Do not freeze. Keep in carton to protect from light. Throw away this medicine if it is left out of the refrigerator for more than 48 hours. Throw away any unused medicine after the expiration date. NOTE: This sheet is a summary. It may not cover all possible information. If you have questions about this medicine, talk to your doctor, pharmacist, or health care provider.    2016, Elsevier/Gold Standard. (2014-10-04 14:30:14) Darbepoetin Alfa injection What is this medicine? DARBEPOETIN ALFA (dar be POE e tin AL fa) helps your body make more red blood cells. It is used to treat anemia caused by chronic kidney failure and chemotherapy. This medicine may be used for other purposes; ask your health care provider or pharmacist if you have questions. What should I tell my health care provider before I take this medicine? They need to know if you have any of these conditions: -blood clotting disorders or history of blood clots -cancer patient not on chemotherapy -cystic fibrosis -heart disease, such as angina, heart failure, or a history of a heart attack -hemoglobin level of 12 g/dL or greater -high blood pressure -low levels of folate, iron, or vitamin B12 -seizures -an unusual or allergic reaction to darbepoetin, erythropoietin, albumin, hamster proteins, latex, other medicines, foods, dyes, or preservatives -pregnant or trying to get pregnant -breast-feeding How should I use this medicine? This medicine is for injection into a vein or under the skin. It is usually given by a health care professional in a hospital or clinic setting. If  you get this medicine at home, you will be taught how to prepare and give this medicine. Do not shake the solution before you withdraw a dose. Use exactly as directed. Take your medicine at regular intervals. Do not take your medicine more often than directed. It is important that you put your used needles and syringes in a special sharps container. Do not put them in a trash can. If you do not have a sharps container, call your pharmacist or healthcare provider to get one. Talk to your pediatrician regarding the use of this medicine in children. While this medicine may be used in children as young as 1 year for selected conditions, precautions do apply. Overdosage: If you think you have taken too much of this medicine contact a poison control center or emergency room at once. NOTE: This medicine is only for you. Do not share this medicine with others. What if I miss a dose? If you miss a dose, take it as soon as you can. If it is almost time for your next dose, take only that dose. Do not take double or extra doses. What may interact with this medicine? Do not take this medicine with any of the following medications: -epoetin alfa This list may not describe all possible interactions. Give your health care provider a list of all the medicines, herbs, non-prescription drugs, or dietary supplements you use. Also  tell them if you smoke, drink alcohol, or use illegal drugs. Some items may interact with your medicine. What should I watch for while using this medicine? Visit your prescriber or health care professional for regular checks on your progress and for the needed blood tests and blood pressure measurements. It is especially important for the doctor to make sure your hemoglobin level is in the desired range, to limit the risk of potential side effects and to give you the best benefit. Keep all appointments for any recommended tests. Check your blood pressure as directed. Ask your doctor what your blood  pressure should be and when you should contact him or her. As your body makes more red blood cells, you may need to take iron, folic acid, or vitamin B supplements. Ask your doctor or health care provider which products are right for you. If you have kidney disease continue dietary restrictions, even though this medication can make you feel better. Talk with your doctor or health care professional about the foods you eat and the vitamins that you take. What side effects may I notice from receiving this medicine? Side effects that you should report to your doctor or health care professional as soon as possible: -allergic reactions like skin rash, itching or hives, swelling of the face, lips, or tongue -breathing problems -changes in vision -chest pain -confusion, trouble speaking or understanding -feeling faint or lightheaded, falls -high blood pressure -muscle aches or pains -pain, swelling, warmth in the leg -rapid weight gain -severe headaches -sudden numbness or weakness of the face, arm or leg -trouble walking, dizziness, loss of balance or coordination -seizures (convulsions) -swelling of the ankles, feet, hands -unusually weak or tired Side effects that usually do not require medical attention (report to your doctor or health care professional if they continue or are bothersome): -diarrhea -fever, chills (flu-like symptoms) -headaches -nausea, vomiting -redness, stinging, or swelling at site where injected This list may not describe all possible side effects. Call your doctor for medical advice about side effects. You may report side effects to FDA at 1-800-FDA-1088. Where should I keep my medicine? Keep out of the reach of children. Store in a refrigerator between 2 and 8 degrees C (36 and 46 degrees F). Do not freeze. Do not shake. Throw away any unused portion if using a single-dose vial. Throw away any unused medicine after the expiration date. NOTE: This sheet is a summary.  It may not cover all possible information. If you have questions about this medicine, talk to your doctor, pharmacist, or health care provider.    2016, Elsevier/Gold Standard. (2008-08-28 10:23:57)

## 2016-01-30 ENCOUNTER — Encounter: Payer: Self-pay | Admitting: Internal Medicine

## 2016-01-30 NOTE — Progress Notes (Signed)
refaxed [employer] prior auth req for lidocaine cream [id number]

## 2016-02-03 ENCOUNTER — Telehealth: Payer: Self-pay | Admitting: *Deleted

## 2016-02-03 ENCOUNTER — Other Ambulatory Visit: Payer: Self-pay | Admitting: Internal Medicine

## 2016-02-03 NOTE — Telephone Encounter (Signed)
Pt wife Jeronimo Norma called, with the following concerns. Pt given Cipro for UTI, Clindimycin for abcess tooth- are they compatible? Pt's pain is not relieved by tylenol, what else can he take. Reviewed concerns with MD, who advised pt or wife to call Dentist to verify if pt can take both meds at same time as he did not prescribe either medicine. Pt can take Tramadol 50mg  as prescribe by PCP for pain. Pamala Hurry verbalized understanding no further concerns.

## 2016-02-03 NOTE — Telephone Encounter (Signed)
Call received from patient's "spouse Dean Stanley.  I would like to speak with One of Dr. Worthy Flank nurses or someone who is familiar with infections my husband Dean Stanley is trying to fight off." Call transferred to collaborative nurse at ext [phone] per her request.

## 2016-02-04 ENCOUNTER — Other Ambulatory Visit: Payer: Self-pay | Admitting: Medical Oncology

## 2016-02-04 DIAGNOSIS — D469 Myelodysplastic syndrome, unspecified: Secondary | ICD-10-CM

## 2016-02-10 ENCOUNTER — Telehealth: Payer: Self-pay | Admitting: *Deleted

## 2016-02-10 ENCOUNTER — Telehealth: Payer: Self-pay | Admitting: Internal Medicine

## 2016-02-10 ENCOUNTER — Ambulatory Visit (HOSPITAL_BASED_OUTPATIENT_CLINIC_OR_DEPARTMENT_OTHER): Payer: Medicare Other

## 2016-02-10 ENCOUNTER — Other Ambulatory Visit (HOSPITAL_BASED_OUTPATIENT_CLINIC_OR_DEPARTMENT_OTHER): Payer: Medicare Other

## 2016-02-10 ENCOUNTER — Other Ambulatory Visit: Payer: Self-pay | Admitting: Medical Oncology

## 2016-02-10 ENCOUNTER — Other Ambulatory Visit: Payer: Self-pay | Admitting: *Deleted

## 2016-02-10 ENCOUNTER — Ambulatory Visit (HOSPITAL_COMMUNITY)
Admission: RE | Admit: 2016-02-10 | Discharge: 2016-02-10 | Disposition: A | Discharge status: Home / Self Care | Payer: Medicare Other | Source: Ambulatory Visit | Attending: Internal Medicine | Admitting: Internal Medicine

## 2016-02-10 ENCOUNTER — Other Ambulatory Visit: Payer: Medicare Other

## 2016-02-10 VITALS — BP 109/66 | HR 77 | Temp 98.3°F | Resp 18

## 2016-02-10 DIAGNOSIS — D649 Anemia, unspecified: Secondary | ICD-10-CM

## 2016-02-10 DIAGNOSIS — D469 Myelodysplastic syndrome, unspecified: Secondary | ICD-10-CM

## 2016-02-10 LAB — CBC WITH DIFFERENTIAL/PLATELET
BASO%: 0.6 % (ref 0.0–2.0)
Basophils Absolute: 0 10*3/uL (ref 0.0–0.1)
EOS%: 0.6 % (ref 0.0–7.0)
Eosinophils Absolute: 0 10*3/uL (ref 0.0–0.5)
HCT: 22.8 % — ABNORMAL LOW (ref 38.4–49.9)
HGB: 7.4 g/dL — ABNORMAL LOW (ref 13.0–17.1)
LYMPH#: 0.8 10*3/uL — AB (ref 0.9–3.3)
LYMPH%: 47.5 % (ref 14.0–49.0)
MCH: 32.2 pg (ref 27.2–33.4)
MCHC: 32.5 g/dL (ref 32.0–36.0)
MCV: 99.1 fL — ABNORMAL HIGH (ref 79.3–98.0)
MONO#: 0.3 10*3/uL (ref 0.1–0.9)
MONO%: 17.1 % — AB (ref 0.0–14.0)
NEUT#: 0.5 10*3/uL — CL (ref 1.5–6.5)
NEUT%: 34.2 % — AB (ref 39.0–75.0)
Platelets: 42 10*3/uL — ABNORMAL LOW (ref 140–400)
RBC: 2.3 10*6/uL — AB (ref 4.20–5.82)
RDW: 24 % — ABNORMAL HIGH (ref 11.0–14.6)
WBC: 1.6 10*3/uL — ABNORMAL LOW (ref 4.0–10.3)
nRBC: 4 % — ABNORMAL HIGH (ref 0–0)

## 2016-02-10 LAB — PREPARE RBC (CROSSMATCH)

## 2016-02-10 MED ORDER — ACETAMINOPHEN 325 MG PO TABS
650.0000 mg | ORAL_TABLET | Freq: Once | ORAL | Status: AC
  Administered 2016-02-10: 650 mg via ORAL

## 2016-02-10 MED ORDER — SODIUM CHLORIDE 0.9 % IV SOLN
250.0000 mL | Freq: Once | INTRAVENOUS | Status: AC
  Administered 2016-02-10: 250 mL via INTRAVENOUS

## 2016-02-10 MED ORDER — DIPHENHYDRAMINE HCL 25 MG PO CAPS
ORAL_CAPSULE | ORAL | Status: AC
  Filled 2016-02-10: qty 1

## 2016-02-10 MED ORDER — ACETAMINOPHEN 325 MG PO TABS
ORAL_TABLET | ORAL | Status: AC
  Filled 2016-02-10: qty 2

## 2016-02-10 MED ORDER — TBO-FILGRASTIM 300 MCG/0.5ML ~~LOC~~ SOSY
300.0000 ug | PREFILLED_SYRINGE | Freq: Once | SUBCUTANEOUS | Status: AC
  Administered 2016-02-10: 300 ug via SUBCUTANEOUS
  Filled 2016-02-10: qty 0.5

## 2016-02-10 MED ORDER — DIPHENHYDRAMINE HCL 25 MG PO CAPS
25.0000 mg | ORAL_CAPSULE | Freq: Once | ORAL | Status: AC
  Administered 2016-02-10: 25 mg via ORAL

## 2016-02-10 MED ORDER — TBO-FILGRASTIM 300 MCG/0.5ML ~~LOC~~ SOSY: 300.0000 ug | PREFILLED_SYRINGE | Freq: Once | SUBCUTANEOUS | Status: DC

## 2016-02-10 NOTE — Patient Instructions (Signed)
Blood Transfusion   A blood transfusion is a procedure that gives you donated blood through an IV tube. You may need blood because of illness, surgery, or injury. The blood may come from a donor. The blood may also be your own blood that you donated earlier.  The blood you get is made up of different types of cells. You may get:    Red blood cells. These carry oxygen and replace lost blood.    Platelets. These control bleeding.    Plasma. This helps blood to clot.  If you have a clotting disorder, you may also get other types of blood products.   BEFORE THE PROCEDURE   You may have a blood test. This finds out what type of blood you have. It also finds out what kind of blood your body will accept.    If you are going to have a planned surgery, you may donate your own blood. This is done in case you need to have a transfusion.    If you have had an allergic transfusion reaction before, you may be given medicine to help prevent a reaction. Take this medicine only as told by your doctor.   You will have your temperature, blood pressure, and pulse checked.  PROCEDURE    An IV will be started in your hand or arm.    The bag of donated blood will be attached to your IV and run into your vein.    A doctor will regularly check your temperature, blood pressure, and pulse during the procedure. This is done to find any early signs of a transfusion reaction.   If you have any signs or symptoms of a reaction, the procedure may be stopped and you may be given medicine.    When the transfusion is over, your IV will be removed.    Pressure may be applied to the IV site for a few minutes.    A bandage (dressing) will be applied.   The procedure may vary among doctors and hospitals.   AFTER THE PROCEDURE   Your blood pressure, temperature, and pulse will be checked regularly.     This information is not intended to replace advice given to you by your health care provider. Make sure you discuss any questions  you have with your health care provider.     Document Released: 12/11/2008 Document Revised: 10/05/2014 Document Reviewed: 07/25/2014  [employer]vier Interactive Patient Education 2016 [employer]

## 2016-02-10 NOTE — Telephone Encounter (Signed)
Per desk RN I have scheduled appt for plts tomorrow. Desk RN to call the patient

## 2016-02-10 NOTE — Telephone Encounter (Signed)
s.w. pt wife and advised on 5.16 appt

## 2016-02-10 NOTE — Progress Notes (Signed)
Lab resuots faxed to Dr. Loyal Gambler, MD instructed pt to have 1 unit rbc this week, granix x 2day. Called pt who agreed to come in today at 12 for lab, 1 pm for infusion appt. POF sent, Har called in , orders entered

## 2016-02-10 NOTE — Telephone Encounter (Signed)
Call from [facility] at Dr. Sula Rumple office who requested pt have platelet transfusion 5/16.  If pt's platelets are above 50, pt will be seen at Dr. Sula Rumple office for tooth extraction at 230pm 5/16. Spoke with pt in infusion and discussed Granix injection tomorrow, platelet transfusion and lab appt following transfusion. RN explained pt labs will be faxed to Dr. Sula Rumple office. Called pt's wife, notified her of above information. POF to scheduling.

## 2016-02-10 NOTE — Telephone Encounter (Signed)
LAB RESULTS FAXED TO DR. Loyal Gambler ( DENTAL )

## 2016-02-11 ENCOUNTER — Other Ambulatory Visit: Payer: Self-pay | Admitting: *Deleted

## 2016-02-11 ENCOUNTER — Telehealth: Payer: Self-pay | Admitting: *Deleted

## 2016-02-11 ENCOUNTER — Other Ambulatory Visit (HOSPITAL_BASED_OUTPATIENT_CLINIC_OR_DEPARTMENT_OTHER): Payer: Medicare Other

## 2016-02-11 ENCOUNTER — Ambulatory Visit (HOSPITAL_BASED_OUTPATIENT_CLINIC_OR_DEPARTMENT_OTHER): Payer: Medicare Other

## 2016-02-11 ENCOUNTER — Ambulatory Visit: Payer: Medicare Other

## 2016-02-11 VITALS — BP 106/55 | HR 74 | Temp 98.2°F | Resp 16

## 2016-02-11 DIAGNOSIS — D696 Thrombocytopenia, unspecified: Secondary | ICD-10-CM

## 2016-02-11 DIAGNOSIS — D469 Myelodysplastic syndrome, unspecified: Secondary | ICD-10-CM

## 2016-02-11 DIAGNOSIS — D649 Anemia, unspecified: Secondary | ICD-10-CM

## 2016-02-11 DIAGNOSIS — D4620 Refractory anemia with excess of blasts, unspecified: Secondary | ICD-10-CM | POA: Diagnosis not present

## 2016-02-11 LAB — COMPREHENSIVE METABOLIC PANEL
ALBUMIN: 3.4 g/dL — AB (ref 3.5–5.0)
ALK PHOS: 48 U/L (ref 40–150)
ALT: 14 U/L (ref 0–55)
AST: 19 U/L (ref 5–34)
Anion Gap: 4 mEq/L (ref 3–11)
BUN: 13.2 mg/dL (ref 7.0–26.0)
CALCIUM: 8.7 mg/dL (ref 8.4–10.4)
CO2: 26 mEq/L (ref 22–29)
CREATININE: 0.8 mg/dL (ref 0.7–1.3)
Chloride: 108 mEq/L (ref 98–109)
EGFR: 84 mL/min/{1.73_m2} — ABNORMAL LOW (ref >90)
Glucose: 89 mg/dl (ref 70–140)
POTASSIUM: 4 meq/L (ref 3.5–5.1)
Sodium: 138 mEq/L (ref 136–145)
Total Bilirubin: 0.85 mg/dL (ref 0.20–1.20)
Total Protein: 6.7 g/dL (ref 6.4–8.3)

## 2016-02-11 LAB — CBC WITH DIFFERENTIAL/PLATELET
BASO%: 0.4 % (ref 0.0–2.0)
BASOS ABS: 0.1 10*3/uL (ref 0.0–0.1)
EOS ABS: 0 10*3/uL (ref 0.0–0.5)
EOS%: 0.1 % (ref 0.0–7.0)
HEMATOCRIT: 23.8 % — AB (ref 38.4–49.9)
HEMOGLOBIN: 7.8 g/dL — AB (ref 13.0–17.1)
LYMPH#: 1.1 10*3/uL (ref 0.9–3.3)
LYMPH%: 8.3 % — ABNORMAL LOW (ref 14.0–49.0)
MCH: 31.8 pg (ref 27.2–33.4)
MCHC: 32.8 g/dL (ref 32.0–36.0)
MCV: 97.1 fL (ref 79.3–98.0)
MONO#: 1.6 10*3/uL — AB (ref 0.1–0.9)
MONO%: 12.7 % (ref 0.0–14.0)
NEUT#: 10.1 10*3/uL — ABNORMAL HIGH (ref 1.5–6.5)
NEUT%: 78.5 % — ABNORMAL HIGH (ref 39.0–75.0)
PLATELETS: 67 10*3/uL — AB (ref 140–400)
RBC: 2.45 10*6/uL — ABNORMAL LOW (ref 4.20–5.82)
RDW: 24.9 % — ABNORMAL HIGH (ref 11.0–14.6)
WBC: 12.8 10*3/uL — ABNORMAL HIGH (ref 4.0–10.3)
nRBC: 2 % — ABNORMAL HIGH (ref 0–0)

## 2016-02-11 MED ORDER — TBO-FILGRASTIM 300 MCG/0.5ML ~~LOC~~ SOSY
300.0000 ug | PREFILLED_SYRINGE | Freq: Once | SUBCUTANEOUS | Status: AC
  Administered 2016-02-11: 300 ug via SUBCUTANEOUS
  Filled 2016-02-11: qty 0.5

## 2016-02-11 MED ORDER — ACETAMINOPHEN 325 MG PO TABS
650.0000 mg | ORAL_TABLET | Freq: Once | ORAL | Status: AC
  Administered 2016-02-11: 650 mg via ORAL

## 2016-02-11 MED ORDER — ACETAMINOPHEN 325 MG PO TABS
ORAL_TABLET | ORAL | Status: AC
  Filled 2016-02-11: qty 2

## 2016-02-11 MED ORDER — DIPHENHYDRAMINE HCL 25 MG PO CAPS
25.0000 mg | ORAL_CAPSULE | Freq: Once | ORAL | Status: AC
  Administered 2016-02-11: 25 mg via ORAL

## 2016-02-11 MED ORDER — SODIUM CHLORIDE 0.9 % IV SOLN
250.0000 mL | Freq: Once | INTRAVENOUS | Status: AC
  Administered 2016-02-11: 250 mL via INTRAVENOUS

## 2016-02-11 MED ORDER — DIPHENHYDRAMINE HCL 25 MG PO CAPS
ORAL_CAPSULE | ORAL | Status: AC
  Filled 2016-02-11: qty 1

## 2016-02-11 NOTE — Patient Instructions (Signed)
Platelet Transfusion  A platelet transfusion is a procedure in which you receive donated platelets through an IV tube. Platelets are tiny pieces of blood cells. When a blood vessel is damaged, platelets collect in the damaged area to help form a blood clot. This begins the healing process. If your platelet count gets too low, your blood may have trouble clotting.  You may need a platelet transfusion if you have a condition that causes a low number of platelets (thrombocytopenia). A platelet transfusion may be used to stop or prevent bleeding.  LET YOUR HEALTH CARE PROVIDER KNOW ABOUT:   Any allergies you have.   All medicines you are taking, including vitamins, herbs, eye drops, creams, and over-the-counter medicines.   Previous problems you or members of your family have had with the use of anesthetics.   Any blood disorders you have.   Previous surgeries you have had.   Any medical conditions you may have.   Any reactions you have had during a previous transfusion. RISKS AND COMPLICATIONS Generally, this is a safe procedure. However, problems may occur, including:   Fever with or without chills. The fever usually occurs within the first 4 hours of the transfusion and returns to normal within 48 hours.  Allergic reaction. The reaction is most commonly caused by antibodies your body creates against substances in the transfusion. Signs of an allergic reaction may include itching, hives, difficulty breathing, shock, or low blood pressure.  Sudden (acute) or delayed hemolytic reaction. This rare reaction can occur during the transfusion and up to 28 days after the transfusion. The reaction usually occurs when your body's defense system (immune system) attacks the new platelets. Signs of a hemolytic reaction may include fever, headache, difficulty breathing, low blood pressure, a rapid heartbeat, or pain in your back, abdomen, chest, or IV site.  Transfusion-related acute lung injury  (TRALI). TRALI can occur within hours of a transfusion, or several days later. This is a rare reaction that causes lung damage. The cause is not known.  Infection. Signs of this rare complication may include fever, chills, vomiting, a rapid heartbeat, or low blood pressure. BEFORE THE PROCEDURE   You may have a blood test to determine your blood type. This is necessary to find out what kind ofplatelets best matches your platelets.  If you have had an allergic reaction to a transfusion in the past, you may be given medicine to help prevent a reaction. Take this medicine only as directed by your health care provider.  Your temperature, blood pressure, and pulse will be monitored before the transfusion. PROCEDURE  An IV will be started in your hand or arm.  The transfusion will be attached to your IV tubing. The bag of donated platelets will be attached to your IV tube andgiven into your vein.  Your temperature, blood pressure, and pulse will be monitored regularly during the transfusion. This monitoring is done to help detect early signs of a transfusion reaction.  If you have any signs or symptoms of a reaction, your transfusion will be stopped and you may be given medicine.  When your transfusion is complete, your IV will be removed.  Pressure may be applied to the IV site for a few minutes.  A bandage (dressing) will be applied. The procedure may vary among health care providers and hospitals. AFTER THE PROCEDURE  Your blood pressure, temperature, and pulse will be monitored regularly.   This information is not intended to replace advice given to you by your health   care provider. Make sure you discuss any questions you have with your health care provider.   Document Released: 07/12/2007 Document Revised: 10/05/2014 Document Reviewed: 07/25/2014 Elsevier Interactive Patient Education 2016 Elsevier Inc.  

## 2016-02-11 NOTE — Telephone Encounter (Signed)
Lab results faxed to Dr.Mohorns office

## 2016-02-12 LAB — PREPARE PLATELET PHERESIS: Unit division: 0

## 2016-02-14 ENCOUNTER — Other Ambulatory Visit: Payer: Self-pay | Admitting: Medical Oncology

## 2016-02-14 DIAGNOSIS — D469 Myelodysplastic syndrome, unspecified: Secondary | ICD-10-CM

## 2016-02-14 LAB — TYPE AND SCREEN
ABO/RH(D): A POS
Antibody Screen: NEGATIVE
UNIT DIVISION: 0
UNIT DIVISION: 0

## 2016-02-17 ENCOUNTER — Ambulatory Visit (HOSPITAL_BASED_OUTPATIENT_CLINIC_OR_DEPARTMENT_OTHER): Payer: Medicare Other | Admitting: Internal Medicine

## 2016-02-17 ENCOUNTER — Ambulatory Visit (HOSPITAL_BASED_OUTPATIENT_CLINIC_OR_DEPARTMENT_OTHER): Payer: Medicare Other

## 2016-02-17 ENCOUNTER — Telehealth: Payer: Self-pay | Admitting: Internal Medicine

## 2016-02-17 ENCOUNTER — Other Ambulatory Visit (HOSPITAL_BASED_OUTPATIENT_CLINIC_OR_DEPARTMENT_OTHER): Payer: Medicare Other

## 2016-02-17 ENCOUNTER — Encounter: Payer: Self-pay | Admitting: Internal Medicine

## 2016-02-17 VITALS — BP 111/61 | HR 95 | Temp 97.6°F | Resp 18 | Ht 72.0 in | Wt 160.5 lb

## 2016-02-17 DIAGNOSIS — D708 Other neutropenia: Secondary | ICD-10-CM

## 2016-02-17 DIAGNOSIS — D72819 Decreased white blood cell count, unspecified: Secondary | ICD-10-CM

## 2016-02-17 DIAGNOSIS — E46 Unspecified protein-calorie malnutrition: Secondary | ICD-10-CM | POA: Diagnosis not present

## 2016-02-17 DIAGNOSIS — D469 Myelodysplastic syndrome, unspecified: Secondary | ICD-10-CM | POA: Diagnosis present

## 2016-02-17 DIAGNOSIS — D539 Nutritional anemia, unspecified: Secondary | ICD-10-CM

## 2016-02-17 DIAGNOSIS — D61818 Other pancytopenia: Secondary | ICD-10-CM | POA: Diagnosis not present

## 2016-02-17 DIAGNOSIS — R634 Abnormal weight loss: Secondary | ICD-10-CM

## 2016-02-17 DIAGNOSIS — D4620 Refractory anemia with excess of blasts, unspecified: Secondary | ICD-10-CM | POA: Diagnosis present

## 2016-02-17 LAB — COMPREHENSIVE METABOLIC PANEL
ALT: 11 U/L (ref 0–55)
ANION GAP: 8 meq/L (ref 3–11)
AST: 15 U/L (ref 5–34)
Albumin: 3.7 g/dL (ref 3.5–5.0)
Alkaline Phosphatase: 47 U/L (ref 40–150)
BUN: 12 mg/dL (ref 7.0–26.0)
CHLORIDE: 108 meq/L (ref 98–109)
CO2: 24 mEq/L (ref 22–29)
Calcium: 8.5 mg/dL (ref 8.4–10.4)
Creatinine: 0.8 mg/dL (ref 0.7–1.3)
EGFR: 85 mL/min/{1.73_m2} — AB (ref >90)
GLUCOSE: 89 mg/dL (ref 70–140)
Potassium: 3.9 mEq/L (ref 3.5–5.1)
SODIUM: 139 meq/L (ref 136–145)
TOTAL PROTEIN: 7 g/dL (ref 6.4–8.3)
Total Bilirubin: 1.17 mg/dL (ref 0.20–1.20)

## 2016-02-17 LAB — CBC WITH DIFFERENTIAL/PLATELET
BASO%: 0.8 % (ref 0.0–2.0)
Basophils Absolute: 0 10*3/uL (ref 0.0–0.1)
EOS%: 0 % (ref 0.0–7.0)
Eosinophils Absolute: 0 10*3/uL (ref 0.0–0.5)
HCT: 25.3 % — ABNORMAL LOW (ref 38.4–49.9)
HGB: 8.2 g/dL — ABNORMAL LOW (ref 13.0–17.1)
LYMPH%: 61 % — AB (ref 14.0–49.0)
MCH: 32.3 pg (ref 27.2–33.4)
MCHC: 32.4 g/dL (ref 32.0–36.0)
MCV: 99.6 fL — ABNORMAL HIGH (ref 79.3–98.0)
MONO#: 0.3 10*3/uL (ref 0.1–0.9)
MONO%: 20.3 % — AB (ref 0.0–14.0)
NEUT%: 17.9 % — ABNORMAL LOW (ref 39.0–75.0)
NEUTROS ABS: 0.2 10*3/uL — AB (ref 1.5–6.5)
NRBC: 2 % — AB (ref 0–0)
Platelets: 54 10*3/uL — ABNORMAL LOW (ref 140–400)
RBC: 2.54 10*6/uL — AB (ref 4.20–5.82)
RDW: 23.9 % — AB (ref 11.0–14.6)
WBC: 1.2 10*3/uL — AB (ref 4.0–10.3)
lymph#: 0.8 10*3/uL — ABNORMAL LOW (ref 0.9–3.3)

## 2016-02-17 MED ORDER — DARBEPOETIN ALFA 300 MCG/0.6ML IJ SOSY
300.0000 ug | PREFILLED_SYRINGE | Freq: Once | INTRAMUSCULAR | Status: AC
  Administered 2016-02-17: 300 ug via SUBCUTANEOUS
  Filled 2016-02-17: qty 0.6

## 2016-02-17 NOTE — Telephone Encounter (Signed)
Gave and printed appt sched and avs for pt for pt july and Aug pt not going to be her the week of 7.24 adjust

## 2016-02-17 NOTE — Addendum Note (Signed)
Addended by: Lucile Crater on: 02/17/2016 11:14 AM   Modules accepted: Orders, Medications

## 2016-02-17 NOTE — Patient Instructions (Signed)
Darbepoetin Alfa injection What is this medicine? DARBEPOETIN ALFA (dar be POE e tin AL fa) helps your body make more red blood cells. It is used to treat anemia caused by chronic kidney failure and chemotherapy. This medicine may be used for other purposes; ask your health care provider or pharmacist if you have questions. What should I tell my health care provider before I take this medicine? They need to know if you have any of these conditions: -blood clotting disorders or history of blood clots -cancer patient not on chemotherapy -cystic fibrosis -heart disease, such as angina, heart failure, or a history of a heart attack -hemoglobin level of 12 g/dL or greater -high blood pressure -low levels of folate, iron, or vitamin B12 -seizures -an unusual or allergic reaction to darbepoetin, erythropoietin, albumin, hamster proteins, latex, other medicines, foods, dyes, or preservatives -pregnant or trying to get pregnant -breast-feeding How should I use this medicine? This medicine is for injection into a vein or under the skin. It is usually given by a health care professional in a hospital or clinic setting. If you get this medicine at home, you will be taught how to prepare and give this medicine. Do not shake the solution before you withdraw a dose. Use exactly as directed. Take your medicine at regular intervals. Do not take your medicine more often than directed. It is important that you put your used needles and syringes in a special sharps container. Do not put them in a trash can. If you do not have a sharps container, call your pharmacist or healthcare provider to get one. Talk to your pediatrician regarding the use of this medicine in children. While this medicine may be used in children as young as 1 year for selected conditions, precautions do apply. Overdosage: If you think you have taken too much of this medicine contact a poison control center or emergency room at once. NOTE:  This medicine is only for you. Do not share this medicine with others. What if I miss a dose? If you miss a dose, take it as soon as you can. If it is almost time for your next dose, take only that dose. Do not take double or extra doses. What may interact with this medicine? Do not take this medicine with any of the following medications: -epoetin alfa This list may not describe all possible interactions. Give your health care provider a list of all the medicines, herbs, non-prescription drugs, or dietary supplements you use. Also tell them if you smoke, drink alcohol, or use illegal drugs. Some items may interact with your medicine. What should I watch for while using this medicine? Visit your prescriber or health care professional for regular checks on your progress and for the needed blood tests and blood pressure measurements. It is especially important for the doctor to make sure your hemoglobin level is in the desired range, to limit the risk of potential side effects and to give you the best benefit. Keep all appointments for any recommended tests. Check your blood pressure as directed. Ask your doctor what your blood pressure should be and when you should contact him or her. As your body makes more red blood cells, you may need to take iron, folic acid, or vitamin B supplements. Ask your doctor or health care provider which products are right for you. If you have kidney disease continue dietary restrictions, even though this medication can make you feel better. Talk with your doctor or health care professional about the   foods you eat and the vitamins that you take. What side effects may I notice from receiving this medicine? Side effects that you should report to your doctor or health care professional as soon as possible: -allergic reactions like skin rash, itching or hives, swelling of the face, lips, or tongue -breathing problems -changes in vision -chest pain -confusion, trouble speaking  or understanding -feeling faint or lightheaded, falls -high blood pressure -muscle aches or pains -pain, swelling, warmth in the leg -rapid weight gain -severe headaches -sudden numbness or weakness of the face, arm or leg -trouble walking, dizziness, loss of balance or coordination -seizures (convulsions) -swelling of the ankles, feet, hands -unusually weak or tired Side effects that usually do not require medical attention (report to your doctor or health care professional if they continue or are bothersome): -diarrhea -fever, chills (flu-like symptoms) -headaches -nausea, vomiting -redness, stinging, or swelling at site where injected This list may not describe all possible side effects. Call your doctor for medical advice about side effects. You may report side effects to FDA at 1-800-FDA-1088. Where should I keep my medicine? Keep out of the reach of children. Store in a refrigerator between 2 and 8 degrees C (36 and 46 degrees F). Do not freeze. Do not shake. Throw away any unused portion if using a single-dose vial. Throw away any unused medicine after the expiration date. NOTE: This sheet is a summary. It may not cover all possible information. If you have questions about this medicine, talk to your doctor, pharmacist, or health care provider.    2016, Elsevier/Gold Standard. (2008-08-28 10:23:57)  

## 2016-02-17 NOTE — Progress Notes (Signed)
[place] [phone]   Fax:(336) [place] [place], [address] [place] [address]  DIAGNOSIS: 1)  myelodysplastic syndrome consistent with refractory anemia with excess blast diagnosed in December 2016 2)  History of ITP  PRIOR THERAPY: Vidaza 75 mg/m2 IV for 5 days every 4 weeks. Status post 3 cycles..  CURRENT THERAPY:  1) Supportive care with Aranesp 300 g subcutaneously every 3 weeks in addition to Granix 300 mcg subcutaneously for neutropenia as needed.  INTERVAL HISTORY: Dean Stanley 82 y.o. male returns to the clinic today for follow-up visit accompanied by his wife. The patient continues to complain of fatigue. He received PRBC and platelet transfusion recently and preparation for dental extraction that was performed last week. He is feeling a little bit better today. He was able to mowe his grass yesterday. He continues to have pancytopenia but no fever or chills. . He has no bleeding issues. He denied having any significant chest pain, cough or hemoptysis. He has repeat CBC performed earlier today and he is here for evaluation and discussion of his lab results.  MEDICAL HISTORY: Past Medical History  Diagnosis Date  . Meniere's syndrome 1980s  . History of ITP 1960  . Anemia   . Complication of anesthesia     Reaction to anesthesia " things went haywire around me"  . Hypothyroidism   . Hearing impairment     wears hearing left ear  . Cancer (HCC)     myelodysplastic syndrome  . Do not resuscitate 11/04/2015  . Port catheter in place 12/23/2015    ALLERGIES:  is allergic to hydrocodone and oxycodone.  MEDICATIONS:  Current Outpatient Prescriptions  Medication Sig Dispense Refill  . desmopressin (DDAVP) 0.2 MG tablet Take 0.2 mg by mouth daily.    Marland Kitchen dronabinol (MARINOL) 2.5 MG capsule Take 1 capsule (2.5 mg total) by mouth 2 (two) times daily before a meal. (Patient not  taking: Reported on 01/06/2016) 30 capsule 0  . famotidine (PEPCID) 20 MG tablet Take 20 mg by mouth 2 (two) times daily.    . ferrous sulfate 325 (65 FE) MG tablet Take 325 mg by mouth daily with breakfast.    . finasteride (PROPECIA) 1 MG tablet Take 1 mg by mouth daily.    Marland Kitchen levothyroxine (SYNTHROID, LEVOTHROID) 200 MCG tablet Take 200 mcg by mouth daily.    Marland Kitchen lidocaine (XYLOCAINE) 2 % jelly 1 application as needed (for rectal area as needed). Reported on 01/06/2016    . lidocaine-prilocaine (EMLA) cream Apply 1 application topically as needed. 30 g 0  . LORazepam (ATIVAN) 0.5 MG tablet Take 1 tablet (0.5 mg total) by mouth every 8 (eight) hours. (Patient not taking: Reported on 01/06/2016) 30 tablet 0  . Lutein 40 MG CAPS Take 40 mg by mouth daily.    . Multiple Vitamins-Minerals (CENTRUM ADULTS PO) Take 1 tablet by mouth daily.     Marland Kitchen nystatin-triamcinolone ointment (MYCOLOG) Apply 1 application topically 2 (two) times daily. 30 g 0  . omeprazole (PRILOSEC) 20 MG capsule Take 20 mg by mouth daily.    . polyethylene glycol (MIRALAX / GLYCOLAX) packet Take 17 g by mouth daily as needed for mild constipation.    . prochlorperazine (COMPAZINE) 10 MG tablet Take 1 tablet (10 mg total) by mouth every 8 (eight) hours as needed for nausea or vomiting. (Patient not taking: Reported on 01/06/2016) 30 tablet 0  .  senna-docusate (SENNA S) 8.6-50 MG tablet Take 2 tablets by mouth every 6 (six) hours as needed for mild constipation.    . traMADol (ULTRAM) 50 MG tablet Take 50 mg by mouth 3 (three) times daily as needed.     No current facility-administered medications for this visit.   Facility-Administered Medications Ordered in Other Visits  Medication Dose Route Frequency Provider Last Rate Last Dose  . azaCITIDine (VIDAZA) 150 mg in lactated ringers 45 mL chemo infusion  75 mg/m2 (Treatment Plan Actual) Intravenous Daily Curt Bears, MD   150 mg at 12/02/15 1428  . heparin lock flush 100 unit/mL  500  Units Intracatheter Once PRN Curt Bears, MD      . sodium chloride 0.9 % injection 10 mL  10 mL Intracatheter PRN Curt Bears, MD        SURGICAL HISTORY:  Past Surgical History  Procedure Laterality Date  . Colonoscopy    . Cortisone injections to back x 6    . Circumcision    . Cataract Right 2011  . Orif orbital fracture Left 2000    2 fractures orbital platform    REVIEW OF SYSTEMS:  A comprehensive review of systems was negative except for: Constitutional: positive for fatigue   PHYSICAL EXAMINATION: General appearance: alert, cooperative, fatigued and no distress Head: Normocephalic, without obvious abnormality, atraumatic Neck: no adenopathy, no JVD, supple, symmetrical, trachea midline and thyroid not enlarged, symmetric, no tenderness/mass/nodules Lymph nodes: Cervical, supraclavicular, and axillary nodes normal. Resp: clear to auscultation bilaterally Back: symmetric, no curvature. ROM normal. No CVA tenderness. Cardio: regular rate and rhythm, S1, S2 normal, no murmur, click, rub or gallop GI: soft, non-tender; bowel sounds normal; no masses,  no organomegaly Extremities: extremities normal, atraumatic, no cyanosis or edema Neurologic: Alert and oriented X 3, normal strength and tone. Normal symmetric reflexes. Normal coordination and gait  ECOG PERFORMANCE STATUS: 1 - Symptomatic but completely ambulatory  Blood pressure 111/61, pulse 95, temperature 97.6 F (36.4 C), temperature source Oral, resp. rate 18, height 6' (1.829 m), weight 160 lb 8 oz (72.802 kg), SpO2 100 %.  LABORATORY DATA: Lab Results  Component Value Date   WBC 1.2* 02/17/2016   HGB 8.2* 02/17/2016   HCT 25.3* 02/17/2016   MCV 99.6* 02/17/2016   PLT 54* 02/17/2016      Chemistry      Component Value Date/Time   NA 138 02/11/2016 0943   NA 141 08/25/2006 1353   K 4.0 02/11/2016 0943   K 4.4 08/25/2006 1353   CL 104 08/25/2006 1353   CO2 26 02/11/2016 0943   CO2 29 08/25/2006  1353   BUN 13.2 02/11/2016 0943   BUN 14 08/25/2006 1353   CREATININE 0.8 02/11/2016 0943   CREATININE 0.90 08/25/2006 1353      Component Value Date/Time   CALCIUM 8.7 02/11/2016 0943   CALCIUM 9.6 08/25/2006 1353   ALKPHOS 48 02/11/2016 0943   ALKPHOS 45 08/25/2006 1353   AST 19 02/11/2016 0943   AST 18 08/25/2006 1353   ALT 14 02/11/2016 0943   ALT 17 08/25/2006 1353   BILITOT 0.85 02/11/2016 0943   BILITOT 1.2 08/25/2006 1353       RADIOGRAPHIC STUDIES: No results found.  ASSESSMENT AND PLAN:  This is a very pleasant 82 years old white male with recently diagnosed myelodysplastic syndrome consistent with refractory anemia with excess blast.  He is currently undergoing systemic chemotherapy with Vidaza 75 MG/M2 for 5 days every 4 weeks is  status post 3 cycles. He tolerated his treatment fairly well except for the persistent fatigue and pancytopenia secondary to his disease.  His CBC today showed persistent pancytopenia. I recommended for the patient to continue with supportive care for now. For the refractory anemia, I would consider the patient for treatment with Aranesp 300 g subcutaneously every 3 weeks. For the lack of appetite and weight loss, he will continue on Marinol 2.5 mg by mouth twice a day. The patient would come back for follow-up visit in 2 months for reevaluation with repeat blood work. He was advised to call immediately if he has any significant fever or chills or any other signs of infection during this period. The patient voices understanding of current disease status and treatment options and is in agreement with the current care plan.  All questions were answered. The patient knows to call the clinic with any problems, questions or concerns. We can certainly see the patient much sooner if necessary.  Disclaimer: This note was dictated with voice recognition software. Similar sounding words can inadvertently be transcribed and may not be corrected upon  review.

## 2016-02-19 ENCOUNTER — Encounter: Payer: Self-pay | Admitting: Internal Medicine

## 2016-02-19 NOTE — Progress Notes (Signed)
i sent form to [employer]-sendng appeal [facility] -last went to [employer] -refaxed [employer] prior auth req for lidocaine cream [id number]--approved 11/04/15-06/30/16 I sent to medical recrds

## 2016-03-09 ENCOUNTER — Ambulatory Visit (HOSPITAL_COMMUNITY)
Admission: RE | Admit: 2016-03-09 | Discharge: 2016-03-09 | Disposition: A | Discharge status: Home / Self Care | Payer: Medicare Other | Source: Ambulatory Visit | Attending: Internal Medicine | Admitting: Internal Medicine

## 2016-03-09 ENCOUNTER — Other Ambulatory Visit: Payer: Self-pay | Admitting: *Deleted

## 2016-03-09 ENCOUNTER — Other Ambulatory Visit: Payer: Self-pay | Admitting: Internal Medicine

## 2016-03-09 ENCOUNTER — Other Ambulatory Visit: Payer: Self-pay | Admitting: Medical Oncology

## 2016-03-09 ENCOUNTER — Ambulatory Visit (HOSPITAL_BASED_OUTPATIENT_CLINIC_OR_DEPARTMENT_OTHER): Payer: Medicare Other

## 2016-03-09 ENCOUNTER — Other Ambulatory Visit (HOSPITAL_BASED_OUTPATIENT_CLINIC_OR_DEPARTMENT_OTHER): Payer: Medicare Other

## 2016-03-09 ENCOUNTER — Ambulatory Visit: Payer: Medicare Other

## 2016-03-09 VITALS — BP 105/57 | HR 78 | Temp 98.4°F | Resp 16

## 2016-03-09 VITALS — BP 116/54 | HR 72 | Temp 98.3°F | Resp 20

## 2016-03-09 DIAGNOSIS — D539 Nutritional anemia, unspecified: Secondary | ICD-10-CM

## 2016-03-09 DIAGNOSIS — D469 Myelodysplastic syndrome, unspecified: Secondary | ICD-10-CM | POA: Diagnosis present

## 2016-03-09 DIAGNOSIS — D649 Anemia, unspecified: Secondary | ICD-10-CM

## 2016-03-09 DIAGNOSIS — D696 Thrombocytopenia, unspecified: Secondary | ICD-10-CM

## 2016-03-09 DIAGNOSIS — D72819 Decreased white blood cell count, unspecified: Secondary | ICD-10-CM

## 2016-03-09 DIAGNOSIS — D708 Other neutropenia: Secondary | ICD-10-CM

## 2016-03-09 LAB — COMPREHENSIVE METABOLIC PANEL
ALBUMIN: 3.8 g/dL (ref 3.5–5.0)
ALK PHOS: 53 U/L (ref 40–150)
ALT: 9 U/L (ref 0–55)
AST: 13 U/L (ref 5–34)
Anion Gap: 6 mEq/L (ref 3–11)
BILIRUBIN TOTAL: 1.23 mg/dL — AB (ref 0.20–1.20)
BUN: 10.8 mg/dL (ref 7.0–26.0)
CO2: 26 mEq/L (ref 22–29)
CREATININE: 0.8 mg/dL (ref 0.7–1.3)
Calcium: 8.8 mg/dL (ref 8.4–10.4)
Chloride: 106 mEq/L (ref 98–109)
EGFR: 83 mL/min/{1.73_m2} — ABNORMAL LOW (ref >90)
GLUCOSE: 95 mg/dL (ref 70–140)
Potassium: 4.4 mEq/L (ref 3.5–5.1)
SODIUM: 138 meq/L (ref 136–145)
TOTAL PROTEIN: 6.9 g/dL (ref 6.4–8.3)

## 2016-03-09 LAB — CBC WITH DIFFERENTIAL/PLATELET
BASO%: 1.4 % (ref 0.0–2.0)
Basophils Absolute: 0 10*3/uL (ref 0.0–0.1)
EOS%: 0 % (ref 0.0–7.0)
Eosinophils Absolute: 0 10*3/uL (ref 0.0–0.5)
HCT: 21.1 % — ABNORMAL LOW (ref 38.4–49.9)
HEMOGLOBIN: 6.7 g/dL — AB (ref 13.0–17.1)
LYMPH#: 0.6 10*3/uL — AB (ref 0.9–3.3)
LYMPH%: 38.4 % (ref 14.0–49.0)
MCH: 34.7 pg — ABNORMAL HIGH (ref 27.2–33.4)
MCHC: 31.8 g/dL — ABNORMAL LOW (ref 32.0–36.0)
MCV: 109.3 fL — ABNORMAL HIGH (ref 79.3–98.0)
MONO#: 0.3 10*3/uL (ref 0.1–0.9)
MONO%: 20.5 % — ABNORMAL HIGH (ref 0.0–14.0)
NEUT#: 0.6 10*3/uL — ABNORMAL LOW (ref 1.5–6.5)
NEUT%: 39.7 % (ref 39.0–75.0)
NRBC: 3 % — AB (ref 0–0)
Platelets: 67 10*3/uL — ABNORMAL LOW (ref 140–400)
RBC: 1.93 10*6/uL — ABNORMAL LOW (ref 4.20–5.82)
RDW: 29.4 % — AB (ref 11.0–14.6)
WBC: 1.5 10*3/uL — ABNORMAL LOW (ref 4.0–10.3)

## 2016-03-09 LAB — LACTATE DEHYDROGENASE: LDH: 131 U/L (ref 125–245)

## 2016-03-09 LAB — PREPARE RBC (CROSSMATCH)

## 2016-03-09 MED ORDER — TBO-FILGRASTIM 300 MCG/0.5ML ~~LOC~~ SOSY
300.0000 ug | PREFILLED_SYRINGE | Freq: Once | SUBCUTANEOUS | Status: AC
  Administered 2016-03-09: 300 ug via SUBCUTANEOUS
  Filled 2016-03-09: qty 0.5

## 2016-03-09 MED ORDER — SODIUM CHLORIDE 0.9 % IV SOLN: 250.0000 mL | Freq: Once | INTRAVENOUS | Status: DC

## 2016-03-09 MED ORDER — DIPHENHYDRAMINE HCL 25 MG PO CAPS
25.0000 mg | ORAL_CAPSULE | Freq: Once | ORAL | Status: AC
  Administered 2016-03-09: 25 mg via ORAL
  Filled 2016-03-09: qty 1

## 2016-03-09 MED ORDER — SODIUM CHLORIDE 0.9 % IV SOLN
250.0000 mL | Freq: Once | INTRAVENOUS | Status: AC
  Administered 2016-03-09: 250 mL via INTRAVENOUS

## 2016-03-09 MED ORDER — DARBEPOETIN ALFA 300 MCG/0.6ML IJ SOSY
300.0000 ug | PREFILLED_SYRINGE | Freq: Once | INTRAMUSCULAR | Status: AC
  Administered 2016-03-09: 300 ug via SUBCUTANEOUS
  Filled 2016-03-09: qty 0.6

## 2016-03-09 MED ORDER — ACETAMINOPHEN 325 MG PO TABS
650.0000 mg | ORAL_TABLET | Freq: Once | ORAL | Status: AC
  Administered 2016-03-09: 650 mg via ORAL
  Filled 2016-03-09: qty 2

## 2016-03-09 MED ORDER — SODIUM CHLORIDE 0.9% FLUSH: 3.0000 mL | INTRAVENOUS | Status: DC | PRN

## 2016-03-09 NOTE — Patient Instructions (Signed)
Darbepoetin Alfa injection What is this medicine? DARBEPOETIN ALFA (dar be POE e tin AL fa) helps your body make more red blood cells. It is used to treat anemia caused by chronic kidney failure and chemotherapy. This medicine may be used for other purposes; ask your health care provider or pharmacist if you have questions. What should I tell my health care provider before I take this medicine? They need to know if you have any of these conditions: -blood clotting disorders or history of blood clots -cancer patient not on chemotherapy -cystic fibrosis -heart disease, such as angina, heart failure, or a history of a heart attack -hemoglobin level of 12 g/dL or greater -high blood pressure -low levels of folate, iron, or vitamin B12 -seizures -an unusual or allergic reaction to darbepoetin, erythropoietin, albumin, hamster proteins, latex, other medicines, foods, dyes, or preservatives -pregnant or trying to get pregnant -breast-feeding How should I use this medicine? This medicine is for injection into a vein or under the skin. It is usually given by a health care professional in a hospital or clinic setting. If you get this medicine at home, you will be taught how to prepare and give this medicine. Do not shake the solution before you withdraw a dose. Use exactly as directed. Take your medicine at regular intervals. Do not take your medicine more often than directed. It is important that you put your used needles and syringes in a special sharps container. Do not put them in a trash can. If you do not have a sharps container, call your pharmacist or healthcare provider to get one. Talk to your pediatrician regarding the use of this medicine in children. While this medicine may be used in children as young as 1 year for selected conditions, precautions do apply. Overdosage: If you think you have taken too much of this medicine contact a poison control center or emergency room at once. NOTE:  This medicine is only for you. Do not share this medicine with others. What if I miss a dose? If you miss a dose, take it as soon as you can. If it is almost time for your next dose, take only that dose. Do not take double or extra doses. What may interact with this medicine? Do not take this medicine with any of the following medications: -epoetin alfa This list may not describe all possible interactions. Give your health care provider a list of all the medicines, herbs, non-prescription drugs, or dietary supplements you use. Also tell them if you smoke, drink alcohol, or use illegal drugs. Some items may interact with your medicine. What should I watch for while using this medicine? Visit your prescriber or health care professional for regular checks on your progress and for the needed blood tests and blood pressure measurements. It is especially important for the doctor to make sure your hemoglobin level is in the desired range, to limit the risk of potential side effects and to give you the best benefit. Keep all appointments for any recommended tests. Check your blood pressure as directed. Ask your doctor what your blood pressure should be and when you should contact him or her. As your body makes more red blood cells, you may need to take iron, folic acid, or vitamin B supplements. Ask your doctor or health care provider which products are right for you. If you have kidney disease continue dietary restrictions, even though this medication can make you feel better. Talk with your doctor or health care professional about the   foods you eat and the vitamins that you take. What side effects may I notice from receiving this medicine? Side effects that you should report to your doctor or health care professional as soon as possible: -allergic reactions like skin rash, itching or hives, swelling of the face, lips, or tongue -breathing problems -changes in vision -chest pain -confusion, trouble speaking  or understanding -feeling faint or lightheaded, falls -high blood pressure -muscle aches or pains -pain, swelling, warmth in the leg -rapid weight gain -severe headaches -sudden numbness or weakness of the face, arm or leg -trouble walking, dizziness, loss of balance or coordination -seizures (convulsions) -swelling of the ankles, feet, hands -unusually weak or tired Side effects that usually do not require medical attention (report to your doctor or health care professional if they continue or are bothersome): -diarrhea -fever, chills (flu-like symptoms) -headaches -nausea, vomiting -redness, stinging, or swelling at site where injected This list may not describe all possible side effects. Call your doctor for medical advice about side effects. You may report side effects to FDA at 1-800-FDA-1088. Where should I keep my medicine? Keep out of the reach of children. Store in a refrigerator between 2 and 8 degrees C (36 and 46 degrees F). Do not freeze. Do not shake. Throw away any unused portion if using a single-dose vial. Throw away any unused medicine after the expiration date. NOTE: This sheet is a summary. It may not cover all possible information. If you have questions about this medicine, talk to your doctor, pharmacist, or health care provider.    2016, Elsevier/Gold Standard. (2008-08-28 10:23:57) Tbo-Filgrastim injection What is this medicine? TBO-FILGRASTIM (T B O fil GRA stim) is a granulocyte colony-stimulating factor that stimulates the growth of neutrophils, a type of white blood cell important in the body's fight against infection. It is used to reduce the incidence of fever and infection in patients with certain types of cancer who are receiving chemotherapy that affects the bone marrow. This medicine may be used for other purposes; ask your health care provider or pharmacist if you have questions. What should I tell my health care provider before I take this  medicine? They need to know if you have any of these conditions: -ongoing radiation therapy -sickle cell anemia -an unusual or allergic reaction to tbo-filgrastim, filgrastim, pegfilgrastim, other medicines, foods, dyes, or preservatives -pregnant or trying to get pregnant -breast-feeding How should I use this medicine? This medicine is for injection under the skin. If you get this medicine at home, you will be taught how to prepare and give this medicine. Refer to the Instructions for Use that come with your medication packaging. Use exactly as directed. Take your medicine at regular intervals. Do not take your medicine more often than directed. It is important that you put your used needles and syringes in a special sharps container. Do not put them in a trash can. If you do not have a sharps container, call your pharmacist or healthcare provider to get one. Talk to your pediatrician regarding the use of this medicine in children. Special care may be needed. Overdosage: If you think you have taken too much of this medicine contact a poison control center or emergency room at once. NOTE: This medicine is only for you. Do not share this medicine with others. What if I miss a dose? It is important not to miss your dose. Call your doctor or health care professional if you miss a dose. What may interact with this medicine? This medicine   may interact with the following medications: -medicines that may cause a release of neutrophils, such as lithium This list may not describe all possible interactions. Give your health care provider a list of all the medicines, herbs, non-prescription drugs, or dietary supplements you use. Also tell them if you smoke, drink alcohol, or use illegal drugs. Some items may interact with your medicine. What should I watch for while using this medicine? You may need blood work done while you are taking this medicine. What side effects may I notice from receiving this  medicine? Side effects that you should report to your doctor or health care professional as soon as possible: -allergic reactions like skin rash, itching or hives, swelling of the face, lips, or tongue -shortness of breath or breathing problems -fever -pain, redness, or irritation at site where injected -pinpoint red spots on the skin -stomach or side pain, or pain at the shoulder -swelling -tiredness -trouble passing urine Side effects that usually do not require medical attention (Report these to your doctor or health care professional if they continue or are bothersome.): -bone pain -muscle pain This list may not describe all possible side effects. Call your doctor for medical advice about side effects. You may report side effects to FDA at 1-800-FDA-1088. Where should I keep my medicine? Keep out of the reach of children. Store in a refrigerator between 2 and 8 degrees C (36 and 46 degrees F). Keep in carton to protect from light. Throw away this medicine if it is left out of the refrigerator for more than 5 consecutive days. Throw away any unused medicine after the expiration date. NOTE: This sheet is a summary. It may not cover all possible information. If you have questions about this medicine, talk to your doctor, pharmacist, or health care provider.    2016, Elsevier/Gold Standard. (2014-01-04 11:52:29)  

## 2016-03-09 NOTE — Progress Notes (Signed)
Patient ID: Dean Stanley, male   DOB: 1931/04/04, 82 y.o.   MRN: [id number] PCP: Dr. Curt Bears  Associated Diagnosis: Symptomatic anemia   Procedure: 2 units PRBC's infused as ordered.   Patient tolerated transfusion well. No reaction. Went over discharge instructions with patient and he voiced understanding, copy given to patient and his family member. Alert, oriented and ambulatory. Discharged to home.

## 2016-03-09 NOTE — Discharge Instructions (Signed)

## 2016-03-10 ENCOUNTER — Other Ambulatory Visit: Payer: Self-pay | Admitting: Medical Oncology

## 2016-03-10 LAB — TYPE AND SCREEN
ABO/RH(D): A POS
Antibody Screen: NEGATIVE
UNIT DIVISION: 0
Unit division: 0

## 2016-03-27 ENCOUNTER — Other Ambulatory Visit: Payer: Self-pay | Admitting: Medical Oncology

## 2016-03-27 DIAGNOSIS — D469 Myelodysplastic syndrome, unspecified: Secondary | ICD-10-CM

## 2016-03-30 ENCOUNTER — Other Ambulatory Visit (HOSPITAL_BASED_OUTPATIENT_CLINIC_OR_DEPARTMENT_OTHER): Payer: Medicare Other

## 2016-03-30 ENCOUNTER — Ambulatory Visit (HOSPITAL_BASED_OUTPATIENT_CLINIC_OR_DEPARTMENT_OTHER): Payer: Medicare Other

## 2016-03-30 VITALS — BP 119/61 | Temp 97.7°F | Resp 17

## 2016-03-30 DIAGNOSIS — D469 Myelodysplastic syndrome, unspecified: Secondary | ICD-10-CM | POA: Diagnosis present

## 2016-03-30 DIAGNOSIS — D539 Nutritional anemia, unspecified: Secondary | ICD-10-CM

## 2016-03-30 LAB — CBC WITH DIFFERENTIAL/PLATELET
BASO%: 0.7 % (ref 0.0–2.0)
BASOS ABS: 0 10*3/uL (ref 0.0–0.1)
EOS ABS: 0 10*3/uL (ref 0.0–0.5)
EOS%: 0 % (ref 0.0–7.0)
HCT: 26.1 % — ABNORMAL LOW (ref 38.4–49.9)
HEMOGLOBIN: 8.6 g/dL — AB (ref 13.0–17.1)
LYMPH#: 0.8 10*3/uL — AB (ref 0.9–3.3)
LYMPH%: 57.8 % — ABNORMAL HIGH (ref 14.0–49.0)
MCH: 35.1 pg — AB (ref 27.2–33.4)
MCHC: 33 g/dL (ref 32.0–36.0)
MCV: 106.5 fL — AB (ref 79.3–98.0)
MONO#: 0.2 10*3/uL (ref 0.1–0.9)
MONO%: 14.8 % — ABNORMAL HIGH (ref 0.0–14.0)
NEUT%: 26.7 % — AB (ref 39.0–75.0)
NEUTROS ABS: 0.4 10*3/uL — AB (ref 1.5–6.5)
PLATELETS: 81 10*3/uL — AB (ref 140–400)
RBC: 2.45 10*6/uL — ABNORMAL LOW (ref 4.20–5.82)
WBC: 1.4 10*3/uL — AB (ref 4.0–10.3)
nRBC: 2 % — ABNORMAL HIGH (ref 0–0)

## 2016-03-30 MED ORDER — DARBEPOETIN ALFA 300 MCG/0.6ML IJ SOSY
300.0000 ug | PREFILLED_SYRINGE | Freq: Once | INTRAMUSCULAR | Status: AC
  Administered 2016-03-30: 300 ug via SUBCUTANEOUS
  Filled 2016-03-30: qty 0.6

## 2016-03-30 MED ORDER — TBO-FILGRASTIM 300 MCG/0.5ML ~~LOC~~ SOSY
300.0000 ug | PREFILLED_SYRINGE | Freq: Once | SUBCUTANEOUS | Status: AC
  Administered 2016-03-30: 300 ug via SUBCUTANEOUS
  Filled 2016-03-30: qty 0.5

## 2016-03-30 NOTE — Progress Notes (Signed)
Pt Neutrophils 0.4, Pt to get Granix 341mcg per Dr. Julien Nordmann

## 2016-03-30 NOTE — Patient Instructions (Signed)
Darbepoetin Alfa injection What is this medicine? DARBEPOETIN ALFA (dar be POE e tin AL fa) helps your body make more red blood cells. It is used to treat anemia caused by chronic kidney failure and chemotherapy. This medicine may be used for other purposes; ask your health care provider or pharmacist if you have questions. What should I tell my health care provider before I take this medicine? They need to know if you have any of these conditions: -blood clotting disorders or history of blood clots -cancer patient not on chemotherapy -cystic fibrosis -heart disease, such as angina, heart failure, or a history of a heart attack -hemoglobin level of 12 g/dL or greater -high blood pressure -low levels of folate, iron, or vitamin B12 -seizures -an unusual or allergic reaction to darbepoetin, erythropoietin, albumin, hamster proteins, latex, other medicines, foods, dyes, or preservatives -pregnant or trying to get pregnant -breast-feeding How should I use this medicine? This medicine is for injection into a vein or under the skin. It is usually given by a health care professional in a hospital or clinic setting. If you get this medicine at home, you will be taught how to prepare and give this medicine. Do not shake the solution before you withdraw a dose. Use exactly as directed. Take your medicine at regular intervals. Do not take your medicine more often than directed. It is important that you put your used needles and syringes in a special sharps container. Do not put them in a trash can. If you do not have a sharps container, call your pharmacist or healthcare provider to get one. Talk to your pediatrician regarding the use of this medicine in children. While this medicine may be used in children as young as 1 year for selected conditions, precautions do apply. Overdosage: If you think you have taken too much of this medicine contact a poison control center or emergency room at once. NOTE:  This medicine is only for you. Do not share this medicine with others. What if I miss a dose? If you miss a dose, take it as soon as you can. If it is almost time for your next dose, take only that dose. Do not take double or extra doses. What may interact with this medicine? Do not take this medicine with any of the following medications: -epoetin alfa This list may not describe all possible interactions. Give your health care provider a list of all the medicines, herbs, non-prescription drugs, or dietary supplements you use. Also tell them if you smoke, drink alcohol, or use illegal drugs. Some items may interact with your medicine. What should I watch for while using this medicine? Visit your prescriber or health care professional for regular checks on your progress and for the needed blood tests and blood pressure measurements. It is especially important for the doctor to make sure your hemoglobin level is in the desired range, to limit the risk of potential side effects and to give you the best benefit. Keep all appointments for any recommended tests. Check your blood pressure as directed. Ask your doctor what your blood pressure should be and when you should contact him or her. As your body makes more red blood cells, you may need to take iron, folic acid, or vitamin B supplements. Ask your doctor or health care provider which products are right for you. If you have kidney disease continue dietary restrictions, even though this medication can make you feel better. Talk with your doctor or health care professional about the   foods you eat and the vitamins that you take. What side effects may I notice from receiving this medicine? Side effects that you should report to your doctor or health care professional as soon as possible: -allergic reactions like skin rash, itching or hives, swelling of the face, lips, or tongue -breathing problems -changes in vision -chest pain -confusion, trouble speaking  or understanding -feeling faint or lightheaded, falls -high blood pressure -muscle aches or pains -pain, swelling, warmth in the leg -rapid weight gain -severe headaches -sudden numbness or weakness of the face, arm or leg -trouble walking, dizziness, loss of balance or coordination -seizures (convulsions) -swelling of the ankles, feet, hands -unusually weak or tired Side effects that usually do not require medical attention (report to your doctor or health care professional if they continue or are bothersome): -diarrhea -fever, chills (flu-like symptoms) -headaches -nausea, vomiting -redness, stinging, or swelling at site where injected This list may not describe all possible side effects. Call your doctor for medical advice about side effects. You may report side effects to FDA at 1-800-FDA-1088. Where should I keep my medicine? Keep out of the reach of children. Store in a refrigerator between 2 and 8 degrees C (36 and 46 degrees F). Do not freeze. Do not shake. Throw away any unused portion if using a single-dose vial. Throw away any unused medicine after the expiration date. NOTE: This sheet is a summary. It may not cover all possible information. If you have questions about this medicine, talk to your doctor, pharmacist, or health care provider.    2016, Elsevier/Gold Standard. (2008-08-28 10:23:57) Tbo-Filgrastim injection What is this medicine? TBO-FILGRASTIM (T B O fil GRA stim) is a granulocyte colony-stimulating factor that stimulates the growth of neutrophils, a type of white blood cell important in the body's fight against infection. It is used to reduce the incidence of fever and infection in patients with certain types of cancer who are receiving chemotherapy that affects the bone marrow. This medicine may be used for other purposes; ask your health care provider or pharmacist if you have questions. What should I tell my health care provider before I take this  medicine? They need to know if you have any of these conditions: -ongoing radiation therapy -sickle cell anemia -an unusual or allergic reaction to tbo-filgrastim, filgrastim, pegfilgrastim, other medicines, foods, dyes, or preservatives -pregnant or trying to get pregnant -breast-feeding How should I use this medicine? This medicine is for injection under the skin. If you get this medicine at home, you will be taught how to prepare and give this medicine. Refer to the Instructions for Use that come with your medication packaging. Use exactly as directed. Take your medicine at regular intervals. Do not take your medicine more often than directed. It is important that you put your used needles and syringes in a special sharps container. Do not put them in a trash can. If you do not have a sharps container, call your pharmacist or healthcare provider to get one. Talk to your pediatrician regarding the use of this medicine in children. Special care may be needed. Overdosage: If you think you have taken too much of this medicine contact a poison control center or emergency room at once. NOTE: This medicine is only for you. Do not share this medicine with others. What if I miss a dose? It is important not to miss your dose. Call your doctor or health care professional if you miss a dose. What may interact with this medicine? This medicine   may interact with the following medications: -medicines that may cause a release of neutrophils, such as lithium This list may not describe all possible interactions. Give your health care provider a list of all the medicines, herbs, non-prescription drugs, or dietary supplements you use. Also tell them if you smoke, drink alcohol, or use illegal drugs. Some items may interact with your medicine. What should I watch for while using this medicine? You may need blood work done while you are taking this medicine. What side effects may I notice from receiving this  medicine? Side effects that you should report to your doctor or health care professional as soon as possible: -allergic reactions like skin rash, itching or hives, swelling of the face, lips, or tongue -shortness of breath or breathing problems -fever -pain, redness, or irritation at site where injected -pinpoint red spots on the skin -stomach or side pain, or pain at the shoulder -swelling -tiredness -trouble passing urine Side effects that usually do not require medical attention (Report these to your doctor or health care professional if they continue or are bothersome.): -bone pain -muscle pain This list may not describe all possible side effects. Call your doctor for medical advice about side effects. You may report side effects to FDA at 1-800-FDA-1088. Where should I keep my medicine? Keep out of the reach of children. Store in a refrigerator between 2 and 8 degrees C (36 and 46 degrees F). Keep in carton to protect from light. Throw away this medicine if it is left out of the refrigerator for more than 5 consecutive days. Throw away any unused medicine after the expiration date. NOTE: This sheet is a summary. It may not cover all possible information. If you have questions about this medicine, talk to your doctor, pharmacist, or health care provider.    2016, Elsevier/Gold Standard. (2014-01-04 11:52:29)  

## 2016-04-05 ENCOUNTER — Encounter (HOSPITAL_COMMUNITY): Payer: Self-pay | Admitting: Emergency Medicine

## 2016-04-05 ENCOUNTER — Emergency Department (HOSPITAL_COMMUNITY)
Admission: EM | Admit: 2016-04-05 | Discharge: 2016-04-05 | Disposition: A | Discharge status: Home / Self Care | Payer: Medicare Other | Attending: Emergency Medicine | Admitting: Emergency Medicine

## 2016-04-05 DIAGNOSIS — Z792 Long term (current) use of antibiotics: Secondary | ICD-10-CM | POA: Diagnosis not present

## 2016-04-05 DIAGNOSIS — L03115 Cellulitis of right lower limb: Secondary | ICD-10-CM | POA: Insufficient documentation

## 2016-04-05 DIAGNOSIS — E039 Hypothyroidism, unspecified: Secondary | ICD-10-CM | POA: Insufficient documentation

## 2016-04-05 DIAGNOSIS — Z862 Personal history of diseases of the blood and blood-forming organs and certain disorders involving the immune mechanism: Secondary | ICD-10-CM | POA: Diagnosis not present

## 2016-04-05 DIAGNOSIS — D708 Other neutropenia: Secondary | ICD-10-CM | POA: Diagnosis not present

## 2016-04-05 DIAGNOSIS — Z79899 Other long term (current) drug therapy: Secondary | ICD-10-CM | POA: Diagnosis not present

## 2016-04-05 LAB — I-STAT CHEM 8, ED
BUN: 13 mg/dL (ref 6–20)
CALCIUM ION: 1.17 mmol/L (ref 1.12–1.23)
CHLORIDE: 101 mmol/L (ref 101–111)
Creatinine, Ser: 0.7 mg/dL (ref 0.61–1.24)
GLUCOSE: 118 mg/dL — AB (ref 65–99)
HCT: 29 % — ABNORMAL LOW (ref 39.0–52.0)
Hemoglobin: 9.9 g/dL — ABNORMAL LOW (ref 13.0–17.0)
POTASSIUM: 4 mmol/L (ref 3.5–5.1)
Sodium: 138 mmol/L (ref 135–145)
TCO2: 23 mmol/L (ref 0–100)

## 2016-04-05 LAB — CBC WITH DIFFERENTIAL/PLATELET
Basophils Absolute: 0 10*3/uL (ref 0.0–0.1)
Basophils Relative: 2 %
EOS PCT: 1 %
Eosinophils Absolute: 0 10*3/uL (ref 0.0–0.7)
HEMATOCRIT: 27.8 % — AB (ref 39.0–52.0)
Hemoglobin: 9.2 g/dL — ABNORMAL LOW (ref 13.0–17.0)
LYMPHS ABS: 0.9 10*3/uL (ref 0.7–4.0)
Lymphocytes Relative: 63 %
MCH: 35.9 pg — AB (ref 26.0–34.0)
MCHC: 33.1 g/dL (ref 30.0–36.0)
MCV: 108.6 fL — ABNORMAL HIGH (ref 78.0–100.0)
MONO ABS: 0.2 10*3/uL (ref 0.1–1.0)
Monocytes Relative: 12 %
Neutro Abs: 0.3 10*3/uL — ABNORMAL LOW (ref 1.7–7.7)
Neutrophils Relative %: 22 %
Platelets: 87 10*3/uL — ABNORMAL LOW (ref 150–400)
RBC: 2.56 MIL/uL — AB (ref 4.22–5.81)
WBC: 1.4 10*3/uL — CL (ref 4.0–10.5)

## 2016-04-05 MED ORDER — DOXYCYCLINE HYCLATE 100 MG PO CAPS: 100.0000 mg | ORAL_CAPSULE | Freq: Two times a day (BID) | ORAL | Status: DC

## 2016-04-05 NOTE — ED Provider Notes (Signed)
CSN: [id number]     Arrival date & time 04/05/16  1418 History   First MD Initiated Contact with Patient 04/05/16 1437     Chief Complaint  Patient presents with  . Recurrent Skin Infections  . Cellulitis      The history is provided by the patient.  Patient presented for antibiotics for a right lower leg infection. States he scraped the other day. Has a history of myelodysplastic syndrome. Around a week ago had injections to improve his white count and red cells. No fevers. No systemic fevers. He does bruise easily. States Dr. Earlie Server told him to get antibiotics quickly whenever he gets infection. He has some leftover Keflex from January.  Past Medical History  Diagnosis Date  . Meniere's syndrome 1980s  . History of ITP 1960  . Anemia   . Complication of anesthesia     Reaction to anesthesia " things went haywire around me"  . Hypothyroidism   . Hearing impairment     wears hearing left ear  . Cancer (HCC)     myelodysplastic syndrome  . Do not resuscitate 11/04/2015  . Port catheter in place 12/23/2015   Past Surgical History  Procedure Laterality Date  . Colonoscopy    . Cortisone injections to back x 6    . Circumcision    . Cataract Right 2011  . Orif orbital fracture Left 2000    2 fractures orbital platform   Family History  Problem Relation Age of Onset  . Pancreatic cancer Father   . Colon cancer Brother   . Throat cancer Other    Social History  Substance Use Topics  . Smoking status: Never Smoker   . Smokeless tobacco: None  . Alcohol Use: Yes     Comment: Occasional    Review of Systems  Constitutional: Negative for activity change and appetite change.  Eyes: Negative for pain.  Respiratory: Negative for chest tightness and shortness of breath.   Cardiovascular: Negative for chest pain and leg swelling.  Gastrointestinal: Negative for nausea, vomiting, abdominal pain and diarrhea.  Genitourinary: Negative for flank pain.  Musculoskeletal: Negative  for back pain and neck stiffness.  Skin: Negative for rash.  Neurological: Negative for weakness, numbness and headaches.  Hematological: Bruises/bleeds easily.  Psychiatric/Behavioral: Negative for behavioral problems.      Allergies  Hydrocodone and Oxycodone  Home Medications   Prior to Admission medications   Medication Sig Start Date End Date Taking? Authorizing Provider  cephALEXin (KEFLEX) 500 MG capsule Take 500 mg by mouth 4 (four) times daily.   Yes Historical Provider, MD  Darbepoetin Alfa (ARANESP) 300 MCG/0.6ML SOSY injection Inject 300 mcg into the skin every 30 (thirty) days.   Yes Historical Provider, MD  desmopressin (DDAVP) 0.2 MG tablet Take 0.2 mg by mouth daily.   Yes Historical Provider, MD  finasteride (PROPECIA) 1 MG tablet Take 1 mg by mouth daily.   Yes Historical Provider, MD  levothyroxine (SYNTHROID, LEVOTHROID) 200 MCG tablet Take 200 mcg by mouth daily.   Yes Historical Provider, MD  Lutein 40 MG CAPS Take 40 mg by mouth daily. 10/09/15  Yes Historical Provider, MD  Multiple Vitamins-Minerals (CENTRUM ADULTS PO) Take 1 tablet by mouth daily.    Yes Historical Provider, MD  omeprazole (PRILOSEC) 20 MG capsule Take 20 mg by mouth daily. 12/31/15  Yes Lorene Dy, MD  polyethylene glycol [facility] / Floria Raveling) packet Take 17 g by mouth daily as needed for mild constipation.   Yes  Historical Provider, MD  Tbo-Filgrastim (GRANIX) 300 MCG/0.5ML SOSY injection Inject 300 mcg into the skin every 30 (thirty) days.   Yes Historical Provider, MD  triamcinolone (KENALOG) 0.025 % cream Apply 1 application topically 2 (two) times daily.   Yes Historical Provider, MD  doxycycline (VIBRAMYCIN) 100 MG capsule Take 1 capsule (100 mg total) by mouth 2 (two) times daily. 04/05/16   Davonna Belling, MD  senna-docusate (SENNA S) 8.6-50 MG tablet Take 2 tablets by mouth every 6 (six) hours as needed for mild constipation.    Historical Provider, MD  traMADol (ULTRAM) 50 MG tablet  Take 50 mg by mouth 3 (three) times daily as needed for moderate pain or severe pain.  01/04/16   Lorene Dy, MD   BP 118/65 mmHg  Pulse 70  Temp(Src) 98.3 F (36.8 C) (Oral)  Resp 16  Wt 162 lb (73.483 kg)  SpO2 100% Physical Exam  Constitutional: He appears well-developed.  HENT:  Head: Atraumatic.  Eyes: EOM are normal.  Neck: Neck supple.  Cardiovascular: Normal rate.   Pulmonary/Chest: Effort normal.  Abdominal: Soft. There is no tenderness.  Musculoskeletal: He exhibits no edema.  Skin:  Anterior right lower leg shin area has ecchymotic area with some mild erythema. There is small approximately 1 cm scrape superiorly. No fluctuance.  Psychiatric: He has a normal mood and affect.    ED Course  Procedures (including critical care time) Labs Review Labs Reviewed  CBC WITH DIFFERENTIAL/PLATELET - Abnormal; Notable for the following:    WBC 1.4 (*)    RBC 2.56 (*)    Hemoglobin 9.2 (*)    HCT 27.8 (*)    MCV 108.6 (*)    MCH 35.9 (*)    Platelets 87 (*)    Neutro Abs 0.3 (*)    All other components within normal limits  I-STAT CHEM 8, ED - Abnormal; Notable for the following:    Glucose, Bld 118 (*)    Hemoglobin 9.9 (*)    HCT 29.0 (*)    All other components within normal limits    Imaging Review No results found. I have personally reviewed and evaluated these images and lab results as part of my medical decision-making.   EKG Interpretation None      MDM   Final diagnoses:  Cellulitis of right lower extremity  Other neutropenia (HCC)    Patient with possible cellulitis of lower leg. He does have myelodysplastic syndrome and is neutropenic. Discussed with Dr. Benay Spice and will treat with doxycycline. Will return for fevers or worsening redness. Patient is planning on going out of town to [facility] for a farewell tour is now a little less that 1 Clinical    Davonna Belling, MD 04/05/16 [phone]

## 2016-04-05 NOTE — Discharge Instructions (Signed)
Cellulitis Cellulitis is an infection of the skin and the tissue beneath it. The infected area is usually red and tender. Cellulitis occurs most often in the arms and lower legs.  CAUSES  Cellulitis is caused by bacteria that enter the skin through cracks or cuts in the skin. The most common types of bacteria that cause cellulitis are staphylococci and streptococci. SIGNS AND SYMPTOMS   Redness and warmth.  Swelling.  Tenderness or pain.  Fever. DIAGNOSIS  Your health care provider can usually determine what is wrong based on a physical exam. Blood tests may also be done. TREATMENT  Treatment usually involves taking an antibiotic medicine. HOME CARE INSTRUCTIONS   Take your antibiotic medicine as directed by your health care provider. Finish the antibiotic even if you start to feel better.  Keep the infected arm or leg elevated to reduce swelling.  Apply a warm cloth to the affected area up to 4 times per day to relieve pain.  Take medicines only as directed by your health care provider.  Keep all follow-up visits as directed by your health care provider. SEEK MEDICAL CARE IF:   You notice red streaks coming from the infected area.  Your red area gets larger or turns dark in color.  Your bone or joint underneath the infected area becomes painful after the skin has healed.  Your infection returns in the same area or another area.  You notice a swollen bump in the infected area.  You develop new symptoms.  You have a fever. SEEK IMMEDIATE MEDICAL CARE IF:   You feel very sleepy.  You develop vomiting or diarrhea.  You have a general ill feeling (malaise) with muscle aches and pains.   This information is not intended to replace advice given to you by your health care provider. Make sure you discuss any questions you have with your health care provider.   Document Released: 06/24/2005 Document Revised: 06/05/2015 Document Reviewed: 11/30/2011 Elsevier Interactive  Patient Education 2016 [employer].  Neutropenia Neutropenia is a condition that occurs when the level of a certain type of white blood cell (neutrophil) in your body becomes lower than normal. Neutrophils are made in the bone marrow and fight infections. These cells protect against bacteria and viruses. The fewer neutrophils you have, and the longer your body remains without them, the greater your risk of getting a severe infection becomes. CAUSES  The cause of neutropenia may be hard to determine. However, it is usually due to 3 main problems:   Decreased production of neutrophils. This may be due to:  Certain medicines such as chemotherapy.  Genetic problems.  Cancer.  Radiation treatments.  Vitamin deficiency.  Some pesticides.  Increased destruction of neutrophils. This may be due to:  Overwhelming infections.  Hemolytic anemia. This is when the body destroys its own blood cells.  Chemotherapy.  Neutrophils moving to areas of the body where they cannot fight infections. This may be due to:  Dialysis procedures.  Conditions where the spleen becomes enlarged. Neutrophils are held in the spleen and are not available to the rest of the body.  Overwhelming infections. The neutrophils are held in the area of the infection and are not available to the rest of the body. SYMPTOMS  There are no specific symptoms of neutropenia. The lack of neutrophils can result in an infection, and an infection can cause various problems. DIAGNOSIS  Diagnosis is made by a blood test. A complete blood count is performed. The normal level of neutrophils  in human blood differs with age and race. Infants have lower counts than older children and adults. African Americans have lower counts than Caucasians or Asians. The average adult level is 1500 cells/mm3 of blood. Neutrophil counts are interpreted as follows:  Greater than 1000 cells/mm3 gives normal protection against infection.  500 to 1000  cells/mm3 gives an increased risk for infection.  200 to 500 cells/mm3 is a greater risk for severe infection.  Lower than 200 cells/mm3 is a marked risk of infection. This may require hospitalization and treatment with antibiotic medicines. TREATMENT  Treatment depends on the underlying cause, severity, and presence of infections or symptoms. It also depends on your health. Your caregiver will discuss the treatment plan with you. Mild cases are often easily treated and have a good outcome. Preventative measures may also be started to limit your risk of infections. Treatment can include:  Taking antibiotics.  Stopping medicines that are known to cause neutropenia.  Correcting nutritional deficiencies by eating green vegetables to supply folic acid and taking vitamin B supplements.  Stopping exposure to pesticides if your neutropenia is related to pesticide exposure.  Taking a blood growth factor called sargramostim, pegfilgrastim, or filgrastim if you are undergoing chemotherapy for cancer. This stimulates white blood cell production.  Removal of the spleen if you have Felty's syndrome and have repeated infections. HOME CARE INSTRUCTIONS   Follow your caregiver's instructions about when you need to have blood work done.  Wash your hands often. Make sure others who come in contact with you also wash their hands.  Wash raw fruits and vegetables before eating them. They can carry bacteria and fungi.  Avoid people with colds or spreadable (contagious) diseases (chickenpox, herpes zoster, influenza).  Avoid large crowds.  Avoid construction areas. The dust can release fungus into the air.  Be cautious around children in daycare or school environments.  Take care of your respiratory system by coughing and deep breathing.  Bathe daily.  Protect your skin from cuts and burns.  Do not work in the garden or with flowers and plants.  Care for the mouth before and after meals by  brushing with a soft toothbrush. If you have mucositis, do not use mouthwash. Mouthwash contains alcohol and can dry out the mouth even more.  Clean the area between the genitals and the anus (perineal area) after urination and bowel movements. Women need to wipe from front to back.  Use a water soluble lubricant during sexual intercourse and practice good hygiene after. Do not have intercourse if you are severely neutropenic. Check with your caregiver for guidelines.  Exercise daily as tolerated.  Avoid people who were vaccinated with a live vaccine in the past 30 days. You should not receive live vaccines (polio, typhoid).  Do not provide direct care for pets. Avoid animal droppings. Do not clean litter boxes and bird cages.  Do not share food utensils.  Do not use tampons, enemas, or rectal suppositories unless directed by your caregiver.  Use an electric razor to remove hair.  Wash your hands after handling magazines, letters, and newspapers. SEEK IMMEDIATE MEDICAL CARE IF:   You have a fever.  You have chills or start to shake.  You feel nauseous or vomit.  You develop mouth sores.  You develop aches and pains.  You have redness and swelling around open wounds.  Your skin is warm to the touch.  You have pus coming from your wounds.  You develop swollen lymph nodes.  You feel  weak or fatigued.  You develop red streaks on the skin. MAKE SURE YOU:  Understand these instructions.  Will watch your condition.  Will get help right away if you are not doing well or get worse.   This information is not intended to replace advice given to you by your health care provider. Make sure you discuss any questions you have with your health care provider.   Document Released: 03/06/2002 Document Revised: 12/07/2011 Document Reviewed: 03/27/2015 Elsevier Interactive Patient Education [employer].

## 2016-04-05 NOTE — ED Notes (Signed)
Pt with redness and warmth to R lower leg. Denies any other symptoms.

## 2016-04-20 ENCOUNTER — Ambulatory Visit: Payer: Medicare Other

## 2016-04-20 ENCOUNTER — Other Ambulatory Visit: Payer: Medicare Other

## 2016-04-24 ENCOUNTER — Other Ambulatory Visit: Payer: Self-pay | Admitting: *Deleted

## 2016-04-24 DIAGNOSIS — D469 Myelodysplastic syndrome, unspecified: Secondary | ICD-10-CM

## 2016-04-27 ENCOUNTER — Telehealth: Payer: Self-pay | Admitting: Medical Oncology

## 2016-04-27 ENCOUNTER — Ambulatory Visit (HOSPITAL_BASED_OUTPATIENT_CLINIC_OR_DEPARTMENT_OTHER): Payer: Medicare Other | Admitting: Nurse Practitioner

## 2016-04-27 ENCOUNTER — Ambulatory Visit (HOSPITAL_BASED_OUTPATIENT_CLINIC_OR_DEPARTMENT_OTHER): Payer: Medicare Other

## 2016-04-27 ENCOUNTER — Other Ambulatory Visit (HOSPITAL_BASED_OUTPATIENT_CLINIC_OR_DEPARTMENT_OTHER): Payer: Medicare Other

## 2016-04-27 ENCOUNTER — Other Ambulatory Visit: Payer: Self-pay | Admitting: Internal Medicine

## 2016-04-27 ENCOUNTER — Other Ambulatory Visit: Payer: Self-pay | Admitting: Medical Oncology

## 2016-04-27 ENCOUNTER — Encounter: Payer: Self-pay | Admitting: Nurse Practitioner

## 2016-04-27 VITALS — BP 112/50 | HR 67 | Temp 98.3°F | Resp 18

## 2016-04-27 DIAGNOSIS — D539 Nutritional anemia, unspecified: Secondary | ICD-10-CM

## 2016-04-27 DIAGNOSIS — D469 Myelodysplastic syndrome, unspecified: Secondary | ICD-10-CM | POA: Diagnosis not present

## 2016-04-27 DIAGNOSIS — L03115 Cellulitis of right lower limb: Secondary | ICD-10-CM | POA: Diagnosis present

## 2016-04-27 DIAGNOSIS — D708 Other neutropenia: Secondary | ICD-10-CM

## 2016-04-27 LAB — COMPREHENSIVE METABOLIC PANEL
ALBUMIN: 3.8 g/dL (ref 3.5–5.0)
ALK PHOS: 60 U/L (ref 40–150)
ALT: 11 U/L (ref 0–55)
AST: 15 U/L (ref 5–34)
Anion Gap: 5 mEq/L (ref 3–11)
BUN: 10.8 mg/dL (ref 7.0–26.0)
CALCIUM: 9 mg/dL (ref 8.4–10.4)
CO2: 28 mEq/L (ref 22–29)
CREATININE: 0.7 mg/dL (ref 0.7–1.3)
Chloride: 105 mEq/L (ref 98–109)
EGFR: 86 mL/min/{1.73_m2} — ABNORMAL LOW (ref >90)
GLUCOSE: 77 mg/dL (ref 70–140)
POTASSIUM: 4.5 meq/L (ref 3.5–5.1)
SODIUM: 138 meq/L (ref 136–145)
Total Bilirubin: 1.18 mg/dL (ref 0.20–1.20)
Total Protein: 6.9 g/dL (ref 6.4–8.3)

## 2016-04-27 LAB — CBC WITH DIFFERENTIAL/PLATELET
BASO%: 0.8 % (ref 0.0–2.0)
Basophils Absolute: 0 10*3/uL (ref 0.0–0.1)
EOS%: 0 % (ref 0.0–7.0)
Eosinophils Absolute: 0 10*3/uL (ref 0.0–0.5)
HEMATOCRIT: 26.9 % — AB (ref 38.4–49.9)
HEMOGLOBIN: 8.9 g/dL — AB (ref 13.0–17.1)
LYMPH#: 0.7 10*3/uL — AB (ref 0.9–3.3)
LYMPH%: 54.3 % — ABNORMAL HIGH (ref 14.0–49.0)
MCH: 36.3 pg — AB (ref 27.2–33.4)
MCHC: 33.1 g/dL (ref 32.0–36.0)
MCV: 109.8 fL — ABNORMAL HIGH (ref 79.3–98.0)
MONO#: 0.2 10*3/uL (ref 0.1–0.9)
MONO%: 15.5 % — ABNORMAL HIGH (ref 0.0–14.0)
NEUT%: 29.4 % — ABNORMAL LOW (ref 39.0–75.0)
NEUTROS ABS: 0.4 10*3/uL — AB (ref 1.5–6.5)
NRBC: 3 % — AB (ref 0–0)
Platelets: 69 10*3/uL — ABNORMAL LOW (ref 140–400)
RBC: 2.45 10*6/uL — ABNORMAL LOW (ref 4.20–5.82)
RDW: 24.5 % — AB (ref 11.0–14.6)
WBC: 1.3 10*3/uL — AB (ref 4.0–10.3)

## 2016-04-27 MED ORDER — DOXYCYCLINE HYCLATE 100 MG PO CAPS: 100.0000 mg | ORAL_CAPSULE | Freq: Two times a day (BID) | ORAL | 0 refills | Status: DC

## 2016-04-27 MED ORDER — DARBEPOETIN ALFA 300 MCG/0.6ML IJ SOSY
300.0000 ug | PREFILLED_SYRINGE | Freq: Once | INTRAMUSCULAR | Status: AC
  Administered 2016-04-27: 300 ug via SUBCUTANEOUS
  Filled 2016-04-27: qty 0.6

## 2016-04-27 MED ORDER — TBO-FILGRASTIM 300 MCG/0.5ML ~~LOC~~ SOSY
300.0000 ug | PREFILLED_SYRINGE | Freq: Once | SUBCUTANEOUS | Status: AC
  Administered 2016-04-27: 300 ug via SUBCUTANEOUS
  Filled 2016-04-27: qty 0.5

## 2016-04-27 NOTE — Progress Notes (Signed)
[facility]    Chief Complaint: Cellulitis  HPI:  Dean Stanley 82 y.o. male diagnosed with MDS.  Currently undergoing Aranesp and injections on an as-needed basis.  Patient presents to the [facility] today with complaint of recurrent right leg cellulitis.Granix     No history exists.    Review of Systems  Skin:       Right anterior leg cellulitis.  All other systems reviewed and are negative.   Past Medical History:  Diagnosis Date  . Anemia   . Cancer (HCC)    myelodysplastic syndrome  . Complication of anesthesia    Reaction to anesthesia " things went haywire around me"  . Do not resuscitate 11/04/2015  . Hearing impairment    wears hearing left ear  . History of ITP 1960  . Hypothyroidism   . Meniere's syndrome 1980s  . Port catheter in place 12/23/2015    Past Surgical History:  Procedure Laterality Date  . cataract Right 2011  . CIRCUMCISION    . COLONOSCOPY    . cortisone injections to back x 6    . ORIF ORBITAL FRACTURE Left 2000   2 fractures orbital platform    has Deficiency anemia; Meniere's syndrome; Neutropenia ([place]); Leukocytopenia; MDS (myelodysplastic syndrome) ([place]); Rash; Do not resuscitate; Port catheter in place; and Cellulitis of leg, right on his problem list.    is allergic to hydrocodone and oxycodone.    Medication List       Accurate as of 04/27/16  1:28 PM. Always use your most recent med list.          CENTRUM ADULTS PO Take 1 tablet by mouth daily.   cephALEXin 500 MG capsule Commonly known as:  KEFLEX Take 500 mg by mouth 4 (four) times daily.   Darbepoetin Alfa 300 MCG/0.6ML Sosy injection Commonly known as:  ARANESP Inject 300 mcg into the skin every 30 (thirty) days.   desmopressin 0.2 MG tablet Commonly known as:  DDAVP Take 0.2 mg by mouth daily.   doxycycline 100 MG capsule Commonly known as:  VIBRAMYCIN Take 1 capsule (100 mg total) by mouth 2 (two) times daily.   finasteride 1  MG tablet Commonly known as:  PROPECIA Take 1 mg by mouth daily.   GRANIX 300 MCG/0.5ML Sosy injection Generic drug:  Tbo-Filgrastim Inject 300 mcg into the skin every 30 (thirty) days.   levothyroxine 200 MCG tablet Commonly known as:  SYNTHROID, LEVOTHROID Take 200 mcg by mouth daily.   Lutein 40 MG Caps Take 40 mg by mouth daily.   omeprazole 20 MG capsule Commonly known as:  PRILOSEC Take 20 mg by mouth daily.   polyethylene glycol packet Commonly known as:  MIRALAX / GLYCOLAX Take 17 g by mouth daily as needed for mild constipation.   SENNA S 8.6-50 MG tablet Generic drug:  senna-docusate Take 2 tablets by mouth every 6 (six) hours as needed for mild constipation.   traMADol 50 MG tablet Commonly known as:  ULTRAM Take 50 mg by mouth 3 (three) times daily as needed for moderate pain or severe pain.   triamcinolone 0.025 % cream Commonly known as:  KENALOG Apply 1 application topically 2 (two) times daily.        PHYSICAL EXAMINATION  Oncology Vitals 04/27/2016 04/05/2016  Height - -  Weight - -  Weight (lbs) - -  BMI (kg/m2) - -  Temp 98.3 98.3  Pulse 67 70  Resp 18 16  SpO2 98  100  BSA (m2) - -   BP Readings from Last 2 Encounters:  04/27/16 (!) 112/50  04/05/16 118/65    Physical Exam  Constitutional: He is oriented to person, place, and time and well-developed, well-nourished, and in no distress.  HENT:  Head: Normocephalic and atraumatic.  Eyes: Conjunctivae and EOM are normal. Pupils are equal, round, and reactive to light.  Neck: Normal range of motion.  Pulmonary/Chest: Effort normal. No respiratory distress.  Musculoskeletal: Normal range of motion. He exhibits no edema, tenderness or deformity.  Neurological: He is alert and oriented to person, place, and time. Gait normal.  Skin: Skin is warm and dry. No rash noted. No erythema. No pallor.  Exam today reveals a healing scab to the right anterior shin; with no obvious erythema, warmth,  edema, or tenderness.  There is also no red streaks noted.    Psychiatric: Affect normal.  Nursing note and vitals reviewed.   LABORATORY DATA:. Appointment on 04/27/2016  Component Date Value Ref Range Status  . WBC 04/27/2016 1.3* 4.0 - 10.3 10e3/uL Final  . NEUT# 04/27/2016 0.4* 1.5 - 6.5 10e3/uL Final  . HGB 04/27/2016 8.9* 13.0 - 17.1 g/dL Final  . HCT 04/27/2016 26.9* 38.4 - 49.9 % Final  . Platelets 04/27/2016 69* 140 - 400 10e3/uL Final  . MCV 04/27/2016 109.8* 79.3 - 98.0 fL Final  . MCH 04/27/2016 36.3* 27.2 - 33.4 pg Final  . MCHC 04/27/2016 33.1  32.0 - 36.0 g/dL Final  . RBC 04/27/2016 2.45* 4.20 - 5.82 10e6/uL Final  . RDW 04/27/2016 24.5* 11.0 - 14.6 % Final  . lymph# 04/27/2016 0.7* 0.9 - 3.3 10e3/uL Final  . MONO# 04/27/2016 0.2  0.1 - 0.9 10e3/uL Final  . Eosinophils Absolute 04/27/2016 0.0  0.0 - 0.5 10e3/uL Final  . Basophils Absolute 04/27/2016 0.0  0.0 - 0.1 10e3/uL Final  . NEUT% 04/27/2016 29.4* 39.0 - 75.0 % Final  . LYMPH% 04/27/2016 54.3* 14.0 - 49.0 % Final  . MONO% 04/27/2016 15.5* 0.0 - 14.0 % Final  . EOS% 04/27/2016 0.0  0.0 - 7.0 % Final  . BASO% 04/27/2016 0.8  0.0 - 2.0 % Final  . nRBC 04/27/2016 3* 0 - 0 % Final  . Sodium 04/27/2016 138  136 - 145 mEq/L Final  . Potassium 04/27/2016 4.5  3.5 - 5.1 mEq/L Final  . Chloride 04/27/2016 105  98 - 109 mEq/L Final  . CO2 04/27/2016 28  22 - 29 mEq/L Final  . Glucose 04/27/2016 77  70 - 140 mg/dl Final  . BUN 04/27/2016 10.8  7.0 - 26.0 mg/dL Final  . Creatinine 04/27/2016 0.7  0.7 - 1.3 mg/dL Final  . Total Bilirubin 04/27/2016 1.18  0.20 - 1.20 mg/dL Final  . Alkaline Phosphatase 04/27/2016 60  40 - 150 U/L Final  . AST 04/27/2016 15  5 - 34 U/L Final  . ALT 04/27/2016 11  0 - 55 U/L Final  . Total Protein 04/27/2016 6.9  6.4 - 8.3 g/dL Final  . Albumin 04/27/2016 3.8  3.5 - 5.0 g/dL Final  . Calcium 04/27/2016 9.0  8.4 - 10.4 mg/dL Final  . Anion Gap 04/27/2016 5  3 - 11 mEq/L Final  . EGFR  04/27/2016 86* >90 ml/min/1.73 m2 Final    RADIOGRAPHIC STUDIES: No results found.  ASSESSMENT/PLAN:    MDS (myelodysplastic syndrome) [facility]) Patient presented to the [facility] today for follow-up.  Blood counts obtained today reveal a WBC of 1.3, ANC 0.4, hemoglobin  8.9, platelet count 69.  Patient was wearing a mask at today; but was once again reminded that he remains neutropenic.  Vital signs were stable today and patient was afebrile.  Patient will proceed today with his Aranesp injection and his Granix injection.  Per previous orders.  Patient is scheduled to return on 05/18/2016 for labs, follow up visit, and his next injections.  Cellulitis of leg, right Patient states that he presented to the emergency department on 04/05/2016 with a developing cellulitis to his right lower anterior leg after he had bumped into something.  He was given a seven-day supply of doxycycline.  He states that that original cellulitis greatly improved; but then he began re--injured his same right leg after bumping into case of rusty metal.  He states that the area has become mildly red again; and he is requesting a refill of doxycycline.  He denies any recent fevers or chills.  Exam today reveals a healing scab to the right anterior shin; with no obvious erythema, warmth, edema, or tenderness.  There is also no red streaks noted.  Reviewed all findings with Dr. Julien Nordmann; he was in agreement for giving patient a refill of doxycycline for any possible trace cellulitis.  The patient may be experiencing.  Patient was advised to call/return of electrical to the emergency department for any worsening symptoms whatsoever.   Patient stated understanding of all instructions; and was in agreement with this plan of care. The patient knows to call the clinic with any problems, questions or concerns.   Total time spent with patient was 15 minutes;  with greater than 75 percent of that time spent in face to face  counseling regarding patient's symptoms,  and coordination of care and follow up.  Disclaimer:This dictation was prepared with Dragon/digital dictation along with [employer]. Any transcriptional errors that result from this process are unintentional.  Drue Second, NP 04/27/2016

## 2016-04-27 NOTE — Assessment & Plan Note (Signed)
Patient presented to the [facility] today for follow-up.  Blood counts obtained today reveal a WBC of 1.3, ANC 0.4, hemoglobin 8.9, platelet count 69.  Patient was wearing a mask at today; but was once again reminded that he remains neutropenic.  Vital signs were stable today and patient was afebrile.  Patient will proceed today with his Aranesp injection and his Granix injection.  Per previous orders.  Patient is scheduled to return on 05/18/2016 for labs, follow up visit, and his next injections.

## 2016-04-27 NOTE — Patient Instructions (Addendum)
Darbepoetin Alfa injection What is this medicine? DARBEPOETIN ALFA (dar be POE e tin AL fa) helps your body make more red blood cells. It is used to treat anemia caused by chronic kidney failure and chemotherapy. This medicine may be used for other purposes; ask your health care provider or pharmacist if you have questions. What should I tell my health care provider before I take this medicine? They need to know if you have any of these conditions: -blood clotting disorders or history of blood clots -cancer patient not on chemotherapy -cystic fibrosis -heart disease, such as angina, heart failure, or a history of a heart attack -hemoglobin level of 12 g/dL or greater -high blood pressure -low levels of folate, iron, or vitamin B12 -seizures -an unusual or allergic reaction to darbepoetin, erythropoietin, albumin, hamster proteins, latex, other medicines, foods, dyes, or preservatives -pregnant or trying to get pregnant -breast-feeding How should I use this medicine? This medicine is for injection into a vein or under the skin. It is usually given by a health care professional in a hospital or clinic setting. If you get this medicine at home, you will be taught how to prepare and give this medicine. Do not shake the solution before you withdraw a dose. Use exactly as directed. Take your medicine at regular intervals. Do not take your medicine more often than directed. It is important that you put your used needles and syringes in a special sharps container. Do not put them in a trash can. If you do not have a sharps container, call your pharmacist or healthcare provider to get one. Talk to your pediatrician regarding the use of this medicine in children. While this medicine may be used in children as young as 1 year for selected conditions, precautions do apply. Overdosage: If you think you have taken too much of this medicine contact a poison control center or emergency room at once. NOTE:  This medicine is only for you. Do not share this medicine with others. What if I miss a dose? If you miss a dose, take it as soon as you can. If it is almost time for your next dose, take only that dose. Do not take double or extra doses. What may interact with this medicine? Do not take this medicine with any of the following medications: -epoetin alfa This list may not describe all possible interactions. Give your health care provider a list of all the medicines, herbs, non-prescription drugs, or dietary supplements you use. Also tell them if you smoke, drink alcohol, or use illegal drugs. Some items may interact with your medicine. What should I watch for while using this medicine? Visit your prescriber or health care professional for regular checks on your progress and for the needed blood tests and blood pressure measurements. It is especially important for the doctor to make sure your hemoglobin level is in the desired range, to limit the risk of potential side effects and to give you the best benefit. Keep all appointments for any recommended tests. Check your blood pressure as directed. Ask your doctor what your blood pressure should be and when you should contact him or her. As your body makes more red blood cells, you may need to take iron, folic acid, or vitamin B supplements. Ask your doctor or health care provider which products are right for you. If you have kidney disease continue dietary restrictions, even though this medication can make you feel better. Talk with your doctor or health care professional about the   foods you eat and the vitamins that you take. What side effects may I notice from receiving this medicine? Side effects that you should report to your doctor or health care professional as soon as possible: -allergic reactions like skin rash, itching or hives, swelling of the face, lips, or tongue -breathing problems -changes in vision -chest pain -confusion, trouble speaking  or understanding -feeling faint or lightheaded, falls -high blood pressure -muscle aches or pains -pain, swelling, warmth in the leg -rapid weight gain -severe headaches -sudden numbness or weakness of the face, arm or leg -trouble walking, dizziness, loss of balance or coordination -seizures (convulsions) -swelling of the ankles, feet, hands -unusually weak or tired Side effects that usually do not require medical attention (report to your doctor or health care professional if they continue or are bothersome): -diarrhea -fever, chills (flu-like symptoms) -headaches -nausea, vomiting -redness, stinging, or swelling at site where injected This list may not describe all possible side effects. Call your doctor for medical advice about side effects. You may report side effects to FDA at 1-800-FDA-1088. Where should I keep my medicine? Keep out of the reach of children. Store in a refrigerator between 2 and 8 degrees C (36 and 46 degrees F). Do not freeze. Do not shake. Throw away any unused portion if using a single-dose vial. Throw away any unused medicine after the expiration date. NOTE: This sheet is a summary. It may not cover all possible information. If you have questions about this medicine, talk to your doctor, pharmacist, or health care provider.    2016, [employer]. (2008-08-28 10:23:57) Tbo-Filgrastim injection What is this medicine? TBO-FILGRASTIM (T B O fil GRA stim) is a granulocyte colony-stimulating factor that stimulates the growth of neutrophils, a type of white blood cell important in the body's fight against infection. It is used to reduce the incidence of fever and infection in patients with certain types of cancer who are receiving chemotherapy that affects the bone marrow. This medicine may be used for other purposes; ask your health care provider or pharmacist if you have questions. What should I tell my health care provider before I take this  medicine? They need to know if you have any of these conditions: -ongoing radiation therapy -sickle cell anemia -an unusual or allergic reaction to tbo-filgrastim, filgrastim, pegfilgrastim, other medicines, foods, dyes, or preservatives -pregnant or trying to get pregnant -breast-feeding How should I use this medicine? This medicine is for injection under the skin. If you get this medicine at home, you will be taught how to prepare and give this medicine. Refer to the Instructions for Use that come with your medication packaging. Use exactly as directed. Take your medicine at regular intervals. Do not take your medicine more often than directed. It is important that you put your used needles and syringes in a special sharps container. Do not put them in a trash can. If you do not have a sharps container, call your pharmacist or healthcare provider to get one. Talk to your pediatrician regarding the use of this medicine in children. Special care may be needed. Overdosage: If you think you have taken too much of this medicine contact a poison control center or emergency room at once. NOTE: This medicine is only for you. Do not share this medicine with others. What if I miss a dose? It is important not to miss your dose. Call your doctor or health care professional if you miss a dose. What may interact with this medicine? This medicine   may interact with the following medications: -medicines that may cause a release of neutrophils, such as lithium This list may not describe all possible interactions. Give your health care provider a list of all the medicines, herbs, non-prescription drugs, or dietary supplements you use. Also tell them if you smoke, drink alcohol, or use illegal drugs. Some items may interact with your medicine. What should I watch for while using this medicine? You may need blood work done while you are taking this medicine. What side effects may I notice from receiving this  medicine? Side effects that you should report to your doctor or health care professional as soon as possible: -allergic reactions like skin rash, itching or hives, swelling of the face, lips, or tongue -shortness of breath or breathing problems -fever -pain, redness, or irritation at site where injected -pinpoint red spots on the skin -stomach or side pain, or pain at the shoulder -swelling -tiredness -trouble passing urine Side effects that usually do not require medical attention (Report these to your doctor or health care professional if they continue or are bothersome.): -bone pain -muscle pain This list may not describe all possible side effects. Call your doctor for medical advice about side effects. You may report side effects to FDA at 1-800-FDA-1088. Where should I keep my medicine? Keep out of the reach of children. Store in a refrigerator between 2 and 8 degrees C (36 and 46 degrees F). Keep in carton to protect from light. Throw away this medicine if it is left out of the refrigerator for more than 5 consecutive days. Throw away any unused medicine after the expiration date. NOTE: This sheet is a summary. It may not cover all possible information. If you have questions about this medicine, talk to your doctor, pharmacist, or health care provider.    2016, [employer]. (2014-01-04 11:52:29) Tbo-Filgrastim injection What is this medicine? TBO-FILGRASTIM (T B O fil GRA stim) is a granulocyte colony-stimulating factor that stimulates the growth of neutrophils, a type of white blood cell important in the body's fight against infection. It is used to reduce the incidence of fever and infection in patients with certain types of cancer who are receiving chemotherapy that affects the bone marrow. This medicine may be used for other purposes; ask your health care provider or pharmacist if you have questions. What should I tell my health care provider before I take this  medicine? They need to know if you have any of these conditions: -ongoing radiation therapy -sickle cell anemia -an unusual or allergic reaction to tbo-filgrastim, filgrastim, pegfilgrastim, other medicines, foods, dyes, or preservatives -pregnant or trying to get pregnant -breast-feeding How should I use this medicine? This medicine is for injection under the skin. If you get this medicine at home, you will be taught how to prepare and give this medicine. Refer to the Instructions for Use that come with your medication packaging. Use exactly as directed. Take your medicine at regular intervals. Do not take your medicine more often than directed. It is important that you put your used needles and syringes in a special sharps container. Do not put them in a trash can. If you do not have a sharps container, call your pharmacist or healthcare provider to get one. Talk to your pediatrician regarding the use of this medicine in children. Special care may be needed. Overdosage: If you think you have taken too much of this medicine contact a poison control center or emergency room at once. NOTE: This medicine is only  for you. Do not share this medicine with others. What if I miss a dose? It is important not to miss your dose. Call your doctor or health care professional if you miss a dose. What may interact with this medicine? This medicine may interact with the following medications: -medicines that may cause a release of neutrophils, such as lithium This list may not describe all possible interactions. Give your health care provider a list of all the medicines, herbs, non-prescription drugs, or dietary supplements you use. Also tell them if you smoke, drink alcohol, or use illegal drugs. Some items may interact with your medicine. What should I watch for while using this medicine? You may need blood work done while you are taking this medicine. What side effects may I notice from receiving this  medicine? Side effects that you should report to your doctor or health care professional as soon as possible: -allergic reactions like skin rash, itching or hives, swelling of the face, lips, or tongue -shortness of breath or breathing problems -fever -pain, redness, or irritation at site where injected -pinpoint red spots on the skin -stomach or side pain, or pain at the shoulder -swelling -tiredness -trouble passing urine Side effects that usually do not require medical attention (Report these to your doctor or health care professional if they continue or are bothersome.): -bone pain -muscle pain This list may not describe all possible side effects. Call your doctor for medical advice about side effects. You may report side effects to FDA at 1-800-FDA-1088. Where should I keep my medicine? Keep out of the reach of children. Store in a refrigerator between 2 and 8 degrees C (36 and 46 degrees F). Keep in carton to protect from light. Throw away this medicine if it is left out of the refrigerator for more than 5 consecutive days. Throw away any unused medicine after the expiration date. NOTE: This sheet is a summary. It may not cover all possible information. If you have questions about this medicine, talk to your doctor, pharmacist, or health care provider.    2016, [employer]. (2014-01-04 11:52:29)

## 2016-04-27 NOTE — Telephone Encounter (Signed)
Seen in [facility] recently - cellulitis on doxycycline . Wife requests someone to look at it. [facility] visit today.

## 2016-04-27 NOTE — Assessment & Plan Note (Signed)
Patient states that he presented to the emergency department on 04/05/2016 with a developing cellulitis to his right lower anterior leg after he had bumped into something.  He was given a seven-day supply of doxycycline.  He states that that original cellulitis greatly improved; but then he began re--injured his same right leg after bumping into case of rusty metal.  He states that the area has become mildly red again; and he is requesting a refill of doxycycline.  He denies any recent fevers or chills.  Exam today reveals a healing scab to the right anterior shin; with no obvious erythema, warmth, edema, or tenderness.  There is also no red streaks noted.  Reviewed all findings with Dr. Julien Nordmann; he was in agreement for giving patient a refill of doxycycline for any possible trace cellulitis.  The patient may be experiencing.  Patient was advised to call/return of electrical to the emergency department for any worsening symptoms whatsoever.

## 2016-04-29 ENCOUNTER — Telehealth: Payer: Self-pay | Admitting: *Deleted

## 2016-04-29 NOTE — Telephone Encounter (Signed)
TC to patient in follow up of [facility] visit on 04/27/16. No answer but was able to leave vm message.  Pt has follow up visit with Dr. Julien Nordmann on 05/18/16

## 2016-05-11 ENCOUNTER — Ambulatory Visit: Payer: Medicare Other

## 2016-05-11 ENCOUNTER — Other Ambulatory Visit: Payer: Medicare Other

## 2016-05-11 ENCOUNTER — Ambulatory Visit: Payer: Medicare Other | Admitting: Internal Medicine

## 2016-05-15 ENCOUNTER — Other Ambulatory Visit: Payer: Self-pay | Admitting: Medical Oncology

## 2016-05-15 DIAGNOSIS — D469 Myelodysplastic syndrome, unspecified: Secondary | ICD-10-CM

## 2016-05-18 ENCOUNTER — Telehealth: Payer: Self-pay | Admitting: Internal Medicine

## 2016-05-18 ENCOUNTER — Encounter: Payer: Self-pay | Admitting: Internal Medicine

## 2016-05-18 ENCOUNTER — Other Ambulatory Visit: Payer: Self-pay | Admitting: Medical Oncology

## 2016-05-18 ENCOUNTER — Ambulatory Visit (HOSPITAL_BASED_OUTPATIENT_CLINIC_OR_DEPARTMENT_OTHER): Payer: Medicare Other

## 2016-05-18 ENCOUNTER — Ambulatory Visit (HOSPITAL_BASED_OUTPATIENT_CLINIC_OR_DEPARTMENT_OTHER): Payer: Medicare Other | Admitting: Internal Medicine

## 2016-05-18 ENCOUNTER — Other Ambulatory Visit (HOSPITAL_BASED_OUTPATIENT_CLINIC_OR_DEPARTMENT_OTHER): Payer: Medicare Other

## 2016-05-18 ENCOUNTER — Other Ambulatory Visit: Payer: Self-pay | Admitting: Internal Medicine

## 2016-05-18 VITALS — BP 113/60 | HR 66 | Temp 98.6°F | Resp 18 | Ht 72.0 in | Wt 174.7 lb

## 2016-05-18 DIAGNOSIS — D469 Myelodysplastic syndrome, unspecified: Secondary | ICD-10-CM | POA: Diagnosis present

## 2016-05-18 DIAGNOSIS — D539 Nutritional anemia, unspecified: Secondary | ICD-10-CM

## 2016-05-18 DIAGNOSIS — R530 Neoplastic (malignant) related fatigue: Secondary | ICD-10-CM

## 2016-05-18 DIAGNOSIS — D649 Anemia, unspecified: Secondary | ICD-10-CM

## 2016-05-18 DIAGNOSIS — R634 Abnormal weight loss: Secondary | ICD-10-CM | POA: Diagnosis not present

## 2016-05-18 DIAGNOSIS — E46 Unspecified protein-calorie malnutrition: Secondary | ICD-10-CM | POA: Diagnosis not present

## 2016-05-18 DIAGNOSIS — D72819 Decreased white blood cell count, unspecified: Secondary | ICD-10-CM

## 2016-05-18 DIAGNOSIS — D709 Neutropenia, unspecified: Secondary | ICD-10-CM

## 2016-05-18 DIAGNOSIS — D4620 Refractory anemia with excess of blasts, unspecified: Secondary | ICD-10-CM

## 2016-05-18 LAB — CBC WITH DIFFERENTIAL/PLATELET
BASO%: 1.3 % (ref 0.0–2.0)
BASOS ABS: 0 10*3/uL (ref 0.0–0.1)
EOS%: 0 % (ref 0.0–7.0)
Eosinophils Absolute: 0 10*3/uL (ref 0.0–0.5)
HCT: 29.2 % — ABNORMAL LOW (ref 38.4–49.9)
HGB: 9.7 g/dL — ABNORMAL LOW (ref 13.0–17.1)
LYMPH#: 0.9 10*3/uL (ref 0.9–3.3)
LYMPH%: 59.3 % — ABNORMAL HIGH (ref 14.0–49.0)
MCH: 36.6 pg — ABNORMAL HIGH (ref 27.2–33.4)
MCHC: 33.2 g/dL (ref 32.0–36.0)
MCV: 110.2 fL — AB (ref 79.3–98.0)
MONO#: 0.3 10*3/uL (ref 0.1–0.9)
MONO%: 16.7 % — AB (ref 0.0–14.0)
NEUT%: 22.7 % — ABNORMAL LOW (ref 39.0–75.0)
NEUTROS ABS: 0.3 10*3/uL — AB (ref 1.5–6.5)
NRBC: 1 % — AB (ref 0–0)
Platelets: 68 10*3/uL — ABNORMAL LOW (ref 140–400)
RBC: 2.65 10*6/uL — AB (ref 4.20–5.82)
RDW: 21.8 % — AB (ref 11.0–14.6)
WBC: 1.5 10*3/uL — AB (ref 4.0–10.3)

## 2016-05-18 MED ORDER — DARBEPOETIN ALFA 300 MCG/0.6ML IJ SOSY
300.0000 ug | PREFILLED_SYRINGE | INTRAMUSCULAR | Status: DC
  Administered 2016-05-18: 300 ug via SUBCUTANEOUS
  Filled 2016-05-18: qty 0.6

## 2016-05-18 NOTE — Patient Instructions (Signed)
Darbepoetin Alfa injection What is this medicine? DARBEPOETIN ALFA (dar be POE e tin AL fa) helps your body make more red blood cells. It is used to treat anemia caused by chronic kidney failure and chemotherapy. This medicine may be used for other purposes; ask your health care provider or pharmacist if you have questions. What should I tell my health care provider before I take this medicine? They need to know if you have any of these conditions: -blood clotting disorders or history of blood clots -cancer patient not on chemotherapy -cystic fibrosis -heart disease, such as angina, heart failure, or a history of a heart attack -hemoglobin level of 12 g/dL or greater -high blood pressure -low levels of folate, iron, or vitamin B12 -seizures -an unusual or allergic reaction to darbepoetin, erythropoietin, albumin, hamster proteins, latex, other medicines, foods, dyes, or preservatives -pregnant or trying to get pregnant -breast-feeding How should I use this medicine? This medicine is for injection into a vein or under the skin. It is usually given by a health care professional in a hospital or clinic setting. If you get this medicine at home, you will be taught how to prepare and give this medicine. Do not shake the solution before you withdraw a dose. Use exactly as directed. Take your medicine at regular intervals. Do not take your medicine more often than directed. It is important that you put your used needles and syringes in a special sharps container. Do not put them in a trash can. If you do not have a sharps container, call your pharmacist or healthcare provider to get one. Talk to your pediatrician regarding the use of this medicine in children. While this medicine may be used in children as young as 1 year for selected conditions, precautions do apply. Overdosage: If you think you have taken too much of this medicine contact a poison control center or emergency room at once. NOTE:  This medicine is only for you. Do not share this medicine with others. What if I miss a dose? If you miss a dose, take it as soon as you can. If it is almost time for your next dose, take only that dose. Do not take double or extra doses. What may interact with this medicine? Do not take this medicine with any of the following medications: -epoetin alfa This list may not describe all possible interactions. Give your health care provider a list of all the medicines, herbs, non-prescription drugs, or dietary supplements you use. Also tell them if you smoke, drink alcohol, or use illegal drugs. Some items may interact with your medicine. What should I watch for while using this medicine? Visit your prescriber or health care professional for regular checks on your progress and for the needed blood tests and blood pressure measurements. It is especially important for the doctor to make sure your hemoglobin level is in the desired range, to limit the risk of potential side effects and to give you the best benefit. Keep all appointments for any recommended tests. Check your blood pressure as directed. Ask your doctor what your blood pressure should be and when you should contact him or her. As your body makes more red blood cells, you may need to take iron, folic acid, or vitamin B supplements. Ask your doctor or health care provider which products are right for you. If you have kidney disease continue dietary restrictions, even though this medication can make you feel better. Talk with your doctor or health care professional about the   foods you eat and the vitamins that you take. What side effects may I notice from receiving this medicine? Side effects that you should report to your doctor or health care professional as soon as possible: -allergic reactions like skin rash, itching or hives, swelling of the face, lips, or tongue -breathing problems -changes in vision -chest pain -confusion, trouble speaking  or understanding -feeling faint or lightheaded, falls -high blood pressure -muscle aches or pains -pain, swelling, warmth in the leg -rapid weight gain -severe headaches -sudden numbness or weakness of the face, arm or leg -trouble walking, dizziness, loss of balance or coordination -seizures (convulsions) -swelling of the ankles, feet, hands -unusually weak or tired Side effects that usually do not require medical attention (report to your doctor or health care professional if they continue or are bothersome): -diarrhea -fever, chills (flu-like symptoms) -headaches -nausea, vomiting -redness, stinging, or swelling at site where injected This list may not describe all possible side effects. Call your doctor for medical advice about side effects. You may report side effects to FDA at 1-800-FDA-1088. Where should I keep my medicine? Keep out of the reach of children. Store in a refrigerator between 2 and 8 degrees C (36 and 46 degrees F). Do not freeze. Do not shake. Throw away any unused portion if using a single-dose vial. Throw away any unused medicine after the expiration date. NOTE: This sheet is a summary. It may not cover all possible information. If you have questions about this medicine, talk to your doctor, pharmacist, or health care provider.    2016, [employer] Standard. (2008-08-28 10:23:57) Tbo-Filgrastim injection What is this medicine? TBO-FILGRASTIM (T B O fil GRA stim) is a granulocyte colony-stimulating factor that stimulates the growth of neutrophils, a type of white blood cell important in the body's fight against infection. It is used to reduce the incidence of fever and infection in patients with certain types of cancer who are receiving chemotherapy that affects the bone marrow. This medicine may be used for other purposes; ask your health care provider or pharmacist if you have questions. What should I tell my health care provider before I take this  medicine? They need to know if you have any of these conditions: -ongoing radiation therapy -sickle cell anemia -an unusual or allergic reaction to tbo-filgrastim, filgrastim, pegfilgrastim, other medicines, foods, dyes, or preservatives -pregnant or trying to get pregnant -breast-feeding How should I use this medicine? This medicine is for injection under the skin. If you get this medicine at home, you will be taught how to prepare and give this medicine. Refer to the Instructions for Use that come with your medication packaging. Use exactly as directed. Take your medicine at regular intervals. Do not take your medicine more often than directed. It is important that you put your used needles and syringes in a special sharps container. Do not put them in a trash can. If you do not have a sharps container, call your pharmacist or healthcare provider to get one. Talk to your pediatrician regarding the use of this medicine in children. Special care may be needed. Overdosage: If you think you have taken too much of this medicine contact a poison control center or emergency room at once. NOTE: This medicine is only for you. Do not share this medicine with others. What if I miss a dose? It is important not to miss your dose. Call your doctor or health care professional if you miss a dose. What may interact with this medicine? This medicine   may interact with the following medications: -medicines that may cause a release of neutrophils, such as lithium This list may not describe all possible interactions. Give your health care provider a list of all the medicines, herbs, non-prescription drugs, or dietary supplements you use. Also tell them if you smoke, drink alcohol, or use illegal drugs. Some items may interact with your medicine. What should I watch for while using this medicine? You may need blood work done while you are taking this medicine. What side effects may I notice from receiving this  medicine? Side effects that you should report to your doctor or health care professional as soon as possible: -allergic reactions like skin rash, itching or hives, swelling of the face, lips, or tongue -shortness of breath or breathing problems -fever -pain, redness, or irritation at site where injected -pinpoint red spots on the skin -stomach or side pain, or pain at the shoulder -swelling -tiredness -trouble passing urine Side effects that usually do not require medical attention (Report these to your doctor or health care professional if they continue or are bothersome.): -bone pain -muscle pain This list may not describe all possible side effects. Call your doctor for medical advice about side effects. You may report side effects to FDA at 1-800-FDA-1088. Where should I keep my medicine? Keep out of the reach of children. Store in a refrigerator between 2 and 8 degrees C (36 and 46 degrees F). Keep in carton to protect from light. Throw away this medicine if it is left out of the refrigerator for more than 5 consecutive days. Throw away any unused medicine after the expiration date. NOTE: This sheet is a summary. It may not cover all possible information. If you have questions about this medicine, talk to your doctor, pharmacist, or health care provider.    2016, [employer] Standard. (2014-01-04 11:52:29) Tbo-Filgrastim injection What is this medicine? TBO-FILGRASTIM (T B O fil GRA stim) is a granulocyte colony-stimulating factor that stimulates the growth of neutrophils, a type of white blood cell important in the body's fight against infection. It is used to reduce the incidence of fever and infection in patients with certain types of cancer who are receiving chemotherapy that affects the bone marrow. This medicine may be used for other purposes; ask your health care provider or pharmacist if you have questions. What should I tell my health care provider before I take this  medicine? They need to know if you have any of these conditions: -ongoing radiation therapy -sickle cell anemia -an unusual or allergic reaction to tbo-filgrastim, filgrastim, pegfilgrastim, other medicines, foods, dyes, or preservatives -pregnant or trying to get pregnant -breast-feeding How should I use this medicine? This medicine is for injection under the skin. If you get this medicine at home, you will be taught how to prepare and give this medicine. Refer to the Instructions for Use that come with your medication packaging. Use exactly as directed. Take your medicine at regular intervals. Do not take your medicine more often than directed. It is important that you put your used needles and syringes in a special sharps container. Do not put them in a trash can. If you do not have a sharps container, call your pharmacist or healthcare provider to get one. Talk to your pediatrician regarding the use of this medicine in children. Special care may be needed. Overdosage: If you think you have taken too much of this medicine contact a poison control center or emergency room at once. NOTE: This medicine is only  for you. Do not share this medicine with others. What if I miss a dose? It is important not to miss your dose. Call your doctor or health care professional if you miss a dose. What may interact with this medicine? This medicine may interact with the following medications: -medicines that may cause a release of neutrophils, such as lithium This list may not describe all possible interactions. Give your health care provider a list of all the medicines, herbs, non-prescription drugs, or dietary supplements you use. Also tell them if you smoke, drink alcohol, or use illegal drugs. Some items may interact with your medicine. What should I watch for while using this medicine? You may need blood work done while you are taking this medicine. What side effects may I notice from receiving this  medicine? Side effects that you should report to your doctor or health care professional as soon as possible: -allergic reactions like skin rash, itching or hives, swelling of the face, lips, or tongue -shortness of breath or breathing problems -fever -pain, redness, or irritation at site where injected -pinpoint red spots on the skin -stomach or side pain, or pain at the shoulder -swelling -tiredness -trouble passing urine Side effects that usually do not require medical attention (Report these to your doctor or health care professional if they continue or are bothersome.): -bone pain -muscle pain This list may not describe all possible side effects. Call your doctor for medical advice about side effects. You may report side effects to FDA at 1-800-FDA-1088. Where should I keep my medicine? Keep out of the reach of children. Store in a refrigerator between 2 and 8 degrees C (36 and 46 degrees F). Keep in carton to protect from light. Throw away this medicine if it is left out of the refrigerator for more than 5 consecutive days. Throw away any unused medicine after the expiration date. NOTE: This sheet is a summary. It may not cover all possible information. If you have questions about this medicine, talk to your doctor, pharmacist, or health care provider.    2016, [employer] Standard. (2014-01-04 11:52:29)

## 2016-05-18 NOTE — Telephone Encounter (Signed)
Gave patient avs report and appointments for September thru November  °

## 2016-05-18 NOTE — Progress Notes (Signed)
[facility] [phone]   Fax:([phone]) [address], [address] [place] [address]  DIAGNOSIS: 1)  myelodysplastic syndrome consistent with refractory anemia with excess blast diagnosed in December 2016 2)  History of ITP  PRIOR THERAPY: Vidaza 75 mg/m2 IV for 5 days every 4 weeks. Status post 3 cycles..  CURRENT THERAPY:  1) Supportive care with Aranesp 300 g subcutaneously every 3 weeks in addition to Granix 300 mcg subcutaneously for neutropenia as needed.  INTERVAL HISTORY: Dean Stanley 82 y.o. male returns to the clinic today for follow-up visit accompanied by his wife. The patient continues to complain of fatigue. He has persistent anemia and he required 2 units of PRBCs transfusion 3 weeks ago. He is currently on Aranesp and Granix as needed. He continues to have pancytopenia but no fever or chills. . He has no bleeding issues. He denied having any significant chest pain, cough or hemoptysis. He was treated recently for cellulitis of the lower extremities and feeling much better. He has repeat CBC performed earlier today and he is here for evaluation and discussion of his lab results.  MEDICAL HISTORY: Past Medical History:  Diagnosis Date  . Anemia   . Cancer (HCC)    myelodysplastic syndrome  . Complication of anesthesia    Reaction to anesthesia " things went haywire around me"  . Do not resuscitate 11/04/2015  . Hearing impairment    wears hearing left ear  . History of ITP 1960  . Hypothyroidism   . Meniere's syndrome 1980s  . Port catheter in place 12/23/2015    ALLERGIES:  is allergic to hydrocodone and oxycodone.  MEDICATIONS:  Current Outpatient Prescriptions  Medication Sig Dispense Refill  . Darbepoetin Alfa (ARANESP) 300 MCG/0.6ML SOSY injection Inject 300 mcg into the skin every 30 (thirty) days.    Marland Kitchen desmopressin (DDAVP) 0.2 MG tablet Take 0.2 mg by mouth daily.     . finasteride (PROPECIA) 1 MG tablet Take 1 mg by mouth daily.    Marland Kitchen levothyroxine (SYNTHROID, LEVOTHROID) 200 MCG tablet Take 200 mcg by mouth daily.    . Lutein 40 MG CAPS Take 40 mg by mouth daily.    . Multiple Vitamins-Minerals (CENTRUM ADULTS PO) Take 1 tablet by mouth daily.     . Tbo-Filgrastim (GRANIX) 300 MCG/0.5ML SOSY injection Inject 300 mcg into the skin every 30 (thirty) days.    Marland Kitchen triamcinolone (KENALOG) 0.025 % cream Apply 1 application topically 2 (two) times daily.    Marland Kitchen senna-docusate (SENNA S) 8.6-50 MG tablet Take 2 tablets by mouth every 6 (six) hours as needed for mild constipation.    . traMADol (ULTRAM) 50 MG tablet Take 50 mg by mouth 3 (three) times daily as needed for moderate pain or severe pain.      No current facility-administered medications for this visit.    Facility-Administered Medications Ordered in Other Visits  Medication Dose Route Frequency Provider Last Rate Last Dose  . azaCITIDine (VIDAZA) 150 mg in lactated ringers 45 mL chemo infusion  75 mg/m2 (Treatment Plan Recorded) Intravenous Daily Curt Bears, MD   150 mg at 12/02/15 1428  . heparin lock flush 100 unit/mL  500 Units Intracatheter Once PRN Curt Bears, MD      . sodium chloride 0.9 % injection 10 mL  10 mL Intracatheter PRN Curt Bears, MD        SURGICAL HISTORY:  Past Surgical  History:  Procedure Laterality Date  . cataract Right 2011  . CIRCUMCISION    . COLONOSCOPY    . cortisone injections to back x 6    . ORIF ORBITAL FRACTURE Left 2000   2 fractures orbital platform    REVIEW OF SYSTEMS:  A comprehensive review of systems was negative except for: Constitutional: positive for fatigue   PHYSICAL EXAMINATION: General appearance: alert, cooperative, fatigued and no distress Head: Normocephalic, without obvious abnormality, atraumatic Neck: no adenopathy, no JVD, supple, symmetrical, trachea midline and thyroid not enlarged, symmetric, no  tenderness/mass/nodules Lymph nodes: Cervical, supraclavicular, and axillary nodes normal. Resp: clear to auscultation bilaterally Back: symmetric, no curvature. ROM normal. No CVA tenderness. Cardio: regular rate and rhythm, S1, S2 normal, no murmur, click, rub or gallop GI: soft, non-tender; bowel sounds normal; no masses,  no organomegaly Extremities: extremities normal, atraumatic, no cyanosis or edema Neurologic: Alert and oriented X 3, normal strength and tone. Normal symmetric reflexes. Normal coordination and gait  ECOG PERFORMANCE STATUS: 1 - Symptomatic but completely ambulatory  Blood pressure 113/60, pulse 66, temperature 98.6 F (37 C), temperature source Oral, resp. rate 18, height 6' (1.829 m), weight 174 lb 11.2 oz (79.2 kg), SpO2 99 %.  LABORATORY DATA: Lab Results  Component Value Date   WBC 1.5 (L) 05/18/2016   HGB 9.7 (L) 05/18/2016   HCT 29.2 (L) 05/18/2016   MCV 110.2 (H) 05/18/2016   PLT 68 (L) 05/18/2016      Chemistry      Component Value Date/Time   NA 138 04/27/2016 0948   K 4.5 04/27/2016 0948   CL 101 04/05/2016 1517   CO2 28 04/27/2016 0948   BUN 10.8 04/27/2016 0948   CREATININE 0.7 04/27/2016 0948      Component Value Date/Time   CALCIUM 9.0 04/27/2016 0948   ALKPHOS 60 04/27/2016 0948   AST 15 04/27/2016 0948   ALT 11 04/27/2016 0948   BILITOT 1.18 04/27/2016 0948       RADIOGRAPHIC STUDIES: No results found.  ASSESSMENT AND PLAN:  This is a very pleasant 82 years old white male with recently diagnosed myelodysplastic syndrome consistent with refractory anemia with excess blast.  He is currently undergoing systemic chemotherapy with Vidaza 75 MG/M2 for 5 days every 4 weeks is status post 3 cycles. He tolerated his treatment fairly well except for the persistent fatigue and pancytopenia secondary to his disease.  His CBC today showed persistent pancytopenia. I recommended for the patient to continue with supportive care for now. For  the refractory anemia, I would consider the patient for treatment with Aranesp 300 g subcutaneously every 3 weeks. For the lack of appetite and weight loss, he will continue on Marinol 2.5 mg by mouth twice a day. He would like to resume eating shellfish but I strongly advised the patient to have it cooked before eating. The patient would come back for follow-up visit in 3 months for reevaluation with repeat blood work. He was advised to call immediately if he has any significant fever or chills or any other signs of infection during this period. The patient voices understanding of current disease status and treatment options and is in agreement with the current care plan.  All questions were answered. The patient knows to call the clinic with any problems, questions or concerns. We can certainly see the patient much sooner if necessary.  Disclaimer: This note was dictated with voice recognition software. Similar sounding words can inadvertently be transcribed and may  not be corrected upon review.

## 2016-06-08 ENCOUNTER — Ambulatory Visit (HOSPITAL_BASED_OUTPATIENT_CLINIC_OR_DEPARTMENT_OTHER): Payer: Medicare Other

## 2016-06-08 ENCOUNTER — Other Ambulatory Visit (HOSPITAL_BASED_OUTPATIENT_CLINIC_OR_DEPARTMENT_OTHER): Payer: Medicare Other

## 2016-06-08 VITALS — BP 116/72 | HR 67 | Temp 97.8°F | Resp 20

## 2016-06-08 DIAGNOSIS — D469 Myelodysplastic syndrome, unspecified: Secondary | ICD-10-CM

## 2016-06-08 DIAGNOSIS — D539 Nutritional anemia, unspecified: Secondary | ICD-10-CM

## 2016-06-08 LAB — CBC WITH DIFFERENTIAL/PLATELET
BASO%: 1.4 % (ref 0.0–2.0)
BASOS ABS: 0 10*3/uL (ref 0.0–0.1)
EOS%: 0.2 % (ref 0.0–7.0)
Eosinophils Absolute: 0 10*3/uL (ref 0.0–0.5)
HEMATOCRIT: 33.6 % — AB (ref 38.4–49.9)
HGB: 10.9 g/dL — ABNORMAL LOW (ref 13.0–17.1)
LYMPH%: 63 % — AB (ref 14.0–49.0)
MCH: 36.9 pg — AB (ref 27.2–33.4)
MCHC: 32.5 g/dL (ref 32.0–36.0)
MCV: 113.5 fL — ABNORMAL HIGH (ref 79.3–98.0)
MONO#: 0.3 10*3/uL (ref 0.1–0.9)
MONO%: 14.9 % — AB (ref 0.0–14.0)
NEUT#: 0.4 10*3/uL — CL (ref 1.5–6.5)
NEUT%: 20.5 % — AB (ref 39.0–75.0)
Platelets: 82 10*3/uL — ABNORMAL LOW (ref 140–400)
RBC: 2.96 10*6/uL — AB (ref 4.20–5.82)
RDW: 22.9 % — ABNORMAL HIGH (ref 11.0–14.6)
WBC: 1.7 10*3/uL — ABNORMAL LOW (ref 4.0–10.3)
lymph#: 1.1 10*3/uL (ref 0.9–3.3)

## 2016-06-08 MED ORDER — TBO-FILGRASTIM 300 MCG/0.5ML ~~LOC~~ SOSY
300.0000 ug | PREFILLED_SYRINGE | Freq: Once | SUBCUTANEOUS | Status: AC
  Administered 2016-06-08: 300 ug via SUBCUTANEOUS
  Filled 2016-06-08: qty 0.5

## 2016-06-08 MED ORDER — DARBEPOETIN ALFA 300 MCG/0.6ML IJ SOSY
300.0000 ug | PREFILLED_SYRINGE | INTRAMUSCULAR | Status: DC
  Administered 2016-06-08: 300 ug via SUBCUTANEOUS
  Filled 2016-06-08: qty 0.6

## 2016-06-08 NOTE — Patient Instructions (Signed)
Darbepoetin Alfa injection What is this medicine? DARBEPOETIN ALFA (dar be POE e tin AL fa) helps your body make more red blood cells. It is used to treat anemia caused by chronic kidney failure and chemotherapy. This medicine may be used for other purposes; ask your health care provider or pharmacist if you have questions. What should I tell my health care provider before I take this medicine? They need to know if you have any of these conditions: -blood clotting disorders or history of blood clots -cancer patient not on chemotherapy -cystic fibrosis -heart disease, such as angina, heart failure, or a history of a heart attack -hemoglobin level of 12 g/dL or greater -high blood pressure -low levels of folate, iron, or vitamin B12 -seizures -an unusual or allergic reaction to darbepoetin, erythropoietin, albumin, hamster proteins, latex, other medicines, foods, dyes, or preservatives -pregnant or trying to get pregnant -breast-feeding How should I use this medicine? This medicine is for injection into a vein or under the skin. It is usually given by a health care professional in a hospital or clinic setting. If you get this medicine at home, you will be taught how to prepare and give this medicine. Do not shake the solution before you withdraw a dose. Use exactly as directed. Take your medicine at regular intervals. Do not take your medicine more often than directed. It is important that you put your used needles and syringes in a special sharps container. Do not put them in a trash can. If you do not have a sharps container, call your pharmacist or healthcare provider to get one. Talk to your pediatrician regarding the use of this medicine in children. While this medicine may be used in children as young as 1 year for selected conditions, precautions do apply. Overdosage: If you think you have taken too much of this medicine contact a poison control center or emergency room at once. NOTE:  This medicine is only for you. Do not share this medicine with others. What if I miss a dose? If you miss a dose, take it as soon as you can. If it is almost time for your next dose, take only that dose. Do not take double or extra doses. What may interact with this medicine? Do not take this medicine with any of the following medications: -epoetin alfa This list may not describe all possible interactions. Give your health care provider a list of all the medicines, herbs, non-prescription drugs, or dietary supplements you use. Also tell them if you smoke, drink alcohol, or use illegal drugs. Some items may interact with your medicine. What should I watch for while using this medicine? Visit your prescriber or health care professional for regular checks on your progress and for the needed blood tests and blood pressure measurements. It is especially important for the doctor to make sure your hemoglobin level is in the desired range, to limit the risk of potential side effects and to give you the best benefit. Keep all appointments for any recommended tests. Check your blood pressure as directed. Ask your doctor what your blood pressure should be and when you should contact him or her. As your body makes more red blood cells, you may need to take iron, folic acid, or vitamin B supplements. Ask your doctor or health care provider which products are right for you. If you have kidney disease continue dietary restrictions, even though this medication can make you feel better. Talk with your doctor or health care professional about the   foods you eat and the vitamins that you take. What side effects may I notice from receiving this medicine? Side effects that you should report to your doctor or health care professional as soon as possible: -allergic reactions like skin rash, itching or hives, swelling of the face, lips, or tongue -breathing problems -changes in vision -chest pain -confusion, trouble speaking  or understanding -feeling faint or lightheaded, falls -high blood pressure -muscle aches or pains -pain, swelling, warmth in the leg -rapid weight gain -severe headaches -sudden numbness or weakness of the face, arm or leg -trouble walking, dizziness, loss of balance or coordination -seizures (convulsions) -swelling of the ankles, feet, hands -unusually weak or tired Side effects that usually do not require medical attention (report to your doctor or health care professional if they continue or are bothersome): -diarrhea -fever, chills (flu-like symptoms) -headaches -nausea, vomiting -redness, stinging, or swelling at site where injected This list may not describe all possible side effects. Call your doctor for medical advice about side effects. You may report side effects to FDA at 1-800-FDA-1088. Where should I keep my medicine? Keep out of the reach of children. Store in a refrigerator between 2 and 8 degrees C (36 and 46 degrees F). Do not freeze. Do not shake. Throw away any unused portion if using a single-dose vial. Throw away any unused medicine after the expiration date. NOTE: This sheet is a summary. It may not cover all possible information. If you have questions about this medicine, talk to your doctor, pharmacist, or health care provider.    2016, [employer] Standard. (2008-08-28 10:23:57) Tbo-Filgrastim injection What is this medicine? TBO-FILGRASTIM (T B O fil GRA stim) is a granulocyte colony-stimulating factor that stimulates the growth of neutrophils, a type of white blood cell important in the body's fight against infection. It is used to reduce the incidence of fever and infection in patients with certain types of cancer who are receiving chemotherapy that affects the bone marrow. This medicine may be used for other purposes; ask your health care provider or pharmacist if you have questions. What should I tell my health care provider before I take this  medicine? They need to know if you have any of these conditions: -ongoing radiation therapy -sickle cell anemia -an unusual or allergic reaction to tbo-filgrastim, filgrastim, pegfilgrastim, other medicines, foods, dyes, or preservatives -pregnant or trying to get pregnant -breast-feeding How should I use this medicine? This medicine is for injection under the skin. If you get this medicine at home, you will be taught how to prepare and give this medicine. Refer to the Instructions for Use that come with your medication packaging. Use exactly as directed. Take your medicine at regular intervals. Do not take your medicine more often than directed. It is important that you put your used needles and syringes in a special sharps container. Do not put them in a trash can. If you do not have a sharps container, call your pharmacist or healthcare provider to get one. Talk to your pediatrician regarding the use of this medicine in children. Special care may be needed. Overdosage: If you think you have taken too much of this medicine contact a poison control center or emergency room at once. NOTE: This medicine is only for you. Do not share this medicine with others. What if I miss a dose? It is important not to miss your dose. Call your doctor or health care professional if you miss a dose. What may interact with this medicine? This medicine   may interact with the following medications: -medicines that may cause a release of neutrophils, such as lithium This list may not describe all possible interactions. Give your health care provider a list of all the medicines, herbs, non-prescription drugs, or dietary supplements you use. Also tell them if you smoke, drink alcohol, or use illegal drugs. Some items may interact with your medicine. What should I watch for while using this medicine? You may need blood work done while you are taking this medicine. What side effects may I notice from receiving this  medicine? Side effects that you should report to your doctor or health care professional as soon as possible: -allergic reactions like skin rash, itching or hives, swelling of the face, lips, or tongue -shortness of breath or breathing problems -fever -pain, redness, or irritation at site where injected -pinpoint red spots on the skin -stomach or side pain, or pain at the shoulder -swelling -tiredness -trouble passing urine Side effects that usually do not require medical attention (Report these to your doctor or health care professional if they continue or are bothersome.): -bone pain -muscle pain This list may not describe all possible side effects. Call your doctor for medical advice about side effects. You may report side effects to FDA at 1-800-FDA-1088. Where should I keep my medicine? Keep out of the reach of children. Store in a refrigerator between 2 and 8 degrees C (36 and 46 degrees F). Keep in carton to protect from light. Throw away this medicine if it is left out of the refrigerator for more than 5 consecutive days. Throw away any unused medicine after the expiration date. NOTE: This sheet is a summary. It may not cover all possible information. If you have questions about this medicine, talk to your doctor, pharmacist, or health care provider.    2016, [employer] Standard. (2014-01-04 11:52:29) Tbo-Filgrastim injection What is this medicine? TBO-FILGRASTIM (T B O fil GRA stim) is a granulocyte colony-stimulating factor that stimulates the growth of neutrophils, a type of white blood cell important in the body's fight against infection. It is used to reduce the incidence of fever and infection in patients with certain types of cancer who are receiving chemotherapy that affects the bone marrow. This medicine may be used for other purposes; ask your health care provider or pharmacist if you have questions. What should I tell my health care provider before I take this  medicine? They need to know if you have any of these conditions: -ongoing radiation therapy -sickle cell anemia -an unusual or allergic reaction to tbo-filgrastim, filgrastim, pegfilgrastim, other medicines, foods, dyes, or preservatives -pregnant or trying to get pregnant -breast-feeding How should I use this medicine? This medicine is for injection under the skin. If you get this medicine at home, you will be taught how to prepare and give this medicine. Refer to the Instructions for Use that come with your medication packaging. Use exactly as directed. Take your medicine at regular intervals. Do not take your medicine more often than directed. It is important that you put your used needles and syringes in a special sharps container. Do not put them in a trash can. If you do not have a sharps container, call your pharmacist or healthcare provider to get one. Talk to your pediatrician regarding the use of this medicine in children. Special care may be needed. Overdosage: If you think you have taken too much of this medicine contact a poison control center or emergency room at once. NOTE: This medicine is only  for you. Do not share this medicine with others. What if I miss a dose? It is important not to miss your dose. Call your doctor or health care professional if you miss a dose. What may interact with this medicine? This medicine may interact with the following medications: -medicines that may cause a release of neutrophils, such as lithium This list may not describe all possible interactions. Give your health care provider a list of all the medicines, herbs, non-prescription drugs, or dietary supplements you use. Also tell them if you smoke, drink alcohol, or use illegal drugs. Some items may interact with your medicine. What should I watch for while using this medicine? You may need blood work done while you are taking this medicine. What side effects may I notice from receiving this  medicine? Side effects that you should report to your doctor or health care professional as soon as possible: -allergic reactions like skin rash, itching or hives, swelling of the face, lips, or tongue -shortness of breath or breathing problems -fever -pain, redness, or irritation at site where injected -pinpoint red spots on the skin -stomach or side pain, or pain at the shoulder -swelling -tiredness -trouble passing urine Side effects that usually do not require medical attention (Report these to your doctor or health care professional if they continue or are bothersome.): -bone pain -muscle pain This list may not describe all possible side effects. Call your doctor for medical advice about side effects. You may report side effects to FDA at 1-800-FDA-1088. Where should I keep my medicine? Keep out of the reach of children. Store in a refrigerator between 2 and 8 degrees C (36 and 46 degrees F). Keep in carton to protect from light. Throw away this medicine if it is left out of the refrigerator for more than 5 consecutive days. Throw away any unused medicine after the expiration date. NOTE: This sheet is a summary. It may not cover all possible information. If you have questions about this medicine, talk to your doctor, pharmacist, or health care provider.    2016, [employer] Standard. (2014-01-04 11:52:29)

## 2016-06-29 ENCOUNTER — Other Ambulatory Visit: Payer: Self-pay | Admitting: Internal Medicine

## 2016-06-29 ENCOUNTER — Other Ambulatory Visit (HOSPITAL_BASED_OUTPATIENT_CLINIC_OR_DEPARTMENT_OTHER): Payer: Medicare Other

## 2016-06-29 ENCOUNTER — Ambulatory Visit (HOSPITAL_BASED_OUTPATIENT_CLINIC_OR_DEPARTMENT_OTHER): Payer: Medicare Other

## 2016-06-29 VITALS — BP 131/49 | HR 80 | Temp 98.2°F | Resp 18

## 2016-06-29 DIAGNOSIS — D469 Myelodysplastic syndrome, unspecified: Secondary | ICD-10-CM

## 2016-06-29 DIAGNOSIS — D539 Nutritional anemia, unspecified: Secondary | ICD-10-CM

## 2016-06-29 LAB — CBC WITH DIFFERENTIAL/PLATELET
BASO%: 1.4 % (ref 0.0–2.0)
BASOS ABS: 0 10*3/uL (ref 0.0–0.1)
EOS%: 0 % (ref 0.0–7.0)
Eosinophils Absolute: 0 10*3/uL (ref 0.0–0.5)
HEMATOCRIT: 32.2 % — AB (ref 38.4–49.9)
HEMOGLOBIN: 10.5 g/dL — AB (ref 13.0–17.1)
LYMPH#: 1.1 10*3/uL (ref 0.9–3.3)
LYMPH%: 66.8 % — ABNORMAL HIGH (ref 14.0–49.0)
MCH: 36.2 pg — ABNORMAL HIGH (ref 27.2–33.4)
MCHC: 32.7 g/dL (ref 32.0–36.0)
MCV: 110.8 fL — ABNORMAL HIGH (ref 79.3–98.0)
MONO#: 0.2 10*3/uL (ref 0.1–0.9)
MONO%: 12.7 % (ref 0.0–14.0)
NEUT#: 0.3 10*3/uL — CL (ref 1.5–6.5)
NEUT%: 19.1 % — ABNORMAL LOW (ref 39.0–75.0)
PLATELETS: 69 10*3/uL — AB (ref 140–400)
RBC: 2.91 10*6/uL — ABNORMAL LOW (ref 4.20–5.82)
RDW: 20.3 % — AB (ref 11.0–14.6)
WBC: 1.6 10*3/uL — ABNORMAL LOW (ref 4.0–10.3)

## 2016-06-29 MED ORDER — TBO-FILGRASTIM 300 MCG/0.5ML ~~LOC~~ SOSY
300.0000 ug | PREFILLED_SYRINGE | Freq: Once | SUBCUTANEOUS | Status: AC
  Administered 2016-06-29: 300 ug via SUBCUTANEOUS
  Filled 2016-06-29: qty 0.5

## 2016-06-29 MED ORDER — DARBEPOETIN ALFA 300 MCG/0.6ML IJ SOSY
300.0000 ug | PREFILLED_SYRINGE | INTRAMUSCULAR | Status: DC
Start: 2016-06-29 — End: 2016-06-29
  Administered 2016-06-29: 300 ug via SUBCUTANEOUS
  Filled 2016-06-29: qty 0.6

## 2016-06-29 NOTE — Patient Instructions (Signed)
Darbepoetin Alfa injection What is this medicine? DARBEPOETIN ALFA (dar be POE e tin AL fa) helps your body make more red blood cells. It is used to treat anemia caused by chronic kidney failure and chemotherapy. This medicine may be used for other purposes; ask your health care provider or pharmacist if you have questions. What should I tell my health care provider before I take this medicine? They need to know if you have any of these conditions: -blood clotting disorders or history of blood clots -cancer patient not on chemotherapy -cystic fibrosis -heart disease, such as angina, heart failure, or a history of a heart attack -hemoglobin level of 12 g/dL or greater -high blood pressure -low levels of folate, iron, or vitamin B12 -seizures -an unusual or allergic reaction to darbepoetin, erythropoietin, albumin, hamster proteins, latex, other medicines, foods, dyes, or preservatives -pregnant or trying to get pregnant -breast-feeding How should I use this medicine? This medicine is for injection into a vein or under the skin. It is usually given by a health care professional in a hospital or clinic setting. If you get this medicine at home, you will be taught how to prepare and give this medicine. Do not shake the solution before you withdraw a dose. Use exactly as directed. Take your medicine at regular intervals. Do not take your medicine more often than directed. It is important that you put your used needles and syringes in a special sharps container. Do not put them in a trash can. If you do not have a sharps container, call your pharmacist or healthcare provider to get one. Talk to your pediatrician regarding the use of this medicine in children. While this medicine may be used in children as young as 1 year for selected conditions, precautions do apply. Overdosage: If you think you have taken too much of this medicine contact a poison control center or emergency room at once. NOTE:  This medicine is only for you. Do not share this medicine with others. What if I miss a dose? If you miss a dose, take it as soon as you can. If it is almost time for your next dose, take only that dose. Do not take double or extra doses. What may interact with this medicine? Do not take this medicine with any of the following medications: -epoetin alfa This list may not describe all possible interactions. Give your health care provider a list of all the medicines, herbs, non-prescription drugs, or dietary supplements you use. Also tell them if you smoke, drink alcohol, or use illegal drugs. Some items may interact with your medicine. What should I watch for while using this medicine? Visit your prescriber or health care professional for regular checks on your progress and for the needed blood tests and blood pressure measurements. It is especially important for the doctor to make sure your hemoglobin level is in the desired range, to limit the risk of potential side effects and to give you the best benefit. Keep all appointments for any recommended tests. Check your blood pressure as directed. Ask your doctor what your blood pressure should be and when you should contact him or her. As your body makes more red blood cells, you may need to take iron, folic acid, or vitamin B supplements. Ask your doctor or health care provider which products are right for you. If you have kidney disease continue dietary restrictions, even though this medication can make you feel better. Talk with your doctor or health care professional about the   foods you eat and the vitamins that you take. What side effects may I notice from receiving this medicine? Side effects that you should report to your doctor or health care professional as soon as possible: -allergic reactions like skin rash, itching or hives, swelling of the face, lips, or tongue -breathing problems -changes in vision -chest pain -confusion, trouble speaking  or understanding -feeling faint or lightheaded, falls -high blood pressure -muscle aches or pains -pain, swelling, warmth in the leg -rapid weight gain -severe headaches -sudden numbness or weakness of the face, arm or leg -trouble walking, dizziness, loss of balance or coordination -seizures (convulsions) -swelling of the ankles, feet, hands -unusually weak or tired Side effects that usually do not require medical attention (report to your doctor or health care professional if they continue or are bothersome): -diarrhea -fever, chills (flu-like symptoms) -headaches -nausea, vomiting -redness, stinging, or swelling at site where injected This list may not describe all possible side effects. Call your doctor for medical advice about side effects. You may report side effects to FDA at 1-800-FDA-1088. Where should I keep my medicine? Keep out of the reach of children. Store in a refrigerator between 2 and 8 degrees C (36 and 46 degrees F). Do not freeze. Do not shake. Throw away any unused portion if using a single-dose vial. Throw away any unused medicine after the expiration date. NOTE: This sheet is a summary. It may not cover all possible information. If you have questions about this medicine, talk to your doctor, pharmacist, or health care provider.    2016, [employer] Standard. (2008-08-28 10:23:57) Tbo-Filgrastim injection What is this medicine? TBO-FILGRASTIM (T B O fil GRA stim) is a granulocyte colony-stimulating factor that stimulates the growth of neutrophils, a type of white blood cell important in the body's fight against infection. It is used to reduce the incidence of fever and infection in patients with certain types of cancer who are receiving chemotherapy that affects the bone marrow. This medicine may be used for other purposes; ask your health care provider or pharmacist if you have questions. What should I tell my health care provider before I take this  medicine? They need to know if you have any of these conditions: -ongoing radiation therapy -sickle cell anemia -an unusual or allergic reaction to tbo-filgrastim, filgrastim, pegfilgrastim, other medicines, foods, dyes, or preservatives -pregnant or trying to get pregnant -breast-feeding How should I use this medicine? This medicine is for injection under the skin. If you get this medicine at home, you will be taught how to prepare and give this medicine. Refer to the Instructions for Use that come with your medication packaging. Use exactly as directed. Take your medicine at regular intervals. Do not take your medicine more often than directed. It is important that you put your used needles and syringes in a special sharps container. Do not put them in a trash can. If you do not have a sharps container, call your pharmacist or healthcare provider to get one. Talk to your pediatrician regarding the use of this medicine in children. Special care may be needed. Overdosage: If you think you have taken too much of this medicine contact a poison control center or emergency room at once. NOTE: This medicine is only for you. Do not share this medicine with others. What if I miss a dose? It is important not to miss your dose. Call your doctor or health care professional if you miss a dose. What may interact with this medicine? This medicine   may interact with the following medications: -medicines that may cause a release of neutrophils, such as lithium This list may not describe all possible interactions. Give your health care provider a list of all the medicines, herbs, non-prescription drugs, or dietary supplements you use. Also tell them if you smoke, drink alcohol, or use illegal drugs. Some items may interact with your medicine. What should I watch for while using this medicine? You may need blood work done while you are taking this medicine. What side effects may I notice from receiving this  medicine? Side effects that you should report to your doctor or health care professional as soon as possible: -allergic reactions like skin rash, itching or hives, swelling of the face, lips, or tongue -shortness of breath or breathing problems -fever -pain, redness, or irritation at site where injected -pinpoint red spots on the skin -stomach or side pain, or pain at the shoulder -swelling -tiredness -trouble passing urine Side effects that usually do not require medical attention (Report these to your doctor or health care professional if they continue or are bothersome.): -bone pain -muscle pain This list may not describe all possible side effects. Call your doctor for medical advice about side effects. You may report side effects to FDA at 1-800-FDA-1088. Where should I keep my medicine? Keep out of the reach of children. Store in a refrigerator between 2 and 8 degrees C (36 and 46 degrees F). Keep in carton to protect from light. Throw away this medicine if it is left out of the refrigerator for more than 5 consecutive days. Throw away any unused medicine after the expiration date. NOTE: This sheet is a summary. It may not cover all possible information. If you have questions about this medicine, talk to your doctor, pharmacist, or health care provider.    2016, [employer] Standard. (2014-01-04 11:52:29) Tbo-Filgrastim injection What is this medicine? TBO-FILGRASTIM (T B O fil GRA stim) is a granulocyte colony-stimulating factor that stimulates the growth of neutrophils, a type of white blood cell important in the body's fight against infection. It is used to reduce the incidence of fever and infection in patients with certain types of cancer who are receiving chemotherapy that affects the bone marrow. This medicine may be used for other purposes; ask your health care provider or pharmacist if you have questions. What should I tell my health care provider before I take this  medicine? They need to know if you have any of these conditions: -ongoing radiation therapy -sickle cell anemia -an unusual or allergic reaction to tbo-filgrastim, filgrastim, pegfilgrastim, other medicines, foods, dyes, or preservatives -pregnant or trying to get pregnant -breast-feeding How should I use this medicine? This medicine is for injection under the skin. If you get this medicine at home, you will be taught how to prepare and give this medicine. Refer to the Instructions for Use that come with your medication packaging. Use exactly as directed. Take your medicine at regular intervals. Do not take your medicine more often than directed. It is important that you put your used needles and syringes in a special sharps container. Do not put them in a trash can. If you do not have a sharps container, call your pharmacist or healthcare provider to get one. Talk to your pediatrician regarding the use of this medicine in children. Special care may be needed. Overdosage: If you think you have taken too much of this medicine contact a poison control center or emergency room at once. NOTE: This medicine is only  for you. Do not share this medicine with others. What if I miss a dose? It is important not to miss your dose. Call your doctor or health care professional if you miss a dose. What may interact with this medicine? This medicine may interact with the following medications: -medicines that may cause a release of neutrophils, such as lithium This list may not describe all possible interactions. Give your health care provider a list of all the medicines, herbs, non-prescription drugs, or dietary supplements you use. Also tell them if you smoke, drink alcohol, or use illegal drugs. Some items may interact with your medicine. What should I watch for while using this medicine? You may need blood work done while you are taking this medicine. What side effects may I notice from receiving this  medicine? Side effects that you should report to your doctor or health care professional as soon as possible: -allergic reactions like skin rash, itching or hives, swelling of the face, lips, or tongue -shortness of breath or breathing problems -fever -pain, redness, or irritation at site where injected -pinpoint red spots on the skin -stomach or side pain, or pain at the shoulder -swelling -tiredness -trouble passing urine Side effects that usually do not require medical attention (Report these to your doctor or health care professional if they continue or are bothersome.): -bone pain -muscle pain This list may not describe all possible side effects. Call your doctor for medical advice about side effects. You may report side effects to FDA at 1-800-FDA-1088. Where should I keep my medicine? Keep out of the reach of children. Store in a refrigerator between 2 and 8 degrees C (36 and 46 degrees F). Keep in carton to protect from light. Throw away this medicine if it is left out of the refrigerator for more than 5 consecutive days. Throw away any unused medicine after the expiration date. NOTE: This sheet is a summary. It may not cover all possible information. If you have questions about this medicine, talk to your doctor, pharmacist, or health care provider.    2016, [employer] Standard. (2014-01-04 11:52:29)

## 2016-07-20 ENCOUNTER — Other Ambulatory Visit: Payer: Self-pay | Admitting: Internal Medicine

## 2016-07-20 ENCOUNTER — Other Ambulatory Visit (HOSPITAL_BASED_OUTPATIENT_CLINIC_OR_DEPARTMENT_OTHER): Payer: Medicare Other

## 2016-07-20 ENCOUNTER — Ambulatory Visit (HOSPITAL_BASED_OUTPATIENT_CLINIC_OR_DEPARTMENT_OTHER): Payer: Medicare Other

## 2016-07-20 VITALS — BP 116/64 | HR 69 | Temp 98.6°F | Resp 20

## 2016-07-20 DIAGNOSIS — D469 Myelodysplastic syndrome, unspecified: Secondary | ICD-10-CM

## 2016-07-20 DIAGNOSIS — D539 Nutritional anemia, unspecified: Secondary | ICD-10-CM

## 2016-07-20 LAB — CBC WITH DIFFERENTIAL/PLATELET
BASO%: 0.6 % (ref 0.0–2.0)
BASOS ABS: 0 10*3/uL (ref 0.0–0.1)
EOS ABS: 0 10*3/uL (ref 0.0–0.5)
EOS%: 0.6 % (ref 0.0–7.0)
HCT: 31.6 % — ABNORMAL LOW (ref 38.4–49.9)
HGB: 10.5 g/dL — ABNORMAL LOW (ref 13.0–17.1)
LYMPH%: 66.9 % — AB (ref 14.0–49.0)
MCH: 35.4 pg — ABNORMAL HIGH (ref 27.2–33.4)
MCHC: 33.2 g/dL (ref 32.0–36.0)
MCV: 106.4 fL — AB (ref 79.3–98.0)
MONO#: 0.3 10*3/uL (ref 0.1–0.9)
MONO%: 16 % — AB (ref 0.0–14.0)
NEUT#: 0.3 10*3/uL — CL (ref 1.5–6.5)
NEUT%: 15.9 % — ABNORMAL LOW (ref 39.0–75.0)
PLATELETS: 66 10*3/uL — AB (ref 140–400)
RBC: 2.97 10*6/uL — AB (ref 4.20–5.82)
RDW: 18.6 % — ABNORMAL HIGH (ref 11.0–14.6)
WBC: 1.6 10*3/uL — AB (ref 4.0–10.3)
lymph#: 1.1 10*3/uL (ref 0.9–3.3)
nRBC: 1 % — ABNORMAL HIGH (ref 0–0)

## 2016-07-20 MED ORDER — TBO-FILGRASTIM 300 MCG/0.5ML ~~LOC~~ SOSY
300.0000 ug | PREFILLED_SYRINGE | Freq: Once | SUBCUTANEOUS | Status: AC
  Administered 2016-07-20: 300 ug via SUBCUTANEOUS
  Filled 2016-07-20: qty 0.5

## 2016-07-20 MED ORDER — TBO-FILGRASTIM 300 MCG/0.5ML ~~LOC~~ SOSY
300.0000 ug | PREFILLED_SYRINGE | Freq: Once | SUBCUTANEOUS | Status: DC
  Filled 2016-07-20: qty 0.5

## 2016-07-20 MED ORDER — DARBEPOETIN ALFA 300 MCG/0.6ML IJ SOSY
300.0000 ug | PREFILLED_SYRINGE | INTRAMUSCULAR | Status: DC
Start: 2016-07-20 — End: 2016-07-20
  Administered 2016-07-20: 300 ug via SUBCUTANEOUS
  Filled 2016-07-20: qty 0.6

## 2016-07-20 NOTE — Patient Instructions (Signed)
Darbepoetin Alfa injection What is this medicine? DARBEPOETIN ALFA (dar be POE e tin AL fa) helps your body make more red blood cells. It is used to treat anemia caused by chronic kidney failure and chemotherapy. This medicine may be used for other purposes; ask your health care provider or pharmacist if you have questions. What should I tell my health care provider before I take this medicine? They need to know if you have any of these conditions: -blood clotting disorders or history of blood clots -cancer patient not on chemotherapy -cystic fibrosis -heart disease, such as angina, heart failure, or a history of a heart attack -hemoglobin level of 12 g/dL or greater -high blood pressure -low levels of folate, iron, or vitamin B12 -seizures -an unusual or allergic reaction to darbepoetin, erythropoietin, albumin, hamster proteins, latex, other medicines, foods, dyes, or preservatives -pregnant or trying to get pregnant -breast-feeding How should I use this medicine? This medicine is for injection into a vein or under the skin. It is usually given by a health care professional in a hospital or clinic setting. If you get this medicine at home, you will be taught how to prepare and give this medicine. Do not shake the solution before you withdraw a dose. Use exactly as directed. Take your medicine at regular intervals. Do not take your medicine more often than directed. It is important that you put your used needles and syringes in a special sharps container. Do not put them in a trash can. If you do not have a sharps container, call your pharmacist or healthcare provider to get one. Talk to your pediatrician regarding the use of this medicine in children. While this medicine may be used in children as young as 1 year for selected conditions, precautions do apply. Overdosage: If you think you have taken too much of this medicine contact a poison control center or emergency room at once. NOTE:  This medicine is only for you. Do not share this medicine with others. What if I miss a dose? If you miss a dose, take it as soon as you can. If it is almost time for your next dose, take only that dose. Do not take double or extra doses. What may interact with this medicine? Do not take this medicine with any of the following medications: -epoetin alfa This list may not describe all possible interactions. Give your health care provider a list of all the medicines, herbs, non-prescription drugs, or dietary supplements you use. Also tell them if you smoke, drink alcohol, or use illegal drugs. Some items may interact with your medicine. What should I watch for while using this medicine? Visit your prescriber or health care professional for regular checks on your progress and for the needed blood tests and blood pressure measurements. It is especially important for the doctor to make sure your hemoglobin level is in the desired range, to limit the risk of potential side effects and to give you the best benefit. Keep all appointments for any recommended tests. Check your blood pressure as directed. Ask your doctor what your blood pressure should be and when you should contact him or her. As your body makes more red blood cells, you may need to take iron, folic acid, or vitamin B supplements. Ask your doctor or health care provider which products are right for you. If you have kidney disease continue dietary restrictions, even though this medication can make you feel better. Talk with your doctor or health care professional about the   foods you eat and the vitamins that you take. What side effects may I notice from receiving this medicine? Side effects that you should report to your doctor or health care professional as soon as possible: -allergic reactions like skin rash, itching or hives, swelling of the face, lips, or tongue -breathing problems -changes in vision -chest pain -confusion, trouble speaking  or understanding -feeling faint or lightheaded, falls -high blood pressure -muscle aches or pains -pain, swelling, warmth in the leg -rapid weight gain -severe headaches -sudden numbness or weakness of the face, arm or leg -trouble walking, dizziness, loss of balance or coordination -seizures (convulsions) -swelling of the ankles, feet, hands -unusually weak or tired Side effects that usually do not require medical attention (report to your doctor or health care professional if they continue or are bothersome): -diarrhea -fever, chills (flu-like symptoms) -headaches -nausea, vomiting -redness, stinging, or swelling at site where injected This list may not describe all possible side effects. Call your doctor for medical advice about side effects. You may report side effects to FDA at 1-800-FDA-1088. Where should I keep my medicine? Keep out of the reach of children. Store in a refrigerator between 2 and 8 degrees C (36 and 46 degrees F). Do not freeze. Do not shake. Throw away any unused portion if using a single-dose vial. Throw away any unused medicine after the expiration date. NOTE: This sheet is a summary. It may not cover all possible information. If you have questions about this medicine, talk to your doctor, pharmacist, or health care provider.    2016, [employer] Standard. (2008-08-28 10:23:57) Tbo-Filgrastim injection What is this medicine? TBO-FILGRASTIM (T B O fil GRA stim) is a granulocyte colony-stimulating factor that stimulates the growth of neutrophils, a type of white blood cell important in the body's fight against infection. It is used to reduce the incidence of fever and infection in patients with certain types of cancer who are receiving chemotherapy that affects the bone marrow. This medicine may be used for other purposes; ask your health care provider or pharmacist if you have questions. What should I tell my health care provider before I take this  medicine? They need to know if you have any of these conditions: -ongoing radiation therapy -sickle cell anemia -an unusual or allergic reaction to tbo-filgrastim, filgrastim, pegfilgrastim, other medicines, foods, dyes, or preservatives -pregnant or trying to get pregnant -breast-feeding How should I use this medicine? This medicine is for injection under the skin. If you get this medicine at home, you will be taught how to prepare and give this medicine. Refer to the Instructions for Use that come with your medication packaging. Use exactly as directed. Take your medicine at regular intervals. Do not take your medicine more often than directed. It is important that you put your used needles and syringes in a special sharps container. Do not put them in a trash can. If you do not have a sharps container, call your pharmacist or healthcare provider to get one. Talk to your pediatrician regarding the use of this medicine in children. Special care may be needed. Overdosage: If you think you have taken too much of this medicine contact a poison control center or emergency room at once. NOTE: This medicine is only for you. Do not share this medicine with others. What if I miss a dose? It is important not to miss your dose. Call your doctor or health care professional if you miss a dose. What may interact with this medicine? This medicine   may interact with the following medications: -medicines that may cause a release of neutrophils, such as lithium This list may not describe all possible interactions. Give your health care provider a list of all the medicines, herbs, non-prescription drugs, or dietary supplements you use. Also tell them if you smoke, drink alcohol, or use illegal drugs. Some items may interact with your medicine. What should I watch for while using this medicine? You may need blood work done while you are taking this medicine. What side effects may I notice from receiving this  medicine? Side effects that you should report to your doctor or health care professional as soon as possible: -allergic reactions like skin rash, itching or hives, swelling of the face, lips, or tongue -shortness of breath or breathing problems -fever -pain, redness, or irritation at site where injected -pinpoint red spots on the skin -stomach or side pain, or pain at the shoulder -swelling -tiredness -trouble passing urine Side effects that usually do not require medical attention (Report these to your doctor or health care professional if they continue or are bothersome.): -bone pain -muscle pain This list may not describe all possible side effects. Call your doctor for medical advice about side effects. You may report side effects to FDA at 1-800-FDA-1088. Where should I keep my medicine? Keep out of the reach of children. Store in a refrigerator between 2 and 8 degrees C (36 and 46 degrees F). Keep in carton to protect from light. Throw away this medicine if it is left out of the refrigerator for more than 5 consecutive days. Throw away any unused medicine after the expiration date. NOTE: This sheet is a summary. It may not cover all possible information. If you have questions about this medicine, talk to your doctor, pharmacist, or health care provider.    2016, [employer] Standard. (2014-01-04 11:52:29) Tbo-Filgrastim injection What is this medicine? TBO-FILGRASTIM (T B O fil GRA stim) is a granulocyte colony-stimulating factor that stimulates the growth of neutrophils, a type of white blood cell important in the body's fight against infection. It is used to reduce the incidence of fever and infection in patients with certain types of cancer who are receiving chemotherapy that affects the bone marrow. This medicine may be used for other purposes; ask your health care provider or pharmacist if you have questions. What should I tell my health care provider before I take this  medicine? They need to know if you have any of these conditions: -ongoing radiation therapy -sickle cell anemia -an unusual or allergic reaction to tbo-filgrastim, filgrastim, pegfilgrastim, other medicines, foods, dyes, or preservatives -pregnant or trying to get pregnant -breast-feeding How should I use this medicine? This medicine is for injection under the skin. If you get this medicine at home, you will be taught how to prepare and give this medicine. Refer to the Instructions for Use that come with your medication packaging. Use exactly as directed. Take your medicine at regular intervals. Do not take your medicine more often than directed. It is important that you put your used needles and syringes in a special sharps container. Do not put them in a trash can. If you do not have a sharps container, call your pharmacist or healthcare provider to get one. Talk to your pediatrician regarding the use of this medicine in children. Special care may be needed. Overdosage: If you think you have taken too much of this medicine contact a poison control center or emergency room at once. NOTE: This medicine is only  for you. Do not share this medicine with others. What if I miss a dose? It is important not to miss your dose. Call your doctor or health care professional if you miss a dose. What may interact with this medicine? This medicine may interact with the following medications: -medicines that may cause a release of neutrophils, such as lithium This list may not describe all possible interactions. Give your health care provider a list of all the medicines, herbs, non-prescription drugs, or dietary supplements you use. Also tell them if you smoke, drink alcohol, or use illegal drugs. Some items may interact with your medicine. What should I watch for while using this medicine? You may need blood work done while you are taking this medicine. What side effects may I notice from receiving this  medicine? Side effects that you should report to your doctor or health care professional as soon as possible: -allergic reactions like skin rash, itching or hives, swelling of the face, lips, or tongue -shortness of breath or breathing problems -fever -pain, redness, or irritation at site where injected -pinpoint red spots on the skin -stomach or side pain, or pain at the shoulder -swelling -tiredness -trouble passing urine Side effects that usually do not require medical attention (Report these to your doctor or health care professional if they continue or are bothersome.): -bone pain -muscle pain This list may not describe all possible side effects. Call your doctor for medical advice about side effects. You may report side effects to FDA at 1-800-FDA-1088. Where should I keep my medicine? Keep out of the reach of children. Store in a refrigerator between 2 and 8 degrees C (36 and 46 degrees F). Keep in carton to protect from light. Throw away this medicine if it is left out of the refrigerator for more than 5 consecutive days. Throw away any unused medicine after the expiration date. NOTE: This sheet is a summary. It may not cover all possible information. If you have questions about this medicine, talk to your doctor, pharmacist, or health care provider.    2016, [employer] Standard. (2014-01-04 11:52:29)

## 2016-08-10 ENCOUNTER — Ambulatory Visit (HOSPITAL_BASED_OUTPATIENT_CLINIC_OR_DEPARTMENT_OTHER): Payer: Medicare Other | Admitting: Internal Medicine

## 2016-08-10 ENCOUNTER — Telehealth: Payer: Self-pay | Admitting: Internal Medicine

## 2016-08-10 ENCOUNTER — Other Ambulatory Visit (HOSPITAL_BASED_OUTPATIENT_CLINIC_OR_DEPARTMENT_OTHER): Payer: Medicare Other

## 2016-08-10 ENCOUNTER — Ambulatory Visit: Payer: Medicare Other

## 2016-08-10 ENCOUNTER — Encounter: Payer: Self-pay | Admitting: Internal Medicine

## 2016-08-10 VITALS — BP 114/59 | HR 80 | Temp 97.9°F | Resp 17 | Ht 72.0 in | Wt 181.3 lb

## 2016-08-10 DIAGNOSIS — D4620 Refractory anemia with excess of blasts, unspecified: Secondary | ICD-10-CM

## 2016-08-10 DIAGNOSIS — R530 Neoplastic (malignant) related fatigue: Secondary | ICD-10-CM

## 2016-08-10 DIAGNOSIS — D708 Other neutropenia: Secondary | ICD-10-CM

## 2016-08-10 DIAGNOSIS — D469 Myelodysplastic syndrome, unspecified: Secondary | ICD-10-CM

## 2016-08-10 DIAGNOSIS — D539 Nutritional anemia, unspecified: Secondary | ICD-10-CM

## 2016-08-10 DIAGNOSIS — D61818 Other pancytopenia: Secondary | ICD-10-CM | POA: Diagnosis not present

## 2016-08-10 DIAGNOSIS — D72819 Decreased white blood cell count, unspecified: Secondary | ICD-10-CM

## 2016-08-10 LAB — COMPREHENSIVE METABOLIC PANEL
ALT: 12 U/L (ref 0–55)
AST: 15 U/L (ref 5–34)
Albumin: 4.1 g/dL (ref 3.5–5.0)
Alkaline Phosphatase: 58 U/L (ref 40–150)
Anion Gap: 9 mEq/L (ref 3–11)
BILIRUBIN TOTAL: 1.05 mg/dL (ref 0.20–1.20)
BUN: 15.2 mg/dL (ref 7.0–26.0)
CHLORIDE: 103 meq/L (ref 98–109)
CO2: 25 meq/L (ref 22–29)
Calcium: 9.3 mg/dL (ref 8.4–10.4)
Creatinine: 0.8 mg/dL (ref 0.7–1.3)
EGFR: 83 mL/min/{1.73_m2} — AB (ref >90)
GLUCOSE: 127 mg/dL (ref 70–140)
POTASSIUM: 4.1 meq/L (ref 3.5–5.1)
SODIUM: 138 meq/L (ref 136–145)
TOTAL PROTEIN: 7.4 g/dL (ref 6.4–8.3)

## 2016-08-10 LAB — CBC WITH DIFFERENTIAL/PLATELET
BASO%: 1.1 % (ref 0.0–2.0)
BASOS ABS: 0 10*3/uL (ref 0.0–0.1)
EOS ABS: 0 10*3/uL (ref 0.0–0.5)
EOS%: 0 % (ref 0.0–7.0)
HEMATOCRIT: 32.1 % — AB (ref 38.4–49.9)
HEMOGLOBIN: 10.8 g/dL — AB (ref 13.0–17.1)
LYMPH#: 1.1 10*3/uL (ref 0.9–3.3)
LYMPH%: 63.6 % — ABNORMAL HIGH (ref 14.0–49.0)
MCH: 35.5 pg — ABNORMAL HIGH (ref 27.2–33.4)
MCHC: 33.6 g/dL (ref 32.0–36.0)
MCV: 105.6 fL — AB (ref 79.3–98.0)
MONO#: 0.2 10*3/uL (ref 0.1–0.9)
MONO%: 13.6 % (ref 0.0–14.0)
NEUT#: 0.4 10*3/uL — CL (ref 1.5–6.5)
NEUT%: 21.7 % — ABNORMAL LOW (ref 39.0–75.0)
NRBC: 2 % — AB (ref 0–0)
PLATELETS: 76 10*3/uL — AB (ref 140–400)
RBC: 3.04 10*6/uL — ABNORMAL LOW (ref 4.20–5.82)
RDW: 18.7 % — AB (ref 11.0–14.6)
WBC: 1.8 10*3/uL — ABNORMAL LOW (ref 4.0–10.3)

## 2016-08-10 LAB — LACTATE DEHYDROGENASE: LDH: 118 U/L — ABNORMAL LOW (ref 125–245)

## 2016-08-10 NOTE — Telephone Encounter (Signed)
Labs and injections scheduled throughout end of February 2018. Appointments scheduled per 08/10/16 los. AVS report and appointment schedule given to patient per 08/10/16 los.

## 2016-08-10 NOTE — Progress Notes (Signed)
[place] Telephone:(336) [phone]   Fax:(336) [place], [address] [place]  [phone]  DIAGNOSIS: 1)  myelodysplastic syndrome consistent with refractory anemia with excess blast diagnosed in December 2016 2)  History of ITP  PRIOR THERAPY: Vidaza 75 mg/m2 IV for 5 days every 4 weeks. Status post 3 cycles.  CURRENT THERAPY:  1) Supportive care with Aranesp 300 g subcutaneously every 3 weeks in addition to Granix 300 mcg subcutaneously for neutropenia as needed.  INTERVAL HISTORY: Dean Stanley 82 y.o. male returns to the clinic today for follow-up visit accompanied by his wife. The patient is doing fine today with no concerning complaints except for mild fatigue. He is currently on Aranesp and Granix as needed. He denied having any fever or chills. He has no bleeding issues. He denied having any significant chest pain, cough or hemoptysis. He has repeat CBC performed earlier today and he is here for evaluation and discussion of his lab results.  MEDICAL HISTORY: Past Medical History:  Diagnosis Date  . Anemia   . Cancer (HCC)    myelodysplastic syndrome  . Complication of anesthesia    Reaction to anesthesia " things went haywire around me"  . Do not resuscitate 11/04/2015  . Hearing impairment    wears hearing left ear  . History of ITP 1960  . Hypothyroidism   . Meniere's syndrome 1980s  . Port catheter in place 12/23/2015    ALLERGIES:  is allergic to hydrocodone and oxycodone.  MEDICATIONS:  Current Outpatient Prescriptions  Medication Sig Dispense Refill  . Darbepoetin Alfa (ARANESP) 300 MCG/0.6ML SOSY injection Inject 300 mcg into the skin every 30 (thirty) days.    Marland Kitchen desmopressin (DDAVP) 0.2 MG tablet Take 0.2 mg by mouth daily.    . finasteride (PROPECIA) 1 MG tablet Take 1 mg by mouth daily.    Marland Kitchen levothyroxine (SYNTHROID, LEVOTHROID) 200 MCG tablet Take 200 mcg by mouth daily.     . Lutein 40 MG CAPS Take 40 mg by mouth daily.    . Multiple Vitamins-Minerals (CENTRUM ADULTS PO) Take 1 tablet by mouth daily.     Marland Kitchen senna-docusate (SENNA S) 8.6-50 MG tablet Take 2 tablets by mouth every 6 (six) hours as needed for mild constipation.    . Tbo-Filgrastim (GRANIX) 300 MCG/0.5ML SOSY injection Inject 300 mcg into the skin every 30 (thirty) days.    . traMADol (ULTRAM) 50 MG tablet Take 50 mg by mouth 3 (three) times daily as needed for moderate pain or severe pain.     Marland Kitchen triamcinolone (KENALOG) 0.025 % cream Apply 1 application topically 2 (two) times daily.     Current Facility-Administered Medications  Medication Dose Route Frequency Provider Last Rate Last Dose  . Tbo-Filgrastim (GRANIX) injection 300 mcg  300 mcg Subcutaneous Once Curt Bears, MD       Facility-Administered Medications Ordered in Other Visits  Medication Dose Route Frequency Provider Last Rate Last Dose  . azaCITIDine (VIDAZA) 150 mg in lactated ringers 45 mL chemo infusion  75 mg/m2 (Treatment Plan Recorded) Intravenous Daily Curt Bears, MD   150 mg at 12/02/15 1428  . Darbepoetin Alfa (ARANESP) injection 300 mcg  300 mcg Subcutaneous Q21 days Curt Bears, MD   300 mcg at 05/18/16 1112  . heparin lock flush 100 unit/mL  500 Units Intracatheter Once PRN Curt Bears, MD      . sodium chloride 0.9 % injection  10 mL  10 mL Intracatheter PRN Curt Bears, MD        SURGICAL HISTORY:  Past Surgical History:  Procedure Laterality Date  . cataract Right 2011  . CIRCUMCISION    . COLONOSCOPY    . cortisone injections to back x 6    . ORIF ORBITAL FRACTURE Left 2000   2 fractures orbital platform    REVIEW OF SYSTEMS:  A comprehensive review of systems was negative except for: Constitutional: positive for fatigue   PHYSICAL EXAMINATION: General appearance: alert, cooperative, fatigued and no distress Head: Normocephalic, without obvious abnormality, atraumatic Neck: no adenopathy,  no JVD, supple, symmetrical, trachea midline and thyroid not enlarged, symmetric, no tenderness/mass/nodules Lymph nodes: Cervical, supraclavicular, and axillary nodes normal. Resp: clear to auscultation bilaterally Back: symmetric, no curvature. ROM normal. No CVA tenderness. Cardio: regular rate and rhythm, S1, S2 normal, no murmur, click, rub or gallop GI: soft, non-tender; bowel sounds normal; no masses,  no organomegaly Extremities: extremities normal, atraumatic, no cyanosis or edema Neurologic: Alert and oriented X 3, normal strength and tone. Normal symmetric reflexes. Normal coordination and gait  ECOG PERFORMANCE STATUS: 1 - Symptomatic but completely ambulatory  Blood pressure (!) 114/59, pulse 80, temperature 97.9 F (36.6 C), temperature source Oral, resp. rate 17, height 6' (1.829 m), weight 181 lb 4.8 oz (82.2 kg), SpO2 100 %.  LABORATORY DATA: Lab Results  Component Value Date   WBC 1.8 (L) 08/10/2016   HGB 10.8 (L) 08/10/2016   HCT 32.1 (L) 08/10/2016   MCV 105.6 (H) 08/10/2016   PLT 76 (L) 08/10/2016      Chemistry      Component Value Date/Time   NA 138 04/27/2016 0948   K 4.5 04/27/2016 0948   CL 101 04/05/2016 1517   CO2 28 04/27/2016 0948   BUN 10.8 04/27/2016 0948   CREATININE 0.7 04/27/2016 0948      Component Value Date/Time   CALCIUM 9.0 04/27/2016 0948   ALKPHOS 60 04/27/2016 0948   AST 15 04/27/2016 0948   ALT 11 04/27/2016 0948   BILITOT 1.18 04/27/2016 0948       RADIOGRAPHIC STUDIES: No results found.  ASSESSMENT AND PLAN:  This is a very pleasant 82 years old white male with recently diagnosed myelodysplastic syndrome consistent with refractory anemia with excess blast.  He is currently undergoing systemic chemotherapy with Vidaza 75 MG/M2 for 5 days every 4 weeks is status post 3 cycles. He tolerated his treatment fairly well except for the persistent fatigue and pancytopenia secondary to his disease.  His CBC today showed persistent  but stable pancytopenia. I recommended for the patient to continue with supportive care for now. For the refractory anemia, I would consider the patient for treatment with Aranesp 300 g subcutaneously every 3 weeks if he meets the parameter with hemoglobin less than 10.0 G/DL. The patient would come back for follow-up visit in 3 months for reevaluation with repeat blood work. He was advised to call immediately if he has any significant fever or chills or any other signs of infection during this period. The patient voices understanding of current disease status and treatment options and is in agreement with the current care plan.  All questions were answered. The patient knows to call the clinic with any problems, questions or concerns. We can certainly see the patient much sooner if necessary.  Disclaimer: This note was dictated with voice recognition software. Similar sounding words can inadvertently be transcribed and may not be corrected  upon review.

## 2016-08-31 ENCOUNTER — Ambulatory Visit (HOSPITAL_BASED_OUTPATIENT_CLINIC_OR_DEPARTMENT_OTHER): Payer: Medicare Other

## 2016-08-31 ENCOUNTER — Other Ambulatory Visit (HOSPITAL_BASED_OUTPATIENT_CLINIC_OR_DEPARTMENT_OTHER): Payer: Medicare Other

## 2016-08-31 VITALS — BP 111/66 | HR 71 | Temp 98.1°F | Resp 18

## 2016-08-31 DIAGNOSIS — D469 Myelodysplastic syndrome, unspecified: Secondary | ICD-10-CM

## 2016-08-31 DIAGNOSIS — D539 Nutritional anemia, unspecified: Secondary | ICD-10-CM

## 2016-08-31 LAB — CBC WITH DIFFERENTIAL/PLATELET
BASO%: 1.1 % (ref 0.0–2.0)
BASOS ABS: 0 10*3/uL (ref 0.0–0.1)
EOS ABS: 0 10*3/uL (ref 0.0–0.5)
EOS%: 0 % (ref 0.0–7.0)
HEMATOCRIT: 29.5 % — AB (ref 38.4–49.9)
HEMOGLOBIN: 9.9 g/dL — AB (ref 13.0–17.1)
LYMPH#: 1.2 10*3/uL (ref 0.9–3.3)
LYMPH%: 65.4 % — ABNORMAL HIGH (ref 14.0–49.0)
MCH: 34.7 pg — AB (ref 27.2–33.4)
MCHC: 33.6 g/dL (ref 32.0–36.0)
MCV: 103.5 fL — AB (ref 79.3–98.0)
MONO#: 0.3 10*3/uL (ref 0.1–0.9)
MONO%: 14.9 % — ABNORMAL HIGH (ref 0.0–14.0)
NEUT#: 0.4 10*3/uL — CL (ref 1.5–6.5)
NEUT%: 18.6 % — AB (ref 39.0–75.0)
PLATELETS: 84 10*3/uL — AB (ref 140–400)
RBC: 2.85 10*6/uL — ABNORMAL LOW (ref 4.20–5.82)
RDW: 18.3 % — ABNORMAL HIGH (ref 11.0–14.6)
WBC: 1.9 10*3/uL — ABNORMAL LOW (ref 4.0–10.3)
nRBC: 3 % — ABNORMAL HIGH (ref 0–0)

## 2016-08-31 MED ORDER — DARBEPOETIN ALFA 300 MCG/0.6ML IJ SOSY
300.0000 ug | PREFILLED_SYRINGE | Freq: Once | INTRAMUSCULAR | Status: AC
  Administered 2016-08-31: 300 ug via SUBCUTANEOUS
  Filled 2016-08-31: qty 0.6

## 2016-08-31 NOTE — Patient Instructions (Signed)

## 2016-09-22 ENCOUNTER — Other Ambulatory Visit (HOSPITAL_BASED_OUTPATIENT_CLINIC_OR_DEPARTMENT_OTHER): Payer: Medicare Other

## 2016-09-22 ENCOUNTER — Ambulatory Visit (HOSPITAL_BASED_OUTPATIENT_CLINIC_OR_DEPARTMENT_OTHER): Payer: Medicare Other

## 2016-09-22 VITALS — BP 100/53 | HR 71 | Temp 98.1°F | Resp 20

## 2016-09-22 DIAGNOSIS — D469 Myelodysplastic syndrome, unspecified: Secondary | ICD-10-CM

## 2016-09-22 DIAGNOSIS — D539 Nutritional anemia, unspecified: Secondary | ICD-10-CM

## 2016-09-22 LAB — CBC WITH DIFFERENTIAL/PLATELET
BASO%: 0.5 % (ref 0.0–2.0)
Basophils Absolute: 0 10*3/uL (ref 0.0–0.1)
EOS ABS: 0 10*3/uL (ref 0.0–0.5)
EOS%: 0 % (ref 0.0–7.0)
HEMATOCRIT: 31.5 % — AB (ref 38.4–49.9)
HEMOGLOBIN: 10.6 g/dL — AB (ref 13.0–17.1)
LYMPH#: 1.4 10*3/uL (ref 0.9–3.3)
LYMPH%: 65.2 % — AB (ref 14.0–49.0)
MCH: 35 pg — ABNORMAL HIGH (ref 27.2–33.4)
MCHC: 33.7 g/dL (ref 32.0–36.0)
MCV: 104 fL — ABNORMAL HIGH (ref 79.3–98.0)
MONO#: 0.3 10*3/uL (ref 0.1–0.9)
MONO%: 11.3 % (ref 0.0–14.0)
NEUT%: 23 % — AB (ref 39.0–75.0)
NEUTROS ABS: 0.5 10*3/uL — AB (ref 1.5–6.5)
PLATELETS: 92 10*3/uL — AB (ref 140–400)
RBC: 3.03 10*6/uL — ABNORMAL LOW (ref 4.20–5.82)
RDW: 19.2 % — ABNORMAL HIGH (ref 11.0–14.6)
WBC: 2.2 10*3/uL — ABNORMAL LOW (ref 4.0–10.3)
nRBC: 2 % — ABNORMAL HIGH (ref 0–0)

## 2016-09-22 MED ORDER — DARBEPOETIN ALFA 300 MCG/0.6ML IJ SOSY
300.0000 ug | PREFILLED_SYRINGE | Freq: Once | INTRAMUSCULAR | Status: AC
  Administered 2016-09-22: 300 ug via SUBCUTANEOUS
  Filled 2016-09-22: qty 0.6

## 2016-09-22 NOTE — Patient Instructions (Signed)

## 2016-09-25 ENCOUNTER — Telehealth: Payer: Self-pay | Admitting: Medical Oncology

## 2016-09-25 NOTE — Telephone Encounter (Signed)
I left a message to return my call. 

## 2016-09-25 NOTE — Telephone Encounter (Signed)
[place] phone rang then stopped ringing.

## 2016-09-25 NOTE — Telephone Encounter (Signed)
States pt received aranesp on 09/22/16 and asking what was hgb ? Information given

## 2016-09-29 NOTE — Telephone Encounter (Signed)
Pamala Hurry aware that pharmacy is working on [employer].

## 2016-10-12 ENCOUNTER — Other Ambulatory Visit (HOSPITAL_BASED_OUTPATIENT_CLINIC_OR_DEPARTMENT_OTHER): Payer: Medicare Other

## 2016-10-12 ENCOUNTER — Ambulatory Visit: Payer: Medicare Other

## 2016-10-12 DIAGNOSIS — D539 Nutritional anemia, unspecified: Secondary | ICD-10-CM

## 2016-10-12 DIAGNOSIS — D708 Other neutropenia: Secondary | ICD-10-CM

## 2016-10-12 DIAGNOSIS — D469 Myelodysplastic syndrome, unspecified: Secondary | ICD-10-CM | POA: Diagnosis present

## 2016-10-12 LAB — CBC WITH DIFFERENTIAL/PLATELET
BASO%: 1 % (ref 0.0–2.0)
Basophils Absolute: 0 10*3/uL (ref 0.0–0.1)
EOS ABS: 0 10*3/uL (ref 0.0–0.5)
EOS%: 0.4 % (ref 0.0–7.0)
HCT: 32.9 % — ABNORMAL LOW (ref 38.4–49.9)
HGB: 11 g/dL — ABNORMAL LOW (ref 13.0–17.1)
LYMPH%: 57.4 % — AB (ref 14.0–49.0)
MCH: 35.1 pg — ABNORMAL HIGH (ref 27.2–33.4)
MCHC: 33.3 g/dL (ref 32.0–36.0)
MCV: 105.4 fL — AB (ref 79.3–98.0)
MONO#: 0.3 10*3/uL (ref 0.1–0.9)
MONO%: 15.8 % — AB (ref 0.0–14.0)
NEUT%: 25.4 % — ABNORMAL LOW (ref 39.0–75.0)
NEUTROS ABS: 0.6 10*3/uL — AB (ref 1.5–6.5)
Platelets: 115 10*3/uL — ABNORMAL LOW (ref 140–400)
RBC: 3.12 10*6/uL — AB (ref 4.20–5.82)
RDW: 20.8 % — ABNORMAL HIGH (ref 11.0–14.6)
WBC: 2.2 10*3/uL — AB (ref 4.0–10.3)
lymph#: 1.3 10*3/uL (ref 0.9–3.3)

## 2016-10-12 LAB — IRON AND TIBC
%SAT: 50 % (ref 20–55)
IRON: 93 ug/dL (ref 42–163)
TIBC: 184 ug/dL — ABNORMAL LOW (ref 202–409)
UIBC: 91 ug/dL — ABNORMAL LOW (ref 117–376)

## 2016-10-12 LAB — FERRITIN: Ferritin: 610 ng/ml — ABNORMAL HIGH (ref 22–316)

## 2016-10-12 NOTE — Progress Notes (Signed)
Hemoglobin at 11.0 on lab today. No injection needed per protocol order. Copy of lab and current schedule given to pt . Pt instructed to follow current schedule and to call office if issues occur. Pt verbalized understanding of instructions

## 2016-11-02 ENCOUNTER — Other Ambulatory Visit: Payer: Self-pay | Admitting: *Deleted

## 2016-11-02 ENCOUNTER — Other Ambulatory Visit (HOSPITAL_BASED_OUTPATIENT_CLINIC_OR_DEPARTMENT_OTHER): Payer: Medicare Other

## 2016-11-02 ENCOUNTER — Ambulatory Visit (HOSPITAL_BASED_OUTPATIENT_CLINIC_OR_DEPARTMENT_OTHER): Payer: Self-pay

## 2016-11-02 DIAGNOSIS — D539 Nutritional anemia, unspecified: Secondary | ICD-10-CM

## 2016-11-02 DIAGNOSIS — D469 Myelodysplastic syndrome, unspecified: Secondary | ICD-10-CM

## 2016-11-02 LAB — CBC WITH DIFFERENTIAL/PLATELET
BASO%: 1 % (ref 0.0–2.0)
BASOS ABS: 0 10*3/uL (ref 0.0–0.1)
EOS ABS: 0 10*3/uL (ref 0.0–0.5)
EOS%: 0.5 % (ref 0.0–7.0)
HEMATOCRIT: 32.4 % — AB (ref 38.4–49.9)
HGB: 10.6 g/dL — ABNORMAL LOW (ref 13.0–17.1)
LYMPH%: 62.3 % — AB (ref 14.0–49.0)
MCH: 33.9 pg — ABNORMAL HIGH (ref 27.2–33.4)
MCHC: 32.7 g/dL (ref 32.0–36.0)
MCV: 103.5 fL — ABNORMAL HIGH (ref 79.3–98.0)
MONO#: 0.4 10*3/uL (ref 0.1–0.9)
MONO%: 18.1 % — ABNORMAL HIGH (ref 0.0–14.0)
NEUT%: 18.1 % — ABNORMAL LOW (ref 39.0–75.0)
NEUTROS ABS: 0.4 10*3/uL — AB (ref 1.5–6.5)
PLATELETS: 108 10*3/uL — AB (ref 140–400)
RBC: 3.13 10*6/uL — ABNORMAL LOW (ref 4.20–5.82)
RDW: 18.8 % — AB (ref 11.0–14.6)
WBC: 2 10*3/uL — AB (ref 4.0–10.3)
lymph#: 1.3 10*3/uL (ref 0.9–3.3)
nRBC: 3 % — ABNORMAL HIGH (ref 0–0)

## 2016-11-02 LAB — TECHNOLOGIST REVIEW

## 2016-11-02 MED ORDER — DARBEPOETIN ALFA 300 MCG/0.6ML IJ SOSY: 300.0000 ug | PREFILLED_SYRINGE | Freq: Once | INTRAMUSCULAR | Status: DC

## 2016-11-02 NOTE — Progress Notes (Signed)
Pt didn't meet parameters to receive Aranesp. Hgb 10.6 P.Cates LPN

## 2016-11-23 ENCOUNTER — Other Ambulatory Visit: Payer: Medicare Other

## 2016-11-23 ENCOUNTER — Ambulatory Visit: Payer: Medicare Other

## 2016-11-23 ENCOUNTER — Other Ambulatory Visit: Payer: Self-pay | Admitting: Medical Oncology

## 2016-11-23 ENCOUNTER — Telehealth: Payer: Self-pay | Admitting: Internal Medicine

## 2016-11-23 ENCOUNTER — Other Ambulatory Visit (HOSPITAL_BASED_OUTPATIENT_CLINIC_OR_DEPARTMENT_OTHER): Payer: Medicare Other

## 2016-11-23 ENCOUNTER — Ambulatory Visit (HOSPITAL_BASED_OUTPATIENT_CLINIC_OR_DEPARTMENT_OTHER): Payer: Medicare Other | Admitting: Internal Medicine

## 2016-11-23 ENCOUNTER — Encounter: Payer: Self-pay | Admitting: Internal Medicine

## 2016-11-23 VITALS — BP 118/92 | HR 83 | Temp 98.6°F | Resp 18 | Ht 72.0 in | Wt 184.1 lb

## 2016-11-23 DIAGNOSIS — D469 Myelodysplastic syndrome, unspecified: Secondary | ICD-10-CM | POA: Diagnosis present

## 2016-11-23 DIAGNOSIS — D708 Other neutropenia: Secondary | ICD-10-CM

## 2016-11-23 DIAGNOSIS — D539 Nutritional anemia, unspecified: Secondary | ICD-10-CM

## 2016-11-23 LAB — CBC WITH DIFFERENTIAL/PLATELET
BASO%: 1.1 % (ref 0.0–2.0)
Basophils Absolute: 0 10*3/uL (ref 0.0–0.1)
EOS%: 0 % (ref 0.0–7.0)
Eosinophils Absolute: 0 10*3/uL (ref 0.0–0.5)
HEMATOCRIT: 29.9 % — AB (ref 38.4–49.9)
HGB: 10.1 g/dL — ABNORMAL LOW (ref 13.0–17.1)
LYMPH#: 1.1 10*3/uL (ref 0.9–3.3)
LYMPH%: 61 % — ABNORMAL HIGH (ref 14.0–49.0)
MCH: 34.5 pg — ABNORMAL HIGH (ref 27.2–33.4)
MCHC: 33.8 g/dL (ref 32.0–36.0)
MCV: 102 fL — ABNORMAL HIGH (ref 79.3–98.0)
MONO#: 0.3 10*3/uL (ref 0.1–0.9)
MONO%: 18.2 % — ABNORMAL HIGH (ref 0.0–14.0)
NEUT%: 19.7 % — ABNORMAL LOW (ref 39.0–75.0)
NEUTROS ABS: 0.4 10*3/uL — AB (ref 1.5–6.5)
NRBC: 4 % — AB (ref 0–0)
Platelets: 116 10*3/uL — ABNORMAL LOW (ref 140–400)
RBC: 2.93 10*6/uL — ABNORMAL LOW (ref 4.20–5.82)
RDW: 19 % — AB (ref 11.0–14.6)
WBC: 1.9 10*3/uL — AB (ref 4.0–10.3)

## 2016-11-23 LAB — COMPREHENSIVE METABOLIC PANEL
ALT: 13 U/L (ref 0–55)
AST: 15 U/L (ref 5–34)
Albumin: 4.3 g/dL (ref 3.5–5.0)
Alkaline Phosphatase: 46 U/L (ref 40–150)
Anion Gap: 7 mEq/L (ref 3–11)
BUN: 14.5 mg/dL (ref 7.0–26.0)
CALCIUM: 9.5 mg/dL (ref 8.4–10.4)
CHLORIDE: 104 meq/L (ref 98–109)
CO2: 25 meq/L (ref 22–29)
Creatinine: 0.9 mg/dL (ref 0.7–1.3)
EGFR: 80 mL/min/{1.73_m2} — ABNORMAL LOW (ref >90)
Glucose: 101 mg/dl (ref 70–140)
Potassium: 4.3 mEq/L (ref 3.5–5.1)
Sodium: 136 mEq/L (ref 136–145)
Total Bilirubin: 1.05 mg/dL (ref 0.20–1.20)
Total Protein: 7.2 g/dL (ref 6.4–8.3)

## 2016-11-23 NOTE — Telephone Encounter (Signed)
Appointments scheduled per 2/26 LOS. Patient given AVS report and calendars with future scheduled appointments. °

## 2016-11-23 NOTE — Progress Notes (Signed)
[place] Telephone:([phone]   Fax:(336) [place], [address] [place] [phone]  DIAGNOSIS: 1)  myelodysplastic syndrome consistent with refractory anemia with excess blast diagnosed in December 2016 2)  History of ITP  PRIOR THERAPY: Vidaza 75 mg/m2 IV for 5 days every 4 weeks. Status post 3 cycles.  CURRENT THERAPY:  1) Supportive care with Aranesp 300 g subcutaneously every 3 weeks in addition to Granix 300 mcg subcutaneously for neutropenia as needed.  INTERVAL HISTORY: Dean Stanley 83 y.o. male came to the clinic today accompanied by his wife for follow-up visit. The patient is feeling the same with no significant change since his last visit 3 months ago. He continues to have mild fatigue. His currently on Aranesp 300 g subcutaneously every 3 weeks as needed for anemia. He has not received any Granix in the last few months. He denied having any fever or chills. He has no nausea, vomiting, diarrhea or constipation. He has no significant weight loss or night sweats. He is here today for evaluation with repeat blood work.  MEDICAL HISTORY: Past Medical History:  Diagnosis Date  . Anemia   . Cancer (HCC)    myelodysplastic syndrome  . Complication of anesthesia    Reaction to anesthesia " things went haywire around me"  . Do not resuscitate 11/04/2015  . Hearing impairment    wears hearing left ear  . History of ITP 1960  . Hypothyroidism   . Meniere's syndrome 1980s  . Port catheter in place 12/23/2015    ALLERGIES:  is allergic to hydrocodone and oxycodone.  MEDICATIONS:  Current Outpatient Prescriptions  Medication Sig Dispense Refill  . Darbepoetin Alfa (ARANESP) 300 MCG/0.6ML SOSY injection Inject 300 mcg into the skin every 30 (thirty) days.    Marland Kitchen desmopressin (DDAVP) 0.2 MG tablet Take 0.2 mg by mouth daily.    . finasteride (PROPECIA) 1 MG tablet Take 1 mg by mouth  daily.    Marland Kitchen levothyroxine (SYNTHROID, LEVOTHROID) 200 MCG tablet Take 200 mcg by mouth daily.    . Lutein 40 MG CAPS Take 40 mg by mouth daily.    . Multiple Vitamins-Minerals (CENTRUM ADULTS PO) Take 1 tablet by mouth daily.     Marland Kitchen senna-docusate (SENNA S) 8.6-50 MG tablet Take 2 tablets by mouth every 6 (six) hours as needed for mild constipation.    . Tbo-Filgrastim (GRANIX) 300 MCG/0.5ML SOSY injection Inject 300 mcg into the skin every 30 (thirty) days.    . traMADol (ULTRAM) 50 MG tablet Take 50 mg by mouth 3 (three) times daily as needed for moderate pain or severe pain.     Marland Kitchen triamcinolone (KENALOG) 0.025 % cream Apply 1 application topically 2 (two) times daily.     No current facility-administered medications for this visit.    Facility-Administered Medications Ordered in Other Visits  Medication Dose Route Frequency Provider Last Rate Last Dose  . azaCITIDine (VIDAZA) 150 mg in lactated ringers 45 mL chemo infusion  75 mg/m2 (Treatment Plan Recorded) Intravenous Daily Curt Bears, MD   150 mg at 12/02/15 1428  . heparin lock flush 100 unit/mL  500 Units Intracatheter Once PRN Curt Bears, MD      . sodium chloride 0.9 % injection 10 mL  10 mL Intracatheter PRN Curt Bears, MD        SURGICAL HISTORY:  Past Surgical History:  Procedure Laterality Date  . cataract  Right 2011  . CIRCUMCISION    . COLONOSCOPY    . cortisone injections to back x 6    . ORIF ORBITAL FRACTURE Left 2000   2 fractures orbital platform    REVIEW OF SYSTEMS:  A comprehensive review of systems was negative except for: Constitutional: positive for fatigue   PHYSICAL EXAMINATION: General appearance: alert, cooperative, fatigued and no distress Head: Normocephalic, without obvious abnormality, atraumatic Neck: no adenopathy, no JVD, supple, symmetrical, trachea midline and thyroid not enlarged, symmetric, no tenderness/mass/nodules Lymph nodes: Cervical, supraclavicular, and axillary nodes  normal. Resp: clear to auscultation bilaterally Back: symmetric, no curvature. ROM normal. No CVA tenderness. Cardio: regular rate and rhythm, S1, S2 normal, no murmur, click, rub or gallop GI: soft, non-tender; bowel sounds normal; no masses,  no organomegaly Extremities: extremities normal, atraumatic, no cyanosis or edema  ECOG PERFORMANCE STATUS: 1 - Symptomatic but completely ambulatory  Blood pressure (!) 118/92, pulse 83, temperature 98.6 F (37 C), temperature source Oral, resp. rate 18, height 6' (1.829 m), weight 184 lb 1.6 oz (83.5 kg), SpO2 98 %.  LABORATORY DATA: Lab Results  Component Value Date   WBC 1.9 (L) 11/23/2016   HGB 10.1 (L) 11/23/2016   HCT 29.9 (L) 11/23/2016   MCV 102.0 (H) 11/23/2016   PLT 116 (L) 11/23/2016      Chemistry      Component Value Date/Time   NA 138 08/10/2016 1248   K 4.1 08/10/2016 1248   CL 101 04/05/2016 1517   CO2 25 08/10/2016 1248   BUN 15.2 08/10/2016 1248   CREATININE 0.8 08/10/2016 1248      Component Value Date/Time   CALCIUM 9.3 08/10/2016 1248   ALKPHOS 58 08/10/2016 1248   AST 15 08/10/2016 1248   ALT 12 08/10/2016 1248   BILITOT 1.05 08/10/2016 1248       RADIOGRAPHIC STUDIES: No results found.  ASSESSMENT AND PLAN:   This is a very pleasant 83 years old white male with myelodysplastic syndrome consistent with refractory anemia with excess blasts. He was treated in the past with systemic chemotherapy with Vidaza 3 cycles. He has been on observation and supportive care since that time. His hemoglobin and hematocrit are stable low but acceptable compared to the prior blood work. I discussed the lab result with the patient today. I recommended for him to continue with the supportive care of treatment with Aranesp every 3 weeks as needed. We will consider him for treatment with Granix if the patient has any concern for neutropenic fever. He would come back for follow-up visit in 3 months for evaluation and repeat  blood work. The patient was advised to call immediately if he has any concerning symptoms in the interval. The patient voices understanding of current disease status and treatment options and is in agreement with the current care plan.  All questions were answered. The patient knows to call the clinic with any problems, questions or concerns. We can certainly see the patient much sooner if necessary. I spent 10 minutes counseling the patient face to face. The total time spent in the appointment was 15 minutes.  Disclaimer: This note was dictated with voice recognition software. Similar sounding words can inadvertently be transcribed and may not be corrected upon review.

## 2016-12-11 ENCOUNTER — Other Ambulatory Visit: Payer: Self-pay | Admitting: Medical Oncology

## 2016-12-11 DIAGNOSIS — D469 Myelodysplastic syndrome, unspecified: Secondary | ICD-10-CM

## 2016-12-14 ENCOUNTER — Ambulatory Visit: Payer: Medicare Other

## 2016-12-14 ENCOUNTER — Other Ambulatory Visit (HOSPITAL_BASED_OUTPATIENT_CLINIC_OR_DEPARTMENT_OTHER): Payer: Medicare Other

## 2016-12-14 DIAGNOSIS — D469 Myelodysplastic syndrome, unspecified: Secondary | ICD-10-CM

## 2016-12-14 LAB — COMPREHENSIVE METABOLIC PANEL
ALBUMIN: 4.8 g/dL (ref 3.5–5.0)
ALK PHOS: 47 U/L (ref 40–150)
ALT: 18 U/L (ref 0–55)
ANION GAP: 11 meq/L (ref 3–11)
AST: 13 U/L (ref 5–34)
BILIRUBIN TOTAL: 1.35 mg/dL — AB (ref 0.20–1.20)
BUN: 18.8 mg/dL (ref 7.0–26.0)
CALCIUM: 10 mg/dL (ref 8.4–10.4)
CO2: 25 meq/L (ref 22–29)
Chloride: 105 mEq/L (ref 98–109)
Creatinine: 0.9 mg/dL (ref 0.7–1.3)
EGFR: 79 mL/min/{1.73_m2} — AB (ref >90)
Glucose: 104 mg/dl (ref 70–140)
Potassium: 4.5 mEq/L (ref 3.5–5.1)
Sodium: 141 mEq/L (ref 136–145)
TOTAL PROTEIN: 8 g/dL (ref 6.4–8.3)

## 2016-12-14 LAB — CBC WITH DIFFERENTIAL/PLATELET
BASO%: 1.3 % (ref 0.0–2.0)
BASOS ABS: 0 10*3/uL (ref 0.0–0.1)
EOS%: 0 % (ref 0.0–7.0)
Eosinophils Absolute: 0 10*3/uL (ref 0.0–0.5)
HCT: 33.6 % — ABNORMAL LOW (ref 38.4–49.9)
HEMOGLOBIN: 11.4 g/dL — AB (ref 13.0–17.1)
LYMPH#: 0.9 10*3/uL (ref 0.9–3.3)
LYMPH%: 28.6 % (ref 14.0–49.0)
MCH: 35 pg — ABNORMAL HIGH (ref 27.2–33.4)
MCHC: 33.9 g/dL (ref 32.0–36.0)
MCV: 103.1 fL — ABNORMAL HIGH (ref 79.3–98.0)
MONO#: 0.5 10*3/uL (ref 0.1–0.9)
MONO%: 14.9 % — AB (ref 0.0–14.0)
NEUT%: 55.2 % (ref 39.0–75.0)
NEUTROS ABS: 1.7 10*3/uL (ref 1.5–6.5)
NRBC: 7 % — AB (ref 0–0)
PLATELETS: 159 10*3/uL (ref 140–400)
RBC: 3.26 10*6/uL — AB (ref 4.20–5.82)
RDW: 20 % — AB (ref 11.0–14.6)
WBC: 3.2 10*3/uL — AB (ref 4.0–10.3)

## 2016-12-14 NOTE — Progress Notes (Signed)
Hgb  11.4  Today.  Pt did not meet parameters for Aranesp injection.  Explanations given to pt along with copy of lab results in the lobby.  Pt voiced understanding, and aware of next return appts.

## 2016-12-28 ENCOUNTER — Other Ambulatory Visit: Payer: Self-pay | Admitting: Orthopedic Surgery

## 2016-12-28 DIAGNOSIS — G8929 Other chronic pain: Secondary | ICD-10-CM

## 2016-12-28 DIAGNOSIS — M545 Low back pain: Principal | ICD-10-CM

## 2016-12-29 ENCOUNTER — Telehealth: Payer: Self-pay | Admitting: Internal Medicine

## 2016-12-29 NOTE — Telephone Encounter (Signed)
Unable to reach patient - left message with appt date and time. r/s 4/9 appt to 4/10 per patient request.

## 2016-12-29 NOTE — Telephone Encounter (Signed)
Needs to change his appointment on 4/9 due to having a procedure done that morning.  He would like to change it to 4/10 if possible.  Please call patient with new appointment

## 2017-01-04 ENCOUNTER — Ambulatory Visit
Admission: RE | Admit: 2017-01-04 | Discharge: 2017-01-04 | Disposition: A | Discharge status: Home / Self Care | Payer: Medicare Other | Source: Ambulatory Visit | Attending: Orthopedic Surgery | Admitting: Orthopedic Surgery

## 2017-01-04 ENCOUNTER — Ambulatory Visit: Payer: Medicare Other

## 2017-01-04 ENCOUNTER — Other Ambulatory Visit: Payer: Medicare Other

## 2017-01-04 DIAGNOSIS — G8929 Other chronic pain: Secondary | ICD-10-CM

## 2017-01-04 DIAGNOSIS — M545 Low back pain: Principal | ICD-10-CM

## 2017-01-04 MED ORDER — IOPAMIDOL (ISOVUE-M 200) INJECTION 41%
1.0000 mL | Freq: Once | INTRAMUSCULAR | Status: AC
  Administered 2017-01-04: 1 mL via EPIDURAL

## 2017-01-04 MED ORDER — METHYLPREDNISOLONE ACETATE 40 MG/ML INJ SUSP (RADIOLOG
120.0000 mg | Freq: Once | INTRAMUSCULAR | Status: AC
  Administered 2017-01-04: 120 mg via EPIDURAL

## 2017-01-04 NOTE — Discharge Instructions (Signed)

## 2017-01-05 ENCOUNTER — Ambulatory Visit: Payer: Medicare Other

## 2017-01-05 ENCOUNTER — Other Ambulatory Visit: Payer: Self-pay | Admitting: Medical Oncology

## 2017-01-05 ENCOUNTER — Other Ambulatory Visit (HOSPITAL_BASED_OUTPATIENT_CLINIC_OR_DEPARTMENT_OTHER): Payer: Medicare Other

## 2017-01-05 DIAGNOSIS — D469 Myelodysplastic syndrome, unspecified: Secondary | ICD-10-CM

## 2017-01-05 LAB — CBC WITH DIFFERENTIAL/PLATELET
BASO%: 0.4 % (ref 0.0–2.0)
BASOS ABS: 0 10*3/uL (ref 0.0–0.1)
EOS%: 0 % (ref 0.0–7.0)
Eosinophils Absolute: 0 10*3/uL (ref 0.0–0.5)
HCT: 32.8 % — ABNORMAL LOW (ref 38.4–49.9)
HGB: 11.1 g/dL — ABNORMAL LOW (ref 13.0–17.1)
LYMPH%: 40.2 % (ref 14.0–49.0)
MCH: 34.8 pg — AB (ref 27.2–33.4)
MCHC: 33.8 g/dL (ref 32.0–36.0)
MCV: 102.8 fL — AB (ref 79.3–98.0)
MONO#: 0.3 10*3/uL (ref 0.1–0.9)
MONO%: 12.9 % (ref 0.0–14.0)
NEUT#: 1.2 10*3/uL — ABNORMAL LOW (ref 1.5–6.5)
NEUT%: 46.5 % (ref 39.0–75.0)
Platelets: 130 10*3/uL — ABNORMAL LOW (ref 140–400)
RBC: 3.19 10*6/uL — AB (ref 4.20–5.82)
RDW: 20 % — AB (ref 11.0–14.6)
WBC: 2.6 10*3/uL — ABNORMAL LOW (ref 4.0–10.3)
lymph#: 1.1 10*3/uL (ref 0.9–3.3)
nRBC: 3 % — ABNORMAL HIGH (ref 0–0)

## 2017-01-05 NOTE — Progress Notes (Signed)
Hemoglobin noted at 11.1 on lab today. No injection needed per protocol order. Pt given current copy of labs and schedule. Instructed to follow schedule and to call office if issues occur. Pt verbalized  Understanding of instructions.

## 2017-01-22 ENCOUNTER — Other Ambulatory Visit: Payer: Self-pay | Admitting: Medical Oncology

## 2017-01-25 ENCOUNTER — Other Ambulatory Visit (HOSPITAL_BASED_OUTPATIENT_CLINIC_OR_DEPARTMENT_OTHER): Payer: Medicare Other

## 2017-01-25 ENCOUNTER — Ambulatory Visit: Payer: Medicare Other

## 2017-01-25 DIAGNOSIS — D539 Nutritional anemia, unspecified: Secondary | ICD-10-CM | POA: Diagnosis not present

## 2017-01-25 DIAGNOSIS — D708 Other neutropenia: Secondary | ICD-10-CM

## 2017-01-25 DIAGNOSIS — D469 Myelodysplastic syndrome, unspecified: Secondary | ICD-10-CM

## 2017-01-25 LAB — COMPREHENSIVE METABOLIC PANEL
ALT: 15 U/L (ref 0–55)
ANION GAP: 8 meq/L (ref 3–11)
AST: 18 U/L (ref 5–34)
Albumin: 4.4 g/dL (ref 3.5–5.0)
Alkaline Phosphatase: 46 U/L (ref 40–150)
BUN: 13.8 mg/dL (ref 7.0–26.0)
CALCIUM: 9.3 mg/dL (ref 8.4–10.4)
CHLORIDE: 105 meq/L (ref 98–109)
CO2: 27 mEq/L (ref 22–29)
Creatinine: 0.8 mg/dL (ref 0.7–1.3)
EGFR: 82 mL/min/{1.73_m2} — AB (ref >90)
Glucose: 103 mg/dl (ref 70–140)
POTASSIUM: 4.2 meq/L (ref 3.5–5.1)
Sodium: 140 mEq/L (ref 136–145)
Total Bilirubin: 1.29 mg/dL — ABNORMAL HIGH (ref 0.20–1.20)
Total Protein: 7.3 g/dL (ref 6.4–8.3)

## 2017-01-25 LAB — CBC WITH DIFFERENTIAL/PLATELET
BASO%: 1.6 % (ref 0.0–2.0)
Basophils Absolute: 0 10*3/uL (ref 0.0–0.1)
EOS%: 1.1 % (ref 0.0–7.0)
Eosinophils Absolute: 0 10*3/uL (ref 0.0–0.5)
HCT: 31.2 % — ABNORMAL LOW (ref 38.4–49.9)
HEMOGLOBIN: 10.5 g/dL — AB (ref 13.0–17.1)
LYMPH%: 55.6 % — AB (ref 14.0–49.0)
MCH: 34.9 pg — ABNORMAL HIGH (ref 27.2–33.4)
MCHC: 33.7 g/dL (ref 32.0–36.0)
MCV: 103.7 fL — AB (ref 79.3–98.0)
MONO#: 0.3 10*3/uL (ref 0.1–0.9)
MONO%: 16.4 % — AB (ref 0.0–14.0)
NEUT%: 25.3 % — ABNORMAL LOW (ref 39.0–75.0)
NEUTROS ABS: 0.5 10*3/uL — AB (ref 1.5–6.5)
Platelets: 122 10*3/uL — ABNORMAL LOW (ref 140–400)
RBC: 3.01 10*6/uL — AB (ref 4.20–5.82)
RDW: 20.2 % — AB (ref 11.0–14.6)
WBC: 1.9 10*3/uL — AB (ref 4.0–10.3)
lymph#: 1.1 10*3/uL (ref 0.9–3.3)

## 2017-01-25 LAB — LACTATE DEHYDROGENASE: LDH: 154 U/L (ref 125–245)

## 2017-01-25 LAB — IRON AND TIBC
%SAT: 47 % (ref 20–55)
Iron: 81 ug/dL (ref 42–163)
TIBC: 171 ug/dL — AB (ref 202–409)
UIBC: 90 ug/dL — AB (ref 117–376)

## 2017-01-25 LAB — FERRITIN: Ferritin: 654 ng/ml — ABNORMAL HIGH (ref 22–316)

## 2017-01-25 LAB — TECHNOLOGIST REVIEW

## 2017-01-25 NOTE — Progress Notes (Signed)
Hemoglobin noted at 10.5 today. No Aranesp injection needed per order protocol. Pt given copy of labs and current schedule. Pt instructed to follow schedule and call office should issues occur.

## 2017-01-29 ENCOUNTER — Emergency Department (HOSPITAL_COMMUNITY)
Admission: EM | Admit: 2017-01-29 | Discharge: 2017-01-29 | Disposition: A | Discharge status: Home / Self Care | Payer: Medicare Other | Attending: Emergency Medicine | Admitting: Emergency Medicine

## 2017-01-29 DIAGNOSIS — Y929 Unspecified place or not applicable: Secondary | ICD-10-CM | POA: Diagnosis not present

## 2017-01-29 DIAGNOSIS — Z8589 Personal history of malignant neoplasm of other organs and systems: Secondary | ICD-10-CM | POA: Diagnosis not present

## 2017-01-29 DIAGNOSIS — E039 Hypothyroidism, unspecified: Secondary | ICD-10-CM | POA: Insufficient documentation

## 2017-01-29 DIAGNOSIS — Z79899 Other long term (current) drug therapy: Secondary | ICD-10-CM | POA: Diagnosis not present

## 2017-01-29 DIAGNOSIS — Y939 Activity, unspecified: Secondary | ICD-10-CM | POA: Diagnosis not present

## 2017-01-29 DIAGNOSIS — S81812A Laceration without foreign body, left lower leg, initial encounter: Secondary | ICD-10-CM

## 2017-01-29 DIAGNOSIS — W28XXXA Contact with powered lawn mower, initial encounter: Secondary | ICD-10-CM | POA: Insufficient documentation

## 2017-01-29 DIAGNOSIS — Y999 Unspecified external cause status: Secondary | ICD-10-CM | POA: Diagnosis not present

## 2017-01-29 MED ORDER — BACITRACIN ZINC 500 UNIT/GM EX OINT
TOPICAL_OINTMENT | Freq: Once | CUTANEOUS | Status: AC
  Administered 2017-01-29: 1 via TOPICAL
  Filled 2017-01-29: qty 4.5

## 2017-01-29 NOTE — Discharge Instructions (Signed)
Please read instructions below. Keep wounds clean and dry. Apply thin layer of bacitracin or neosporin ointment to wounds 2 times per day. Observe wounds for signs of infection, such as increased warm and redness surrounding wounds or pus draining from wounds. Follow up with your primary care. Return to the ER for signs of infections (as noted above), or fever.

## 2017-01-29 NOTE — ED Provider Notes (Signed)
[facility] DEPT Provider Note   CSN: [id number] Arrival date & time: 01/29/17  1226     History   Chief Complaint Chief Complaint  Patient presents with  . Leg Injury    HPI Dean Stanley is a 83 y.o. male.  Patient with past medical history of myelodysplastic syndrome, anemia, hypothyroidism, presents status post mechanical fall off of his lawnmower today prior to arrival. States the lawnmower malfunctioned and threw him off, this is when he sustained abrasions to his left leg. Patient states he took a shower and cleaned his wounds prior to coming to the ED. Reports minimal pain to his left leg, only located where the abrasions are. Patient states he came to the ED today as a precaution to make sure he did not need antibiotics, as his cell counts are chronically low. Denies head trauma, neck or back pain, chest pain, shortness of breath, abdominal pain. Currently he takes OTC iron for his anemia, and has his blood cell counts monitored on a regular basis. States has tetanus is up-to-date within the past 4 years.      Past Medical History:  Diagnosis Date  . Anemia   . Cancer (HCC)    myelodysplastic syndrome  . Complication of anesthesia    Reaction to anesthesia " things went haywire around me"  . Do not resuscitate 11/04/2015  . Hearing impairment    wears hearing left ear  . History of ITP 1960  . Hypothyroidism   . Meniere's syndrome 1980s  . Port catheter in place 12/23/2015    Patient Active Problem List   Diagnosis Date Noted  . Cellulitis of leg, right 04/27/2016  . Port catheter in place 12/23/2015  . Do not resuscitate 11/04/2015  . Rash 10/09/2015  . MDS (myelodysplastic syndrome) ([facility]) 09/10/2015  . Neutropenia ([facility]) 08/27/2015  . Leukocytopenia 08/27/2015  . Meniere's syndrome   . Deficiency anemia 12/18/2014    Past Surgical History:  Procedure Laterality Date  . cataract Right 2011  . CIRCUMCISION    . COLONOSCOPY    . cortisone  injections to back x 6    . ORIF ORBITAL FRACTURE Left 2000   2 fractures orbital platform       Home Medications    Prior to Admission medications   Medication Sig Start Date End Date Taking? Authorizing Provider  calcipotriene (DOVONOX) 0.005 % cream Apply 1 application topically 2 (two) times daily.   Yes Historical Provider, MD  Darbepoetin Alfa (ARANESP) 300 MCG/0.6ML SOSY injection Inject 300 mcg into the skin every 30 (thirty) days.   Yes Historical Provider, MD  desmopressin (DDAVP) 0.2 MG tablet Take 0.2 mg by mouth daily.   Yes Historical Provider, MD  diphenhydrAMINE (BENADRYL) 25 mg capsule Take 25 mg by mouth every 6 (six) hours as needed.   Yes Historical Provider, MD  finasteride (PROPECIA) 1 MG tablet Take 1 mg by mouth daily.   Yes Historical Provider, MD  levothyroxine (SYNTHROID, LEVOTHROID) 200 MCG tablet Take 200 mcg by mouth daily.   Yes Historical Provider, MD  Lutein 40 MG CAPS Take 40 mg by mouth daily. 10/09/15  Yes Historical Provider, MD  meclizine (ANTIVERT) 25 MG tablet Take 25 mg by mouth 3 (three) times daily as needed for dizziness.   Yes Historical Provider, MD  Multiple Vitamins-Minerals (CENTRUM ADULTS PO) Take 1 tablet by mouth daily.    Yes Historical Provider, MD  senna-docusate (SENNA S) 8.6-50 MG tablet Take 2 tablets by mouth every 6 (  six) hours as needed for mild constipation.   Yes Historical Provider, MD  Tbo-Filgrastim (GRANIX) 300 MCG/0.5ML SOSY injection Inject 300 mcg into the skin every 30 (thirty) days.   Yes Historical Provider, MD  traMADol (ULTRAM) 50 MG tablet Take 50 mg by mouth 3 (three) times daily as needed for moderate pain or severe pain.  01/04/16  Yes Lorene Dy, MD  triamcinolone (KENALOG) 0.025 % cream Apply 1 application topically 2 (two) times daily.   Yes Historical Provider, MD    Family History Family History  Problem Relation Age of Onset  . Throat cancer Other   . Pancreatic cancer Father   . Colon cancer Brother      Social History Social History  Substance Use Topics  . Smoking status: Never Smoker  . Smokeless tobacco: Never Used  . Alcohol use Yes     Comment: Occasional     Allergies   Hydrocodone and Oxycodone   Review of Systems Review of Systems  Constitutional: Negative for chills and fever.  Respiratory: Negative for shortness of breath.   Cardiovascular: Negative for chest pain.  Gastrointestinal: Negative for abdominal pain.  Musculoskeletal: Negative for arthralgias, back pain and neck pain.  Skin: Positive for wound.  Allergic/Immunologic: Positive for immunocompromised state.  Neurological: Negative for weakness and headaches.     Physical Exam Updated Vital Signs BP 112/66   Pulse 73   Temp 98.1 F (36.7 C) (Oral)   Resp 16   SpO2 97%   Physical Exam  Constitutional: He is oriented to person, place, and time. He appears well-developed and well-nourished. No distress.  HENT:  Head: Normocephalic and atraumatic.  Eyes: Conjunctivae and EOM are normal. Pupils are equal, round, and reactive to light.  Neck: Normal range of motion. Neck supple.  Cardiovascular: Normal rate, regular rhythm, normal heart sounds and intact distal pulses.  Exam reveals no friction rub.   No murmur heard. Pulmonary/Chest: Effort normal and breath sounds normal. No respiratory distress. He has no wheezes. He has no rales. He exhibits no tenderness.  Abdominal: Soft. Bowel sounds are normal. There is no tenderness.  Musculoskeletal: Normal range of motion. He exhibits no edema.  Full range of motion all extremities. No deformities No spinal or paraspinal tenderness, No bony step-offs. No tenderness   Neurological: He is alert and oriented to person, place, and time.  Skin:  Multiple superficial skin tears medial left upper and lower leg. The wounds are not grossly contaminated, not actively bleeding.   Psychiatric: He has a normal mood and affect. His behavior is normal.  Nursing  note and vitals reviewed.    ED Treatments / Results  Labs (all labs ordered are listed, but only abnormal results are displayed) Labs Reviewed - No data to display  EKG  EKG Interpretation None       Radiology No results found.  Procedures Procedures (including critical care time)  Medications Ordered in ED Medications  bacitracin ointment (1 application Topical Given 01/29/17 1508)     Initial Impression / Assessment and Plan / ED Course  I have reviewed the triage vital signs and the nursing notes.  Pertinent labs & imaging results that were available during my care of the patient were reviewed by me and considered in my medical decision making (see chart for details).     Pt w superficial skin tears L leg s/p mechanical fall. Pt is well appearing, full ROM, not actively bleeding, not grossly contaminated, no deformities. Systemic antibiotics and  imaging not indicated at this time. Per pt, tetanus is up to date. Wound care performed, topical bacitracin applied to wounds. Pt appears reliable to monitor to signs of infection. Will send w wound care instructions and follow up with PCP. Pt safe for discharge home.  Patient discussed with and seen by Dr. Maryan Rued who agrees with care plan.  Discussed results, findings, treatment and follow up. Patient advised of return precautions. Patient verbalized understanding and agreed with plan.  Final Clinical Impressions(s) / ED Diagnoses   Final diagnoses:  Noninfected skin tear of left leg, initial encounter    New Prescriptions Discharge Medication List as of 01/29/2017  3:04 PM       [place] N Russo, PA-C 01/29/17 [place], MD 01/30/17 2159

## 2017-01-29 NOTE — ED Triage Notes (Addendum)
Pt reports he fell off lawnmower and got abrasions on L leg. Pt has MDS, and came here for treatment due to ongoing low platelet levels.

## 2017-02-12 ENCOUNTER — Other Ambulatory Visit: Payer: Self-pay | Admitting: Medical Oncology

## 2017-02-12 DIAGNOSIS — D469 Myelodysplastic syndrome, unspecified: Secondary | ICD-10-CM

## 2017-02-15 ENCOUNTER — Other Ambulatory Visit (HOSPITAL_BASED_OUTPATIENT_CLINIC_OR_DEPARTMENT_OTHER): Payer: Medicare Other

## 2017-02-15 ENCOUNTER — Ambulatory Visit (HOSPITAL_BASED_OUTPATIENT_CLINIC_OR_DEPARTMENT_OTHER): Payer: Medicare Other | Admitting: Internal Medicine

## 2017-02-15 ENCOUNTER — Ambulatory Visit: Payer: Medicare Other

## 2017-02-15 ENCOUNTER — Telehealth: Payer: Self-pay | Admitting: Internal Medicine

## 2017-02-15 ENCOUNTER — Encounter: Payer: Self-pay | Admitting: Internal Medicine

## 2017-02-15 VITALS — BP 114/60 | HR 72 | Temp 98.3°F | Resp 18 | Ht 72.0 in | Wt 180.2 lb

## 2017-02-15 DIAGNOSIS — D539 Nutritional anemia, unspecified: Secondary | ICD-10-CM

## 2017-02-15 DIAGNOSIS — D469 Myelodysplastic syndrome, unspecified: Secondary | ICD-10-CM

## 2017-02-15 DIAGNOSIS — D708 Other neutropenia: Secondary | ICD-10-CM

## 2017-02-15 DIAGNOSIS — D61818 Other pancytopenia: Secondary | ICD-10-CM

## 2017-02-15 LAB — CBC WITH DIFFERENTIAL/PLATELET
BASO%: 0.9 % (ref 0.0–2.0)
BASOS ABS: 0 10*3/uL (ref 0.0–0.1)
EOS ABS: 0 10*3/uL (ref 0.0–0.5)
EOS%: 0.5 % (ref 0.0–7.0)
HEMATOCRIT: 29.9 % — AB (ref 38.4–49.9)
HGB: 9.9 g/dL — ABNORMAL LOW (ref 13.0–17.1)
LYMPH%: 55 % — AB (ref 14.0–49.0)
MCH: 34.1 pg — AB (ref 27.2–33.4)
MCHC: 33.1 g/dL (ref 32.0–36.0)
MCV: 103.1 fL — AB (ref 79.3–98.0)
MONO#: 0.4 10*3/uL (ref 0.1–0.9)
MONO%: 17.9 % — ABNORMAL HIGH (ref 0.0–14.0)
NEUT#: 0.6 10*3/uL — ABNORMAL LOW (ref 1.5–6.5)
NEUT%: 25.7 % — AB (ref 39.0–75.0)
PLATELETS: 134 10*3/uL — AB (ref 140–400)
RBC: 2.9 10*6/uL — ABNORMAL LOW (ref 4.20–5.82)
RDW: 19.6 % — ABNORMAL HIGH (ref 11.0–14.6)
WBC: 2.2 10*3/uL — ABNORMAL LOW (ref 4.0–10.3)
lymph#: 1.2 10*3/uL (ref 0.9–3.3)
nRBC: 4 % — ABNORMAL HIGH (ref 0–0)

## 2017-02-15 MED ORDER — DARBEPOETIN ALFA 300 MCG/0.6ML IJ SOSY
300.0000 ug | PREFILLED_SYRINGE | Freq: Once | INTRAMUSCULAR | Status: DC
  Filled 2017-02-15: qty 0.6

## 2017-02-15 NOTE — Telephone Encounter (Signed)
Gave patient AVS and calender per 5/21 los.  

## 2017-02-15 NOTE — Progress Notes (Signed)
[facility] [phone]   [phone]  OFFICE PROGRESS NOTE  Lorene Dy, MD [address], [place] [address] [place] [address]  DIAGNOSIS: 1)  myelodysplastic syndrome consistent with refractory anemia with excess blast diagnosed in December 2016 2)  History of ITP  PRIOR THERAPY: Vidaza 75 mg/m2 IV for 5 days every 4 weeks. Status post 3 cycles.  CURRENT THERAPY:  1) Supportive care with Aranesp 300 g subcutaneously every 3 weeks in addition to Granix 300 mcg subcutaneously for neutropenia as needed.  INTERVAL HISTORY: Dean Stanley 83 y.o. male returns to the clinic for follow-up visit accompanied by his wife. The patient has no complaints today except for mild fatigue. He was treated several weeks ago for cellulitis of the left lower extremities after a fall from his riding mower. He was treated with a course of antibiotics by his primary care physician. He did not develop any fever or chills at that time. The patient denied having any current chest pain, shortness of breath, cough or hemoptysis. He denied having any nausea or vomiting. He had repeat CBC and he is here today for evaluation and recommendation regarding his condition.   MEDICAL HISTORY: Past Medical History:  Diagnosis Date  . Anemia   . Cancer (HCC)    myelodysplastic syndrome  . Complication of anesthesia    Reaction to anesthesia " things went haywire around me"  . Do not resuscitate 11/04/2015  . Hearing impairment    wears hearing left ear  . History of ITP 1960  . Hypothyroidism   . Meniere's syndrome 1980s  . Port catheter in place 12/23/2015    ALLERGIES:  is allergic to hydrocodone and oxycodone.  MEDICATIONS:  Current Outpatient Prescriptions  Medication Sig Dispense Refill  . cephALEXin (KEFLEX) 500 MG capsule Take 500 mg by mouth 4 (four) times daily.    Marland Kitchen desmopressin (DDAVP) 0.2 MG tablet Take 0.2 mg by mouth daily.    . ferrous sulfate 325 (65 FE)  MG tablet Take 325 mg by mouth daily with breakfast.    . finasteride (PROPECIA) 1 MG tablet Take 1 mg by mouth daily.    Marland Kitchen levothyroxine (SYNTHROID, LEVOTHROID) 200 MCG tablet Take 200 mcg by mouth daily.    . Lutein 40 MG CAPS Take 40 mg by mouth daily.    . Multiple Vitamins-Minerals (CENTRUM ADULTS PO) Take 1 tablet by mouth daily.     Marland Kitchen triamcinolone (KENALOG) 0.025 % cream Apply 1 application topically 2 (two) times daily.    . cortisone (CORTONE) 25 MG tablet Take 25 mg by mouth daily as needed.    . Darbepoetin Alfa (ARANESP) 300 MCG/0.6ML SOSY injection Inject 300 mcg into the skin every 30 (thirty) days.    . diphenhydrAMINE (BENADRYL) 25 mg capsule Take 25 mg by mouth every 6 (six) hours as needed.    . meclizine (ANTIVERT) 25 MG tablet Take 25 mg by mouth 3 (three) times daily as needed for dizziness.    . senna-docusate (SENNA S) 8.6-50 MG tablet Take 2 tablets by mouth every 6 (six) hours as needed for mild constipation.    . Tbo-Filgrastim (GRANIX) 300 MCG/0.5ML SOSY injection Inject 300 mcg into the skin every 30 (thirty) days.    . traMADol (ULTRAM) 50 MG tablet Take 50 mg by mouth 3 (three) times daily as needed for moderate pain or severe pain.      No current facility-administered medications for this visit.  Facility-Administered Medications Ordered in Other Visits  Medication Dose Route Frequency Provider Last Rate Last Dose  . azaCITIDine (VIDAZA) 150 mg in lactated ringers 45 mL chemo infusion  75 mg/m2 (Treatment Plan Recorded) Intravenous Daily Curt Bears, MD   150 mg at 12/02/15 1428  . Darbepoetin Alfa (ARANESP) injection 300 mcg  300 mcg Subcutaneous Once Curt Bears, MD      . heparin lock flush 100 unit/mL  500 Units Intracatheter Once PRN Curt Bears, MD      . sodium chloride 0.9 % injection 10 mL  10 mL Intracatheter PRN Curt Bears, MD        SURGICAL HISTORY:  Past Surgical History:  Procedure Laterality Date  . cataract Right 2011   . CIRCUMCISION    . COLONOSCOPY    . cortisone injections to back x 6    . ORIF ORBITAL FRACTURE Left 2000   2 fractures orbital platform    REVIEW OF SYSTEMS:  A comprehensive review of systems was negative except for: Constitutional: positive for fatigue   PHYSICAL EXAMINATION: General appearance: alert, cooperative, fatigued and no distress Head: Normocephalic, without obvious abnormality, atraumatic Neck: no adenopathy, no JVD, supple, symmetrical, trachea midline and thyroid not enlarged, symmetric, no tenderness/mass/nodules Lymph nodes: Cervical, supraclavicular, and axillary nodes normal. Resp: clear to auscultation bilaterally Back: symmetric, no curvature. ROM normal. No CVA tenderness. Cardio: regular rate and rhythm, S1, S2 normal, no murmur, click, rub or gallop GI: soft, non-tender; bowel sounds normal; no masses,  no organomegaly Extremities: extremities normal, atraumatic, no cyanosis or edema  ECOG PERFORMANCE STATUS: 1 - Symptomatic but completely ambulatory  Blood pressure 114/60, pulse 72, temperature 98.3 F (36.8 C), temperature source Oral, resp. rate 18, height 6' (1.829 m), weight 180 lb 3.2 oz (81.7 kg), SpO2 99 %.  LABORATORY DATA: Lab Results  Component Value Date   WBC 2.2 (L) 02/15/2017   HGB 9.9 (L) 02/15/2017   HCT 29.9 (L) 02/15/2017   MCV 103.1 (H) 02/15/2017   PLT 134 (L) 02/15/2017      Chemistry      Component Value Date/Time   NA 140 01/25/2017 1314   K 4.2 01/25/2017 1314   CL 101 04/05/2016 1517   CO2 27 01/25/2017 1314   BUN 13.8 01/25/2017 1314   CREATININE 0.8 01/25/2017 1314      Component Value Date/Time   CALCIUM 9.3 01/25/2017 1314   ALKPHOS 46 01/25/2017 1314   AST 18 01/25/2017 1314   ALT 15 01/25/2017 1314   BILITOT 1.29 (H) 01/25/2017 1314       RADIOGRAPHIC STUDIES: No results found.  ASSESSMENT AND PLAN:   This is a very pleasant 83 years old white male with myelodysplastic syndrome consistent with  refractory anemia with excess blasts status post systemic chemotherapy with Vidaza 3 cycles and has been observation since that time. The patient is doing fine and he continues to have pancytopenia. His hemoglobin is 9.9 today. The patient was a candidate for Aranesp injection but he declined. I will see him back for follow-up visit in 3 months for reevaluation with repeat blood work. He will continues to have repeat CBC and Aranesp if needed every 3 weeks. The patient was advised to call immediately if he has any concerning symptoms in the interval. The patient voices understanding of current disease status and treatment options and is in agreement with the current care plan. All questions were answered. The patient knows to call the clinic with any problems, questions  or concerns. We can certainly see the patient much sooner if necessary. I spent 10 minutes counseling the patient face to face. The total time spent in the appointment was 15 minutes.  Disclaimer: This note was dictated with voice recognition software. Similar sounding words can inadvertently be transcribed and may not be corrected upon review.

## 2017-03-01 ENCOUNTER — Emergency Department (HOSPITAL_COMMUNITY): Payer: Medicare Other

## 2017-03-01 ENCOUNTER — Emergency Department (HOSPITAL_COMMUNITY)
Admission: EM | Admit: 2017-03-01 | Discharge: 2017-03-01 | Disposition: A | Discharge status: Home / Self Care | Payer: Medicare Other | Attending: Emergency Medicine | Admitting: Emergency Medicine

## 2017-03-01 ENCOUNTER — Encounter (HOSPITAL_COMMUNITY): Payer: Self-pay | Admitting: Emergency Medicine

## 2017-03-01 DIAGNOSIS — S50811A Abrasion of right forearm, initial encounter: Secondary | ICD-10-CM | POA: Insufficient documentation

## 2017-03-01 DIAGNOSIS — W010XXA Fall on same level from slipping, tripping and stumbling without subsequent striking against object, initial encounter: Secondary | ICD-10-CM

## 2017-03-01 DIAGNOSIS — E039 Hypothyroidism, unspecified: Secondary | ICD-10-CM | POA: Diagnosis not present

## 2017-03-01 DIAGNOSIS — S39012A Strain of muscle, fascia and tendon of lower back, initial encounter: Secondary | ICD-10-CM | POA: Diagnosis not present

## 2017-03-01 DIAGNOSIS — Z79899 Other long term (current) drug therapy: Secondary | ICD-10-CM | POA: Insufficient documentation

## 2017-03-01 DIAGNOSIS — Y939 Activity, unspecified: Secondary | ICD-10-CM | POA: Insufficient documentation

## 2017-03-01 DIAGNOSIS — Y929 Unspecified place or not applicable: Secondary | ICD-10-CM | POA: Insufficient documentation

## 2017-03-01 DIAGNOSIS — S22088A Other fracture of T11-T12 vertebra, initial encounter for closed fracture: Secondary | ICD-10-CM | POA: Insufficient documentation

## 2017-03-01 DIAGNOSIS — S3992XA Unspecified injury of lower back, initial encounter: Secondary | ICD-10-CM | POA: Diagnosis present

## 2017-03-01 DIAGNOSIS — S29012A Strain of muscle and tendon of back wall of thorax, initial encounter: Secondary | ICD-10-CM | POA: Insufficient documentation

## 2017-03-01 DIAGNOSIS — W1789XA Other fall from one level to another, initial encounter: Secondary | ICD-10-CM | POA: Insufficient documentation

## 2017-03-01 DIAGNOSIS — Y999 Unspecified external cause status: Secondary | ICD-10-CM | POA: Insufficient documentation

## 2017-03-01 DIAGNOSIS — S29019A Strain of muscle and tendon of unspecified wall of thorax, initial encounter: Secondary | ICD-10-CM

## 2017-03-01 DIAGNOSIS — S22000A Wedge compression fracture of unspecified thoracic vertebra, initial encounter for closed fracture: Secondary | ICD-10-CM

## 2017-03-01 DIAGNOSIS — T07XXXA Unspecified multiple injuries, initial encounter: Secondary | ICD-10-CM

## 2017-03-01 MED ORDER — SODIUM CHLORIDE 0.9 % IV BOLUS (SEPSIS)
1000.0000 mL | Freq: Once | INTRAVENOUS | Status: AC
  Administered 2017-03-01: 1000 mL via INTRAVENOUS

## 2017-03-01 MED ORDER — TRAMADOL HCL 50 MG PO TABS: 50.0000 mg | ORAL_TABLET | Freq: Four times a day (QID) | ORAL | 0 refills | Status: DC | PRN

## 2017-03-01 MED ORDER — METHOCARBAMOL 750 MG PO TABS: 750.0000 mg | ORAL_TABLET | Freq: Three times a day (TID) | ORAL | 0 refills | Status: DC | PRN

## 2017-03-01 MED ORDER — METHOCARBAMOL 500 MG PO TABS
750.0000 mg | ORAL_TABLET | Freq: Once | ORAL | Status: AC
  Administered 2017-03-01: 750 mg via ORAL
  Filled 2017-03-01: qty 2

## 2017-03-01 MED ORDER — ONDANSETRON HCL 4 MG/2ML IJ SOLN
4.0000 mg | Freq: Once | INTRAMUSCULAR | Status: AC
  Administered 2017-03-01: 4 mg via INTRAVENOUS
  Filled 2017-03-01: qty 2

## 2017-03-01 MED ORDER — HYDROMORPHONE HCL 1 MG/ML IJ SOLN
1.0000 mg | Freq: Once | INTRAMUSCULAR | Status: AC
  Administered 2017-03-01: 1 mg via INTRAVENOUS
  Filled 2017-03-01: qty 1

## 2017-03-01 MED ORDER — BACITRACIN ZINC 500 UNIT/GM EX OINT
TOPICAL_OINTMENT | Freq: Two times a day (BID) | CUTANEOUS | Status: DC
  Filled 2017-03-01: qty 0.9

## 2017-03-01 NOTE — Discharge Instructions (Signed)
It was our pleasure to provide your ER care today - we hope that you feel better.  Rest. Avoid bending at waist, or heavy lifting > 10 lbs, for the week.  Try to find a position of comfort, for example, lying with pillows behind knees.   Take motrin or aleve as need for pain.  You may also try ultram as need for pain, and/or robaxin as need for muscle spasm - no driving when taking these medications.  Keep abrasions very clean - you may apply a thin coat of bacitracin to areas for the next few days.  Return to ER if worse, intractable pain, new pain, or other concern.

## 2017-03-01 NOTE — ED Triage Notes (Addendum)
Per pt, states he fell from a 10 foot wall injuring right middle back-states he is in terrible pain-no LOC

## 2017-03-01 NOTE — ED Provider Notes (Signed)
[facility] DEPT Provider Note   CSN: [id number] Arrival date & time: 03/01/17  1424     History   Chief Complaint Chief Complaint  Patient presents with  . Fall    HPI Dean Stanley is a 83 y.o. male.  Patient s/p fall. Was cleaning vines from yard near retaining wall, and slipped and fell from height of 8-9 feet landing on feet, and then fell back onto rocks. C/o mid to lower back pain. Constant, dull, severe, non radiating. No radicular pain. No numbness/weakness. Denies head injury or loc. No headache. No neck pain. Abrasion to right forearm. Denies other pain or injury.  Recent health has been at baseline, no other acute symptoms.    The history is provided by the patient.  Fall  Pertinent negatives include no chest pain, no abdominal pain, no headaches and no shortness of breath.    Past Medical History:  Diagnosis Date  . Anemia   . Cancer (HCC)    myelodysplastic syndrome  . Complication of anesthesia    Reaction to anesthesia " things went haywire around me"  . Do not resuscitate 11/04/2015  . Hearing impairment    wears hearing left ear  . History of ITP 1960  . Hypothyroidism   . Meniere's syndrome 1980s  . Port catheter in place 12/23/2015    Patient Active Problem List   Diagnosis Date Noted  . Cellulitis of leg, right 04/27/2016  . Port catheter in place 12/23/2015  . Do not resuscitate 11/04/2015  . Rash 10/09/2015  . MDS (myelodysplastic syndrome) ([facility]) 09/10/2015  . Neutropenia ([facility]) 08/27/2015  . Leukocytopenia 08/27/2015  . Meniere's syndrome   . Deficiency anemia 12/18/2014    Past Surgical History:  Procedure Laterality Date  . cataract Right 2011  . CIRCUMCISION    . COLONOSCOPY    . cortisone injections to back x 6    . ORIF ORBITAL FRACTURE Left 2000   2 fractures orbital platform       Home Medications    Prior to Admission medications   Medication Sig Start Date End Date Taking? Authorizing Provider  cephALEXin  (KEFLEX) 500 MG capsule Take 500 mg by mouth 4 (four) times daily.    [provider]  cortisone (CORTONE) 25 MG tablet Take 25 mg by mouth daily as needed.    [provider]  Darbepoetin Alfa (ARANESP) 300 MCG/0.6ML SOSY injection Inject 300 mcg into the skin every 30 (thirty) days.    [provider]  desmopressin (DDAVP) 0.2 MG tablet Take 0.2 mg by mouth daily.    [provider]  diphenhydrAMINE (BENADRYL) 25 mg capsule Take 25 mg by mouth every 6 (six) hours as needed.    [provider]  ferrous sulfate 325 (65 FE) MG tablet Take 325 mg by mouth daily with breakfast.    [provider]  finasteride (PROPECIA) 1 MG tablet Take 1 mg by mouth daily.    [provider]  levothyroxine (SYNTHROID, LEVOTHROID) 200 MCG tablet Take 200 mcg by mouth daily.    [provider]  Lutein 40 MG CAPS Take 40 mg by mouth daily. 10/09/15   [provider]  meclizine (ANTIVERT) 25 MG tablet Take 25 mg by mouth 3 (three) times daily as needed for dizziness.    [provider]  Multiple Vitamins-Minerals (CENTRUM ADULTS PO) Take 1 tablet by mouth daily.     [provider]  senna-docusate (SENNA S) 8.6-50 MG tablet Take 2  tablets by mouth every 6 (six) hours as needed for mild constipation.    [provider]  Tbo-Filgrastim (GRANIX) 300 MCG/0.5ML SOSY injection Inject 300 mcg into the skin every 30 (thirty) days.    [provider]  traMADol (ULTRAM) 50 MG tablet Take 50 mg by mouth 3 (three) times daily as needed for moderate pain or severe pain.  01/04/16   Lorene Dy, MD  triamcinolone (KENALOG) 0.025 % cream Apply 1 application topically 2 (two) times daily.    [provider]    Family History Family History  Problem Relation Age of Onset  . Throat cancer Other   . Pancreatic cancer Father   . Colon cancer Brother     Social History Social History  Substance Use Topics    . Smoking status: Never Smoker  . Smokeless tobacco: Never Used  . Alcohol use Yes     Comment: Occasional     Allergies   Hydrocodone and Oxycodone   Review of Systems Review of Systems  Constitutional: Negative for fever.  HENT: Negative for nosebleeds.   Respiratory: Negative for shortness of breath.   Cardiovascular: Negative for chest pain.  Gastrointestinal: Negative for abdominal pain and vomiting.  Genitourinary: Negative for hematuria.  Musculoskeletal: Positive for back pain. Negative for neck pain.  Skin: Positive for wound.  Neurological: Negative for weakness, numbness and headaches.  Hematological:       No anticoag use.   Psychiatric/Behavioral: Negative for confusion.     Physical Exam Updated Vital Signs There were no vitals taken for this visit.  Physical Exam  Constitutional: He is oriented to person, place, and time. He appears well-developed and well-nourished.  HENT:  Head: Atraumatic.  Mouth/Throat: Oropharynx is clear and moist.  Eyes: Conjunctivae are normal. Pupils are equal, round, and reactive to light.  Neck: Neck supple. No tracheal deviation present.  Cardiovascular: Normal rate, regular rhythm, normal heart sounds and intact distal pulses.   Pulmonary/Chest: Effort normal and breath sounds normal. No accessory muscle usage. No respiratory distress. He exhibits no tenderness.  Abdominal: Soft. He exhibits no distension. There is no tenderness.  Musculoskeletal: He exhibits no edema.  Mid to lower thoracic and upper lumbar tenderness, otherwise, CTLS spine, non tender, aligned, no step off. Superficial abrasions right forearm.  Good rom bil ext without pain or focal bony tenderness.   Neurological: He is alert and oriented to person, place, and time.  Skin: Skin is warm and dry.  Psychiatric: He has a normal mood and affect.  Nursing note and vitals reviewed.    ED Treatments / Results  Labs (all labs ordered are listed, but only  abnormal results are displayed) Labs Reviewed - No data to display  EKG  EKG Interpretation None       Radiology Dg Thoracic Spine 2 View  Result Date: 03/01/2017 CLINICAL DATA:  [place] from a 10 foot wall.  Back pain. EXAM: THORACIC SPINE 2 VIEWS COMPARISON:  Thoracic radiography 01/08/2016 FINDINGS: Negative for definite fracture. One could question mild loss of height of T7 compared to the study of 2017, but this is not definite. Mild chronic thoracic curvature. Mild chronic thoracic degenerative change. IMPRESSION: Curvature and degenerative change as seen previously. One could question minimal loss of height at T7 compared to last year's exam. This is not definite, but there may be a mild compression fracture at that level. Electronically Signed   By: Nelson Chimes M.D.   On: 03/01/2017 15:56   Dg  Lumbar Spine Complete  Result Date: 03/01/2017 CLINICAL DATA:  [place] from a 10 foot wall today. EXAM: LUMBAR SPINE - COMPLETE 4+ VIEW COMPARISON:  01/08/2016 FINDINGS: Thoracolumbar curvature convex to the right and lower lumbar curvature convex to the left. No evidence of lumbar fracture. Chronic degenerative disc disease and degenerative facet disease throughout the lumbar region, unchanged. No sign of lumbar or sacral fracture. IMPRESSION: Scoliosis and chronic degenerative disease. No traumatic lumbar finding. Electronically Signed   By: Nelson Chimes M.D.   On: 03/01/2017 15:52    Procedures Procedures (including critical care time)  Medications Ordered in ED Medications  sodium chloride 0.9 % bolus 1,000 mL (not administered)  HYDROmorphone (DILAUDID) injection 1 mg (1 mg Intravenous Given 03/01/17 1506)  ondansetron (ZOFRAN) injection 4 mg (4 mg Intravenous Given 03/01/17 1506)     Initial Impression / Assessment and Plan / ED Course  I have reviewed the triage vital signs and the nursing notes.  Pertinent labs & imaging results that were available during my care of the patient were  reviewed by me and considered in my medical decision making (see chart for details).  Pt very uncomfortable appearing.   Iv ns. Dilaudid 1 mg iv. zofran iv.  Xrays.  Reviewed nursing notes and prior charts for additional history.   Abrasions cleaned, bacitracin and sterile dressing.  Pt indicates last tetanus was approximately 5 years ago.   Pain much improved w meds.   Recheck abd soft nt.   Final Clinical Impressions(s) / ED Diagnoses   Final diagnoses:  None    New Prescriptions New Prescriptions   No medications on file     Lajean Saver, MD 03/01/17 1640

## 2017-03-01 NOTE — ED Notes (Signed)
Patient is alert and oriented x3.  He was given DC instructions and follow up visit instructions.  Patient gave verbal understanding.  He was DC via wheelchair  to home.  V/S stable.  He was not showing any signs of distress on DC

## 2017-03-01 NOTE — ED Notes (Signed)
Bed: WA24 Expected date:  Expected time:  Means of arrival:  Comments: Hold for triage 

## 2017-03-05 ENCOUNTER — Other Ambulatory Visit: Payer: Self-pay | Admitting: Medical Oncology

## 2017-03-05 DIAGNOSIS — D469 Myelodysplastic syndrome, unspecified: Secondary | ICD-10-CM

## 2017-03-08 ENCOUNTER — Other Ambulatory Visit (HOSPITAL_BASED_OUTPATIENT_CLINIC_OR_DEPARTMENT_OTHER): Payer: Medicare Other

## 2017-03-08 ENCOUNTER — Ambulatory Visit: Payer: Medicare Other

## 2017-03-08 DIAGNOSIS — D469 Myelodysplastic syndrome, unspecified: Secondary | ICD-10-CM | POA: Diagnosis not present

## 2017-03-08 LAB — TECHNOLOGIST REVIEW

## 2017-03-08 LAB — CBC WITH DIFFERENTIAL/PLATELET
BASO%: 0.5 % (ref 0.0–2.0)
Basophils Absolute: 0 10*3/uL (ref 0.0–0.1)
EOS%: 1 % (ref 0.0–7.0)
Eosinophils Absolute: 0 10*3/uL (ref 0.0–0.5)
HCT: 30.3 % — ABNORMAL LOW (ref 38.4–49.9)
HEMOGLOBIN: 9.9 g/dL — AB (ref 13.0–17.1)
LYMPH#: 1 10*3/uL (ref 0.9–3.3)
LYMPH%: 48.1 % (ref 14.0–49.0)
MCH: 34 pg — ABNORMAL HIGH (ref 27.2–33.4)
MCHC: 32.7 g/dL (ref 32.0–36.0)
MCV: 104.1 fL — ABNORMAL HIGH (ref 79.3–98.0)
MONO#: 0.3 10*3/uL (ref 0.1–0.9)
MONO%: 14.1 % — ABNORMAL HIGH (ref 0.0–14.0)
NEUT#: 0.8 10*3/uL — ABNORMAL LOW (ref 1.5–6.5)
NEUT%: 36.3 % — ABNORMAL LOW (ref 39.0–75.0)
NRBC: 2 % — AB (ref 0–0)
Platelets: 122 10*3/uL — ABNORMAL LOW (ref 140–400)
RBC: 2.91 10*6/uL — ABNORMAL LOW (ref 4.20–5.82)
RDW: 20.2 % — AB (ref 11.0–14.6)
WBC: 2.1 10*3/uL — ABNORMAL LOW (ref 4.0–10.3)

## 2017-03-08 NOTE — Progress Notes (Signed)
Pt's Hemoglobin at 9.9 today. Pt declined  To take Aranesp shot. Pt is alert and oriented. Copy of labs given to patient, instructed to keep scheduled appointments and to call office should issues occur.

## 2017-03-11 ENCOUNTER — Encounter (HOSPITAL_COMMUNITY): Payer: Self-pay | Admitting: Emergency Medicine

## 2017-03-11 ENCOUNTER — Emergency Department (HOSPITAL_COMMUNITY)
Admission: EM | Admit: 2017-03-11 | Discharge: 2017-03-11 | Disposition: A | Discharge status: Home / Self Care | Payer: Medicare Other | Attending: Emergency Medicine | Admitting: Emergency Medicine

## 2017-03-11 ENCOUNTER — Emergency Department (HOSPITAL_COMMUNITY): Payer: Medicare Other

## 2017-03-11 DIAGNOSIS — E039 Hypothyroidism, unspecified: Secondary | ICD-10-CM | POA: Insufficient documentation

## 2017-03-11 DIAGNOSIS — R1032 Left lower quadrant pain: Secondary | ICD-10-CM | POA: Diagnosis present

## 2017-03-11 DIAGNOSIS — K5732 Diverticulitis of large intestine without perforation or abscess without bleeding: Secondary | ICD-10-CM | POA: Diagnosis not present

## 2017-03-11 DIAGNOSIS — Z79899 Other long term (current) drug therapy: Secondary | ICD-10-CM | POA: Diagnosis not present

## 2017-03-11 DIAGNOSIS — K5792 Diverticulitis of intestine, part unspecified, without perforation or abscess without bleeding: Secondary | ICD-10-CM

## 2017-03-11 LAB — COMPREHENSIVE METABOLIC PANEL
ALBUMIN: 4.3 g/dL (ref 3.5–5.0)
ALT: 13 U/L — AB (ref 17–63)
AST: 16 U/L (ref 15–41)
Alkaline Phosphatase: 57 U/L (ref 38–126)
Anion gap: 9 (ref 5–15)
BUN: 9 mg/dL (ref 6–20)
CHLORIDE: 102 mmol/L (ref 101–111)
CO2: 24 mmol/L (ref 22–32)
CREATININE: 0.67 mg/dL (ref 0.61–1.24)
Calcium: 8.9 mg/dL (ref 8.9–10.3)
GFR calc Af Amer: >60 mL/min (ref >60)
GFR calc non Af Amer: >60 mL/min (ref >60)
Glucose, Bld: 101 mg/dL — ABNORMAL HIGH (ref 65–99)
Potassium: 3.8 mmol/L (ref 3.5–5.1)
SODIUM: 135 mmol/L (ref 135–145)
Total Bilirubin: 1.2 mg/dL (ref 0.3–1.2)
Total Protein: 7.2 g/dL (ref 6.5–8.1)

## 2017-03-11 LAB — URINALYSIS, ROUTINE W REFLEX MICROSCOPIC
BILIRUBIN URINE: NEGATIVE
GLUCOSE, UA: NEGATIVE mg/dL
HGB URINE DIPSTICK: NEGATIVE
Ketones, ur: NEGATIVE mg/dL
Leukocytes, UA: NEGATIVE
Nitrite: NEGATIVE
Protein, ur: NEGATIVE mg/dL
SPECIFIC GRAVITY, URINE: 1.01 (ref 1.005–1.030)
pH: 7.5 (ref 5.0–8.0)

## 2017-03-11 LAB — CBC WITH DIFFERENTIAL/PLATELET
Basophils Absolute: 0 10*3/uL (ref 0.0–0.1)
Basophils Relative: 0 %
EOS ABS: 0 10*3/uL (ref 0.0–0.7)
EOS PCT: 1 %
HCT: 32.6 % — ABNORMAL LOW (ref 39.0–52.0)
Hemoglobin: 10.8 g/dL — ABNORMAL LOW (ref 13.0–17.0)
LYMPHS ABS: 0.9 10*3/uL (ref 0.7–4.0)
Lymphocytes Relative: 28 %
MCH: 33.8 pg (ref 26.0–34.0)
MCHC: 33.1 g/dL (ref 30.0–36.0)
MCV: 101.9 fL — AB (ref 78.0–100.0)
MONOS PCT: 19 %
Monocytes Absolute: 0.6 10*3/uL (ref 0.1–1.0)
Neutro Abs: 1.6 10*3/uL — ABNORMAL LOW (ref 1.7–7.7)
Neutrophils Relative %: 52 %
PLATELETS: 127 10*3/uL — AB (ref 150–400)
RBC: 3.2 MIL/uL — ABNORMAL LOW (ref 4.22–5.81)
RDW: 20 % — ABNORMAL HIGH (ref 11.5–15.5)
WBC: 3 10*3/uL — ABNORMAL LOW (ref 4.0–10.5)

## 2017-03-11 MED ORDER — IOPAMIDOL (ISOVUE-300) INJECTION 61%
INTRAVENOUS | Status: AC
  Filled 2017-03-11: qty 100

## 2017-03-11 MED ORDER — CIPROFLOXACIN HCL 500 MG PO TABS: 500.0000 mg | ORAL_TABLET | Freq: Two times a day (BID) | ORAL | 0 refills | Status: DC

## 2017-03-11 MED ORDER — IOPAMIDOL (ISOVUE-300) INJECTION 61%
100.0000 mL | Freq: Once | INTRAVENOUS | Status: AC | PRN
  Administered 2017-03-11: 100 mL via INTRAVENOUS

## 2017-03-11 MED ORDER — SODIUM CHLORIDE 0.9 % IV BOLUS (SEPSIS)
500.0000 mL | Freq: Once | INTRAVENOUS | Status: AC
  Administered 2017-03-11: 500 mL via INTRAVENOUS

## 2017-03-11 MED ORDER — CIPROFLOXACIN HCL 500 MG PO TABS
500.0000 mg | ORAL_TABLET | Freq: Once | ORAL | Status: AC
  Administered 2017-03-11: 500 mg via ORAL
  Filled 2017-03-11: qty 1

## 2017-03-11 MED ORDER — METRONIDAZOLE 500 MG PO TABS: 500.0000 mg | ORAL_TABLET | Freq: Three times a day (TID) | ORAL | 0 refills | Status: DC

## 2017-03-11 MED ORDER — METRONIDAZOLE 500 MG PO TABS
500.0000 mg | ORAL_TABLET | Freq: Once | ORAL | Status: AC
  Administered 2017-03-11: 500 mg via ORAL
  Filled 2017-03-11: qty 1

## 2017-03-11 NOTE — ED Provider Notes (Signed)
[facility] DEPT Provider Note   CSN: [id number] Arrival date & time: 03/11/17  1101     History   Chief Complaint Chief Complaint  Patient presents with  . LLQ pain    HPI Casimer Russett is a 83 y.o. male.  The history is provided by the patient. No language interpreter was used.   Koury Roddy is a 83 y.o. male who presents to the Emergency Department complaining of abdominal pain.  He reports 2 days of lower abdominal discomfort, bloating, constipation. He tried a Fleet enema at home with minimal relief of his symptoms and he has been frequently straining on the commode. He tried it to notice some pain in his left lower quadrant. He tried again at 5 AM and had a good bowel movement but develops significant worsening of his left lower quadrant pain. He denies any fevers, vomiting. He does report severe pain in the left lower quadrant with coughing or any movement. He also has persistence lower abdominal bloating. Past Medical History:  Diagnosis Date  . Anemia   . Cancer (HCC)    myelodysplastic syndrome  . Complication of anesthesia    Reaction to anesthesia " things went haywire around me"  . Do not resuscitate 11/04/2015  . Hearing impairment    wears hearing left ear  . History of ITP 1960  . Hypothyroidism   . Meniere's syndrome 1980s  . Port catheter in place 12/23/2015    Patient Active Problem List   Diagnosis Date Noted  . Cellulitis of leg, right 04/27/2016  . Port catheter in place 12/23/2015  . Do not resuscitate 11/04/2015  . Rash 10/09/2015  . MDS (myelodysplastic syndrome) ([place]) 09/10/2015  . Neutropenia ([place]) 08/27/2015  . Leukocytopenia 08/27/2015  . Meniere's syndrome   . Deficiency anemia 12/18/2014    Past Surgical History:  Procedure Laterality Date  . cataract Right 2011  . CIRCUMCISION    . COLONOSCOPY    . cortisone injections to back x 6    . ORIF ORBITAL FRACTURE Left 2000   2 fractures orbital platform        Home Medications    Prior to Admission medications   Medication Sig Start Date End Date Taking? Authorizing Provider  cephALEXin (KEFLEX) 500 MG capsule Take 500 mg by mouth 4 (four) times daily. Continuous- for infected skin abrasions   Yes [provider]  Darbepoetin Alfa (ARANESP) 300 MCG/0.6ML SOSY injection Inject 300 mcg into the skin every 30 (thirty) days.   Yes [provider]  desmopressin (DDAVP) 0.2 MG tablet Take 0.2 mg by mouth daily.   Yes [provider]  docusate calcium (KAO-TIN) 240 MG capsule Take 240 mg by mouth daily.   Yes [provider]  ferrous sulfate 325 (65 FE) MG tablet Take 325 mg by mouth daily with breakfast.   Yes [provider]  finasteride (PROPECIA) 1 MG tablet Take 1 mg by mouth daily with breakfast.    Yes [provider]  levothyroxine (SYNTHROID, LEVOTHROID) 200 MCG tablet Take 200 mcg by mouth at bedtime.    Yes [provider]  Lutein 40 MG CAPS Take 40 mg by mouth daily with breakfast.  10/09/15  Yes [provider]  meclizine (ANTIVERT) 25 MG tablet Take 25 mg by mouth 3 (three) times daily as needed for dizziness.   Yes [provider]  methocarbamol (ROBAXIN) 750 MG tablet Take 1 tablet (750 mg total) by mouth every 8 (eight) hours as  needed for muscle spasms. 03/01/17  Yes Lajean Saver, MD  Multiple Vitamins-Minerals (CENTRUM ADULTS PO) Take 1 tablet by mouth daily.    Yes [provider]  naproxen sodium (ANAPROX) 220 MG tablet Take 220-440 mg by mouth 2 (two) times daily as needed (for pain).   Yes [provider]  senna-docusate (SENNA S) 8.6-50 MG tablet Take 2 tablets by mouth every 6 (six) hours as needed for mild constipation.   Yes [provider]  Tbo-Filgrastim (GRANIX) 300 MCG/0.5ML SOSY injection Inject 300 mcg into the skin every 30 (thirty) days.   Yes [provider]  traMADol (ULTRAM) 50 MG tablet Take 50 mg  by mouth 3 (three) times daily as needed for moderate pain or severe pain.  01/04/16  Yes Lorene Dy, MD  traMADol (ULTRAM) 50 MG tablet Take 1 tablet (50 mg total) by mouth every 6 (six) hours as needed. 03/01/17  Yes Lajean Saver, MD  triamcinolone (KENALOG) 0.025 % cream Apply 1 application topically 2 (two) times daily as needed (for inflammation).    Yes [provider]  ciprofloxacin (CIPRO) 500 MG tablet Take 1 tablet (500 mg total) by mouth 2 (two) times daily. 03/11/17   Quintella Reichert, MD  metroNIDAZOLE (FLAGYL) 500 MG tablet Take 1 tablet (500 mg total) by mouth 3 (three) times daily. 03/11/17   Quintella Reichert, MD    Family History Family History  Problem Relation Age of Onset  . Throat cancer Other   . Pancreatic cancer Father   . Colon cancer Brother     Social History Social History  Substance Use Topics  . Smoking status: Never Smoker  . Smokeless tobacco: Never Used  . Alcohol use Yes     Comment: Occasional     Allergies   Percocet [oxycodone-acetaminophen]; Hydrocodone; and Oxycodone   Review of Systems Review of Systems  All other systems reviewed and are negative.    Physical Exam Updated Vital Signs BP (!) 139/95 (BP Location: Right Arm)   Pulse 90   Temp 98.1 F (36.7 C) (Oral)   Resp 16   SpO2 95%   Physical Exam  Constitutional: He is oriented to person, place, and time. He appears well-developed and well-nourished.  HENT:  Head: Normocephalic and atraumatic.  Cardiovascular: Normal rate and regular rhythm.   No murmur heard. Pulmonary/Chest: Effort normal and breath sounds normal. No respiratory distress.  Abdominal: Soft.  Moderate bloating to the lower and mid abdomen. Significant tenderness over the left lower quadrant with voluntary guarding. No inguinal hernia or mass. 2+ femoral pulses bilaterally.  Musculoskeletal: He exhibits no edema or tenderness.  Neurological: He is alert and oriented to person, place, and time.   Skin: Skin is warm and dry.  Psychiatric: He has a normal mood and affect. His behavior is normal.  Nursing note and vitals reviewed.    ED Treatments / Results  Labs (all labs ordered are listed, but only abnormal results are displayed) Labs Reviewed  COMPREHENSIVE METABOLIC PANEL - Abnormal; Notable for the following:       Result Value   Glucose, Bld 101 (*)    ALT 13 (*)    All other components within normal limits  CBC WITH DIFFERENTIAL/PLATELET - Abnormal; Notable for the following:    WBC 3.0 (*)    RBC 3.20 (*)    Hemoglobin 10.8 (*)    HCT 32.6 (*)    MCV 101.9 (*)    RDW 20.0 (*)    Platelets  127 (*)    Neutro Abs 1.6 (*)    All other components within normal limits  URINALYSIS, ROUTINE W REFLEX MICROSCOPIC    EKG  EKG Interpretation None       Radiology Ct Abdomen Pelvis W Contrast  Result Date: 03/11/2017 CLINICAL DATA:  Left lower quadrant pain starting around 20 a.m. Patient on Toradol which makes him feel constipated. EXAM: CT ABDOMEN AND PELVIS WITH CONTRAST TECHNIQUE: Multidetector CT imaging of the abdomen and pelvis was performed using the standard protocol following bolus administration of intravenous contrast. CONTRAST:  1103mL ISOVUE-300 IOPAMIDOL (ISOVUE-300) INJECTION 61% COMPARISON:  08/12/2015 noncontrast study, MRI abdomen 01/05/2014 FINDINGS: Lower chest: Chronic bibasilar lower lobe scarring. No pneumonic consolidation, effusion or pneumothorax. Normal sized cardiac chambers of coronary arteriosclerosis. The Hepatobiliary: Stable hepatic cysts since previous exam, the largest in the right hepatic lobe measuring approximately 4.3 cm. No biliary dilatation. Less apparent gallstones on current exam. The gallbladder is physiologically distended which may be due to a fasting state. There may be some minimal biliary sludge along the dependent aspect of the gallbladder. Pancreas: Unremarkable. No pancreatic ductal dilatation or surrounding inflammatory  changes. Spleen: 11 mm homogeneous enhancing well-circumscribed anterior splenic lesion possibly flash filling of a hemangioma. These are not apparent on the unenhanced previous CT or 01/05/2014 MRI. No splenomegaly. Adrenals/Urinary Tract: Normal bilateral adrenal glands. Nonobstructing right upper and interpolar calculi the largest approximately 3 mm in the right upper pole. Nonobstructing left lower pole calculi are also noted the largest approximately 5 mm in length. No hydroureteronephrosis. The urinary bladder is distended with left-sided 2.6 cm bladder diverticulum containing a calculus. Stomach/Bowel: Contracted stomach. There is a large diverticulum off the second portion of the duodenum measuring up to 3.9 cm. Normal small bowel rotation without obstruction. Focus of diverticulitis with pericolonic inflammation at the junction of the descending and sigmoid colon, series 2, image 54. No bowel obstruction, free air or abscess. Vascular/Lymphatic: Aortic atherosclerosis without aneurysm. No lymphadenopathy. Reproductive: Mildly enlarged prostate measuring up to 5.4 cm with peripheral zone calcifications. Seminal vesicles are unremarkable. Other: No abdominal wall hernia or abnormality. No abdominopelvic ascites. Musculoskeletal: Thoracolumbar spondylosis with multilevel degenerative disc space narrowing most prominently at L1-2 and L4-5 with grade 1 anterolisthesis of L4 on L5. No acute nor suspicious osseous lesions. IMPRESSION: 1. Acute uncomplicated diverticulitis at the junction of the descending and sigmoid colon. 2. Stable hepatic cysts. 3. Bilateral nephrolithiasis without obstructive uropathy. Left-sided bladder diverticulum containing a small calculus within. 4. 11 mm homogeneously enhancing anterior splenic lesion, nonspecific but more commonly associated with hemangiomata. No ancillary findings to suggest lymphoma, infection or metastatic disease. 5. Duodenum diverticulum off the second portion. 6.  Mild prostatic enlargement. 7. Aortic atherosclerosis without aneurysm. 8. Thoracolumbar spondylosis. Electronically Signed   By: Ashley Royalty M.D.   On: 03/11/2017 14:58    Procedures Procedures (including critical care time)  Medications Ordered in ED Medications  iopamidol (ISOVUE-300) 61 % injection (  Hold 03/11/17 1451)  sodium chloride 0.9 % bolus 500 mL (0 mLs Intravenous Stopped 03/11/17 1421)  iopamidol (ISOVUE-300) 61 % injection 100 mL (100 mLs Intravenous Contrast Given 03/11/17 1429)  ciprofloxacin (CIPRO) tablet 500 mg (500 mg Oral Given 03/11/17 1533)  metroNIDAZOLE (FLAGYL) tablet 500 mg (500 mg Oral Given 03/11/17 1533)     Initial Impression / Assessment and Plan / ED Course  I have reviewed the triage vital signs and the nursing notes.  Pertinent labs & imaging results that were available during  my care of the patient were reviewed by me and considered in my medical decision making (see chart for details).    Pt here for evaluation of lower abdominal pain with associated constipation/bloating.  Labs with stable pancytopenia secondary to MDS.  CT demonstrates acute diverticulitis without evidence of perforation or abscess.  D/w pt treatment options of antibiotics inpatient/outpatient - patient prefers outpatient treatment.  Discussed home care, close outpatient follow up and return precautions.  Discussed with patient incidental findings on CT scan and need for repeat assessment by PCP.      Final Clinical Impressions(s) / ED Diagnoses   Final diagnoses:  Acute diverticulitis    New Prescriptions Discharge Medication List as of 03/11/2017  3:25 PM    START taking these medications   Details  ciprofloxacin (CIPRO) 500 MG tablet Take 1 tablet (500 mg total) by mouth 2 (two) times daily., Starting Thu 03/11/2017, Print    metroNIDAZOLE (FLAGYL) 500 MG tablet Take 1 tablet (500 mg total) by mouth 3 (three) times daily., Starting Thu 03/11/2017, Print         Quintella Reichert, MD 03/11/17 [phone]

## 2017-03-11 NOTE — ED Notes (Signed)
ED Provider at bedside. 

## 2017-03-11 NOTE — ED Triage Notes (Addendum)
Patient c/o LLQ pain that started around 2am. Patient states that he is on Toradol and making him constipated.  Patient gave himself a Fleets enema this morning with some relief by having BMs.  Patient went to PCP this morning who did rectal exam and informed patient that he is not impacted so needed to go to ED for CT scan of ABD to rule out perforation or diverticulosis.  Patient also having back pain from compression fracture when fell last week. Patient was seen in ED here for fall.

## 2017-03-29 ENCOUNTER — Ambulatory Visit: Payer: Medicare Other

## 2017-03-29 ENCOUNTER — Other Ambulatory Visit (HOSPITAL_BASED_OUTPATIENT_CLINIC_OR_DEPARTMENT_OTHER): Payer: Medicare Other

## 2017-03-29 DIAGNOSIS — D539 Nutritional anemia, unspecified: Secondary | ICD-10-CM

## 2017-03-29 DIAGNOSIS — D469 Myelodysplastic syndrome, unspecified: Secondary | ICD-10-CM

## 2017-03-29 DIAGNOSIS — D708 Other neutropenia: Secondary | ICD-10-CM

## 2017-03-29 LAB — CBC WITH DIFFERENTIAL/PLATELET
BASO%: 0.6 % (ref 0.0–2.0)
Basophils Absolute: 0 10*3/uL (ref 0.0–0.1)
EOS ABS: 0 10*3/uL (ref 0.0–0.5)
EOS%: 1.1 % (ref 0.0–7.0)
HCT: 31.8 % — ABNORMAL LOW (ref 38.4–49.9)
HGB: 10.3 g/dL — ABNORMAL LOW (ref 13.0–17.1)
LYMPH%: 62.9 % — AB (ref 14.0–49.0)
MCH: 33.1 pg (ref 27.2–33.4)
MCHC: 32.4 g/dL (ref 32.0–36.0)
MCV: 102.3 fL — ABNORMAL HIGH (ref 79.3–98.0)
MONO#: 0.3 10*3/uL (ref 0.1–0.9)
MONO%: 16 % — AB (ref 0.0–14.0)
NEUT%: 19.4 % — ABNORMAL LOW (ref 39.0–75.0)
NEUTROS ABS: 0.3 10*3/uL — AB (ref 1.5–6.5)
PLATELETS: 134 10*3/uL — AB (ref 140–400)
RBC: 3.11 10*6/uL — AB (ref 4.20–5.82)
RDW: 20 % — ABNORMAL HIGH (ref 11.0–14.6)
WBC: 1.8 10*3/uL — AB (ref 4.0–10.3)
lymph#: 1.1 10*3/uL (ref 0.9–3.3)
nRBC: 4 % — ABNORMAL HIGH (ref 0–0)

## 2017-03-29 LAB — COMPREHENSIVE METABOLIC PANEL
ALT: 12 U/L (ref 0–55)
ANION GAP: 6 meq/L (ref 3–11)
AST: 16 U/L (ref 5–34)
Albumin: 4.2 g/dL (ref 3.5–5.0)
Alkaline Phosphatase: 73 U/L (ref 40–150)
BUN: 10.8 mg/dL (ref 7.0–26.0)
CHLORIDE: 106 meq/L (ref 98–109)
CO2: 24 meq/L (ref 22–29)
CREATININE: 0.8 mg/dL (ref 0.7–1.3)
Calcium: 9 mg/dL (ref 8.4–10.4)
EGFR: 84 mL/min/{1.73_m2} — ABNORMAL LOW (ref >90)
Glucose: 93 mg/dl (ref 70–140)
Potassium: 4.3 mEq/L (ref 3.5–5.1)
Sodium: 137 mEq/L (ref 136–145)
Total Bilirubin: 0.93 mg/dL (ref 0.20–1.20)
Total Protein: 7.2 g/dL (ref 6.4–8.3)

## 2017-03-29 NOTE — Progress Notes (Signed)
Hemoglobin noted at 1.03 today. No Aranesp injection needed at this time. ANC of .3 noted, pt declined Granix injection. Reviewed neutropenic precautions with patient.

## 2017-04-19 ENCOUNTER — Other Ambulatory Visit: Payer: Medicare Other

## 2017-04-19 ENCOUNTER — Ambulatory Visit: Payer: Medicare Other

## 2017-04-27 ENCOUNTER — Other Ambulatory Visit: Payer: Self-pay | Admitting: Medical Oncology

## 2017-04-27 DIAGNOSIS — D469 Myelodysplastic syndrome, unspecified: Secondary | ICD-10-CM

## 2017-04-28 ENCOUNTER — Telehealth: Payer: Self-pay | Admitting: Medical Oncology

## 2017-04-28 ENCOUNTER — Other Ambulatory Visit (HOSPITAL_BASED_OUTPATIENT_CLINIC_OR_DEPARTMENT_OTHER): Payer: Medicare Other

## 2017-04-28 ENCOUNTER — Ambulatory Visit: Payer: Medicare Other

## 2017-04-28 DIAGNOSIS — D469 Myelodysplastic syndrome, unspecified: Secondary | ICD-10-CM | POA: Diagnosis not present

## 2017-04-28 LAB — COMPREHENSIVE METABOLIC PANEL
ALK PHOS: 52 U/L (ref 40–150)
ALT: 12 U/L (ref 0–55)
AST: 16 U/L (ref 5–34)
Albumin: 4.1 g/dL (ref 3.5–5.0)
Anion Gap: 7 mEq/L (ref 3–11)
BUN: 13.3 mg/dL (ref 7.0–26.0)
CALCIUM: 8.9 mg/dL (ref 8.4–10.4)
CHLORIDE: 107 meq/L (ref 98–109)
CO2: 25 meq/L (ref 22–29)
Creatinine: 0.8 mg/dL (ref 0.7–1.3)
EGFR: 81 mL/min/{1.73_m2} — ABNORMAL LOW (ref >90)
GLUCOSE: 78 mg/dL (ref 70–140)
POTASSIUM: 4.6 meq/L (ref 3.5–5.1)
SODIUM: 139 meq/L (ref 136–145)
Total Bilirubin: 1.04 mg/dL (ref 0.20–1.20)
Total Protein: 7 g/dL (ref 6.4–8.3)

## 2017-04-28 LAB — CBC WITH DIFFERENTIAL/PLATELET
BASO%: 0.6 % (ref 0.0–2.0)
BASOS ABS: 0 10*3/uL (ref 0.0–0.1)
EOS%: 0.6 % (ref 0.0–7.0)
Eosinophils Absolute: 0 10*3/uL (ref 0.0–0.5)
HEMATOCRIT: 30.5 % — AB (ref 38.4–49.9)
HGB: 10 g/dL — ABNORMAL LOW (ref 13.0–17.1)
LYMPH#: 1.2 10*3/uL (ref 0.9–3.3)
LYMPH%: 69.7 % — ABNORMAL HIGH (ref 14.0–49.0)
MCH: 33.7 pg — AB (ref 27.2–33.4)
MCHC: 32.8 g/dL (ref 32.0–36.0)
MCV: 102.7 fL — ABNORMAL HIGH (ref 79.3–98.0)
MONO#: 0.2 10*3/uL (ref 0.1–0.9)
MONO%: 13.9 % (ref 0.0–14.0)
NEUT#: 0.3 10*3/uL — CL (ref 1.5–6.5)
NEUT%: 15.2 % — AB (ref 39.0–75.0)
Platelets: 123 10*3/uL — ABNORMAL LOW (ref 140–400)
RBC: 2.97 10*6/uL — AB (ref 4.20–5.82)
RDW: 20.5 % — ABNORMAL HIGH (ref 11.0–14.6)
WBC: 1.7 10*3/uL — ABNORMAL LOW (ref 4.0–10.3)

## 2017-04-28 LAB — TECHNOLOGIST REVIEW

## 2017-04-28 NOTE — Telephone Encounter (Signed)
I left message for wife to call back if pt wants granix.

## 2017-04-28 NOTE — Telephone Encounter (Signed)
Pamala Hurry understood pt decline granix today and will discuss with Mohamed at next visit.

## 2017-04-28 NOTE — Progress Notes (Signed)
Hemoglobin at 10.0 today, no injection needed. Neutrophils noted at  0.3 today. Pt denied needed for granix at this time.

## 2017-05-07 ENCOUNTER — Other Ambulatory Visit: Payer: Self-pay | Admitting: Medical Oncology

## 2017-05-07 DIAGNOSIS — D469 Myelodysplastic syndrome, unspecified: Secondary | ICD-10-CM

## 2017-05-10 ENCOUNTER — Ambulatory Visit: Payer: Medicare Other

## 2017-05-10 ENCOUNTER — Ambulatory Visit (HOSPITAL_BASED_OUTPATIENT_CLINIC_OR_DEPARTMENT_OTHER): Payer: Medicare Other | Admitting: Internal Medicine

## 2017-05-10 ENCOUNTER — Encounter: Payer: Self-pay | Admitting: Internal Medicine

## 2017-05-10 ENCOUNTER — Other Ambulatory Visit (HOSPITAL_BASED_OUTPATIENT_CLINIC_OR_DEPARTMENT_OTHER): Payer: Medicare Other

## 2017-05-10 VITALS — BP 118/48 | HR 89 | Temp 98.2°F | Resp 18 | Ht 72.0 in | Wt 170.7 lb

## 2017-05-10 DIAGNOSIS — D708 Other neutropenia: Secondary | ICD-10-CM

## 2017-05-10 DIAGNOSIS — D61818 Other pancytopenia: Secondary | ICD-10-CM | POA: Diagnosis not present

## 2017-05-10 DIAGNOSIS — D539 Nutritional anemia, unspecified: Secondary | ICD-10-CM

## 2017-05-10 DIAGNOSIS — D469 Myelodysplastic syndrome, unspecified: Secondary | ICD-10-CM

## 2017-05-10 DIAGNOSIS — D4620 Refractory anemia with excess of blasts, unspecified: Secondary | ICD-10-CM

## 2017-05-10 LAB — COMPREHENSIVE METABOLIC PANEL
ALT: 12 U/L (ref 0–55)
ANION GAP: 8 meq/L (ref 3–11)
AST: 16 U/L (ref 5–34)
Albumin: 4.2 g/dL (ref 3.5–5.0)
Alkaline Phosphatase: 53 U/L (ref 40–150)
BUN: 16.5 mg/dL (ref 7.0–26.0)
CALCIUM: 9.5 mg/dL (ref 8.4–10.4)
CHLORIDE: 105 meq/L (ref 98–109)
CO2: 26 mEq/L (ref 22–29)
CREATININE: 0.8 mg/dL (ref 0.7–1.3)
EGFR: 81 mL/min/{1.73_m2} — ABNORMAL LOW (ref >90)
Glucose: 100 mg/dl (ref 70–140)
POTASSIUM: 5 meq/L (ref 3.5–5.1)
Sodium: 139 mEq/L (ref 136–145)
Total Bilirubin: 0.99 mg/dL (ref 0.20–1.20)
Total Protein: 7.2 g/dL (ref 6.4–8.3)

## 2017-05-10 LAB — CBC WITH DIFFERENTIAL/PLATELET
BASO%: 0.5 % (ref 0.0–2.0)
BASOS ABS: 0 10*3/uL (ref 0.0–0.1)
EOS ABS: 0 10*3/uL (ref 0.0–0.5)
EOS%: 0.5 % (ref 0.0–7.0)
HCT: 30.3 % — ABNORMAL LOW (ref 38.4–49.9)
HGB: 10 g/dL — ABNORMAL LOW (ref 13.0–17.1)
LYMPH#: 1.1 10*3/uL (ref 0.9–3.3)
LYMPH%: 58.4 % — AB (ref 14.0–49.0)
MCH: 33.8 pg — AB (ref 27.2–33.4)
MCHC: 33 g/dL (ref 32.0–36.0)
MCV: 102.4 fL — AB (ref 79.3–98.0)
MONO#: 0.4 10*3/uL (ref 0.1–0.9)
MONO%: 18.9 % — ABNORMAL HIGH (ref 0.0–14.0)
NEUT#: 0.4 10*3/uL — CL (ref 1.5–6.5)
NEUT%: 21.7 % — AB (ref 39.0–75.0)
PLATELETS: 112 10*3/uL — AB (ref 140–400)
RBC: 2.96 10*6/uL — ABNORMAL LOW (ref 4.20–5.82)
RDW: 20.8 % — ABNORMAL HIGH (ref 11.0–14.6)
WBC: 1.9 10*3/uL — ABNORMAL LOW (ref 4.0–10.3)
nRBC: 4 % — ABNORMAL HIGH (ref 0–0)

## 2017-05-10 NOTE — Progress Notes (Signed)
Injection Appt canceled perDr. Julien Nordmann

## 2017-05-10 NOTE — Progress Notes (Signed)
[facility] [phone]   Fax:([phone]  OFFICE PROGRESS NOTE  Lorene Dy, MD [address] [place] [address] [place] [address]  DIAGNOSIS: 1)  myelodysplastic syndrome consistent with refractory anemia with excess blast diagnosed in December 2016 2)  History of ITP  PRIOR THERAPY: Vidaza 75 mg/m2 IV for 5 days every 4 weeks. Status post 3 cycles.  CURRENT THERAPY:  1) Supportive care with Aranesp 300 g subcutaneously every 3 weeks in addition to Granix 300 mcg subcutaneously for neutropenia as needed.  INTERVAL HISTORY: Dean Stanley 83 y.o. male returns to the clinic today for follow-up visit accompanied by his wife. The patient is feeling fine today with no specific complaints. He continues to have mild fatigue and shortness breath with exertion. He denied having any chest pain, cough or hemoptysis. He denied having any fever or chills. He has no nausea, vomiting, diarrhea or constipation. He denied having any bleeding disorder. He is less today for evaluation and repeat blood work.   MEDICAL HISTORY: Past Medical History:  Diagnosis Date  . Anemia   . Cancer (HCC)    myelodysplastic syndrome  . Complication of anesthesia    Reaction to anesthesia " things went haywire around me"  . Do not resuscitate 11/04/2015  . Hearing impairment    wears hearing left ear  . History of ITP 1960  . Hypothyroidism   . Meniere's syndrome 1980s  . Port catheter in place 12/23/2015    ALLERGIES:  is allergic to percocet [oxycodone-acetaminophen]; hydrocodone; and oxycodone.  MEDICATIONS:  Current Outpatient Prescriptions  Medication Sig Dispense Refill  . Darbepoetin Alfa (ARANESP) 300 MCG/0.6ML SOSY injection Inject 300 mcg into the skin every 30 (thirty) days.    Marland Kitchen desmopressin (DDAVP) 0.2 MG tablet Take 0.2 mg by mouth daily.    Marland Kitchen docusate calcium (KAO-TIN) 240 MG capsule Take 240 mg by mouth daily.    . ferrous sulfate 325 (65 FE) MG tablet  Take 325 mg by mouth daily with breakfast.    . finasteride (PROPECIA) 1 MG tablet Take 1 mg by mouth daily with breakfast.     . levothyroxine (SYNTHROID, LEVOTHROID) 200 MCG tablet Take 200 mcg by mouth at bedtime.     . Lutein 40 MG CAPS Take 40 mg by mouth daily with breakfast.     . Multiple Vitamins-Minerals (CENTRUM ADULTS PO) Take 1 tablet by mouth daily.     Marland Kitchen senna-docusate (SENNA S) 8.6-50 MG tablet Take 2 tablets by mouth every 6 (six) hours as needed for mild constipation.    . Tbo-Filgrastim (GRANIX) 300 MCG/0.5ML SOSY injection Inject 300 mcg into the skin every 30 (thirty) days.    . traMADol (ULTRAM) 50 MG tablet Take 50 mg by mouth 3 (three) times daily as needed for moderate pain or severe pain.     . traMADol (ULTRAM) 50 MG tablet Take 1 tablet (50 mg total) by mouth every 6 (six) hours as needed. 20 tablet 0  . triamcinolone (KENALOG) 0.025 % cream Apply 1 application topically 2 (two) times daily as needed (for inflammation).      No current facility-administered medications for this visit.    Facility-Administered Medications Ordered in Other Visits  Medication Dose Route Frequency Provider Last Rate Last Dose  . azaCITIDine (VIDAZA) 150 mg in lactated ringers 45 mL chemo infusion  75 mg/m2 (Treatment Plan Recorded) Intravenous Daily Curt Bears, MD   150 mg at 12/02/15 1428  . Darbepoetin Alfa (  ARANESP) injection 300 mcg  300 mcg Subcutaneous Once Curt Bears, MD      . heparin lock flush 100 unit/mL  500 Units Intracatheter Once PRN Curt Bears, MD      . sodium chloride 0.9 % injection 10 mL  10 mL Intracatheter PRN Curt Bears, MD        SURGICAL HISTORY:  Past Surgical History:  Procedure Laterality Date  . cataract Right 2011  . CIRCUMCISION    . COLONOSCOPY    . cortisone injections to back x 6    . ORIF ORBITAL FRACTURE Left 2000   2 fractures orbital platform    REVIEW OF SYSTEMS:  A comprehensive review of systems was negative  except for: Constitutional: positive for fatigue Respiratory: positive for dyspnea on exertion   PHYSICAL EXAMINATION: General appearance: alert, cooperative, fatigued and no distress Head: Normocephalic, without obvious abnormality, atraumatic Neck: no adenopathy, no JVD, supple, symmetrical, trachea midline and thyroid not enlarged, symmetric, no tenderness/mass/nodules Lymph nodes: Cervical, supraclavicular, and axillary nodes normal. Resp: clear to auscultation bilaterally Back: symmetric, no curvature. ROM normal. No CVA tenderness. Cardio: regular rate and rhythm, S1, S2 normal, no murmur, click, rub or gallop GI: soft, non-tender; bowel sounds normal; no masses,  no organomegaly Extremities: extremities normal, atraumatic, no cyanosis or edema  ECOG PERFORMANCE STATUS: 1 - Symptomatic but completely ambulatory  Blood pressure (!) 118/48, pulse 89, temperature 98.2 F (36.8 C), temperature source Oral, resp. rate 18, height 6' (1.829 m), weight 170 lb 11.2 oz (77.4 kg), SpO2 96 %.  LABORATORY DATA: Lab Results  Component Value Date   WBC 1.9 (L) 05/10/2017   HGB 10.0 (L) 05/10/2017   HCT 30.3 (L) 05/10/2017   MCV 102.4 (H) 05/10/2017   PLT 112 (L) 05/10/2017      Chemistry      Component Value Date/Time   NA 139 04/28/2017 0907   K 4.6 04/28/2017 0907   CL 102 03/11/2017 1316   CO2 25 04/28/2017 0907   BUN 13.3 04/28/2017 0907   CREATININE 0.8 04/28/2017 0907      Component Value Date/Time   CALCIUM 8.9 04/28/2017 0907   ALKPHOS 52 04/28/2017 0907   AST 16 04/28/2017 0907   ALT 12 04/28/2017 0907   BILITOT 1.04 04/28/2017 0907       RADIOGRAPHIC STUDIES: No results found.  ASSESSMENT AND PLAN:   This is a very pleasant 83 years old white male with myelodysplastic syndrome consistent with refractory anemia with excess blasts status post systemic chemotherapy with Vidaza 3 cycles and has been observation since that time. His CBC today showed persistent  pancytopenia consistent with his condition with no worsening of his hemoglobin or hematocrit. I gave the patient the option of treatment with Granix for his neutropenia but he declined. He also did not require any Aranesp injection in the last few months. He will continue on Aranesp 300 g subcutaneously every 3 weeks as needed for anemia with hemoglobin less than 10.0 G/DL. I will see him back for follow-up visit in 3 months for reevaluation with repeat blood work. He was advised to call immediately if he has any concerning symptoms in the interval. The patient voices understanding of current disease status and treatment options and is in agreement with the current care plan. All questions were answered. The patient knows to call the clinic with any problems, questions or concerns. We can certainly see the patient much sooner if necessary. I spent 10 minutes counseling the patient  face to face. The total time spent in the appointment was 15 minutes.  Disclaimer: This note was dictated with voice recognition software. Similar sounding words can inadvertently be transcribed and may not be corrected upon review.

## 2017-06-01 ENCOUNTER — Ambulatory Visit: Payer: Medicare Other

## 2017-06-01 ENCOUNTER — Other Ambulatory Visit (HOSPITAL_BASED_OUTPATIENT_CLINIC_OR_DEPARTMENT_OTHER): Payer: Medicare Other

## 2017-06-01 ENCOUNTER — Other Ambulatory Visit: Payer: Self-pay | Admitting: *Deleted

## 2017-06-01 DIAGNOSIS — D469 Myelodysplastic syndrome, unspecified: Secondary | ICD-10-CM | POA: Diagnosis present

## 2017-06-01 LAB — CBC WITH DIFFERENTIAL/PLATELET
BASO%: 1 % (ref 0.0–2.0)
Basophils Absolute: 0 10*3/uL (ref 0.0–0.1)
EOS ABS: 0 10*3/uL (ref 0.0–0.5)
EOS%: 0.9 % (ref 0.0–7.0)
HEMATOCRIT: 29.8 % — AB (ref 38.4–49.9)
HEMOGLOBIN: 10.1 g/dL — AB (ref 13.0–17.1)
LYMPH%: 59 % — ABNORMAL HIGH (ref 14.0–49.0)
MCH: 34.3 pg — ABNORMAL HIGH (ref 27.2–33.4)
MCHC: 33.8 g/dL (ref 32.0–36.0)
MCV: 101.5 fL — ABNORMAL HIGH (ref 79.3–98.0)
MONO#: 0.3 10*3/uL (ref 0.1–0.9)
MONO%: 16.9 % — ABNORMAL HIGH (ref 0.0–14.0)
NEUT%: 22.2 % — AB (ref 39.0–75.0)
NEUTROS ABS: 0.4 10*3/uL — AB (ref 1.5–6.5)
Platelets: 137 10*3/uL — ABNORMAL LOW (ref 140–400)
RBC: 2.94 10*6/uL — ABNORMAL LOW (ref 4.20–5.82)
RDW: 22.8 % — AB (ref 11.0–14.6)
WBC: 1.9 10*3/uL — ABNORMAL LOW (ref 4.0–10.3)
lymph#: 1.1 10*3/uL (ref 0.9–3.3)
nRBC: 2 % — ABNORMAL HIGH (ref 0–0)

## 2017-06-01 LAB — COMPREHENSIVE METABOLIC PANEL
ALT: 11 U/L (ref 0–55)
AST: 15 U/L (ref 5–34)
Albumin: 3.9 g/dL (ref 3.5–5.0)
Alkaline Phosphatase: 44 U/L (ref 40–150)
Anion Gap: 4 mEq/L (ref 3–11)
BUN: 14.8 mg/dL (ref 7.0–26.0)
CHLORIDE: 106 meq/L (ref 98–109)
CO2: 28 meq/L (ref 22–29)
CREATININE: 0.8 mg/dL (ref 0.7–1.3)
Calcium: 9.3 mg/dL (ref 8.4–10.4)
EGFR: 84 mL/min/{1.73_m2} — ABNORMAL LOW (ref >90)
GLUCOSE: 93 mg/dL (ref 70–140)
POTASSIUM: 4.4 meq/L (ref 3.5–5.1)
SODIUM: 138 meq/L (ref 136–145)
Total Bilirubin: 0.87 mg/dL (ref 0.20–1.20)
Total Protein: 7 g/dL (ref 6.4–8.3)

## 2017-06-01 NOTE — Progress Notes (Signed)
Hemoglobin noted at 10.1 today. No injection needed at this time. Pt given copy of labs.

## 2017-06-21 ENCOUNTER — Other Ambulatory Visit: Payer: Self-pay | Admitting: Medical Oncology

## 2017-06-21 DIAGNOSIS — D469 Myelodysplastic syndrome, unspecified: Secondary | ICD-10-CM

## 2017-06-22 ENCOUNTER — Other Ambulatory Visit (HOSPITAL_BASED_OUTPATIENT_CLINIC_OR_DEPARTMENT_OTHER): Payer: Medicare Other

## 2017-06-22 ENCOUNTER — Ambulatory Visit (HOSPITAL_BASED_OUTPATIENT_CLINIC_OR_DEPARTMENT_OTHER): Payer: Medicare Other

## 2017-06-22 VITALS — BP 127/59 | HR 75 | Temp 98.0°F | Resp 16

## 2017-06-22 DIAGNOSIS — D469 Myelodysplastic syndrome, unspecified: Secondary | ICD-10-CM

## 2017-06-22 DIAGNOSIS — D539 Nutritional anemia, unspecified: Secondary | ICD-10-CM

## 2017-06-22 LAB — CBC WITH DIFFERENTIAL/PLATELET
BASO%: 1.2 % (ref 0.0–2.0)
BASOS ABS: 0 10*3/uL (ref 0.0–0.1)
EOS ABS: 0 10*3/uL (ref 0.0–0.5)
EOS%: 0.6 % (ref 0.0–7.0)
HCT: 27.8 % — ABNORMAL LOW (ref 38.4–49.9)
HEMOGLOBIN: 8.9 g/dL — AB (ref 13.0–17.1)
LYMPH%: 56.7 % — ABNORMAL HIGH (ref 14.0–49.0)
MCH: 32.7 pg (ref 27.2–33.4)
MCHC: 32 g/dL (ref 32.0–36.0)
MCV: 102.2 fL — AB (ref 79.3–98.0)
MONO#: 0.4 10*3/uL (ref 0.1–0.9)
MONO%: 21.3 % — AB (ref 0.0–14.0)
NEUT#: 0.3 10*3/uL — CL (ref 1.5–6.5)
NEUT%: 20.2 % — ABNORMAL LOW (ref 39.0–75.0)
NRBC: 3 % — AB (ref 0–0)
PLATELETS: 114 10*3/uL — AB (ref 140–400)
RBC: 2.72 10*6/uL — ABNORMAL LOW (ref 4.20–5.82)
RDW: 21.1 % — AB (ref 11.0–14.6)
WBC: 1.6 10*3/uL — ABNORMAL LOW (ref 4.0–10.3)
lymph#: 0.9 10*3/uL (ref 0.9–3.3)

## 2017-06-22 MED ORDER — DARBEPOETIN ALFA 300 MCG/0.6ML IJ SOSY
300.0000 ug | PREFILLED_SYRINGE | Freq: Once | INTRAMUSCULAR | Status: AC
  Administered 2017-06-22: 300 ug via SUBCUTANEOUS
  Filled 2017-06-22: qty 0.6

## 2017-07-12 ENCOUNTER — Other Ambulatory Visit: Payer: Self-pay | Admitting: Medical Oncology

## 2017-07-12 DIAGNOSIS — D469 Myelodysplastic syndrome, unspecified: Secondary | ICD-10-CM

## 2017-07-13 ENCOUNTER — Ambulatory Visit: Payer: Medicare Other

## 2017-07-13 ENCOUNTER — Other Ambulatory Visit (HOSPITAL_BASED_OUTPATIENT_CLINIC_OR_DEPARTMENT_OTHER): Payer: Medicare Other

## 2017-07-13 DIAGNOSIS — D469 Myelodysplastic syndrome, unspecified: Secondary | ICD-10-CM

## 2017-07-13 LAB — CBC WITH DIFFERENTIAL/PLATELET
BASO%: 0.7 % (ref 0.0–2.0)
Basophils Absolute: 0 10*3/uL (ref 0.0–0.1)
EOS%: 1.3 % (ref 0.0–7.0)
Eosinophils Absolute: 0 10*3/uL (ref 0.0–0.5)
HCT: 33.3 % — ABNORMAL LOW (ref 38.4–49.9)
HGB: 10.6 g/dL — ABNORMAL LOW (ref 13.0–17.1)
LYMPH%: 75.3 % — ABNORMAL HIGH (ref 14.0–49.0)
MCH: 32.7 pg (ref 27.2–33.4)
MCHC: 31.8 g/dL — ABNORMAL LOW (ref 32.0–36.0)
MCV: 102.8 fL — ABNORMAL HIGH (ref 79.3–98.0)
MONO#: 0 10*3/uL — ABNORMAL LOW (ref 0.1–0.9)
MONO%: 0.7 % (ref 0.0–14.0)
NEUT%: 22 % — ABNORMAL LOW (ref 39.0–75.0)
NEUTROS ABS: 0.3 10*3/uL — AB (ref 1.5–6.5)
NRBC: 3 % — AB (ref 0–0)
Platelets: 134 10*3/uL — ABNORMAL LOW (ref 140–400)
RBC: 3.24 10*6/uL — AB (ref 4.20–5.82)
RDW: 21.3 % — AB (ref 11.0–14.6)
WBC: 1.5 10*3/uL — AB (ref 4.0–10.3)
lymph#: 1.1 10*3/uL (ref 0.9–3.3)

## 2017-07-13 NOTE — Progress Notes (Signed)
Hemoglobin noted at 10.6 on labs. No injection needed per protocol order. Pt given copy of labs and current schedule. Instructed to follow schedule and call our office should issues occur.

## 2017-07-20 ENCOUNTER — Other Ambulatory Visit: Payer: Medicare Other

## 2017-07-20 ENCOUNTER — Ambulatory Visit: Payer: Medicare Other

## 2017-07-27 ENCOUNTER — Ambulatory Visit: Payer: Medicare Other

## 2017-07-27 ENCOUNTER — Other Ambulatory Visit: Payer: Medicare Other

## 2017-08-03 ENCOUNTER — Ambulatory Visit (HOSPITAL_BASED_OUTPATIENT_CLINIC_OR_DEPARTMENT_OTHER): Payer: Medicare Other | Admitting: Internal Medicine

## 2017-08-03 ENCOUNTER — Ambulatory Visit: Payer: Medicare Other

## 2017-08-03 ENCOUNTER — Other Ambulatory Visit: Payer: Medicare Other

## 2017-08-03 ENCOUNTER — Encounter: Payer: Self-pay | Admitting: Internal Medicine

## 2017-08-03 ENCOUNTER — Other Ambulatory Visit (HOSPITAL_BASED_OUTPATIENT_CLINIC_OR_DEPARTMENT_OTHER): Payer: Medicare Other

## 2017-08-03 ENCOUNTER — Ambulatory Visit (HOSPITAL_BASED_OUTPATIENT_CLINIC_OR_DEPARTMENT_OTHER): Payer: Medicare Other

## 2017-08-03 VITALS — BP 116/59 | HR 89 | Temp 98.0°F | Resp 18

## 2017-08-03 VITALS — BP 122/60 | HR 83 | Temp 98.7°F | Resp 18 | Ht 72.0 in | Wt 164.9 lb

## 2017-08-03 DIAGNOSIS — D539 Nutritional anemia, unspecified: Secondary | ICD-10-CM

## 2017-08-03 DIAGNOSIS — D469 Myelodysplastic syndrome, unspecified: Secondary | ICD-10-CM

## 2017-08-03 DIAGNOSIS — R530 Neoplastic (malignant) related fatigue: Secondary | ICD-10-CM

## 2017-08-03 DIAGNOSIS — D4620 Refractory anemia with excess of blasts, unspecified: Secondary | ICD-10-CM

## 2017-08-03 DIAGNOSIS — D708 Other neutropenia: Secondary | ICD-10-CM

## 2017-08-03 LAB — COMPREHENSIVE METABOLIC PANEL
ALBUMIN: 4.1 g/dL (ref 3.5–5.0)
ALT: 10 U/L (ref 0–55)
AST: 15 U/L (ref 5–34)
Alkaline Phosphatase: 52 U/L (ref 40–150)
Anion Gap: 6 mEq/L (ref 3–11)
BILIRUBIN TOTAL: 0.94 mg/dL (ref 0.20–1.20)
BUN: 12.8 mg/dL (ref 7.0–26.0)
CO2: 25 meq/L (ref 22–29)
CREATININE: 0.8 mg/dL (ref 0.7–1.3)
Calcium: 8.8 mg/dL (ref 8.4–10.4)
Chloride: 107 mEq/L (ref 98–109)
EGFR: >60 mL/min/{1.73_m2} (ref >60)
GLUCOSE: 95 mg/dL (ref 70–140)
Potassium: 4.3 mEq/L (ref 3.5–5.1)
SODIUM: 139 meq/L (ref 136–145)
TOTAL PROTEIN: 7.1 g/dL (ref 6.4–8.3)

## 2017-08-03 LAB — CBC WITH DIFFERENTIAL/PLATELET
BASO%: 0.6 % (ref 0.0–2.0)
BASOS ABS: 0 10*3/uL (ref 0.0–0.1)
EOS%: 1.2 % (ref 0.0–7.0)
Eosinophils Absolute: 0 10*3/uL (ref 0.0–0.5)
HEMATOCRIT: 29.5 % — AB (ref 38.4–49.9)
HGB: 9.5 g/dL — ABNORMAL LOW (ref 13.0–17.1)
LYMPH%: 48.4 % (ref 14.0–49.0)
MCH: 33 pg (ref 27.2–33.4)
MCHC: 32.2 g/dL (ref 32.0–36.0)
MCV: 102.4 fL — ABNORMAL HIGH (ref 79.3–98.0)
MONO#: 0.4 10*3/uL (ref 0.1–0.9)
MONO%: 22.4 % — AB (ref 0.0–14.0)
NEUT#: 0.4 10*3/uL — CL (ref 1.5–6.5)
NEUT%: 27.4 % — AB (ref 39.0–75.0)
Platelets: 105 10*3/uL — ABNORMAL LOW (ref 140–400)
RBC: 2.88 10*6/uL — AB (ref 4.20–5.82)
RDW: 21.1 % — ABNORMAL HIGH (ref 11.0–14.6)
WBC: 1.6 10*3/uL — ABNORMAL LOW (ref 4.0–10.3)
lymph#: 0.8 10*3/uL — ABNORMAL LOW (ref 0.9–3.3)

## 2017-08-03 LAB — LACTATE DEHYDROGENASE: LDH: 193 U/L (ref 125–245)

## 2017-08-03 MED ORDER — DARBEPOETIN ALFA 300 MCG/0.6ML IJ SOSY
300.0000 ug | PREFILLED_SYRINGE | Freq: Once | INTRAMUSCULAR | Status: AC
  Administered 2017-08-03: 300 ug via SUBCUTANEOUS
  Filled 2017-08-03: qty 0.6

## 2017-08-03 NOTE — Progress Notes (Signed)
[facility] [phone]   [phone]  OFFICE PROGRESS NOTE  Lorene Dy, MD [address] [place] [address] [place] [address]  DIAGNOSIS: 1)  myelodysplastic syndrome consistent with refractory anemia with excess blast diagnosed in December 2016 2)  History of ITP  PRIOR THERAPY: Vidaza 75 mg/m2 IV for 5 days every 4 weeks. Status post 3 cycles.  CURRENT THERAPY:  1) Supportive care with Aranesp 300 g subcutaneously every 3 weeks in addition to Granix 300 mcg subcutaneously for neutropenia as needed.  INTERVAL HISTORY: Dean Stanley 83 y.o. male returns to the clinic today for 3 months follow-up visit.  The patient continues to complain of fatigue.  He denied having any recent fever or chills.  He has no nausea, vomiting, diarrhea or constipation.  He has no chest pain, shortness of breath, cough or hemoptysis.  He is here today for evaluation after repeating CBC, comprehensive metabolic panel and LDH.    MEDICAL HISTORY: Past Medical History:  Diagnosis Date  . Anemia   . Cancer (HCC)    myelodysplastic syndrome  . Complication of anesthesia    Reaction to anesthesia " things went haywire around me"  . Do not resuscitate 11/04/2015  . Hearing impairment    wears hearing left ear  . History of ITP 1960  . Hypothyroidism   . Meniere's syndrome 1980s  . Port catheter in place 12/23/2015    ALLERGIES:  is allergic to percocet [oxycodone-acetaminophen]; hydrocodone; and oxycodone.  MEDICATIONS:  Current Outpatient Medications  Medication Sig Dispense Refill  . Darbepoetin Alfa (ARANESP) 300 MCG/0.6ML SOSY injection Inject 300 mcg into the skin every 30 (thirty) days.    Marland Kitchen desmopressin (DDAVP) 0.2 MG tablet Take 0.2 mg by mouth daily.    Marland Kitchen docusate calcium (KAO-TIN) 240 MG capsule Take 240 mg by mouth daily.    . ferrous sulfate 325 (65 FE) MG tablet Take 325 mg by mouth daily with breakfast.    . finasteride (PROPECIA) 1 MG tablet  Take 1 mg by mouth daily with breakfast.     . levothyroxine (SYNTHROID, LEVOTHROID) 200 MCG tablet Take 200 mcg by mouth at bedtime.     . Lutein 40 MG CAPS Take 40 mg by mouth daily with breakfast.     . Multiple Vitamins-Minerals (CENTRUM ADULTS PO) Take 1 tablet by mouth daily.     . Tbo-Filgrastim (GRANIX) 300 MCG/0.5ML SOSY injection Inject 300 mcg into the skin every 30 (thirty) days.    Marland Kitchen triamcinolone (KENALOG) 0.025 % cream Apply 1 application topically 2 (two) times daily as needed (for inflammation).      No current facility-administered medications for this visit.    Facility-Administered Medications Ordered in Other Visits  Medication Dose Route Frequency Provider Last Rate Last Dose  . azaCITIDine (VIDAZA) 150 mg in lactated ringers 45 mL chemo infusion  75 mg/m2 (Treatment Plan Recorded) Intravenous Daily Curt Bears, MD   150 mg at 12/02/15 1428  . Darbepoetin Alfa (ARANESP) injection 300 mcg  300 mcg Subcutaneous Once Curt Bears, MD      . heparin lock flush 100 unit/mL  500 Units Intracatheter Once PRN Curt Bears, MD      . sodium chloride 0.9 % injection 10 mL  10 mL Intracatheter PRN Curt Bears, MD        SURGICAL HISTORY:  Past Surgical History:  Procedure Laterality Date  . cataract Right 2011  . CIRCUMCISION    . COLONOSCOPY    .  cortisone injections to back x 6    . ORIF ORBITAL FRACTURE Left 2000   2 fractures orbital platform    REVIEW OF SYSTEMS:  A comprehensive review of systems was negative except for: Constitutional: positive for fatigue   PHYSICAL EXAMINATION: General appearance: alert, cooperative, fatigued and no distress Head: Normocephalic, without obvious abnormality, atraumatic Neck: no adenopathy, no JVD, supple, symmetrical, trachea midline and thyroid not enlarged, symmetric, no tenderness/mass/nodules Lymph nodes: Cervical, supraclavicular, and axillary nodes normal. Resp: clear to auscultation bilaterally Back:  symmetric, no curvature. ROM normal. No CVA tenderness. Cardio: regular rate and rhythm, S1, S2 normal, no murmur, click, rub or gallop GI: soft, non-tender; bowel sounds normal; no masses,  no organomegaly Extremities: extremities normal, atraumatic, no cyanosis or edema  ECOG PERFORMANCE STATUS: 1 - Symptomatic but completely ambulatory  Blood pressure 122/60, pulse 83, temperature 98.7 F (37.1 C), temperature source Oral, resp. rate 18, height 6' (1.829 m), weight 164 lb 14.4 oz (74.8 kg), SpO2 96 %.  LABORATORY DATA: Lab Results  Component Value Date   WBC 1.6 (L) 08/03/2017   HGB 9.5 (L) 08/03/2017   HCT 29.5 (L) 08/03/2017   MCV 102.4 (H) 08/03/2017   PLT 105 (L) 08/03/2017      Chemistry      Component Value Date/Time   NA 139 08/03/2017 0943   K 4.3 08/03/2017 0943   CL 102 03/11/2017 1316   CO2 25 08/03/2017 0943   BUN 12.8 08/03/2017 0943   CREATININE 0.8 08/03/2017 0943      Component Value Date/Time   CALCIUM 8.8 08/03/2017 0943   ALKPHOS 52 08/03/2017 0943   AST 15 08/03/2017 0943   ALT 10 08/03/2017 0943   BILITOT 0.94 08/03/2017 0943       RADIOGRAPHIC STUDIES: No results found.  ASSESSMENT AND PLAN:   This is a very pleasant 83 years old white male with myelodysplastic syndrome consistent with refractory anemia with excess blasts status post systemic chemotherapy with Vidaza 3 cycles and has been observation since that time. The patient continues to do well except for fatigue. CBC today showed the persistent pancytopenia.  There is no worsening compared to previous blood work. I recommended for the patient to continue on Aranesp for the anemia. I would see him back for follow-up visit in 3 months for evaluation with repeat blood work. The patient was advised to call immediately if he has any concerning symptoms in the interval. The patient voices understanding of current disease status and treatment options and is in agreement with the current care  plan. All questions were answered. The patient knows to call the clinic with any problems, questions or concerns. We can certainly see the patient much sooner if necessary. I spent 10 minutes counseling the patient face to face. The total time spent in the appointment was 15 minutes.  Disclaimer: This note was dictated with voice recognition software. Similar sounding words can inadvertently be transcribed and may not be corrected upon review.

## 2017-08-03 NOTE — Patient Instructions (Signed)

## 2017-08-09 ENCOUNTER — Telehealth: Payer: Self-pay | Admitting: Internal Medicine

## 2017-08-09 NOTE — Telephone Encounter (Signed)
Scheduled appt per 11/06 los.- patient came in to scheduling to schedule appts per los - gave patient  Calender.

## 2017-08-10 ENCOUNTER — Other Ambulatory Visit: Payer: Medicare Other

## 2017-08-10 ENCOUNTER — Ambulatory Visit: Payer: Medicare Other

## 2017-08-10 ENCOUNTER — Ambulatory Visit: Payer: Medicare Other | Admitting: Internal Medicine

## 2017-08-25 ENCOUNTER — Ambulatory Visit (HOSPITAL_BASED_OUTPATIENT_CLINIC_OR_DEPARTMENT_OTHER): Payer: Medicare Other

## 2017-08-25 ENCOUNTER — Other Ambulatory Visit: Payer: Self-pay | Admitting: Medical Oncology

## 2017-08-25 ENCOUNTER — Other Ambulatory Visit (HOSPITAL_BASED_OUTPATIENT_CLINIC_OR_DEPARTMENT_OTHER): Payer: Medicare Other

## 2017-08-25 VITALS — BP 135/65 | HR 80 | Temp 98.0°F | Resp 20

## 2017-08-25 DIAGNOSIS — D539 Nutritional anemia, unspecified: Secondary | ICD-10-CM

## 2017-08-25 DIAGNOSIS — D469 Myelodysplastic syndrome, unspecified: Secondary | ICD-10-CM

## 2017-08-25 LAB — CBC WITH DIFFERENTIAL/PLATELET
BASO%: 0.6 % (ref 0.0–2.0)
Basophils Absolute: 0 10*3/uL (ref 0.0–0.1)
EOS ABS: 0 10*3/uL (ref 0.0–0.5)
EOS%: 1.2 % (ref 0.0–7.0)
HCT: 29.5 % — ABNORMAL LOW (ref 38.4–49.9)
HGB: 9.3 g/dL — ABNORMAL LOW (ref 13.0–17.1)
LYMPH%: 38 % (ref 14.0–49.0)
MCH: 32.1 pg (ref 27.2–33.4)
MCHC: 31.5 g/dL — AB (ref 32.0–36.0)
MCV: 101.7 fL — AB (ref 79.3–98.0)
MONO#: 0.2 10*3/uL (ref 0.1–0.9)
MONO%: 12.9 % (ref 0.0–14.0)
NEUT%: 47.3 % (ref 39.0–75.0)
NEUTROS ABS: 0.8 10*3/uL — AB (ref 1.5–6.5)
PLATELETS: 156 10*3/uL (ref 140–400)
RBC: 2.9 10*6/uL — AB (ref 4.20–5.82)
RDW: 21.6 % — ABNORMAL HIGH (ref 11.0–14.6)
WBC: 1.6 10*3/uL — AB (ref 4.0–10.3)
lymph#: 0.6 10*3/uL — ABNORMAL LOW (ref 0.9–3.3)
nRBC: 2 % — ABNORMAL HIGH (ref 0–0)

## 2017-08-25 LAB — TECHNOLOGIST REVIEW

## 2017-08-25 MED ORDER — DARBEPOETIN ALFA 300 MCG/0.6ML IJ SOSY
300.0000 ug | PREFILLED_SYRINGE | Freq: Once | INTRAMUSCULAR | Status: AC
  Administered 2017-08-25: 300 ug via SUBCUTANEOUS
  Filled 2017-08-25: qty 0.6

## 2017-08-25 NOTE — Patient Instructions (Signed)

## 2017-09-15 ENCOUNTER — Other Ambulatory Visit: Payer: Self-pay | Admitting: *Deleted

## 2017-09-15 ENCOUNTER — Other Ambulatory Visit (HOSPITAL_BASED_OUTPATIENT_CLINIC_OR_DEPARTMENT_OTHER): Payer: Medicare Other

## 2017-09-15 ENCOUNTER — Ambulatory Visit (HOSPITAL_BASED_OUTPATIENT_CLINIC_OR_DEPARTMENT_OTHER): Payer: Medicare Other

## 2017-09-15 VITALS — BP 118/61 | HR 84 | Temp 97.7°F | Resp 22

## 2017-09-15 DIAGNOSIS — D469 Myelodysplastic syndrome, unspecified: Secondary | ICD-10-CM | POA: Diagnosis present

## 2017-09-15 DIAGNOSIS — D539 Nutritional anemia, unspecified: Secondary | ICD-10-CM

## 2017-09-15 LAB — CBC WITH DIFFERENTIAL/PLATELET
BASO%: 0.4 % (ref 0.0–2.0)
Basophils Absolute: 0 10*3/uL (ref 0.0–0.1)
EOS%: 0.8 % (ref 0.0–7.0)
Eosinophils Absolute: 0 10*3/uL (ref 0.0–0.5)
HCT: 29.3 % — ABNORMAL LOW (ref 38.4–49.9)
HEMOGLOBIN: 9.2 g/dL — AB (ref 13.0–17.1)
LYMPH#: 0.8 10*3/uL — AB (ref 0.9–3.3)
LYMPH%: 31.1 % (ref 14.0–49.0)
MCH: 32.2 pg (ref 27.2–33.4)
MCHC: 31.4 g/dL — ABNORMAL LOW (ref 32.0–36.0)
MCV: 102.4 fL — AB (ref 79.3–98.0)
MONO#: 0.9 10*3/uL (ref 0.1–0.9)
MONO%: 36.2 % — ABNORMAL HIGH (ref 0.0–14.0)
NEUT#: 0.8 10*3/uL — ABNORMAL LOW (ref 1.5–6.5)
NEUT%: 31.5 % — AB (ref 39.0–75.0)
NRBC: 2 % — AB (ref 0–0)
PLATELETS: 121 10*3/uL — AB (ref 140–400)
RBC: 2.86 10*6/uL — ABNORMAL LOW (ref 4.20–5.82)
RDW: 23 % — ABNORMAL HIGH (ref 11.0–14.6)
WBC: 2.6 10*3/uL — ABNORMAL LOW (ref 4.0–10.3)

## 2017-09-15 LAB — COMPREHENSIVE METABOLIC PANEL
ALT: 10 U/L (ref 0–55)
AST: 11 U/L (ref 5–34)
Albumin: 3.8 g/dL (ref 3.5–5.0)
Alkaline Phosphatase: 46 U/L (ref 40–150)
Anion Gap: 7 mEq/L (ref 3–11)
BILIRUBIN TOTAL: 0.95 mg/dL (ref 0.20–1.20)
BUN: 13.9 mg/dL (ref 7.0–26.0)
CHLORIDE: 105 meq/L (ref 98–109)
CO2: 26 meq/L (ref 22–29)
Calcium: 8.7 mg/dL (ref 8.4–10.4)
Creatinine: 0.8 mg/dL (ref 0.7–1.3)
GLUCOSE: 90 mg/dL (ref 70–140)
Potassium: 3.7 mEq/L (ref 3.5–5.1)
SODIUM: 138 meq/L (ref 136–145)
TOTAL PROTEIN: 6.9 g/dL (ref 6.4–8.3)

## 2017-09-15 LAB — TECHNOLOGIST REVIEW

## 2017-09-15 MED ORDER — DARBEPOETIN ALFA 300 MCG/0.6ML IJ SOSY
300.0000 ug | PREFILLED_SYRINGE | Freq: Once | INTRAMUSCULAR | Status: AC
  Administered 2017-09-15: 300 ug via SUBCUTANEOUS

## 2017-09-15 NOTE — Patient Instructions (Signed)

## 2017-09-29 ENCOUNTER — Ambulatory Visit (HOSPITAL_BASED_OUTPATIENT_CLINIC_OR_DEPARTMENT_OTHER): Payer: Medicare Other

## 2017-09-29 ENCOUNTER — Other Ambulatory Visit: Payer: Self-pay | Admitting: Medical Oncology

## 2017-09-29 ENCOUNTER — Telehealth: Payer: Self-pay | Admitting: Medical Oncology

## 2017-09-29 DIAGNOSIS — D469 Myelodysplastic syndrome, unspecified: Secondary | ICD-10-CM

## 2017-09-29 LAB — CBC WITH DIFFERENTIAL/PLATELET
BASO%: 0.6 % (ref 0.0–2.0)
Basophils Absolute: 0 10*3/uL (ref 0.0–0.1)
EOS ABS: 0 10*3/uL (ref 0.0–0.5)
EOS%: 0.6 % (ref 0.0–7.0)
HEMATOCRIT: 29.4 % — AB (ref 38.4–49.9)
HEMOGLOBIN: 9.1 g/dL — AB (ref 13.0–17.1)
LYMPH#: 0.5 10*3/uL — AB (ref 0.9–3.3)
LYMPH%: 16.9 % (ref 14.0–49.0)
MCH: 31.9 pg (ref 27.2–33.4)
MCHC: 31 g/dL — ABNORMAL LOW (ref 32.0–36.0)
MCV: 103.2 fL — ABNORMAL HIGH (ref 79.3–98.0)
MONO#: 1.1 10*3/uL — ABNORMAL HIGH (ref 0.1–0.9)
MONO%: 35.4 % — ABNORMAL HIGH (ref 0.0–14.0)
NEUT#: 1.5 10*3/uL (ref 1.5–6.5)
NEUT%: 46.5 % (ref 39.0–75.0)
NRBC: 2 % — AB (ref 0–0)
PLATELETS: 114 10*3/uL — AB (ref 140–400)
RBC: 2.85 10*6/uL — ABNORMAL LOW (ref 4.20–5.82)
RDW: 24 % — AB (ref 11.0–14.6)
WBC: 3.1 10*3/uL — ABNORMAL LOW (ref 4.0–10.3)

## 2017-09-29 LAB — COMPREHENSIVE METABOLIC PANEL
ALT: 12 U/L (ref 0–55)
ANION GAP: 6 meq/L (ref 3–11)
AST: 13 U/L (ref 5–34)
Albumin: 3.8 g/dL (ref 3.5–5.0)
Alkaline Phosphatase: 41 U/L (ref 40–150)
BUN: 14.8 mg/dL (ref 7.0–26.0)
CHLORIDE: 102 meq/L (ref 98–109)
CO2: 26 meq/L (ref 22–29)
Calcium: 8.8 mg/dL (ref 8.4–10.4)
Creatinine: 0.8 mg/dL (ref 0.7–1.3)
Glucose: 105 mg/dl (ref 70–140)
Potassium: 4.1 mEq/L (ref 3.5–5.1)
SODIUM: 134 meq/L — AB (ref 136–145)
Total Bilirubin: 1.37 mg/dL — ABNORMAL HIGH (ref 0.20–1.20)
Total Protein: 7 g/dL (ref 6.4–8.3)

## 2017-09-29 LAB — TECHNOLOGIST REVIEW

## 2017-09-29 NOTE — Telephone Encounter (Signed)
Wife called to report pt has low grade fever,  dizziness and nausea off and on for 1 week.  Being treated for skin infection on leg for two weeks and on antibiotic for the same, Prednisone for shoulder pain. She called PCP who scheduled app for today :Pt coming here for labs prior.

## 2017-10-06 ENCOUNTER — Inpatient Hospital Stay: Payer: Medicare Other

## 2017-10-06 ENCOUNTER — Inpatient Hospital Stay: Payer: Medicare Other | Attending: Internal Medicine

## 2017-10-06 VITALS — BP 108/59 | HR 76 | Temp 97.6°F | Resp 20

## 2017-10-06 DIAGNOSIS — D469 Myelodysplastic syndrome, unspecified: Secondary | ICD-10-CM | POA: Insufficient documentation

## 2017-10-06 DIAGNOSIS — D539 Nutritional anemia, unspecified: Secondary | ICD-10-CM

## 2017-10-06 LAB — CBC WITH DIFFERENTIAL/PLATELET
Abs Granulocyte: 0.3 10*3/uL — CL (ref 1.5–6.5)
Basophils Absolute: 0 10*3/uL (ref 0.0–0.1)
Basophils Relative: 3 %
Eosinophils Absolute: 0.1 10*3/uL (ref 0.0–0.5)
Eosinophils Relative: 4 %
HEMATOCRIT: 28.3 % — AB (ref 38.4–49.9)
Hemoglobin: 8.8 g/dL — ABNORMAL LOW (ref 13.0–17.1)
LYMPHS ABS: 0.7 10*3/uL — AB (ref 0.9–3.3)
LYMPHS PCT: 42 %
MCH: 31.8 pg (ref 27.2–33.4)
MCHC: 31.1 g/dL — ABNORMAL LOW (ref 32.0–36.0)
MCV: 102.2 fL — ABNORMAL HIGH (ref 79.3–98.0)
MONOS PCT: 31 %
Monocytes Absolute: 0.5 10*3/uL (ref 0.1–0.9)
NEUTROS ABS: 0.3 10*3/uL — AB (ref 1.5–6.5)
NEUTROS PCT: 20 %
PLATELETS: 183 10*3/uL (ref 140–400)
RBC: 2.77 MIL/uL — ABNORMAL LOW (ref 4.20–5.82)
RDW: 23.6 % — ABNORMAL HIGH (ref 11.0–15.6)
WBC: 1.6 10*3/uL — ABNORMAL LOW (ref 4.0–10.3)

## 2017-10-06 LAB — COMPREHENSIVE METABOLIC PANEL
ALBUMIN: 3.6 g/dL (ref 3.5–5.0)
ALK PHOS: 46 U/L (ref 40–150)
ALT: 11 U/L (ref 0–55)
ANION GAP: 7 (ref 3–11)
AST: 12 U/L (ref 5–34)
BUN: 16 mg/dL (ref 7–26)
CALCIUM: 8.8 mg/dL (ref 8.4–10.4)
CHLORIDE: 105 mmol/L (ref 98–109)
CO2: 26 mmol/L (ref 22–29)
CREATININE: 0.82 mg/dL (ref 0.70–1.30)
GFR calc non Af Amer: >60 mL/min (ref >60)
GLUCOSE: 85 mg/dL (ref 70–140)
Potassium: 4.2 mmol/L (ref 3.5–5.1)
SODIUM: 138 mmol/L (ref 136–145)
Total Bilirubin: 0.9 mg/dL (ref 0.2–1.2)
Total Protein: 7 g/dL (ref 6.4–8.3)

## 2017-10-06 LAB — SAVE SMEAR: Smear Review: 2

## 2017-10-06 MED ORDER — DARBEPOETIN ALFA 300 MCG/0.6ML IJ SOSY
300.0000 ug | PREFILLED_SYRINGE | Freq: Once | INTRAMUSCULAR | Status: AC
  Administered 2017-10-06: 300 ug via SUBCUTANEOUS

## 2017-10-06 NOTE — Patient Instructions (Signed)

## 2017-10-06 NOTE — Progress Notes (Signed)
Pt stated he felt weak after blood draw. Placed in wheelchair by lab staff. States he feels better at 0955, VSS, skin warm dry. Pt advised not to drive home due to safety reasons. Pt is insistent that he feels well enough to drive home. Pt is alert, oriented to place, time, situation, self.  Pt sat in lobby for approx 30 minutes, stated he was fine, skin warm, dry, oriented to time , place, situation, person. Stated he was well enough to drive home.

## 2017-10-06 NOTE — Progress Notes (Signed)
Mailed AVS to wife.

## 2017-10-11 ENCOUNTER — Telehealth: Payer: Self-pay | Admitting: Medical Oncology

## 2017-10-11 NOTE — Telephone Encounter (Signed)
Labs mailed to pt

## 2017-10-27 ENCOUNTER — Inpatient Hospital Stay: Payer: Medicare Other

## 2017-10-27 ENCOUNTER — Other Ambulatory Visit: Payer: Self-pay | Admitting: Medical Oncology

## 2017-10-27 ENCOUNTER — Telehealth: Payer: Self-pay | Admitting: Medical Oncology

## 2017-10-27 VITALS — BP 112/59 | HR 83 | Temp 97.8°F | Resp 18

## 2017-10-27 DIAGNOSIS — D469 Myelodysplastic syndrome, unspecified: Secondary | ICD-10-CM

## 2017-10-27 DIAGNOSIS — D539 Nutritional anemia, unspecified: Secondary | ICD-10-CM

## 2017-10-27 DIAGNOSIS — D649 Anemia, unspecified: Secondary | ICD-10-CM

## 2017-10-27 LAB — CBC WITH DIFFERENTIAL/PLATELET
BASOS ABS: 0 10*3/uL (ref 0.0–0.1)
BASOS PCT: 2 %
EOS ABS: 0.1 10*3/uL (ref 0.0–0.5)
Eosinophils Relative: 5 %
HCT: 26.5 % — ABNORMAL LOW (ref 38.4–49.9)
HEMOGLOBIN: 8.3 g/dL — AB (ref 13.0–17.1)
Lymphocytes Relative: 43 %
Lymphs Abs: 0.6 10*3/uL — ABNORMAL LOW (ref 0.9–3.3)
MCH: 32.3 pg (ref 27.2–33.4)
MCHC: 31.3 g/dL — AB (ref 32.0–36.0)
MCV: 103.1 fL — ABNORMAL HIGH (ref 79.3–98.0)
Monocytes Absolute: 0.4 10*3/uL (ref 0.1–0.9)
Monocytes Relative: 29 %
NEUTROS PCT: 21 %
Neutro Abs: 0.3 10*3/uL — CL (ref 1.5–6.5)
Platelets: 132 10*3/uL — ABNORMAL LOW (ref 140–400)
RBC: 2.57 MIL/uL — ABNORMAL LOW (ref 4.20–5.82)
RDW: 24 % — ABNORMAL HIGH (ref 11.0–14.6)
WBC: 1.3 10*3/uL — AB (ref 4.0–10.3)

## 2017-10-27 LAB — ABO/RH: ABO/RH(D): A POS

## 2017-10-27 LAB — PREPARE RBC (CROSSMATCH)

## 2017-10-27 LAB — COMPREHENSIVE METABOLIC PANEL
ALK PHOS: 54 U/L (ref 40–150)
ALT: 7 U/L (ref 0–55)
ANION GAP: 7 (ref 3–11)
AST: 13 U/L (ref 5–34)
Albumin: 3.6 g/dL (ref 3.5–5.0)
BILIRUBIN TOTAL: 1.1 mg/dL (ref 0.2–1.2)
BUN: 16 mg/dL (ref 7–26)
CALCIUM: 8.9 mg/dL (ref 8.4–10.4)
CO2: 26 mmol/L (ref 22–29)
Chloride: 104 mmol/L (ref 98–109)
Creatinine, Ser: 0.9 mg/dL (ref 0.70–1.30)
GFR calc Af Amer: >60 mL/min (ref >60)
GFR calc non Af Amer: >60 mL/min (ref >60)
Glucose, Bld: 75 mg/dL (ref 70–140)
Potassium: 3.8 mmol/L (ref 3.5–5.1)
Sodium: 137 mmol/L (ref 136–145)
TOTAL PROTEIN: 6.9 g/dL (ref 6.4–8.3)

## 2017-10-27 LAB — LACTATE DEHYDROGENASE: LDH: 235 U/L (ref 125–245)

## 2017-10-27 MED ORDER — DIPHENHYDRAMINE HCL 25 MG PO CAPS
25.0000 mg | ORAL_CAPSULE | Freq: Once | ORAL | Status: AC
  Administered 2017-10-27: 25 mg via ORAL

## 2017-10-27 MED ORDER — SODIUM CHLORIDE 0.9 % IV SOLN
250.0000 mL | Freq: Once | INTRAVENOUS | Status: AC
  Administered 2017-10-27: 250 mL via INTRAVENOUS

## 2017-10-27 MED ORDER — DIPHENHYDRAMINE HCL 25 MG PO CAPS
ORAL_CAPSULE | ORAL | Status: AC
  Filled 2017-10-27: qty 1

## 2017-10-27 MED ORDER — ACETAMINOPHEN 325 MG PO TABS
ORAL_TABLET | ORAL | Status: AC
Start: 2017-10-27 — End: 2017-10-27
  Filled 2017-10-27: qty 2

## 2017-10-27 MED ORDER — ACETAMINOPHEN 325 MG PO TABS
650.0000 mg | ORAL_TABLET | Freq: Once | ORAL | Status: AC
  Administered 2017-10-27: 650 mg via ORAL

## 2017-10-27 MED ORDER — DARBEPOETIN ALFA 300 MCG/0.6ML IJ SOSY
300.0000 ug | PREFILLED_SYRINGE | Freq: Once | INTRAMUSCULAR | Status: AC
  Administered 2017-10-27: 300 ug via SUBCUTANEOUS

## 2017-10-27 NOTE — Patient Instructions (Signed)

## 2017-10-27 NOTE — Telephone Encounter (Signed)
Injection room nurse reported hgb 8.3, pt feels weak. I saw pt in lobby he looks very weak. Per Mohamed 1 unit of blood ordered.

## 2017-10-27 NOTE — Progress Notes (Signed)
Pt c/o being "run down", no energy, difficulty breathing upon ambulation. Hemoglobin noted at 8.3. Spoke with Abelina Bachelor, Rn for [facility]. Plan to transfuse blood today.

## 2017-10-27 NOTE — Progress Notes (Signed)
Blood ordered entered

## 2017-10-27 NOTE — Telephone Encounter (Signed)
Wife notified.

## 2017-10-27 NOTE — Patient Instructions (Signed)

## 2017-10-28 LAB — TYPE AND SCREEN
ABO/RH(D): A POS
ANTIBODY SCREEN: NEGATIVE
UNIT DIVISION: 0

## 2017-10-28 LAB — BPAM RBC
BLOOD PRODUCT EXPIRATION DATE: 201902142359
ISSUE DATE / TIME: 201901301206
UNIT TYPE AND RH: 6200

## 2017-11-17 ENCOUNTER — Inpatient Hospital Stay: Payer: Medicare Other | Attending: Internal Medicine

## 2017-11-17 ENCOUNTER — Inpatient Hospital Stay (HOSPITAL_BASED_OUTPATIENT_CLINIC_OR_DEPARTMENT_OTHER): Payer: Medicare Other | Admitting: Internal Medicine

## 2017-11-17 ENCOUNTER — Inpatient Hospital Stay: Payer: Medicare Other

## 2017-11-17 ENCOUNTER — Other Ambulatory Visit: Payer: Self-pay | Admitting: Medical Oncology

## 2017-11-17 ENCOUNTER — Encounter: Payer: Self-pay | Admitting: Internal Medicine

## 2017-11-17 VITALS — BP 92/68 | HR 81 | Temp 95.8°F | Resp 16 | Wt 158.2 lb

## 2017-11-17 DIAGNOSIS — D649 Anemia, unspecified: Secondary | ICD-10-CM

## 2017-11-17 DIAGNOSIS — Z66 Do not resuscitate: Secondary | ICD-10-CM | POA: Diagnosis not present

## 2017-11-17 DIAGNOSIS — D469 Myelodysplastic syndrome, unspecified: Secondary | ICD-10-CM

## 2017-11-17 DIAGNOSIS — Z862 Personal history of diseases of the blood and blood-forming organs and certain disorders involving the immune mechanism: Secondary | ICD-10-CM

## 2017-11-17 DIAGNOSIS — R5383 Other fatigue: Secondary | ICD-10-CM

## 2017-11-17 DIAGNOSIS — D709 Neutropenia, unspecified: Secondary | ICD-10-CM | POA: Insufficient documentation

## 2017-11-17 DIAGNOSIS — R531 Weakness: Secondary | ICD-10-CM | POA: Insufficient documentation

## 2017-11-17 DIAGNOSIS — D539 Nutritional anemia, unspecified: Secondary | ICD-10-CM

## 2017-11-17 DIAGNOSIS — D509 Iron deficiency anemia, unspecified: Secondary | ICD-10-CM

## 2017-11-17 LAB — CBC WITH DIFFERENTIAL (CANCER CENTER ONLY)
BASOS PCT: 2 %
Basophils Absolute: 0 10*3/uL (ref 0.0–0.1)
Eosinophils Absolute: 0.1 10*3/uL (ref 0.0–0.5)
Eosinophils Relative: 3 %
HCT: 25.6 % — ABNORMAL LOW (ref 38.4–49.9)
Hemoglobin: 8 g/dL — ABNORMAL LOW (ref 13.0–17.1)
LYMPHS ABS: 0.7 10*3/uL — AB (ref 0.9–3.3)
LYMPHS PCT: 41 %
MCH: 32.4 pg (ref 27.2–33.4)
MCHC: 31.3 g/dL — ABNORMAL LOW (ref 32.0–36.0)
MCV: 103.6 fL — AB (ref 79.3–98.0)
MONO ABS: 0.4 10*3/uL (ref 0.1–0.9)
MONOS PCT: 21 %
NEUTROS PCT: 33 %
NRBC: 3 /100{WBCs} — AB
Neutro Abs: 0.6 10*3/uL — ABNORMAL LOW (ref 1.5–6.5)
PLATELETS: 171 10*3/uL (ref 140–400)
RBC: 2.47 MIL/uL — ABNORMAL LOW (ref 4.20–5.82)
RDW: 23.7 % — ABNORMAL HIGH (ref 11.0–14.6)
WBC Count: 1.8 10*3/uL — ABNORMAL LOW (ref 4.0–10.3)

## 2017-11-17 LAB — CMP (CANCER CENTER ONLY)
ALBUMIN: 3.4 g/dL — AB (ref 3.5–5.0)
ALT: 8 U/L (ref 0–55)
ANION GAP: 7 (ref 3–11)
AST: 12 U/L (ref 5–34)
Alkaline Phosphatase: 41 U/L (ref 40–150)
BUN: 13 mg/dL (ref 7–26)
CALCIUM: 8.7 mg/dL (ref 8.4–10.4)
CO2: 25 mmol/L (ref 22–29)
Chloride: 104 mmol/L (ref 98–109)
Creatinine: 0.84 mg/dL (ref 0.70–1.30)
GFR, Estimated: >60 mL/min (ref >60)
GLUCOSE: 82 mg/dL (ref 70–140)
Potassium: 3.9 mmol/L (ref 3.5–5.1)
SODIUM: 136 mmol/L (ref 136–145)
Total Bilirubin: 0.9 mg/dL (ref 0.2–1.2)
Total Protein: 7.1 g/dL (ref 6.4–8.3)

## 2017-11-17 LAB — PREPARE RBC (CROSSMATCH)

## 2017-11-17 MED ORDER — DARBEPOETIN ALFA 300 MCG/0.6ML IJ SOSY
300.0000 ug | PREFILLED_SYRINGE | Freq: Once | INTRAMUSCULAR | Status: AC
  Administered 2017-11-17: 300 ug via SUBCUTANEOUS

## 2017-11-17 MED ORDER — SODIUM CHLORIDE 0.9 % IV SOLN: 250.0000 mL | Freq: Once | INTRAVENOUS | Status: DC

## 2017-11-17 MED ORDER — ACETAMINOPHEN 325 MG PO TABS
ORAL_TABLET | ORAL | Status: AC
Start: 2017-11-17 — End: 2017-11-17
  Filled 2017-11-17: qty 2

## 2017-11-17 MED ORDER — DARBEPOETIN ALFA 300 MCG/0.6ML IJ SOSY
PREFILLED_SYRINGE | INTRAMUSCULAR | Status: AC
  Filled 2017-11-17: qty 0.6

## 2017-11-17 MED ORDER — ACETAMINOPHEN 325 MG PO TABS
ORAL_TABLET | ORAL | Status: AC
  Filled 2017-11-17: qty 2

## 2017-11-17 MED ORDER — DIPHENHYDRAMINE HCL 25 MG PO CAPS
25.0000 mg | ORAL_CAPSULE | Freq: Once | ORAL | Status: AC
  Administered 2017-11-17: 25 mg via ORAL

## 2017-11-17 MED ORDER — DIPHENHYDRAMINE HCL 25 MG PO CAPS
ORAL_CAPSULE | ORAL | Status: AC
Start: 2017-11-17 — End: 2017-11-17
  Filled 2017-11-17: qty 1

## 2017-11-17 MED ORDER — SODIUM CHLORIDE 0.9 % IV SOLN
250.0000 mL | Freq: Once | INTRAVENOUS | Status: AC
  Administered 2017-11-17: 250 mL via INTRAVENOUS

## 2017-11-17 MED ORDER — ACETAMINOPHEN 325 MG PO TABS
650.0000 mg | ORAL_TABLET | Freq: Once | ORAL | Status: AC
  Administered 2017-11-17: 650 mg via ORAL

## 2017-11-17 NOTE — Patient Instructions (Signed)

## 2017-11-17 NOTE — Progress Notes (Signed)
[facility] [phone]   Fax:(336) [phone]  OFFICE PROGRESS NOTE  Lorene Dy, MD [address] [place] [address] [place] [address]  DIAGNOSIS: 1)  myelodysplastic syndrome consistent with refractory anemia with excess blast diagnosed in December 2016 2)  History of ITP  PRIOR THERAPY: Vidaza 75 mg/m2 IV for 5 days every 4 weeks. Status post 3 cycles.  CURRENT THERAPY:  1) Supportive care with Aranesp 300 g subcutaneously every 3 weeks in addition to Granix 300 mcg subcutaneously for neutropenia as needed.  INTERVAL HISTORY: Dean Stanley 84 y.o. male returns to the clinic today for follow-up visit.  The patient continues to complain of increasing fatigue and weakness.  He denied having any fever but continues to have chills and feeling cold all the time.  He denied having any recent weight loss or night sweats.  He has no nausea, vomiting, diarrhea or constipation.  He denied having any chest pain, shortness breath, cough or hemoptysis.  He has been on treatment with Aranesp every 3 weeks and tolerating it fairly well.  He is here today for evaluation and repeat blood work.  MEDICAL HISTORY: Past Medical History:  Diagnosis Date  . Anemia   . Cancer (HCC)    myelodysplastic syndrome  . Complication of anesthesia    Reaction to anesthesia " things went haywire around me"  . Do not resuscitate 11/04/2015  . Hearing impairment    wears hearing left ear  . History of ITP 1960  . Hypothyroidism   . Meniere's syndrome 1980s  . Port catheter in place 12/23/2015    ALLERGIES:  is allergic to percocet [oxycodone-acetaminophen]; hydrocodone; and oxycodone.  MEDICATIONS:  Current Outpatient Medications  Medication Sig Dispense Refill  . Darbepoetin Alfa (ARANESP) 300 MCG/0.6ML SOSY injection Inject 300 mcg into the skin every 30 (thirty) days.    Marland Kitchen desmopressin (DDAVP) 0.2 MG tablet Take 0.2 mg by mouth daily.    Marland Kitchen docusate calcium (KAO-TIN) 240  MG capsule Take 240 mg by mouth daily.    . ferrous sulfate 325 (65 FE) MG tablet Take 325 mg by mouth daily with breakfast.    . finasteride (PROPECIA) 1 MG tablet Take 1 mg by mouth daily with breakfast.     . levothyroxine (SYNTHROID, LEVOTHROID) 200 MCG tablet Take 200 mcg by mouth at bedtime.     . Lutein 40 MG CAPS Take 40 mg by mouth daily with breakfast.     . Multiple Vitamins-Minerals (CENTRUM ADULTS PO) Take 1 tablet by mouth daily.     . Tbo-Filgrastim (GRANIX) 300 MCG/0.5ML SOSY injection Inject 300 mcg into the skin every 30 (thirty) days.    Marland Kitchen triamcinolone (KENALOG) 0.025 % cream Apply 1 application topically 2 (two) times daily as needed (for inflammation).      No current facility-administered medications for this visit.    Facility-Administered Medications Ordered in Other Visits  Medication Dose Route Frequency Provider Last Rate Last Dose  . azaCITIDine (VIDAZA) 150 mg in lactated ringers 45 mL chemo infusion  75 mg/m2 (Treatment Plan Recorded) Intravenous Daily Curt Bears, MD   150 mg at 12/02/15 1428  . Darbepoetin Alfa (ARANESP) injection 300 mcg  300 mcg Subcutaneous Once Curt Bears, MD      . heparin lock flush 100 unit/mL  500 Units Intracatheter Once PRN Curt Bears, MD      . sodium chloride 0.9 % injection 10 mL  10 mL Intracatheter PRN Curt Bears, MD  SURGICAL HISTORY:  Past Surgical History:  Procedure Laterality Date  . cataract Right 2011  . CIRCUMCISION    . COLONOSCOPY    . cortisone injections to back x 6    . ORIF ORBITAL FRACTURE Left 2000   2 fractures orbital platform    REVIEW OF SYSTEMS:  A comprehensive review of systems was negative except for: Constitutional: positive for fatigue   PHYSICAL EXAMINATION: General appearance: alert, cooperative, fatigued and no distress Head: Normocephalic, without obvious abnormality, atraumatic Neck: no adenopathy, no JVD, supple, symmetrical, trachea midline and thyroid not  enlarged, symmetric, no tenderness/mass/nodules Lymph nodes: Cervical, supraclavicular, and axillary nodes normal. Resp: clear to auscultation bilaterally Back: symmetric, no curvature. ROM normal. No CVA tenderness. Cardio: regular rate and rhythm, S1, S2 normal, no murmur, click, rub or gallop GI: soft, non-tender; bowel sounds normal; no masses,  no organomegaly Extremities: extremities normal, atraumatic, no cyanosis or edema  ECOG PERFORMANCE STATUS: 1 - Symptomatic but completely ambulatory  Blood pressure 92/68, pulse 81, temperature (!) 95.8 F (35.4 C), temperature source Oral, resp. rate 16, weight 158 lb 3.2 oz (71.8 kg), SpO2 96 %.  LABORATORY DATA: Lab Results  Component Value Date   WBC 1.8 (L) 11/17/2017   HGB 8.3 (L) 10/27/2017   HCT 25.6 (L) 11/17/2017   MCV 103.6 (H) 11/17/2017   PLT 171 11/17/2017      Chemistry      Component Value Date/Time   NA 137 10/27/2017 0841   NA 134 (L) 09/29/2017 1134   K 3.8 10/27/2017 0841   K 4.1 09/29/2017 1134   CL 104 10/27/2017 0841   CO2 26 10/27/2017 0841   CO2 26 09/29/2017 1134   BUN 16 10/27/2017 0841   BUN 14.8 09/29/2017 1134   CREATININE 0.90 10/27/2017 0841   CREATININE 0.8 09/29/2017 1134      Component Value Date/Time   CALCIUM 8.9 10/27/2017 0841   CALCIUM 8.8 09/29/2017 1134   ALKPHOS 54 10/27/2017 0841   ALKPHOS 41 09/29/2017 1134   AST 13 10/27/2017 0841   AST 13 09/29/2017 1134   ALT 7 10/27/2017 0841   ALT 12 09/29/2017 1134   BILITOT 1.1 10/27/2017 0841   BILITOT 1.37 (H) 09/29/2017 1134       RADIOGRAPHIC STUDIES: No results found.  ASSESSMENT AND PLAN:   This is a very pleasant 84 years old white male with myelodysplastic syndrome consistent with refractory anemia with excess blasts status post systemic chemotherapy with Vidaza 3 cycles and has been observation since that time. The patient continues to complain of fatigue and weakness. CBC today showed low hemoglobin of 8.0.  He  continues to have leukocytopenia and neutropenia but platelets count are within the normal range. For the anemia, I will arrange for the patient to receive 2 units of PRBCs transfusion today.  He will also continue on treatment with Aranesp every 3 weeks in addition to oral iron tablets. I will see him back for follow-up visit in 3 months for evaluation with repeat CBC, iron study and ferritin. He was advised to call immediately if he has any concerning symptoms in the interval. The patient voices understanding of current disease status and treatment options and is in agreement with the current care plan. All questions were answered. The patient knows to call the clinic with any problems, questions or concerns. We can certainly see the patient much sooner if necessary. I spent 10 minutes counseling the patient face to face. The total time spent  in the appointment was 15 minutes.  Disclaimer: This note was dictated with voice recognition software. Similar sounding words can inadvertently be transcribed and may not be corrected upon review.

## 2017-11-17 NOTE — Patient Instructions (Signed)

## 2017-11-18 LAB — TYPE AND SCREEN
ABO/RH(D): A POS
ANTIBODY SCREEN: NEGATIVE
UNIT DIVISION: 0
UNIT DIVISION: 0

## 2017-11-18 LAB — BPAM RBC
BLOOD PRODUCT EXPIRATION DATE: 201903062359
BLOOD PRODUCT EXPIRATION DATE: 201903072359
ISSUE DATE / TIME: 201902201139
ISSUE DATE / TIME: 201902201139
UNIT TYPE AND RH: 6200
Unit Type and Rh: 6200

## 2017-12-05 ENCOUNTER — Inpatient Hospital Stay (HOSPITAL_COMMUNITY)
Admission: EM | Admit: 2017-12-05 | Discharge: 2017-12-09 | DRG: 603 | Disposition: A | Discharge status: Home / Self Care | Payer: Medicare Other | Attending: Internal Medicine | Admitting: Internal Medicine

## 2017-12-05 ENCOUNTER — Other Ambulatory Visit: Payer: Self-pay

## 2017-12-05 ENCOUNTER — Encounter (HOSPITAL_COMMUNITY): Payer: Self-pay

## 2017-12-05 DIAGNOSIS — L0390 Cellulitis, unspecified: Secondary | ICD-10-CM | POA: Diagnosis present

## 2017-12-05 DIAGNOSIS — I959 Hypotension, unspecified: Secondary | ICD-10-CM | POA: Diagnosis present

## 2017-12-05 DIAGNOSIS — E039 Hypothyroidism, unspecified: Secondary | ICD-10-CM | POA: Diagnosis present

## 2017-12-05 DIAGNOSIS — E611 Iron deficiency: Secondary | ICD-10-CM | POA: Diagnosis present

## 2017-12-05 DIAGNOSIS — D899 Disorder involving the immune mechanism, unspecified: Secondary | ICD-10-CM | POA: Diagnosis present

## 2017-12-05 DIAGNOSIS — D469 Myelodysplastic syndrome, unspecified: Secondary | ICD-10-CM | POA: Diagnosis present

## 2017-12-05 DIAGNOSIS — L03115 Cellulitis of right lower limb: Secondary | ICD-10-CM | POA: Diagnosis not present

## 2017-12-05 DIAGNOSIS — Z885 Allergy status to narcotic agent status: Secondary | ICD-10-CM

## 2017-12-05 DIAGNOSIS — Z79899 Other long term (current) drug therapy: Secondary | ICD-10-CM

## 2017-12-05 DIAGNOSIS — D708 Other neutropenia: Secondary | ICD-10-CM | POA: Diagnosis not present

## 2017-12-05 DIAGNOSIS — Z7984 Long term (current) use of oral hypoglycemic drugs: Secondary | ICD-10-CM

## 2017-12-05 DIAGNOSIS — D649 Anemia, unspecified: Secondary | ICD-10-CM | POA: Diagnosis present

## 2017-12-05 DIAGNOSIS — Z66 Do not resuscitate: Secondary | ICD-10-CM | POA: Diagnosis present

## 2017-12-05 DIAGNOSIS — Z9221 Personal history of antineoplastic chemotherapy: Secondary | ICD-10-CM

## 2017-12-05 DIAGNOSIS — D61818 Other pancytopenia: Secondary | ICD-10-CM | POA: Diagnosis present

## 2017-12-05 DIAGNOSIS — Z974 Presence of external hearing-aid: Secondary | ICD-10-CM

## 2017-12-05 DIAGNOSIS — H9190 Unspecified hearing loss, unspecified ear: Secondary | ICD-10-CM | POA: Diagnosis present

## 2017-12-05 DIAGNOSIS — D709 Neutropenia, unspecified: Secondary | ICD-10-CM | POA: Diagnosis present

## 2017-12-05 LAB — CBC WITH DIFFERENTIAL/PLATELET
BASOS PCT: 1 %
Basophils Absolute: 0 10*3/uL (ref 0.0–0.1)
EOS PCT: 0 %
Eosinophils Absolute: 0 10*3/uL (ref 0.0–0.7)
HEMATOCRIT: 27.7 % — AB (ref 39.0–52.0)
Hemoglobin: 8.9 g/dL — ABNORMAL LOW (ref 13.0–17.0)
LYMPHS ABS: 1.1 10*3/uL (ref 0.7–4.0)
Lymphocytes Relative: 43 %
MCH: 33 pg (ref 26.0–34.0)
MCHC: 32.1 g/dL (ref 30.0–36.0)
MCV: 102.6 fL — ABNORMAL HIGH (ref 78.0–100.0)
MONO ABS: 0.4 10*3/uL (ref 0.1–1.0)
Monocytes Relative: 15 %
Neutro Abs: 1.1 10*3/uL — ABNORMAL LOW (ref 1.7–7.7)
Neutrophils Relative %: 41 %
Platelets: 131 10*3/uL — ABNORMAL LOW (ref 150–400)
RBC: 2.7 MIL/uL — ABNORMAL LOW (ref 4.22–5.81)
RDW: 22.3 % — AB (ref 11.5–15.5)
WBC: 2.6 10*3/uL — AB (ref 4.0–10.5)

## 2017-12-05 LAB — COMPREHENSIVE METABOLIC PANEL
ALT: 12 U/L — ABNORMAL LOW (ref 17–63)
ANION GAP: 7 (ref 5–15)
AST: 21 U/L (ref 15–41)
Albumin: 3.8 g/dL (ref 3.5–5.0)
Alkaline Phosphatase: 41 U/L (ref 38–126)
BILIRUBIN TOTAL: 0.7 mg/dL (ref 0.3–1.2)
BUN: 12 mg/dL (ref 6–20)
CO2: 25 mmol/L (ref 22–32)
Calcium: 8.8 mg/dL — ABNORMAL LOW (ref 8.9–10.3)
Chloride: 105 mmol/L (ref 101–111)
Creatinine, Ser: 0.81 mg/dL (ref 0.61–1.24)
GFR calc Af Amer: >60 mL/min (ref >60)
Glucose, Bld: 89 mg/dL (ref 65–99)
POTASSIUM: 4.1 mmol/L (ref 3.5–5.1)
Sodium: 137 mmol/L (ref 135–145)
TOTAL PROTEIN: 7.4 g/dL (ref 6.5–8.1)

## 2017-12-05 LAB — MRSA PCR SCREENING: MRSA by PCR: NEGATIVE

## 2017-12-05 MED ORDER — SODIUM CHLORIDE 0.9 % IV SOLN
2.0000 g | INTRAVENOUS | Status: AC
  Administered 2017-12-05: 2 g via INTRAVENOUS
  Filled 2017-12-05: qty 2

## 2017-12-05 MED ORDER — ONDANSETRON HCL 4 MG/2ML IJ SOLN: 4.0000 mg | Freq: Four times a day (QID) | INTRAMUSCULAR | Status: DC | PRN

## 2017-12-05 MED ORDER — ONDANSETRON HCL 4 MG PO TABS
4.0000 mg | ORAL_TABLET | Freq: Four times a day (QID) | ORAL | Status: DC | PRN
Start: 2017-12-05 — End: 2017-12-09

## 2017-12-05 MED ORDER — FENTANYL CITRATE (PF) 100 MCG/2ML IJ SOLN
50.0000 ug | Freq: Once | INTRAMUSCULAR | Status: AC
  Administered 2017-12-05: 50 ug via INTRAVENOUS
  Filled 2017-12-05: qty 2

## 2017-12-05 MED ORDER — TRIAMCINOLONE ACETONIDE 0.025 % EX CREA
1.0000 "application " | TOPICAL_CREAM | Freq: Two times a day (BID) | CUTANEOUS | Status: DC | PRN
  Filled 2017-12-05: qty 15

## 2017-12-05 MED ORDER — VANCOMYCIN HCL 10 G IV SOLR
1250.0000 mg | INTRAVENOUS | Status: DC
  Administered 2017-12-06: 1250 mg via INTRAVENOUS
  Filled 2017-12-05: qty 1250

## 2017-12-05 MED ORDER — SODIUM CHLORIDE 0.9 % IV SOLN
INTRAVENOUS | Status: DC
  Administered 2017-12-05 – 2017-12-06 (×2): via INTRAVENOUS

## 2017-12-05 MED ORDER — PROSIGHT PO TABS
1.0000 | ORAL_TABLET | Freq: Every day | ORAL | Status: DC
  Administered 2017-12-06 – 2017-12-07 (×2): 1 via ORAL
  Filled 2017-12-05 (×3): qty 1

## 2017-12-05 MED ORDER — BISACODYL 5 MG PO TBEC
5.0000 mg | DELAYED_RELEASE_TABLET | Freq: Every day | ORAL | Status: DC | PRN
  Administered 2017-12-06 – 2017-12-07 (×2): 5 mg via ORAL
  Filled 2017-12-05 (×2): qty 1

## 2017-12-05 MED ORDER — VANCOMYCIN HCL 10 G IV SOLR
1500.0000 mg | INTRAVENOUS | Status: AC
  Administered 2017-12-05: 1500 mg via INTRAVENOUS
  Filled 2017-12-05: qty 1500

## 2017-12-05 MED ORDER — FINASTERIDE 1 MG PO TABS
1.0000 mg | ORAL_TABLET | Freq: Every day | ORAL | Status: DC
  Administered 2017-12-06 – 2017-12-07 (×2): 1 mg via ORAL

## 2017-12-05 MED ORDER — LUTEIN 40 MG PO CAPS: 40.0000 mg | ORAL_CAPSULE | Freq: Every day | ORAL | Status: DC

## 2017-12-05 MED ORDER — VANCOMYCIN HCL IN DEXTROSE 1-5 GM/200ML-% IV SOLN: 1000.0000 mg | Freq: Once | INTRAVENOUS | Status: DC

## 2017-12-05 MED ORDER — LEVOTHYROXINE SODIUM 200 MCG PO TABS
200.0000 ug | ORAL_TABLET | Freq: Every day | ORAL | Status: DC
  Administered 2017-12-06: 200 ug via ORAL
  Filled 2017-12-05 (×2): qty 1
  Filled 2017-12-05: qty 2
  Filled 2017-12-05: qty 1
  Filled 2017-12-05: qty 2

## 2017-12-05 MED ORDER — FERROUS SULFATE 325 (65 FE) MG PO TABS
325.0000 mg | ORAL_TABLET | Freq: Every day | ORAL | Status: DC
  Administered 2017-12-06 – 2017-12-07 (×2): 325 mg via ORAL
  Filled 2017-12-05 (×3): qty 1

## 2017-12-05 MED ORDER — PIPERACILLIN-TAZOBACTAM 3.375 G IVPB 30 MIN: 3.3750 g | Freq: Once | INTRAVENOUS | Status: DC

## 2017-12-05 MED ORDER — PIPERACILLIN-TAZOBACTAM 3.375 G IVPB
3.3750 g | Freq: Three times a day (TID) | INTRAVENOUS | Status: DC
  Filled 2017-12-05: qty 50

## 2017-12-05 MED ORDER — DESMOPRESSIN ACETATE 0.1 MG PO TABS
0.2000 mg | ORAL_TABLET | Freq: Every day | ORAL | Status: DC
  Administered 2017-12-06 – 2017-12-07 (×2): 0.2 mg via ORAL
  Filled 2017-12-05 (×2): qty 1
  Filled 2017-12-05: qty 2
  Filled 2017-12-05: qty 1
  Filled 2017-12-05: qty 2

## 2017-12-05 MED ORDER — ACETAMINOPHEN 500 MG PO TABS
1000.0000 mg | ORAL_TABLET | Freq: Four times a day (QID) | ORAL | Status: DC | PRN
  Administered 2017-12-05 – 2017-12-07 (×4): 1000 mg via ORAL
  Filled 2017-12-05 (×4): qty 2

## 2017-12-05 NOTE — Progress Notes (Signed)
Pharmacy Antibiotic Note  Dean Stanley is a 84 y.o. male admitted on 12/05/2017 with cellulitis.  In the ED, patient has received Vancomycin 1500mg  and Cefepime 2gm IV x 1 dose each.  Upon admission, Pharmacy has been consulted for Vancomycin and Zosyn dosing.  Plan: Zosyn 3.375g IV q8h (4 hour infusion).  Vancomycin 1250mg  IV q24h (Goal AUC 400-500) Daily SCr while on Vancomycin & Zosyn Check vancomycin peak & trough levels as appropriate  Height: 5\' 8"  (172.7 cm) Weight: 157 lb (71.2 kg) IBW/kg (Calculated) : 68.4  Temp (24hrs), Avg:97.9 F (36.6 C), Min:97.9 F (36.6 C), Max:97.9 F (36.6 C)  Recent Labs  Lab 12/05/17 0925  WBC 2.6*  CREATININE 0.81    Estimated Creatinine Clearance: 65.7 mL/min (by C-G formula based on SCr of 0.81 mg/dL).    Allergies  Allergen Reactions  . Percocet [Oxycodone-Acetaminophen] Nausea And Vomiting  . Hydrocodone Nausea And Vomiting  . Oxycodone Nausea And Vomiting    Antimicrobials this admission: 3/10 Cefepime x 1 dose  3/10 Vanc >>   3/10 Zosyn >>  Dose adjustments this admission:    Microbiology results:   BCx:     UCx:      Sputum:      MRSA PCR:    Thank you for allowing pharmacy to be a part of this patient's care.  Everette Rank, PharmD 12/05/2017 11:44 AM

## 2017-12-05 NOTE — ED Triage Notes (Signed)
He c/o pain, redness and swelling of lower right leg/ankle/foot, for which he is currently on Keflex. He denies fever, but c/o much discomfort.

## 2017-12-05 NOTE — ED Notes (Signed)
ED TO INPATIENT HANDOFF REPORT  Name/Age/Gender Dean Stanley 84 y.o. male  Code Status    Code Status Orders  (From admission, onward)        Start     Ordered   12/05/17 1117  Do not attempt resuscitation (DNR)  Continuous    Question Answer Comment  In the event of cardiac or respiratory ARREST Do not call a "code blue"   In the event of cardiac or respiratory ARREST Do not perform Intubation, CPR, defibrillation or ACLS   In the event of cardiac or respiratory ARREST Use medication by any route, position, wound care, and other measures to relive pain and suffering. May use oxygen, suction and manual treatment of airway obstruction as needed for comfort.      12/05/17 1118    Code Status History    Date Active Date Inactive Code Status Order ID Comments User Context   This patient has a current code status but no historical code status.      Home/SNF/Other Home  Chief Complaint ankle infection  Level of Care/Admitting Diagnosis ED Disposition    ED Disposition Condition Comment   Admit  Hospital Area: [facility] [[id number]]  Level of Care: Med-Surg [16]  Diagnosis: Cellulitis [557322]  Admitting Physician: Patrecia Pour, EDWIN [[id number]]  Attending Physician: Patrecia Pour, EDWIN [[id number]]  PT Class (Do Not Modify): Observation [104]  PT Acc Code (Do Not Modify): Observation [10022]       Medical History Past Medical History:  Diagnosis Date  . Anemia   . Cancer (HCC)    myelodysplastic syndrome  . Complication of anesthesia    Reaction to anesthesia " things went haywire around me"  . Do not resuscitate 11/04/2015  . Hearing impairment    wears hearing left ear  . History of ITP 1960  . Hypothyroidism   . Meniere's syndrome 1980s  . Port catheter in place 12/23/2015    Allergies Allergies  Allergen Reactions  . Percocet [Oxycodone-Acetaminophen] Nausea And Vomiting  . Hydrocodone Nausea And Vomiting  . Oxycodone Nausea And  Vomiting    IV Location/Drains/Wounds Patient Lines/Drains/Airways Status   Active Line/Drains/Airways    Name:   Placement date:   Placement time:   Site:   Days:   Peripheral IV 12/05/17 Left;Lateral Forearm   12/05/17    1004    Forearm   less than 1          Labs/Imaging Results for orders placed or performed during the hospital encounter of 12/05/17 (from the past 48 hour(s))  CBC with Differential     Status: Abnormal   Collection Time: 12/05/17  9:25 AM  Result Value Ref Range   WBC 2.6 (L) 4.0 - 10.5 K/uL   RBC 2.70 (L) 4.22 - 5.81 MIL/uL   Hemoglobin 8.9 (L) 13.0 - 17.0 g/dL   HCT 27.7 (L) 39.0 - 52.0 %   MCV 102.6 (H) 78.0 - 100.0 fL   MCH 33.0 26.0 - 34.0 pg   MCHC 32.1 30.0 - 36.0 g/dL   RDW 22.3 (H) 11.5 - 15.5 %   Platelets 131 (L) 150 - 400 K/uL   Neutrophils Relative % 41 %   Lymphocytes Relative 43 %   Monocytes Relative 15 %   Eosinophils Relative 0 %   Basophils Relative 1 %   Neutro Abs 1.1 (L) 1.7 - 7.7 K/uL   Lymphs Abs 1.1 0.7 - 4.0 K/uL   Monocytes Absolute 0.4 0.1 - 1.0  K/uL   Eosinophils Absolute 0.0 0.0 - 0.7 K/uL   Basophils Absolute 0.0 0.0 - 0.1 K/uL   RBC Morphology RARE NRBCs    WBC Morphology ATYPICAL LYMPHOCYTES     Comment: DOHLE BODIES Performed at [facility], [place] [address] [place], [place] [address]   Comprehensive metabolic panel     Status: Abnormal   Collection Time: 12/05/17  9:25 AM  Result Value Ref Range   Sodium 137 135 - 145 mmol/L   Potassium 4.1 3.5 - 5.1 mmol/L   Chloride 105 101 - 111 mmol/L   CO2 25 22 - 32 mmol/L   Glucose, Bld 89 65 - 99 mg/dL   BUN 12 6 - 20 mg/dL   Creatinine, Ser 0.81 0.61 - 1.24 mg/dL   Calcium 8.8 (L) 8.9 - 10.3 mg/dL   Total Protein 7.4 6.5 - 8.1 g/dL   Albumin 3.8 3.5 - 5.0 g/dL   AST 21 15 - 41 U/L   ALT 12 (L) 17 - 63 U/L   Alkaline Phosphatase 41 38 - 126 U/L   Total Bilirubin 0.7 0.3 - 1.2 mg/dL   GFR calc non Af Amer >60 >60 mL/min   GFR calc Af Amer >60  >60 mL/min    Comment: (NOTE) The eGFR has been calculated using the CKD EPI equation. This calculation has not been validated in all clinical situations. eGFR's persistently <60 mL/min signify possible Chronic Kidney Disease.    Anion gap 7 5 - 15    Comment: Performed at [facility], [place] [address], [place], [place] [address]   No results found.  Pending Labs Unresulted Labs (From admission, onward)   Start     Ordered   12/06/17 3244  Basic metabolic panel  Tomorrow morning,   R     12/05/17 1118   12/06/17 0500  CBC  Tomorrow morning,   R     12/05/17 1118      Vitals/Pain Today's Vitals   12/05/17 0908 12/05/17 1015 12/05/17 1143 12/05/17 1144  BP: (!) 95/58   (!) 113/59  Pulse: 81   83  Resp: 16   14  Temp: 97.9 F (36.6 C)     TempSrc: Oral     SpO2: 98%   99%  Weight:  157 lb (71.2 kg)    Height:  5' 8"  (1.727 m)    PainSc:   0-No pain     Isolation Precautions No active isolations  Medications Medications  vancomycin (VANCOCIN) 1,500 mg in sodium chloride 0.9 % 500 mL IVPB (1,500 mg Intravenous New Bag/Given 12/05/17 1056)  desmopressin (DDAVP) tablet 0.2 mg (not administered)  ferrous sulfate tablet 325 mg (not administered)  finasteride (PROPECIA) tablet 1 mg (not administered)  levothyroxine (SYNTHROID, LEVOTHROID) tablet 200 mcg (not administered)  triamcinolone (KENALOG) 0.102 % cream 1 application (not administered)  0.9 %  sodium chloride infusion ( Intravenous New Bag/Given 12/05/17 1146)  ondansetron (ZOFRAN) tablet 4 mg (not administered)    Or  ondansetron (ZOFRAN) injection 4 mg (not administered)  bisacodyl (DULCOLAX) EC tablet 5 mg (not administered)  multivitamin (PROSIGHT) tablet 1 tablet (not administered)  fentaNYL (SUBLIMAZE) injection 50 mcg (50 mcg Intravenous Given 12/05/17 1004)  ceFEPIme (MAXIPIME) 2 g in sodium chloride 0.9 % 100 mL IVPB (0 g Intravenous Stopped 12/05/17 1056)    Mobility Walks, using cane  currently due to right elg

## 2017-12-05 NOTE — H&P (Signed)
History and Physical    Mir Fullilove SWF:[id number] DOB: 1931-01-23 DOA: 12/05/2017  PCP: Lorene Dy, MD Patient coming from: home  Chief Complaint: RLE swelling/pain/erythema  HPI: Dean Stanley is a delightful 84 y.o. male with medical history significant for myelodysplastic syndrome with refractory anemia, history of  ITP,hypothyroidism, Mnire's syndrome presents to emergency department with the chief complaint of worsening right lower extremity swelling/erythema/pain. Initial evaluation reveals cellulitis and [facility] are asked to admit  Information is obtained from the patient. He states possibly 10 days ago he developed gradual swelling and erythema that started mid shin on his right lower extremity. Over the next several days this swelling erythema traveled down to his ankle and then back up to the posterior leg.associated symptoms include inability to bear weight heat to touch occult he sleeping. He denies headache dizziness syncope or near-syncope. He denies nausea vomiting diarrhea. He denies fever chills. He saw his primary care provider 3 days ago and was given an injection "of some sort" and sent home with oral antibiotics. Since that time there has been no improvement in fact the symptoms continue to worsen so he came to the emergency department before he became "septic". He denies chest pain palpitation shortness of breath. He denies dysuria hematuria frequency or urgency. He denies diarrhea constipation melena bright red blood per rectum.  ED Course: n the emergency department he's afebrile with a blood pressure on the low end of normal. He is not tachycardic no tachypnea not hypoxic. He is nontoxic appearing. He is provided with vancomycin and cefepime in the emergency department.  Review of Systems: As per HPI otherwise all other systems reviewed and are negative.   Ambulatory Status: ambulates independently. Is independent with ADLs  Past Medical  History:  Diagnosis Date  . Anemia   . Cancer (HCC)    myelodysplastic syndrome  . Complication of anesthesia    Reaction to anesthesia " things went haywire around me"  . Do not resuscitate 11/04/2015  . Hearing impairment    wears hearing left ear  . History of ITP 1960  . Hypothyroidism   . Meniere's syndrome 1980s  . Port catheter in place 12/23/2015    Past Surgical History:  Procedure Laterality Date  . cataract Right 2011  . CIRCUMCISION    . COLONOSCOPY    . cortisone injections to back x 6    . ORIF ORBITAL FRACTURE Left 2000   2 fractures orbital platform    Social History   Socioeconomic History  . Marital status: Married    Spouse name: Not on file  . Number of children: Not on file  . Years of education: Not on file  . Highest education level: Not on file  Social Needs  . Financial resource strain: Not on file  . Food insecurity - worry: Not on file  . Food insecurity - inability: Not on file  . Transportation needs - medical: Not on file  . Transportation needs - non-medical: Not on file  Occupational History  . Not on file  Tobacco Use  . Smoking status: Never Smoker  . Smokeless tobacco: Never Used  Substance and Sexual Activity  . Alcohol use: Yes    Comment: Occasional  . Drug use: No  . Sexual activity: Not Currently  Other Topics Concern  . Not on file  Social History Narrative  . Not on file    Allergies  Allergen Reactions  . Percocet [Oxycodone-Acetaminophen] Nausea And Vomiting  . Hydrocodone  Nausea And Vomiting  . Oxycodone Nausea And Vomiting    Family History  Problem Relation Age of Onset  . Throat cancer Other   . Pancreatic cancer Father   . Colon cancer Brother     Prior to Admission medications   Medication Sig Start Date End Date Taking? Authorizing Provider  cephALEXin (KEFLEX) 500 MG capsule Take 500 mg by mouth 4 (four) times daily.   Yes [provider]  Darbepoetin Alfa (ARANESP) 300 MCG/0.6ML SOSY  injection Inject 300 mcg into the skin every 30 (thirty) days. As needed based on blood counts   Yes [provider]  desmopressin (DDAVP) 0.2 MG tablet Take 0.2 mg by mouth daily.   Yes [provider]  ferrous sulfate 325 (65 FE) MG tablet Take 325 mg by mouth daily with breakfast.   Yes [provider]  finasteride (PROPECIA) 1 MG tablet Take 1 mg by mouth daily with breakfast.    Yes [provider]  Flaxseed, Linseed, (FLAX SEED OIL PO) Take 1 capsule by mouth daily.   Yes [provider]  levothyroxine (SYNTHROID, LEVOTHROID) 200 MCG tablet Take 200 mcg by mouth at bedtime.    Yes [provider]  Lutein 40 MG CAPS Take 40 mg by mouth daily with breakfast.  10/09/15  Yes [provider]  Multiple Vitamins-Minerals (CENTRUM ADULTS PO) Take 1 tablet by mouth daily.    Yes [provider]  Tbo-Filgrastim (GRANIX) 300 MCG/0.5ML SOSY injection Inject 300 mcg into the skin every 30 (thirty) days.   Yes [provider]  triamcinolone (KENALOG) 0.025 % cream Apply 1 application topically 2 (two) times daily as needed (for inflammation).    Yes [provider]    Physical Exam: Vitals:   12/05/17 0908 12/05/17 1015  BP: (!) 95/58   Pulse: 81   Resp: 16   Temp: 97.9 F (36.6 C)   TempSrc: Oral   SpO2: 98%   Weight:  71.2 kg (157 lb)  Height:  5\' 8"  (1.727 m)     General:  Appears calm and comfortable, somewhat thin slightly pale but in no acute distress Eyes:  PERRL, EOMI, normal lids, iris ENT:  grossly normal hearing, lips & tongue, mucous membranes of his mouth are slightly pale but moist Neck:  no LAD, masses or thyromegaly Cardiovascular:  RRR, no m/r/g. Right leg with erythema swelling and heat from mid shin down to ankle. Right foot quite swollen and warm but no erythema. Total area quite painfulto touch Respiratory:  CTA bilaterally, no w/r/r. Normal respiratory effort. Abdomen:  soft, ntnd,  positive bowel sounds throughout no guarding or rebounding Skin:  no rash or induration seen on limited exam Musculoskeletal:  grossly normal tone BUE/BLE, good ROM, no bony abnormality Psychiatric:  grossly normal mood and affect, speech fluent and appropriate, AOx3 Neurologic:  CN 2-12 grossly intact, moves all extremities in coordinated fashion, sensation intact speech clear facial symmetry  Labs on Admission: I have personally reviewed following labs and imaging studies  CBC: Recent Labs  Lab 12/05/17 0925  WBC 2.6*  NEUTROABS 1.1*  HGB 8.9*  HCT 27.7*  MCV 102.6*  PLT 469*   Basic Metabolic Panel: Recent Labs  Lab 12/05/17 0925  NA 137  K 4.1  CL 105  CO2 25  GLUCOSE 89  BUN 12  CREATININE 0.81  CALCIUM 8.8*   GFR: Estimated Creatinine Clearance: 65.7 mL/min (by C-G formula based on SCr of 0.81 mg/dL). Liver Function Tests:  Recent Labs  Lab 12/05/17 0925  AST 21  ALT 12*  ALKPHOS 41  BILITOT 0.7  PROT 7.4  ALBUMIN 3.8   No results for input(s): LIPASE, AMYLASE in the last 168 hours. No results for input(s): AMMONIA in the last 168 hours. Coagulation Profile: No results for input(s): INR, PROTIME in the last 168 hours. Cardiac Enzymes: No results for input(s): CKTOTAL, CKMB, CKMBINDEX, TROPONINI in the last 168 hours. BNP (last 3 results) No results for input(s): PROBNP in the last 8760 hours. HbA1C: No results for input(s): HGBA1C in the last 72 hours. CBG: No results for input(s): GLUCAP in the last 168 hours. Lipid Profile: No results for input(s): CHOL, HDL, LDLCALC, TRIG, CHOLHDL, LDLDIRECT in the last 72 hours. Thyroid Function Tests: No results for input(s): TSH, T4TOTAL, FREET4, T3FREE, THYROIDAB in the last 72 hours. Anemia Panel: No results for input(s): VITAMINB12, FOLATE, FERRITIN, TIBC, IRON, RETICCTPCT in the last 72 hours. Urine analysis:    Component Value Date/Time   COLORURINE YELLOW 03/11/2017 1316   APPEARANCEUR CLEAR  03/11/2017 1316   LABSPEC 1.010 03/11/2017 1316   PHURINE 7.5 03/11/2017 1316   GLUCOSEU NEGATIVE 03/11/2017 1316   HGBUR NEGATIVE 03/11/2017 1316   BILIRUBINUR NEGATIVE 03/11/2017 1316   KETONESUR NEGATIVE 03/11/2017 1316   PROTEINUR NEGATIVE 03/11/2017 1316   NITRITE NEGATIVE 03/11/2017 1316   LEUKOCYTESUR NEGATIVE 03/11/2017 1316    Creatinine Clearance: Estimated Creatinine Clearance: 65.7 mL/min (by C-G formula based on SCr of 0.81 mg/dL).  Sepsis Labs: @LABRCNTIP (procalcitonin:4,lacticidven:4) )No results found for this or any previous visit (from the past 240 hour(s)).   Radiological Exams on Admission: No results found.  EKG: n/a  Assessment/Plan Principal Problem:   Cellulitis of leg, right Active Problems:   Deficiency anemia   Neutropenia (HCC)   Leukocytopenia   MDS (myelodysplastic syndrome) ([place])   Do not resuscitate   #1. Cellulitis of the right leg. He's been on oral antibiotics for the last 4 days and symptoms continue to worsen. No fever no leukocytosis nontoxic appearing however is immunocompromised. Is provided with vancomycin and cefepime in the emergency department. -Admit to MedSurg -Continue vancomycin -Zosyn per cellulitis protocol -Supportive therapy -Hopefully able to discharge 24-36 hours  #2.leukopenia. Likely related to his MDS. White count on admission 2.6. This appears close to his baseline. -monitor  #3. MDS. Chart review indicates he saw his hematologist 3 weeks ago. Note indicates history myelodysplastic syndrome consistent with refractory anemia with excess blast status post systemic chemotherapy. At that time plan was for him to have 2 units of packed red blood cells. Continue with Aranesp every 3 weeks in addition to oral tablets. today hemoglobin 8.9. -continue home meds -outpatient follow-up is scheduled  DVT prophylaxis: scd  Code Status: dnr  Family Communication: none present  Disposition Plan: home hopefully 24-36 hours    Consults called: none  Admission status: obs    Radene Gunning MD [facility]  If 7PM-7AM, please contact night-coverage [email or web address] Password [facility]  12/05/2017, 11:34 AM

## 2017-12-05 NOTE — Progress Notes (Signed)
A consult was received from an ED physician for Vancomycin and Cefepime per pharmacy dosing.  The patient's profile has been reviewed for ht/wt/allergies/indication/available labs.   A one time order has been placed for Vancomycin 1500mg  IV and Cefepime 2gm IV.  Further antibiotics/pharmacy consults should be ordered by admitting physician if indicated.                       Thank you, Everette Rank, PharmD 12/05/2017  10:16 AM

## 2017-12-05 NOTE — ED Provider Notes (Signed)
[facility] Provider Note   CSN: [id number] Arrival date & time: 12/05/17  8119     History   Chief Complaint Chief Complaint  Patient presents with  . Leg Swelling    HPI Dean Stanley is a 84 y.o. male with a history of myelodysplastic syndrome, symptomatic anemia, and ITP who presents to the emergency department with a chief complaint of constant, worsening redness, 10/10 pain, and swelling to the right lower leg that began earlier this week after he hit his leg with a frying pan while trying to get it out of the cabinet.  He was seen in the office by Dr. Mancel Bale 4 days ago and given one dose of IM Rocephin and d/ced with a 15-day course of Keflex, but his symptoms have significantly worsened to the point that he was unable to stand long enough to make breakfast this morning due to the pain.  Pain is worse with standing or bearing weight on the right foot and improved with nonweightbearing.  He is treated his symptoms at home with extra strength Tylenol for pain without improvement.  No missed doses of Keflex.  He denies fever, chills, nausea, vomiting, diarrhea, numbness, weakness, URI sx, chest pain, dyspnea, rash, spontaneous bleeding, or new falls or injuries.   He is a DNR.  The history is provided by the patient. No language interpreter was used.    Past Medical History:  Diagnosis Date  . Anemia   . Cancer (HCC)    myelodysplastic syndrome  . Complication of anesthesia    Reaction to anesthesia " things went haywire around me"  . Do not resuscitate 11/04/2015  . Hearing impairment    wears hearing left ear  . History of ITP 1960  . Hypothyroidism   . Meniere's syndrome 1980s  . Port catheter in place 12/23/2015    Patient Active Problem List   Diagnosis Date Noted  . Cellulitis 12/05/2017  . Cellulitis of leg, right 04/27/2016  . Port catheter in place 12/23/2015  . Do not resuscitate 11/04/2015  . Rash 10/09/2015  .  MDS (myelodysplastic syndrome) ([place]) 09/10/2015  . Neutropenia ([place]) 08/27/2015  . Leukocytopenia 08/27/2015  . Meniere's syndrome   . Deficiency anemia 12/18/2014    Past Surgical History:  Procedure Laterality Date  . cataract Right 2011  . CIRCUMCISION    . COLONOSCOPY    . cortisone injections to back x 6    . ORIF ORBITAL FRACTURE Left 2000   2 fractures orbital platform       Home Medications    Prior to Admission medications   Medication Sig Start Date End Date Taking? Authorizing Provider  cephALEXin (KEFLEX) 500 MG capsule Take 500 mg by mouth 4 (four) times daily.   Yes [provider]  Darbepoetin Alfa (ARANESP) 300 MCG/0.6ML SOSY injection Inject 300 mcg into the skin every 30 (thirty) days. As needed based on blood counts   Yes [provider]  desmopressin (DDAVP) 0.2 MG tablet Take 0.2 mg by mouth daily.   Yes [provider]  ferrous sulfate 325 (65 FE) MG tablet Take 325 mg by mouth daily with breakfast.   Yes [provider]  finasteride (PROPECIA) 1 MG tablet Take 1 mg by mouth daily with breakfast.    Yes [provider]  Flaxseed, Linseed, (FLAX SEED OIL PO) Take 1 capsule by mouth daily.   Yes [provider]  levothyroxine (SYNTHROID, LEVOTHROID) 200 MCG tablet  Take 200 mcg by mouth at bedtime.    Yes [provider]  Lutein 40 MG CAPS Take 40 mg by mouth daily with breakfast.  10/09/15  Yes [provider]  Multiple Vitamins-Minerals (CENTRUM ADULTS PO) Take 1 tablet by mouth daily.    Yes [provider]  Tbo-Filgrastim (GRANIX) 300 MCG/0.5ML SOSY injection Inject 300 mcg into the skin every 30 (thirty) days.   Yes [provider]  triamcinolone (KENALOG) 0.025 % cream Apply 1 application topically 2 (two) times daily as needed (for inflammation).    Yes [provider]    Family History Family History  Problem Relation Age of Onset  . Throat cancer Other    . Pancreatic cancer Father   . Colon cancer Brother     Social History Social History   Tobacco Use  . Smoking status: Never Smoker  . Smokeless tobacco: Never Used  Substance Use Topics  . Alcohol use: Yes    Comment: Occasional  . Drug use: No     Allergies   Percocet [oxycodone-acetaminophen]; Hydrocodone; and Oxycodone   Review of Systems Review of Systems  Constitutional: Negative for appetite change, chills and fever.  HENT: Negative for congestion.   Respiratory: Negative for shortness of breath.   Cardiovascular: Negative for chest pain.  Gastrointestinal: Negative for abdominal pain, diarrhea, nausea and vomiting.  Genitourinary: Negative for dysuria.  Musculoskeletal: Positive for arthralgias, gait problem, joint swelling and myalgias. Negative for back pain, neck pain and neck stiffness.  Skin: Positive for color change. Negative for rash.  Allergic/Immunologic: Positive for immunocompromised state.  Neurological: Negative for weakness, numbness and headaches.  Psychiatric/Behavioral: Negative for confusion.     Physical Exam Updated Vital Signs BP (!) 113/59 (BP Location: Left Arm)   Pulse 83   Temp 97.9 F (36.6 C) (Oral)   Resp 14   Ht 5\' 8"  (1.727 m)   Wt 71.2 kg (157 lb)   SpO2 99%   BMI 23.87 kg/m   Physical Exam  Constitutional: He appears well-developed.  HENT:  Head: Normocephalic.  Eyes: Conjunctivae are normal.  Neck: Normal range of motion. Neck supple.  Cardiovascular: Normal rate, regular rhythm, normal heart sounds and intact distal pulses. Exam reveals no gallop and no friction rub.  No murmur heard. Pulmonary/Chest: Effort normal. No stridor. No respiratory distress. He has no wheezes. He has no rales. He exhibits no tenderness.  Abdominal: Soft. He exhibits no distension. There is no tenderness.  Musculoskeletal:  2+ pitting edema and erythema noted to the right lower leg, beginning at the level of the ankle with diffuse  tenderness.  See pictures below.  There is a well-healing wound on the anterior aspect of the leg overlying the tibial shaft.   5 out of 5 strength against resistance with dorsiflexion plantarflexion.  DP and PT pulses are 2+ and symmetric.  Good perfusion of the digits on the right foot.  Sensation is intact throughout  Neurological: He is alert.  Skin: Skin is warm and dry. Capillary refill takes less than 2 seconds.  Psychiatric: His behavior is normal.  Nursing note and vitals reviewed.          ED Treatments / Results  Labs (all labs ordered are listed, but only abnormal results are displayed) Labs Reviewed  CBC WITH DIFFERENTIAL/PLATELET - Abnormal; Notable for the following components:      Result Value   WBC 2.6 (*)    RBC 2.70 (*)    Hemoglobin 8.9 (*)  HCT 27.7 (*)    MCV 102.6 (*)    RDW 22.3 (*)    Platelets 131 (*)    Neutro Abs 1.1 (*)    All other components within normal limits  COMPREHENSIVE METABOLIC PANEL - Abnormal; Notable for the following components:   Calcium 8.8 (*)    ALT 12 (*)    All other components within normal limits    EKG  EKG Interpretation None       Radiology No results found.  Procedures Procedures (including critical care time)  Medications Ordered in ED Medications  desmopressin (DDAVP) tablet 0.2 mg (not administered)  ferrous sulfate tablet 325 mg (not administered)  finasteride (PROPECIA) tablet 1 mg (not administered)  levothyroxine (SYNTHROID, LEVOTHROID) tablet 200 mcg (not administered)  triamcinolone (KENALOG) 2.993 % cream 1 application (not administered)  0.9 %  sodium chloride infusion ( Intravenous New Bag/Given 12/05/17 1146)  ondansetron (ZOFRAN) tablet 4 mg (not administered)    Or  ondansetron (ZOFRAN) injection 4 mg (not administered)  bisacodyl (DULCOLAX) EC tablet 5 mg (not administered)  multivitamin (PROSIGHT) tablet 1 tablet (not administered)  piperacillin-tazobactam (ZOSYN) IVPB 3.375 g  (not administered)  vancomycin (VANCOCIN) 1,250 mg in sodium chloride 0.9 % 250 mL IVPB (not administered)  fentaNYL (SUBLIMAZE) injection 50 mcg (50 mcg Intravenous Given 12/05/17 1004)  ceFEPIme (MAXIPIME) 2 g in sodium chloride 0.9 % 100 mL IVPB (0 g Intravenous Stopped 12/05/17 1056)  vancomycin (VANCOCIN) 1,500 mg in sodium chloride 0.9 % 500 mL IVPB (1,500 mg Intravenous New Bag/Given 12/05/17 1056)     Initial Impression / Assessment and Plan / ED Course  I have reviewed the triage vital signs and the nursing notes.  Pertinent labs & imaging results that were available during my care of the patient were reviewed by me and considered in my medical decision making (see chart for details).     84 year old male with history of myelodysplastic syndrome,  symptomatic anemia, and history of ITP presenting with right lower leg erythema, edema, and pain that has worsened despite one dose of IM Rocephin 4 days ago and having completed 4 days of Keflex.  The patient was seen and evaluated with Dr. Gilford Raid, attending physician, who is agreeable with the workup and plan.  Hgb 8.9, improved from 8.0 on 2/20.  CMP is unremarkable.  Cefepime and vancomycin initiated in the ED.  Clindamycin avoided due to concern for C. Diff. Pain controlled with Fentanyl. Doubt osteomyelitis or DVT.  Spoke with Thayer Dallas with hospitalist service who will admit the patient. The patient appears reasonably stabilized for admission considering the current resources, flow, and capabilities available in the ED at this time, and I doubt any other [facility] requiring further screening and/or treatment in the ED prior to admission.  Final Clinical Impressions(s) / ED Diagnoses   Final diagnoses:  Cellulitis of right lower extremity    ED Discharge Orders    None       Joanne Gavel, PA-C 12/05/17 1256    Isla Pence, MD 12/05/17 1435

## 2017-12-06 ENCOUNTER — Observation Stay (HOSPITAL_COMMUNITY): Payer: Medicare Other

## 2017-12-06 DIAGNOSIS — L03115 Cellulitis of right lower limb: Secondary | ICD-10-CM | POA: Diagnosis present

## 2017-12-06 DIAGNOSIS — D61818 Other pancytopenia: Secondary | ICD-10-CM | POA: Diagnosis present

## 2017-12-06 DIAGNOSIS — Z974 Presence of external hearing-aid: Secondary | ICD-10-CM | POA: Diagnosis not present

## 2017-12-06 DIAGNOSIS — D708 Other neutropenia: Secondary | ICD-10-CM | POA: Diagnosis not present

## 2017-12-06 DIAGNOSIS — M79609 Pain in unspecified limb: Secondary | ICD-10-CM | POA: Diagnosis not present

## 2017-12-06 DIAGNOSIS — Z9221 Personal history of antineoplastic chemotherapy: Secondary | ICD-10-CM | POA: Diagnosis not present

## 2017-12-06 DIAGNOSIS — Z66 Do not resuscitate: Secondary | ICD-10-CM | POA: Diagnosis present

## 2017-12-06 DIAGNOSIS — Z79899 Other long term (current) drug therapy: Secondary | ICD-10-CM | POA: Diagnosis not present

## 2017-12-06 DIAGNOSIS — D899 Disorder involving the immune mechanism, unspecified: Secondary | ICD-10-CM | POA: Diagnosis present

## 2017-12-06 DIAGNOSIS — I959 Hypotension, unspecified: Secondary | ICD-10-CM | POA: Diagnosis present

## 2017-12-06 DIAGNOSIS — M7989 Other specified soft tissue disorders: Secondary | ICD-10-CM | POA: Diagnosis not present

## 2017-12-06 DIAGNOSIS — Z7984 Long term (current) use of oral hypoglycemic drugs: Secondary | ICD-10-CM | POA: Diagnosis not present

## 2017-12-06 DIAGNOSIS — E611 Iron deficiency: Secondary | ICD-10-CM | POA: Diagnosis present

## 2017-12-06 DIAGNOSIS — D469 Myelodysplastic syndrome, unspecified: Secondary | ICD-10-CM | POA: Diagnosis present

## 2017-12-06 DIAGNOSIS — Z885 Allergy status to narcotic agent status: Secondary | ICD-10-CM | POA: Diagnosis not present

## 2017-12-06 DIAGNOSIS — D539 Nutritional anemia, unspecified: Secondary | ICD-10-CM | POA: Diagnosis not present

## 2017-12-06 DIAGNOSIS — E039 Hypothyroidism, unspecified: Secondary | ICD-10-CM | POA: Diagnosis present

## 2017-12-06 DIAGNOSIS — H9190 Unspecified hearing loss, unspecified ear: Secondary | ICD-10-CM | POA: Diagnosis present

## 2017-12-06 LAB — CBC
HCT: 24.5 % — ABNORMAL LOW (ref 39.0–52.0)
Hemoglobin: 7.9 g/dL — ABNORMAL LOW (ref 13.0–17.0)
MCH: 32.9 pg (ref 26.0–34.0)
MCHC: 32.2 g/dL (ref 30.0–36.0)
MCV: 102.1 fL — AB (ref 78.0–100.0)
Platelets: 120 10*3/uL — ABNORMAL LOW (ref 150–400)
RBC: 2.4 MIL/uL — AB (ref 4.22–5.81)
RDW: 22.4 % — ABNORMAL HIGH (ref 11.5–15.5)
WBC: 2.5 10*3/uL — AB (ref 4.0–10.5)

## 2017-12-06 LAB — BASIC METABOLIC PANEL
Anion gap: 5 (ref 5–15)
BUN: 11 mg/dL (ref 6–20)
CHLORIDE: 109 mmol/L (ref 101–111)
CO2: 23 mmol/L (ref 22–32)
Calcium: 8 mg/dL — ABNORMAL LOW (ref 8.9–10.3)
Creatinine, Ser: 0.67 mg/dL (ref 0.61–1.24)
GFR calc non Af Amer: >60 mL/min (ref >60)
Glucose, Bld: 89 mg/dL (ref 65–99)
POTASSIUM: 3.8 mmol/L (ref 3.5–5.1)
SODIUM: 137 mmol/L (ref 135–145)

## 2017-12-06 MED ORDER — AMMONIUM LACTATE 12 % EX LOTN
TOPICAL_LOTION | Freq: Two times a day (BID) | CUTANEOUS | Status: DC
  Administered 2017-12-06 – 2017-12-07 (×3): via TOPICAL
  Filled 2017-12-06: qty 225
  Filled 2017-12-06: qty 222

## 2017-12-06 MED ORDER — SODIUM CHLORIDE 0.9 % IV SOLN
100.0000 mg | Freq: Two times a day (BID) | INTRAVENOUS | Status: DC
  Administered 2017-12-06 – 2017-12-08 (×4): 100 mg via INTRAVENOUS
  Filled 2017-12-06 (×5): qty 100

## 2017-12-06 MED ORDER — TRAMADOL HCL 50 MG PO TABS
50.0000 mg | ORAL_TABLET | Freq: Two times a day (BID) | ORAL | Status: DC | PRN
  Administered 2017-12-08: 50 mg via ORAL
  Filled 2017-12-06: qty 1

## 2017-12-06 NOTE — Progress Notes (Addendum)
Attending MD note  Patient was seen, examined,treatment plan was discussed with the PA-S.  I have personally reviewed the clinical findings, lab, imaging studies and management of this patient in detail. I agree with the documentation, as recorded by the PA-S  Patient is a 84 year old male with medical history of MDS, ITP, hypothyroidism, Mnire's syndrome presented to the emergency department complaining of worsening of right lower extremity swelling/erythema and pain.  Patient was admitted with working diagnosis of right lower extremity cellulitis after failing outpatient therapy.    S: Patient seen and examined, having pain aggravated by contact.  Erythema and swelling slightly diminished.  Remains afebrile, no acute events overnight.  On Exam: Blood pressure 111/66, pulse 77, temperature 97.8 F (36.6 C), temperature source Oral, resp. rate 16, height 5\' 8"  (1.727 m), weight 72.7 kg (160 lb 4.4 oz), SpO2 97 %.  Gen. exam: Awake, alert, not in any distress Chest: Good air entry bilaterally, no rhonchi or rales CVS: S1-S2 regular, no murmurs Neurology: Non-focal Skin: Right lower extremity erythematous, tender, swelling has decreased but still present.  Impression/plan Right lower extremity cellulitis Failed outpatient therapy.  Initially treated with cefepime, Zosyn and was continued on IV vancomycin.  MRSA PCR is negative, therefore will switch IV vancomycin to IV doxycycline.  Continue IV antibiotics for at least 24-48 hours.  Will add tramadol 50 mg every 12 hr as needed for pain control.  Continue to monitor  Myelodysplastic syndrome/Pancytopenia Slight decrease in WBC, could be secondary to hemodilution.  No signs of overt bleeding.  Patient received frequent medications to keep hemoglobin stable.  Will check CBC in the morning, if hemoglobin less than 7 will transfuse.  Rest as below  Chipper Oman, MD     Progress Note   Dean Stanley ERX:[id number] DOB: 05-Sep-1931  DOA: 12/05/2017 PCP: Lorene Dy, MD   LOS: 0 days    Brief Narrative:  Dean Stanley is a 84 year old male with a medical history significant for myelodysplastic syndrome, ITP, hypothyroidism, and Meniere syndrome who presented to the ED on 12/05/2017 due to worsening right lower calf pain, swelling, and erythema. Patient admits to hitting his shin on his kitchen cabinent over a week ago which got infected and was treated with Keflex for the past 5 days prior to presentation without any improvement. Upon presentation to the ED, patient was afebrile at 97.9, BP 95/58, HR 81, RR 16. Initial labs demonstrated Calcium 8.8, ALT 12, WBC 2.6, RBC 2.70, Hgb 8.9, HCT 27.7, MCV 102.6, RDW 22.3, and platelets 131. He was started on IV vancomycin and Zosyn in the ED and received IVFs which he responded well to.   Patient was admitted on the working diagnosis of right lower extremity cellulitis with failure of outpatient therapy. Patient was admitted with IV vancomycin and 1 dose of cefepime.    Assessment/Plan:   Principal Problem:   Cellulitis of leg, right Active Problems:   Deficiency anemia   Neutropenia (HCC)   Leukocytopenia   MDS (myelodysplastic syndrome) (HCC)   Do not resuscitate  1. Right lower extremity cellulitis with failure of outpatient therapy: PCR was negative for MRSA. Switch IV vancomycin to IV doxycycline. Continue Kenalog cream prn twice daily for inflammation. Continue pain management with Tylenol q 6 hours and tramadol q 12 hours as needed. Discontinue IVFs and switch to oral hydration. Elevate leg while in bed or in the chair. Vascular US of lower extremity was ordered to rule in/out presence of DVT.  2. Myelodysplastic  syndrome: Latest CBC demonstrated WBC 2.5, RBC 2.4, Hgb 7.9, platelet 120, and RDW 22.4. Continue to monitor blood levels with CBC tomorrow am. Transfuse with packed red blood cells if hemoglobin drops below 7. Continue outpatient follow-up with hematologist  as scheduled.  3. Hypothyroidism: Continue Synthroid.   Family Communication/Anticipated D/C date and plan/Code Status   DVT prophylaxis: SCDs Code Status: DNR Family Communication: Family not present at bedside Disposition Plan: Home   Medical Consultants:     Antimicrobials:  IV Doxycycline   Subjective:  Patient is awake and alert. He is still complaining of significant pain, aggravated by any contact or gravity while leg is in the downward position. Patient has been tolerating po well. Patient denies any associated symptoms of chest pain, shortness of breath, fever, or chills.    Objective:   Vitals:   12-12-2017 1144 12-12-17 1300 2017-12-12 2057 12/06/17 0456  BP: (!) 113/59 123/61 (!) 101/44 111/66  Pulse: 83 88 82 77  Resp: 14 16 16 16   Temp:  (!) 97.3 F (36.3 C) 98.1 F (36.7 C) 97.8 F (36.6 C)  TempSrc:  Oral Oral Oral  SpO2: 99% 100% 96% 97%  Weight:  72.7 kg (160 lb 4.4 oz)    Height:  5\' 8"  (1.727 m)      Intake/Output Summary (Last 24 hours) at 12/06/2017 1110 Last data filed at 12/06/2017 0655 Gross per 24 hour  Intake 1460.84 ml  Output 2950 ml  Net -1489.16 ml   Filed Weights   12-12-17 1015 12-12-17 1300  Weight: 71.2 kg (157 lb) 72.7 kg (160 lb 4.4 oz)     Physical Exam:   Constitutional: NAD Eyes: lids and conjunctivae normal ENMT: Mucous membranes are moist Neck: normal, supple, no masses Respiratory: clear to auscultation bilaterally, no wheezing, no crackles. Normal respiratory effort. No accessory muscle use.  Cardiovascular: Regular rate and rhythm, normal S1-S2, no murmurs / rubs / gallops. No LE edema.  Musculoskeletal: no clubbing / cyanosis. No joint deformity upper and lower extremities. Normal muscle tone. Limited ROM of right ankle due to edema.   Skin: Right lower extremity edema, erythema, and warmth which spans from mid shin down to ankle. Tenderness to palpation with light touch of right leg. Sensation  intact. Neurologic: Non-focal Psychiatric: Alert and oriented x 3    Data Reviewed:   I have independently reviewed following labs and imaging studies:   CBC: Recent Labs  Lab 2017-12-12 0925 12/06/17 0541  WBC 2.6* 2.5*  NEUTROABS 1.1*  --   HGB 8.9* 7.9*  HCT 27.7* 24.5*  MCV 102.6* 102.1*  PLT 131* 967*   Basic Metabolic Panel: Recent Labs  Lab December 12, 2017 0925 12/06/17 0541  NA 137 137  K 4.1 3.8  CL 105 109  CO2 25 23  GLUCOSE 89 89  BUN 12 11  CREATININE 0.81 0.67  CALCIUM 8.8* 8.0*   GFR: Estimated Creatinine Clearance: 66.5 mL/min (by C-G formula based on SCr of 0.67 mg/dL). Liver Function Tests: Recent Labs  Lab 2017-12-12 0925  AST 21  ALT 12*  ALKPHOS 41  BILITOT 0.7  PROT 7.4  ALBUMIN 3.8   No results for input(s): LIPASE, AMYLASE in the last 168 hours. No results for input(s): AMMONIA in the last 168 hours. Coagulation Profile: No results for input(s): INR, PROTIME in the last 168 hours. Cardiac Enzymes: No results for input(s): CKTOTAL, CKMB, CKMBINDEX, TROPONINI in the last 168 hours. BNP (last 3 results) No results for input(s): PROBNP in the  last 8760 hours. HbA1C: No results for input(s): HGBA1C in the last 72 hours. CBG: No results for input(s): GLUCAP in the last 168 hours. Lipid Profile: No results for input(s): CHOL, HDL, LDLCALC, TRIG, CHOLHDL, LDLDIRECT in the last 72 hours. Thyroid Function Tests: No results for input(s): TSH, T4TOTAL, FREET4, T3FREE, THYROIDAB in the last 72 hours. Anemia Panel: No results for input(s): VITAMINB12, FOLATE, FERRITIN, TIBC, IRON, RETICCTPCT in the last 72 hours. Urine analysis:    Component Value Date/Time   COLORURINE YELLOW 03/11/2017 1316   APPEARANCEUR CLEAR 03/11/2017 1316   LABSPEC 1.010 03/11/2017 1316   PHURINE 7.5 03/11/2017 1316   GLUCOSEU NEGATIVE 03/11/2017 1316   HGBUR NEGATIVE 03/11/2017 1316   BILIRUBINUR NEGATIVE 03/11/2017 1316   KETONESUR NEGATIVE 03/11/2017 1316    PROTEINUR NEGATIVE 03/11/2017 1316   NITRITE NEGATIVE 03/11/2017 1316   LEUKOCYTESUR NEGATIVE 03/11/2017 1316   Sepsis Labs: Invalid input(s): PROCALCITONIN, LACTICIDVEN  Recent Results (from the past 240 hour(s))  MRSA PCR Screening     Status: None   Collection Time: 12/05/17  4:38 PM  Result Value Ref Range Status   MRSA by PCR NEGATIVE NEGATIVE Final    Comment:        The GeneXpert MRSA Assay (FDA approved for NASAL specimens only), is one component of a comprehensive MRSA colonization surveillance program. It is not intended to diagnose MRSA infection nor to guide or monitor treatment for MRSA infections. Performed at [facility], [place] [address], [place], [place] [address]       Radiology Studies: No results found.    Medication:   . desmopressin  0.2 mg Oral Daily  . ferrous sulfate  325 mg Oral Q breakfast  . finasteride  1 mg Oral Q breakfast  . levothyroxine  200 mcg Oral QHS  . multivitamin  1 tablet Oral Daily    Continuous Infusions: . doxycycline (VIBRAMYCIN) IV        Time spent: 25 minutes  Signed, Lanna Poche, PA-S [employer] 2020 Email: [email or web address]

## 2017-12-06 NOTE — Progress Notes (Signed)
Right lower extremity venous duplex completed. No obvious evidence of DVT, superficial thrombosis, or Baker's cyst. Toma Copier, RVS  12/06/2017 2:05 PM

## 2017-12-06 NOTE — Progress Notes (Signed)
Patient brought home medication desmopressin and finasteride.This RN informed patient not to take home medications without staff knowledge and explained the risk.  This RN informed patient that typically we lock medications up in pharmacy and that they would dispense medications when they are due and returned at time of discharge. Patient refused to lock medications up.

## 2017-12-06 NOTE — Progress Notes (Signed)
R leg marked

## 2017-12-07 ENCOUNTER — Encounter (HOSPITAL_COMMUNITY): Payer: Self-pay

## 2017-12-07 LAB — CBC
HEMATOCRIT: 25.2 % — AB (ref 39.0–52.0)
Hemoglobin: 8.2 g/dL — ABNORMAL LOW (ref 13.0–17.0)
MCH: 33.6 pg (ref 26.0–34.0)
MCHC: 32.5 g/dL (ref 30.0–36.0)
MCV: 103.3 fL — ABNORMAL HIGH (ref 78.0–100.0)
Platelets: 130 10*3/uL — ABNORMAL LOW (ref 150–400)
RBC: 2.44 MIL/uL — ABNORMAL LOW (ref 4.22–5.81)
RDW: 22.4 % — AB (ref 11.5–15.5)
WBC: 2.2 10*3/uL — ABNORMAL LOW (ref 4.0–10.5)

## 2017-12-07 LAB — CREATININE, SERUM
Creatinine, Ser: 0.62 mg/dL (ref 0.61–1.24)
GFR calc Af Amer: >60 mL/min (ref >60)
GFR calc non Af Amer: >60 mL/min (ref >60)

## 2017-12-07 NOTE — Progress Notes (Addendum)
Attending MD note  Patient was seen, examined,treatment plan was discussed with the PA-S.  I have personally reviewed the clinical findings, lab, imaging studies and management of this patient in detail. I agree with the documentation, as recorded by the PA-S  Patient is a 84 year old male with medical history of MDS, ITP, hypothyroidism, Mnire's syndrome presented to the emergency department complaining of worsening of right lower extremity swelling/erythema and pain.  Patient was admitted with working diagnosis of right lower extremity cellulitis after failing outpatient therapy.   S: Patient seen and examined, legs improve however swelling have migrated to the ankle.  Pain much better.  On Exam: Blood pressure 112/62, pulse 84, temperature (!) 97.1 F (36.2 C), temperature source Oral, resp. rate 16, height 5\' 8"  (1.727 m), weight 72.7 kg (160 lb 4.4 oz), SpO2 96 %.  Gen. exam: Awake, alert, not in any distress Chest: Good air entry bilaterally, no rhonchi or rales CVS: S1-S2 regular, no murmurs Abdomen: Soft, nontender and nondistended Neurology: Non-focal Skin:    Impression/plan:  Right lower extremity cellulitis Failed outpatient therapy.  Initially treated with cefepime, Zosyn and was continued on IV vancomycin. MRSA PCR is negative,  currently on IV doxycycline, slowly improving we will continue IV antibiotics for another 24 hours and evaluate if continues to improve can switch to oral doxycycline and complete therapy at home. c/w tramadol 50 mg every 12 hr as needed for pain control.  Continue to monitor.  Myelodysplastic syndrome/Pancytopenia Slight decrease in WBC, could be secondary to hemodilution.  No signs of overt bleeding.  Patient received frequent medications to keep hemoglobin stable.  Will check CBC in the morning, if hemoglobin less than 7 will transfuse.  Rest as below  Dean Oman, MD    Progress Note   Dean Stanley ACZ:[id number] DOB: Jan 23, 1931  DOA: 12/05/2017 PCP: Lorene Dy, MD   LOS: 1 day    Brief Narrative:  Dean Stanley is a 84 year old male with a medical history significant for myelodysplastic syndrome, ITP, hypothyroidism, and Meniere syndrome who presented to the ED on 12/05/2017 due to worsening right lower calf pain, swelling, and erythema. Patient admits to hitting his shin on his kitchen cabinent over a week ago which got infected and was treated with Keflex for the past 5 days prior to presentation without any improvement. Upon presentation to the ED, patient was afebrile at 97.9, BP 95/58, HR 81, RR 16. Initial labs demonstrated calcium 8.8, ALT 12, WBC 2.6, RBC 2.70, Hgb 8.9, HCT 27.7, MCV 102.6, RDW 22.3, and platelets 131. He was started on IV vancomycin and Zosyn in the ED and received IVFs which he responded well to.   Patient was admitted on the working diagnosis of right lower extremity cellulitis with failure of outpatient therapy. Patient was initially treated with Zosyn, IV vancomycin and 1 dose of cefepime. He was switched from IV vancomycin to IV doxycycline 12/06/2017 due to MRSA PCR negative. Venous doppler was unremarkable for signs of DVT.  Assessment/Plan:   Principal Problem:   Cellulitis of leg, right Active Problems:   Deficiency anemia   Neutropenia (HCC)   Leukocytopenia   MDS (myelodysplastic syndrome) (HCC)   Do not resuscitate   Cellulitis  1. Right lower extremity cellulitis with failure of outpatient therapy: Continue IV doxycyline for another 24 hours then switch to oral doxycycline with plans of discharge either tomorrow afternoon or Thursday morning. Patient is tolerating abx well with no new complaints. Continue Lac-hydrin lotion twice daily to  affected regions. Pain levels are well controlled when patient is immobile in bed. Continue pain management with Tylenol q 6 hours and tramadol q 12 hours as needed. Continue leg elevation when possible. Allow patient to ambulate and  shower.  2. Myelodysplastic syndrome/ Pancytopenia: No signs of overt bleeding. Patient is currently asymptomatic. Latest CBC demonstrated WBC 2.2, RBC 2.44, Hgb 8.2, platelet 130, and RDW 22.4. Continue to monitor blood levels with CBC tomorrow am. Transfuse with packed red blood cells if hemoglobin drops below 7. Continue outpatient follow-up with hematologist as scheduled.  3. Hypothyroidism: Continue Synthroid.   Family Communication/Anticipated D/C date and plan/Code Status   DVT prophylaxis: SCDs Code Status: DNR Family Communication: Wife present at bedside Disposition Plan: Home   Medical Consultants:      Antimicrobials:  Iv Doxycycline   Subjective:  Patient is awake and alert. He has no new complaints. His pain is well controlled with current pain medication and only complains of aggravation by contact and ambulation. Erythema and swelling slightly increased with purple discoloration which is typical for normal healing pattern of cellulitis. He remains afebrile with no acute events overnight.   Objective:   Vitals:   12/06/17 0456 12/06/17 1409 12/06/17 2113 12/07/17 0524  BP: 111/66 (!) 115/55 (!) 110/56 116/61  Pulse: 77 78 72 74  Resp: 16 16 16 16   Temp: 97.8 F (36.6 C) 97.6 F (36.4 C) (!) 97.5 F (36.4 C) 98.1 F (36.7 C)  TempSrc: Oral Oral Oral Oral  SpO2: 97% 98% 99% 96%  Weight:      Height:        Intake/Output Summary (Last 24 hours) at 12/07/2017 1054 Last data filed at 12/07/2017 0800 Gross per 24 hour  Intake 1090 ml  Output 1200 ml  Net -110 ml   Filed Weights   2017/12/24 1015 December 24, 2017 1300  Weight: 71.2 kg (157 lb) 72.7 kg (160 lb 4.4 oz)     Physical Exam:   Constitutional: NAD, awake and alert Eyes: lids and conjunctivae normal ENMT: Mucous membranes are moist. Neck: normal, supple, no masses Respiratory: clear to auscultation bilaterally, no wheezing, no crackles. Normal respiratory effort. No accessory muscle use.   Cardiovascular: Regular rate and rhythm, normal S1-S2, no murmurs / rubs / gallops. No LE edema.  Skin: Right lower extremity erythematous, tender, and edematous which has shifted to a more dependent region of his ankle.     Neurologic: non-focal Psychiatric: Alert and oriented x 3    Data Reviewed:   I have independently reviewed following labs and imaging studies:   CBC: Recent Labs  Lab Dec 24, 2017 0925 12/06/17 0541 12/07/17 0605  WBC 2.6* 2.5* 2.2*  NEUTROABS 1.1*  --   --   HGB 8.9* 7.9* 8.2*  HCT 27.7* 24.5* 25.2*  MCV 102.6* 102.1* 103.3*  PLT 131* 120* 947*   Basic Metabolic Panel: Recent Labs  Lab 2017-12-24 0925 12/06/17 0541 12/07/17 0605  NA 137 137  --   K 4.1 3.8  --   CL 105 109  --   CO2 25 23  --   GLUCOSE 89 89  --   BUN 12 11  --   CREATININE 0.81 0.67 0.62  CALCIUM 8.8* 8.0*  --    GFR: Estimated Creatinine Clearance: 66.5 mL/min (by C-G formula based on SCr of 0.62 mg/dL). Liver Function Tests: Recent Labs  Lab 2017/12/24 0925  AST 21  ALT 12*  ALKPHOS 41  BILITOT 0.7  PROT 7.4  ALBUMIN 3.8  No results for input(s): LIPASE, AMYLASE in the last 168 hours. No results for input(s): AMMONIA in the last 168 hours. Coagulation Profile: No results for input(s): INR, PROTIME in the last 168 hours. Cardiac Enzymes: No results for input(s): CKTOTAL, CKMB, CKMBINDEX, TROPONINI in the last 168 hours. BNP (last 3 results) No results for input(s): PROBNP in the last 8760 hours. HbA1C: No results for input(s): HGBA1C in the last 72 hours. CBG: No results for input(s): GLUCAP in the last 168 hours. Lipid Profile: No results for input(s): CHOL, HDL, LDLCALC, TRIG, CHOLHDL, LDLDIRECT in the last 72 hours. Thyroid Function Tests: No results for input(s): TSH, T4TOTAL, FREET4, T3FREE, THYROIDAB in the last 72 hours. Anemia Panel: No results for input(s): VITAMINB12, FOLATE, FERRITIN, TIBC, IRON, RETICCTPCT in the last 72 hours. Urine analysis:     Component Value Date/Time   COLORURINE YELLOW 03/11/2017 1316   APPEARANCEUR CLEAR 03/11/2017 1316   LABSPEC 1.010 03/11/2017 1316   PHURINE 7.5 03/11/2017 1316   GLUCOSEU NEGATIVE 03/11/2017 1316   HGBUR NEGATIVE 03/11/2017 1316   BILIRUBINUR NEGATIVE 03/11/2017 1316   KETONESUR NEGATIVE 03/11/2017 1316   PROTEINUR NEGATIVE 03/11/2017 1316   NITRITE NEGATIVE 03/11/2017 1316   LEUKOCYTESUR NEGATIVE 03/11/2017 1316   Sepsis Labs: Invalid input(s): PROCALCITONIN, LACTICIDVEN  Recent Results (from the past 240 hour(s))  MRSA PCR Screening     Status: None   Collection Time: 12/05/17  4:38 PM  Result Value Ref Range Status   MRSA by PCR NEGATIVE NEGATIVE Final    Comment:        The GeneXpert MRSA Assay (FDA approved for NASAL specimens only), is one component of a comprehensive MRSA colonization surveillance program. It is not intended to diagnose MRSA infection nor to guide or monitor treatment for MRSA infections. Performed at [facility], [place] [address] [place], [place] [address]       Radiology Studies: No results found.    Medication:   . ammonium lactate   Topical BID  . desmopressin  0.2 mg Oral Daily  . ferrous sulfate  325 mg Oral Q breakfast  . finasteride  1 mg Oral Q breakfast  . levothyroxine  200 mcg Oral QHS  . multivitamin  1 tablet Oral Daily    Continuous Infusions: . doxycycline (VIBRAMYCIN) IV 100 mg (12/07/17 0905)      Time spent: 20 minutes  Signed, Lanna Poche, PA-S [employer] 2020 Email: [email or web address]

## 2017-12-08 DIAGNOSIS — D469 Myelodysplastic syndrome, unspecified: Secondary | ICD-10-CM

## 2017-12-08 DIAGNOSIS — L03115 Cellulitis of right lower limb: Principal | ICD-10-CM

## 2017-12-08 DIAGNOSIS — D708 Other neutropenia: Secondary | ICD-10-CM

## 2017-12-08 DIAGNOSIS — D539 Nutritional anemia, unspecified: Secondary | ICD-10-CM

## 2017-12-08 LAB — CBC WITH DIFFERENTIAL/PLATELET
BASOS ABS: 0 10*3/uL (ref 0.0–0.1)
BASOS PCT: 0 %
BLASTS: 2 %
EOS ABS: 0 10*3/uL (ref 0.0–0.7)
EOS PCT: 0 %
HEMATOCRIT: 26.1 % — AB (ref 39.0–52.0)
HEMOGLOBIN: 8.4 g/dL — AB (ref 13.0–17.0)
Lymphocytes Relative: 54 %
Lymphs Abs: 1.1 10*3/uL (ref 0.7–4.0)
MCH: 32.7 pg (ref 26.0–34.0)
MCHC: 32.2 g/dL (ref 30.0–36.0)
MCV: 101.6 fL — ABNORMAL HIGH (ref 78.0–100.0)
METAMYELOCYTES PCT: 1 %
MYELOCYTES: 2 %
Monocytes Absolute: 0.2 10*3/uL (ref 0.1–1.0)
Monocytes Relative: 10 %
Neutro Abs: 0.7 10*3/uL — ABNORMAL LOW (ref 1.7–7.7)
Neutrophils Relative %: 31 %
Platelets: 129 10*3/uL — ABNORMAL LOW (ref 150–400)
RBC: 2.57 MIL/uL — ABNORMAL LOW (ref 4.22–5.81)
RDW: 22.1 % — ABNORMAL HIGH (ref 11.5–15.5)
WBC: 2 10*3/uL — AB (ref 4.0–10.5)
nRBC: 1 /100 WBC — ABNORMAL HIGH

## 2017-12-08 LAB — CREATININE, SERUM
Creatinine, Ser: 0.64 mg/dL (ref 0.61–1.24)
GFR calc Af Amer: >60 mL/min
GFR calc non Af Amer: >60 mL/min

## 2017-12-08 MED ORDER — CEFAZOLIN SODIUM-DEXTROSE 1-4 GM/50ML-% IV SOLN
1.0000 g | Freq: Three times a day (TID) | INTRAVENOUS | Status: DC
  Administered 2017-12-08 – 2017-12-09 (×3): 1 g via INTRAVENOUS
  Filled 2017-12-08 (×5): qty 50

## 2017-12-08 NOTE — Progress Notes (Signed)
PROGRESS NOTE    Dean Stanley  NUU:[id number] DOB: [birth year] DOA: 12/05/2017 PCP: Lorene Dy, MD    Brief Narrative:  84 year old male who presented with right lower extremity swelling, pain and erythema.  Patient does have a significant past medical history for myelodysplastic syndrome with respiratory anemia, history of ITP, hypothyroidism and Mnire's disease.  He complained of 10 days of worsening and expanding erythema on his right lower extremity, associated with pain and edema.  On his initial physical examination blood pressure 95/58, heart rate 81, respiratory 16, temp 97.9, oxygen saturation 98%.  Moist mucous membranes, lungs clear to auscultation bilaterally, heart S1-S2 present and rhythmic, the abdomen was soft nontender, right leg with erythema and edema, tender to palpation, increased local temperature.  Sodium 137, potassium 4.1, chloride 105, bicarb 25, glucose 89, BUN 12, creatinine 0.81, white count 2.6, hemoglobin 8.9, hematocrit 37.7, platelets 131.  Patient was admitted to the hospital with the working diagnosis of right lower extremity cellulitis.   Assessment & Plan:   Principal Problem:   Cellulitis of leg, right Active Problems:   Deficiency anemia   Neutropenia (HCC)   Leukocytopenia   MDS (myelodysplastic syndrome) (HCC)   Do not resuscitate   Cellulitis   1. Right lower extremity cellulitis. Will change antibiotic therapy to cefazolin, for non purulent cellulitis, will hold on doxycyline, if continue to improve will plan for discharge in am with oral antibiotic therapy.   2. Myelodysplastic syndrome. Cell count stable, no need for transfusion.   3. Hypothyroid. Continue levothyroxine.   4. Iron deficiency. Continue iron supplementation      DVT prophylaxis: enoxaparin   Code Status:  full Family Communication: I spoke with patient's family at the bedside and all questions were addressed.  Disposition Plan: Home in am if continue to  improve rash.    Consultants:     Procedures:     Antimicrobials:   Cefazolin  Doxycyline    Subjective: Patient feeling better, right leg with erythema and tenderness, improved. No nausea or vomiting.   Objective: Vitals:   12/07/17 0524 12/07/17 1300 12/07/17 2228 12/08/17 0612  BP: 116/61 112/62 117/60 (!) 114/59  Pulse: 74 84 72 68  Resp: 16 16 16 16   Temp: 98.1 F (36.7 C) (!) 97.1 F (36.2 C) 98.5 F (36.9 C) 97.7 F (36.5 C)  TempSrc: Oral Oral Oral Oral  SpO2: 96% 96% 97% 97%  Weight:      Height:        Intake/Output Summary (Last 24 hours) at 12/08/2017 1439 Last data filed at 12/08/2017 3474 Gross per 24 hour  Intake -  Output 1050 ml  Net -1050 ml   Filed Weights   12/05/17 1015 12/05/17 1300  Weight: 71.2 kg (157 lb) 72.7 kg (160 lb 4.4 oz)    Examination:   General: Not in pain or dyspnea, deconditioned Neurology: Awake and alert, non focal  E ENT: no pallor, no icterus, oral mucosa moist Cardiovascular: No JVD. S1-S2 present, rhythmic, no gallops, rubs, or murmurs. No lower extremity edema. Pulmonary: vesicular breath sounds bilaterally, adequate air movement, no wheezing, rhonchi or rales. Gastrointestinal. Abdomen flat, no organomegaly, non tender, no rebound or guarding Skin.riht leg with improved erythema, but distal region with significant erythema, tenderness and increase local temperature.  Musculoskeletal: no joint deformities     Data Reviewed: I have personally reviewed following labs and imaging studies  CBC: Recent Labs  Lab 12/05/17 0925 12/06/17 0541 12/07/17 0605 12/08/17 0610  WBC  2.6* 2.5* 2.2* 2.0*  NEUTROABS 1.1*  --   --  0.7*  HGB 8.9* 7.9* 8.2* 8.4*  HCT 27.7* 24.5* 25.2* 26.1*  MCV 102.6* 102.1* 103.3* 101.6*  PLT 131* 120* 130* 505*   Basic Metabolic Panel: Recent Labs  Lab 12/05/17 0925 12/06/17 0541 12/07/17 0605 12/08/17 0610  NA 137 137  --   --   K 4.1 3.8  --   --   CL 105 109  --   --    CO2 25 23  --   --   GLUCOSE 89 89  --   --   BUN 12 11  --   --   CREATININE 0.81 0.67 0.62 0.64  CALCIUM 8.8* 8.0*  --   --    GFR: Estimated Creatinine Clearance: 66.5 mL/min (by C-G formula based on SCr of 0.64 mg/dL). Liver Function Tests: Recent Labs  Lab 12/05/17 0925  AST 21  ALT 12*  ALKPHOS 41  BILITOT 0.7  PROT 7.4  ALBUMIN 3.8   No results for input(s): LIPASE, AMYLASE in the last 168 hours. No results for input(s): AMMONIA in the last 168 hours. Coagulation Profile: No results for input(s): INR, PROTIME in the last 168 hours. Cardiac Enzymes: No results for input(s): CKTOTAL, CKMB, CKMBINDEX, TROPONINI in the last 168 hours. BNP (last 3 results) No results for input(s): PROBNP in the last 8760 hours. HbA1C: No results for input(s): HGBA1C in the last 72 hours. CBG: No results for input(s): GLUCAP in the last 168 hours. Lipid Profile: No results for input(s): CHOL, HDL, LDLCALC, TRIG, CHOLHDL, LDLDIRECT in the last 72 hours. Thyroid Function Tests: No results for input(s): TSH, T4TOTAL, FREET4, T3FREE, THYROIDAB in the last 72 hours. Anemia Panel: No results for input(s): VITAMINB12, FOLATE, FERRITIN, TIBC, IRON, RETICCTPCT in the last 72 hours.    Radiology Studies: I have reviewed all of the imaging during this hospital visit personally     Scheduled Meds: . ammonium lactate   Topical BID  . desmopressin  0.2 mg Oral Daily  . ferrous sulfate  325 mg Oral Q breakfast  . finasteride  1 mg Oral Q breakfast  . levothyroxine  200 mcg Oral QHS  . multivitamin  1 tablet Oral Daily   Continuous Infusions: .  ceFAZolin (ANCEF) IV Stopped (12/08/17 1228)  . doxycycline (VIBRAMYCIN) IV Stopped (12/08/17 1111)     LOS: 2 days        Starkeisha Vanwinkle Gerome Apley, MD [facility] Pager [phone]

## 2017-12-08 NOTE — Progress Notes (Signed)
Pt remains adament about taking his own meds from home that he has in a med dispenser or med bottles on his bedside table. When asked what he took he stated the names of the meds he was due tonight.

## 2017-12-09 LAB — CBC WITH DIFFERENTIAL/PLATELET
BASOS PCT: 1 %
Basophils Absolute: 0 10*3/uL (ref 0.0–0.1)
EOS ABS: 0 10*3/uL (ref 0.0–0.7)
Eosinophils Relative: 2 %
HCT: 25.7 % — ABNORMAL LOW (ref 39.0–52.0)
Hemoglobin: 8.2 g/dL — ABNORMAL LOW (ref 13.0–17.0)
Lymphocytes Relative: 55 %
Lymphs Abs: 1.1 10*3/uL (ref 0.7–4.0)
MCH: 32.4 pg (ref 26.0–34.0)
MCHC: 31.9 g/dL (ref 30.0–36.0)
MCV: 101.6 fL — AB (ref 78.0–100.0)
MONO ABS: 0.3 10*3/uL (ref 0.1–1.0)
Monocytes Relative: 18 %
NEUTROS PCT: 24 %
Neutro Abs: 0.5 10*3/uL — ABNORMAL LOW (ref 1.7–7.7)
PLATELETS: 127 10*3/uL — AB (ref 150–400)
RBC: 2.53 MIL/uL — ABNORMAL LOW (ref 4.22–5.81)
RDW: 22.3 % — AB (ref 11.5–15.5)
WBC: 1.9 10*3/uL — AB (ref 4.0–10.5)

## 2017-12-09 LAB — CREATININE, SERUM
CREATININE: 0.76 mg/dL (ref 0.61–1.24)
GFR calc Af Amer: >60 mL/min (ref >60)
GFR calc non Af Amer: >60 mL/min (ref >60)

## 2017-12-09 MED ORDER — AMOXICILLIN-POT CLAVULANATE 875-125 MG PO TABS
1.0000 | ORAL_TABLET | Freq: Two times a day (BID) | ORAL | Status: DC
  Administered 2017-12-09: 1 via ORAL
  Filled 2017-12-09: qty 1

## 2017-12-09 MED ORDER — AMOXICILLIN-POT CLAVULANATE 875-125 MG PO TABS: 1.0000 | ORAL_TABLET | Freq: Two times a day (BID) | ORAL | 0 refills | Status: AC

## 2017-12-09 NOTE — Discharge Summary (Signed)
Physician Discharge Summary  Dean Stanley SNK:[id number] DOB: 01-16-1931 DOA: 12/05/2017  PCP: Lorene Dy, MD  Admit date: 12/05/2017 Discharge date: 12/09/2017  Admitted From: Home Disposition:  Home  Recommendations for Outpatient Follow-up and new medication changes:  1. Follow up with PCP in 1- weeks 2. Patient will continue antibiotic therapy with Augmentin for the next 7 days.  Home Health: no   Equipment/Devices: no   Discharge Condition: stable CODE STATUS: full  Diet recommendation: heart healthy  Brief/Interim Summary: 84 year old male who presented with right lower extremity swelling, pain and erythema.  Patient does have a significant past medical history for myelodysplastic syndrome with iron deficiency anemia, history of ITP, hypothyroidism and Mnire's disease.  He complained of 10 days of worsening and expanding erythema on his right lower extremity, associated with pain and edema.  On his initial physical examination blood pressure 95/58, heart rate 81, respiratory rate 16, temp 97.9, oxygen saturation 98%.  Moist mucous membranes, lungs clear to auscultation bilaterally, heart S1-S2 present and rhythmic, the abdomen was soft nontender, right leg with erythema and edema, tender to palpation, increased local temperature.  Sodium 137, potassium 4.1, chloride 105, bicarb 25, glucose 89, BUN 12, creatinine 0.81, white count 2.6, hemoglobin 8.9, hematocrit 37.7, platelets 131.  Patient was admitted to the hospital with the working diagnosis of right lower extremity cellulitis.  1. Right lower extremity cellulitis. Patient was admitted to the medical unit, placed on broad spectrum IV antibiotic with significant improvement of his symptoms.  Antibiotics were successfully narrowed to IV cefazolin, patient will finish antibiotic course with oral Augmentin.  Follow-up within 7 days.  Blood cultures remain no growth, patient remained afebrile, white cell count at discharge  1.9.   2.  Myelodysplastic syndrome.  Cell count was monitored closely, no need for transfusion.   3.  Iron deficiency.  Continue iron supplements.  4.  Hypothyroid.  Continue levothyroxine.  Discharge Diagnoses:  Principal Problem:   Cellulitis of leg, right Active Problems:   Deficiency anemia   Neutropenia (HCC)   Leukocytopenia   MDS (myelodysplastic syndrome) (HCC)   Do not resuscitate   Cellulitis    Discharge Instructions   Allergies as of 12/09/2017      Reactions   Percocet [oxycodone-acetaminophen] Nausea And Vomiting   Hydrocodone Nausea And Vomiting   Oxycodone Nausea And Vomiting      Medication List    STOP taking these medications   cephALEXin 500 MG capsule Commonly known as:  KEFLEX     TAKE these medications   amoxicillin-clavulanate 875-125 MG tablet Commonly known as:  AUGMENTIN Take 1 tablet by mouth 2 (two) times daily for 7 days.   CENTRUM ADULTS PO Take 1 tablet by mouth daily.   Darbepoetin Alfa 300 MCG/0.6ML Sosy injection Commonly known as:  ARANESP Inject 300 mcg into the skin every 30 (thirty) days. As needed based on blood counts   desmopressin 0.2 MG tablet Commonly known as:  DDAVP Take 0.2 mg by mouth daily.   ferrous sulfate 325 (65 FE) MG tablet Take 325 mg by mouth daily with breakfast.   finasteride 1 MG tablet Commonly known as:  PROPECIA Take 1 mg by mouth daily with breakfast.   FLAX SEED OIL PO Take 1 capsule by mouth daily.   GRANIX 300 MCG/0.5ML Sosy injection Generic drug:  Tbo-Filgrastim Inject 300 mcg into the skin every 30 (thirty) days.   levothyroxine 200 MCG tablet Commonly known as:  SYNTHROID, LEVOTHROID Take 200 mcg by  mouth at bedtime.   Lutein 40 MG Caps Take 40 mg by mouth daily with breakfast.   triamcinolone 0.025 % cream Commonly known as:  KENALOG Apply 1 application topically 2 (two) times daily as needed (for inflammation).       Allergies  Allergen Reactions  . Percocet  [Oxycodone-Acetaminophen] Nausea And Vomiting  . Hydrocodone Nausea And Vomiting  . Oxycodone Nausea And Vomiting    Consultations:     Procedures/Studies:  No results found.    Subjective: Patient is feeling better, rash has significantly improved, decrease pain, patient able to ambulate, no nausea or vomiting.  No diarrhea.  Discharge Exam: Vitals:   12/08/17 1500 12/08/17 2200  BP: (!) 120/59 114/64  Pulse: 79 89  Resp: 15 16  Temp: 98.1 F (36.7 C) 98.2 F (36.8 C)  SpO2: 97% 96%   Vitals:   12/07/17 2228 12/08/17 0612 12/08/17 1500 12/08/17 2200  BP: 117/60 (!) 114/59 (!) 120/59 114/64  Pulse: 72 68 79 89  Resp: 16 16 15 16   Temp: 98.5 F (36.9 C) 97.7 F (36.5 C) 98.1 F (36.7 C) 98.2 F (36.8 C)  TempSrc: Oral Oral Oral Oral  SpO2: 97% 97% 97% 96%  Weight:      Height:        General: Not in pain or dyspnea  Neurology: Awake and alert, non focal  E ENT: no pallor, no icterus, oral mucosa moist Cardiovascular: No JVD. S1-S2 present, rhythmic, no gallops, rubs, or murmurs. No lower extremity edema. Pulmonary: vesicular breath sounds bilaterally, adequate air movement, no wheezing, rhonchi or rales. Gastrointestinal. Abdomen flat, no organomegaly, non tender, no rebound or guarding Skin. Right leg with distal discoloration, no edema, no increase local temperature or tenderness.  Musculoskeletal: no joint deformities   The results of significant diagnostics from this hospitalization (including imaging, microbiology, ancillary and laboratory) are listed below for reference.     Microbiology: Recent Results (from the past 240 hour(s))  MRSA PCR Screening     Status: None   Collection Time: 12/05/17  4:38 PM  Result Value Ref Range Status   MRSA by PCR NEGATIVE NEGATIVE Final    Comment:        The GeneXpert MRSA Assay (FDA approved for NASAL specimens only), is one component of a comprehensive MRSA colonization surveillance program. It is  not intended to diagnose MRSA infection nor to guide or monitor treatment for MRSA infections. Performed at [facility], [place] [address] [place], [place] [address]      Labs: BNP (last 3 results) No results for input(s): BNP in the last 8760 hours. Basic Metabolic Panel: Recent Labs  Lab 12/05/17 0925 12/06/17 0541 12/07/17 0605 12/08/17 0610 12/09/17 0631  NA 137 137  --   --   --   K 4.1 3.8  --   --   --   CL 105 109  --   --   --   CO2 25 23  --   --   --   GLUCOSE 89 89  --   --   --   BUN 12 11  --   --   --   CREATININE 0.81 0.67 0.62 0.64 0.76  CALCIUM 8.8* 8.0*  --   --   --    Liver Function Tests: Recent Labs  Lab 12/05/17 0925  AST 21  ALT 12*  ALKPHOS 41  BILITOT 0.7  PROT 7.4  ALBUMIN 3.8   No results for  input(s): LIPASE, AMYLASE in the last 168 hours. No results for input(s): AMMONIA in the last 168 hours. CBC: Recent Labs  Lab 12/05/17 0925 12/06/17 0541 12/07/17 0605 12/08/17 0610 12/09/17 0631  WBC 2.6* 2.5* 2.2* 2.0* 1.9*  NEUTROABS 1.1*  --   --  0.7* 0.5*  HGB 8.9* 7.9* 8.2* 8.4* 8.2*  HCT 27.7* 24.5* 25.2* 26.1* 25.7*  MCV 102.6* 102.1* 103.3* 101.6* 101.6*  PLT 131* 120* 130* 129* 127*   Cardiac Enzymes: No results for input(s): CKTOTAL, CKMB, CKMBINDEX, TROPONINI in the last 168 hours. BNP: Invalid input(s): POCBNP CBG: No results for input(s): GLUCAP in the last 168 hours. D-Dimer No results for input(s): DDIMER in the last 72 hours. Hgb A1c No results for input(s): HGBA1C in the last 72 hours. Lipid Profile No results for input(s): CHOL, HDL, LDLCALC, TRIG, CHOLHDL, LDLDIRECT in the last 72 hours. Thyroid function studies No results for input(s): TSH, T4TOTAL, T3FREE, THYROIDAB in the last 72 hours.  Invalid input(s): FREET3 Anemia work up No results for input(s): VITAMINB12, FOLATE, FERRITIN, TIBC, IRON, RETICCTPCT in the last 72 hours. Urinalysis    Component Value Date/Time   COLORURINE  YELLOW 03/11/2017 1316   APPEARANCEUR CLEAR 03/11/2017 1316   LABSPEC 1.010 03/11/2017 1316   PHURINE 7.5 03/11/2017 1316   GLUCOSEU NEGATIVE 03/11/2017 1316   HGBUR NEGATIVE 03/11/2017 1316   BILIRUBINUR NEGATIVE 03/11/2017 1316   KETONESUR NEGATIVE 03/11/2017 1316   PROTEINUR NEGATIVE 03/11/2017 1316   NITRITE NEGATIVE 03/11/2017 1316   LEUKOCYTESUR NEGATIVE 03/11/2017 1316   Sepsis Labs Invalid input(s): PROCALCITONIN,  WBC,  LACTICIDVEN Microbiology Recent Results (from the past 240 hour(s))  MRSA PCR Screening     Status: None   Collection Time: 12/05/17  4:38 PM  Result Value Ref Range Status   MRSA by PCR NEGATIVE NEGATIVE Final    Comment:        The GeneXpert MRSA Assay (FDA approved for NASAL specimens only), is one component of a comprehensive MRSA colonization surveillance program. It is not intended to diagnose MRSA infection nor to guide or monitor treatment for MRSA infections. Performed at [facility], [place] [address] [place], [place] [address]      Time coordinating discharge: 45 minutes  SIGNED:   Tawni Millers, MD  [facility] 12/09/2017, 11:34 AM Pager [phone]  If 7PM-7AM, please contact night-coverage [email or web address] Password [email or web address]

## 2017-12-10 ENCOUNTER — Other Ambulatory Visit: Payer: Self-pay | Admitting: Medical Oncology

## 2017-12-10 DIAGNOSIS — D469 Myelodysplastic syndrome, unspecified: Secondary | ICD-10-CM

## 2017-12-13 ENCOUNTER — Inpatient Hospital Stay: Payer: Medicare Other | Attending: Internal Medicine

## 2017-12-13 ENCOUNTER — Inpatient Hospital Stay: Payer: Medicare Other

## 2017-12-13 VITALS — BP 106/67 | HR 82 | Temp 97.7°F | Resp 18

## 2017-12-13 DIAGNOSIS — D469 Myelodysplastic syndrome, unspecified: Secondary | ICD-10-CM | POA: Diagnosis not present

## 2017-12-13 DIAGNOSIS — D539 Nutritional anemia, unspecified: Secondary | ICD-10-CM

## 2017-12-13 LAB — CBC WITH DIFFERENTIAL (CANCER CENTER ONLY)
BASOS PCT: 1 %
Basophils Absolute: 0 10*3/uL (ref 0.0–0.1)
EOS ABS: 0 10*3/uL (ref 0.0–0.5)
Eosinophils Relative: 2 %
HCT: 25.6 % — ABNORMAL LOW (ref 38.4–49.9)
Hemoglobin: 8 g/dL — ABNORMAL LOW (ref 13.0–17.1)
Lymphocytes Relative: 63 %
Lymphs Abs: 1.1 10*3/uL (ref 0.9–3.3)
MCH: 31.9 pg (ref 27.2–33.4)
MCHC: 31.3 g/dL — ABNORMAL LOW (ref 32.0–36.0)
MCV: 102 fL — ABNORMAL HIGH (ref 79.3–98.0)
Monocytes Absolute: 0.2 10*3/uL (ref 0.1–0.9)
Monocytes Relative: 14 %
NEUTROS PCT: 20 %
NRBC: 2 /100{WBCs} — AB
Neutro Abs: 0.3 10*3/uL — CL (ref 1.5–6.5)
PLATELETS: 102 10*3/uL — AB (ref 140–400)
RBC: 2.51 MIL/uL — ABNORMAL LOW (ref 4.20–5.82)
RDW: 22.3 % — ABNORMAL HIGH (ref 11.0–14.6)
WBC: 1.7 10*3/uL — AB (ref 4.0–10.3)

## 2017-12-13 LAB — SAMPLE TO BLOOD BANK

## 2017-12-13 MED ORDER — DARBEPOETIN ALFA 300 MCG/0.6ML IJ SOSY
PREFILLED_SYRINGE | INTRAMUSCULAR | Status: AC
  Filled 2017-12-13: qty 0.6

## 2017-12-13 MED ORDER — DARBEPOETIN ALFA 300 MCG/0.6ML IJ SOSY
300.0000 ug | PREFILLED_SYRINGE | Freq: Once | INTRAMUSCULAR | Status: AC
  Administered 2017-12-13: 300 ug via SUBCUTANEOUS

## 2017-12-13 NOTE — Patient Instructions (Signed)

## 2017-12-13 NOTE — Progress Notes (Signed)
Hemoglobin noted at 8.0. Pt instructed to leave blue braclet in place  And to call if he feels he needs a blood transfusion

## 2017-12-14 ENCOUNTER — Telehealth: Payer: Self-pay | Admitting: Medical Oncology

## 2017-12-14 NOTE — Telephone Encounter (Signed)
Pt says he does not think he needs blood right now. He says if he was sleeping all the time he would say yes to blood , but he said he is not sleeping a lot. Wife notified.

## 2017-12-31 ENCOUNTER — Other Ambulatory Visit: Payer: Self-pay | Admitting: *Deleted

## 2017-12-31 DIAGNOSIS — D469 Myelodysplastic syndrome, unspecified: Secondary | ICD-10-CM

## 2018-01-03 ENCOUNTER — Inpatient Hospital Stay: Payer: Medicare Other

## 2018-01-03 ENCOUNTER — Ambulatory Visit (HOSPITAL_COMMUNITY)
Admission: RE | Admit: 2018-01-03 | Discharge: 2018-01-03 | Disposition: A | Discharge status: Home / Self Care | Payer: Medicare Other | Source: Ambulatory Visit | Attending: Internal Medicine | Admitting: Internal Medicine

## 2018-01-03 ENCOUNTER — Other Ambulatory Visit: Payer: Self-pay | Admitting: Medical Oncology

## 2018-01-03 ENCOUNTER — Inpatient Hospital Stay: Payer: Medicare Other | Attending: Internal Medicine

## 2018-01-03 VITALS — BP 110/60 | HR 85 | Temp 97.6°F | Resp 20

## 2018-01-03 DIAGNOSIS — D469 Myelodysplastic syndrome, unspecified: Secondary | ICD-10-CM | POA: Diagnosis not present

## 2018-01-03 DIAGNOSIS — D649 Anemia, unspecified: Secondary | ICD-10-CM | POA: Insufficient documentation

## 2018-01-03 DIAGNOSIS — D539 Nutritional anemia, unspecified: Secondary | ICD-10-CM

## 2018-01-03 LAB — CBC WITH DIFFERENTIAL (CANCER CENTER ONLY)
BASOS ABS: 0 10*3/uL (ref 0.0–0.1)
Basophils Relative: 1 %
Eosinophils Absolute: 0 10*3/uL (ref 0.0–0.5)
Eosinophils Relative: 1 %
HEMATOCRIT: 19 % — AB (ref 38.4–49.9)
Hemoglobin: 6.2 g/dL — CL (ref 13.0–17.1)
LYMPHS PCT: 58 %
Lymphs Abs: 0.9 10*3/uL (ref 0.9–3.3)
MCH: 33.3 pg (ref 27.2–33.4)
MCHC: 32.8 g/dL (ref 32.0–36.0)
MCV: 101.6 fL — AB (ref 79.3–98.0)
Monocytes Absolute: 0.3 10*3/uL (ref 0.1–0.9)
Monocytes Relative: 21 %
NEUTROS ABS: 0.3 10*3/uL — AB (ref 1.5–6.5)
NEUTROS PCT: 19 %
Platelet Count: 67 10*3/uL — ABNORMAL LOW (ref 140–400)
RBC: 1.87 MIL/uL — AB (ref 4.20–5.82)
RDW: 24.9 % — AB (ref 11.0–14.6)
WBC: 1.6 10*3/uL — AB (ref 4.0–10.3)

## 2018-01-03 LAB — CMP (CANCER CENTER ONLY)
ALT: 12 U/L (ref 0–55)
ANION GAP: 5 (ref 3–11)
AST: 15 U/L (ref 5–34)
Albumin: 3.7 g/dL (ref 3.5–5.0)
Alkaline Phosphatase: 53 U/L (ref 40–150)
BUN: 14 mg/dL (ref 7–26)
CHLORIDE: 109 mmol/L (ref 98–109)
CO2: 24 mmol/L (ref 22–29)
Calcium: 8.9 mg/dL (ref 8.4–10.4)
Creatinine: 0.86 mg/dL (ref 0.70–1.30)
Glucose, Bld: 86 mg/dL (ref 70–140)
POTASSIUM: 4.9 mmol/L (ref 3.5–5.1)
Sodium: 138 mmol/L (ref 136–145)
Total Bilirubin: 0.8 mg/dL (ref 0.2–1.2)
Total Protein: 7.4 g/dL (ref 6.4–8.3)

## 2018-01-03 LAB — PREPARE RBC (CROSSMATCH)

## 2018-01-03 MED ORDER — SODIUM CHLORIDE 0.9 % IV SOLN: 250.0000 mL | Freq: Once | INTRAVENOUS | Status: DC

## 2018-01-03 MED ORDER — ACETAMINOPHEN 325 MG PO TABS
650.0000 mg | ORAL_TABLET | Freq: Once | ORAL | Status: AC
  Administered 2018-01-03: 650 mg via ORAL
  Filled 2018-01-03: qty 2

## 2018-01-03 MED ORDER — DIPHENHYDRAMINE HCL 25 MG PO CAPS
25.0000 mg | ORAL_CAPSULE | Freq: Once | ORAL | Status: AC
  Administered 2018-01-03: 25 mg via ORAL
  Filled 2018-01-03: qty 1

## 2018-01-03 MED ORDER — DARBEPOETIN ALFA 300 MCG/0.6ML IJ SOSY
300.0000 ug | PREFILLED_SYRINGE | Freq: Once | INTRAMUSCULAR | Status: AC
  Administered 2018-01-03: 300 ug via SUBCUTANEOUS

## 2018-01-03 MED ORDER — DARBEPOETIN ALFA 300 MCG/0.6ML IJ SOSY
PREFILLED_SYRINGE | INTRAMUSCULAR | Status: AC
  Filled 2018-01-03: qty 0.6

## 2018-01-03 NOTE — Discharge Instructions (Signed)
Blood Transfusion, Adult, Care After This sheet gives you information about how to care for yourself after your procedure. Your health care provider may also give you more specific instructions. If you have problems or questions, contact your health care provider. What can I expect after the procedure? After your procedure, it is common to have:  Bruising and soreness where the IV tube was inserted.  Headache.  Follow these instructions at home:  Take over-the-counter and prescription medicines only as told by your health care provider.  Return to your normal activities as told by your health care provider.  Follow instructions from your health care provider about how to take care of your IV insertion site. Make sure you: ? Wash your hands with soap and water before you change your bandage (dressing). If soap and water are not available, use hand sanitizer. ? Change your dressing as told by your health care provider.  Check your IV insertion site every day for signs of infection. Check for: ? More redness, swelling, or pain. ? More fluid or blood. ? Warmth. ? Pus or a bad smell. Contact a health care provider if:  You have more redness, swelling, or pain around the IV insertion site.  You have more fluid or blood coming from the IV insertion site.  Your IV insertion site feels warm to the touch.  You have pus or a bad smell coming from the IV insertion site.  Your urine turns pink, red, or brown.  You feel weak after doing your normal activities. Get help right away if:  You have signs of a serious allergic or immune system reaction, including: ? Itchiness. ? Hives. ? Trouble breathing. ? Anxiety. ? Chest or lower back pain. ? Fever, flushing, and chills. ? Rapid pulse. ? Rash. ? Diarrhea. ? Vomiting. ? Dark urine. ? Serious headache. ? Dizziness. ? Stiff neck. ? Yellow coloration of the face or the white parts of the eyes (jaundice). This information is not  intended to replace advice given to you by your health care provider. Make sure you discuss any questions you have with your health care provider. Document Released: 10/05/2014 Document Revised: 05/13/2016 Document Reviewed: 03/30/2016 Elsevier Interactive Patient Education  2018 [employer]  

## 2018-01-03 NOTE — Patient Instructions (Signed)

## 2018-01-03 NOTE — Progress Notes (Signed)
PATIENT CARE CENTER NOTE  Diagnosis: Iron Deficiency Anemia    Provider: Curt Bears Md   Procedure: 2 units PRBC's    Note: Patient received transfusion of 2 units of blood per order. Vitals remained stable throughout transfusion and patient had no adverse reaction. Discharge instructions given to patient and family. Patient alert, oriented and ambulatory at discharge.

## 2018-01-04 LAB — BPAM RBC
Blood Product Expiration Date: 201904262359
Blood Product Expiration Date: 201904262359
ISSUE DATE / TIME: 201904081133
ISSUE DATE / TIME: 201904081133
UNIT TYPE AND RH: 6200
Unit Type and Rh: 6200

## 2018-01-04 LAB — TYPE AND SCREEN
ABO/RH(D): A POS
Antibody Screen: NEGATIVE
UNIT DIVISION: 0
UNIT DIVISION: 0

## 2018-01-11 ENCOUNTER — Other Ambulatory Visit: Payer: Self-pay | Admitting: Medical Oncology

## 2018-01-11 ENCOUNTER — Other Ambulatory Visit: Payer: Self-pay | Admitting: *Deleted

## 2018-01-11 ENCOUNTER — Telehealth: Payer: Self-pay | Admitting: Medical Oncology

## 2018-01-11 ENCOUNTER — Telehealth: Payer: Self-pay | Admitting: *Deleted

## 2018-01-11 ENCOUNTER — Inpatient Hospital Stay: Payer: Medicare Other

## 2018-01-11 ENCOUNTER — Telehealth: Payer: Self-pay | Admitting: Internal Medicine

## 2018-01-11 DIAGNOSIS — D649 Anemia, unspecified: Secondary | ICD-10-CM | POA: Diagnosis not present

## 2018-01-11 DIAGNOSIS — D469 Myelodysplastic syndrome, unspecified: Secondary | ICD-10-CM | POA: Diagnosis not present

## 2018-01-11 LAB — CBC WITH DIFFERENTIAL (CANCER CENTER ONLY)
BASOS PCT: 0 %
Basophils Absolute: 0 10*3/uL (ref 0.0–0.1)
EOS ABS: 0 10*3/uL (ref 0.0–0.5)
EOS PCT: 0 %
HCT: 23.7 % — ABNORMAL LOW (ref 38.4–49.9)
HEMOGLOBIN: 7.5 g/dL — AB (ref 13.0–17.1)
LYMPHS ABS: 0.7 10*3/uL — AB (ref 0.9–3.3)
Lymphocytes Relative: 27 %
MCH: 31.3 pg (ref 27.2–33.4)
MCHC: 31.6 g/dL — AB (ref 32.0–36.0)
MCV: 98.8 fL — ABNORMAL HIGH (ref 79.3–98.0)
MONO ABS: 0.7 10*3/uL (ref 0.1–0.9)
MONOS PCT: 25 %
NEUTROS PCT: 48 %
Neutro Abs: 1.3 10*3/uL — ABNORMAL LOW (ref 1.5–6.5)
PLATELETS: 68 10*3/uL — AB (ref 140–400)
RBC: 2.4 MIL/uL — ABNORMAL LOW (ref 4.20–5.82)
RDW: 22.7 % — AB (ref 11.0–14.6)
Smear Review: 5
WBC Count: 2.7 10*3/uL — ABNORMAL LOW (ref 4.0–10.3)

## 2018-01-11 LAB — PREPARE RBC (CROSSMATCH)

## 2018-01-11 LAB — SAMPLE TO BLOOD BANK

## 2018-01-11 MED ORDER — SODIUM CHLORIDE 0.9 % IV SOLN
250.0000 mL | Freq: Once | INTRAVENOUS | Status: AC
  Administered 2018-01-11: 250 mL via INTRAVENOUS

## 2018-01-11 MED ORDER — DIPHENHYDRAMINE HCL 25 MG PO CAPS
ORAL_CAPSULE | ORAL | Status: AC
  Filled 2018-01-11: qty 1

## 2018-01-11 MED ORDER — SODIUM CHLORIDE 0.9% FLUSH
10.0000 mL | INTRAVENOUS | Status: DC | PRN
  Filled 2018-01-11: qty 10

## 2018-01-11 MED ORDER — ACETAMINOPHEN 325 MG PO TABS
ORAL_TABLET | ORAL | Status: AC
  Filled 2018-01-11: qty 2

## 2018-01-11 MED ORDER — DIPHENHYDRAMINE HCL 25 MG PO CAPS
25.0000 mg | ORAL_CAPSULE | Freq: Once | ORAL | Status: AC
  Administered 2018-01-11: 25 mg via ORAL

## 2018-01-11 MED ORDER — ACETAMINOPHEN 325 MG PO TABS
650.0000 mg | ORAL_TABLET | Freq: Once | ORAL | Status: AC
  Administered 2018-01-11: 650 mg via ORAL

## 2018-01-11 NOTE — Telephone Encounter (Signed)
Wife reports pt sleeping all the time, sob, slightly confused. Requests lab appt to check hgb. Appt made . Schedule request sent for blood transfusion.

## 2018-01-11 NOTE — Telephone Encounter (Signed)
Scheduled appt per 4/16 sch msg - spoke with patient regarding appts.

## 2018-01-11 NOTE — Telephone Encounter (Signed)
Spoke with Pamala Hurry, instructed her to bring pt in at 1:45 today, we will transfuse both units today if time allows. She voiced understanding.

## 2018-01-11 NOTE — Patient Instructions (Signed)

## 2018-01-12 LAB — BPAM RBC
Blood Product Expiration Date: 201905122359
Blood Product Expiration Date: 201905122359
Blood Product Expiration Date: 201905122359
ISSUE DATE / TIME: 201904161434
ISSUE DATE / TIME: 201904161434
ISSUE DATE / TIME: 201904170100
Unit Type and Rh: 6200
Unit Type and Rh: 6200
Unit Type and Rh: 6200

## 2018-01-12 LAB — TYPE AND SCREEN
ABO/RH(D): A POS
Antibody Screen: NEGATIVE
UNIT DIVISION: 0
Unit division: 0
Unit division: 0

## 2018-01-24 ENCOUNTER — Inpatient Hospital Stay: Payer: Medicare Other

## 2018-01-24 ENCOUNTER — Other Ambulatory Visit: Payer: Self-pay | Admitting: *Deleted

## 2018-01-24 VITALS — BP 119/62 | HR 73 | Temp 97.5°F | Resp 18

## 2018-01-24 DIAGNOSIS — D469 Myelodysplastic syndrome, unspecified: Secondary | ICD-10-CM

## 2018-01-24 DIAGNOSIS — D539 Nutritional anemia, unspecified: Secondary | ICD-10-CM

## 2018-01-24 LAB — CBC WITH DIFFERENTIAL (CANCER CENTER ONLY)
BASOS ABS: 0 10*3/uL (ref 0.0–0.1)
Basophils Relative: 1 %
EOS ABS: 0 10*3/uL (ref 0.0–0.5)
EOS PCT: 1 %
HCT: 28.6 % — ABNORMAL LOW (ref 38.4–49.9)
Hemoglobin: 9.2 g/dL — ABNORMAL LOW (ref 13.0–17.1)
LYMPHS ABS: 0.9 10*3/uL (ref 0.9–3.3)
LYMPHS PCT: 45 %
MCH: 31 pg (ref 27.2–33.4)
MCHC: 32.2 g/dL (ref 32.0–36.0)
MCV: 96.3 fL (ref 79.3–98.0)
Monocytes Absolute: 0.5 10*3/uL (ref 0.1–0.9)
Monocytes Relative: 25 %
NEUTROS PCT: 28 %
Neutro Abs: 0.6 10*3/uL — ABNORMAL LOW (ref 1.5–6.5)
PLATELETS: 93 10*3/uL — AB (ref 140–400)
RBC: 2.97 MIL/uL — AB (ref 4.20–5.82)
RDW: 21.7 % — ABNORMAL HIGH (ref 11.0–14.6)
WBC Count: 2.1 10*3/uL — ABNORMAL LOW (ref 4.0–10.3)

## 2018-01-24 LAB — SAMPLE TO BLOOD BANK

## 2018-01-24 LAB — CMP (CANCER CENTER ONLY)
ALT: 12 U/L (ref 0–55)
ANION GAP: 6 (ref 3–11)
AST: 18 U/L (ref 5–34)
Albumin: 3.9 g/dL (ref 3.5–5.0)
Alkaline Phosphatase: 60 U/L (ref 40–150)
BUN: 14 mg/dL (ref 7–26)
CHLORIDE: 102 mmol/L (ref 98–109)
CO2: 26 mmol/L (ref 22–29)
CREATININE: 0.85 mg/dL (ref 0.70–1.30)
Calcium: 9 mg/dL (ref 8.4–10.4)
Glucose, Bld: 80 mg/dL (ref 70–140)
POTASSIUM: 4.1 mmol/L (ref 3.5–5.1)
SODIUM: 134 mmol/L — AB (ref 136–145)
Total Bilirubin: 0.8 mg/dL (ref 0.2–1.2)
Total Protein: 7.6 g/dL (ref 6.4–8.3)

## 2018-01-24 LAB — PREPARE RBC (CROSSMATCH)

## 2018-01-24 MED ORDER — DARBEPOETIN ALFA 300 MCG/0.6ML IJ SOSY
PREFILLED_SYRINGE | INTRAMUSCULAR | Status: AC
  Filled 2018-01-24: qty 0.6

## 2018-01-24 MED ORDER — DARBEPOETIN ALFA 300 MCG/0.6ML IJ SOSY
300.0000 ug | PREFILLED_SYRINGE | Freq: Once | INTRAMUSCULAR | Status: AC
  Administered 2018-01-24: 300 ug via SUBCUTANEOUS

## 2018-01-24 NOTE — Patient Instructions (Signed)

## 2018-02-01 ENCOUNTER — Other Ambulatory Visit: Payer: Self-pay | Admitting: Internal Medicine

## 2018-02-01 DIAGNOSIS — R4702 Dysphasia: Secondary | ICD-10-CM

## 2018-02-01 DIAGNOSIS — R1310 Dysphagia, unspecified: Secondary | ICD-10-CM

## 2018-02-03 ENCOUNTER — Other Ambulatory Visit: Payer: Medicare Other

## 2018-02-04 ENCOUNTER — Ambulatory Visit
Admission: RE | Admit: 2018-02-04 | Discharge: 2018-02-04 | Disposition: A | Discharge status: Home / Self Care | Payer: Medicare Other | Source: Ambulatory Visit | Attending: Internal Medicine | Admitting: Internal Medicine

## 2018-02-04 DIAGNOSIS — R1310 Dysphagia, unspecified: Secondary | ICD-10-CM

## 2018-02-14 ENCOUNTER — Other Ambulatory Visit: Payer: Self-pay | Admitting: Medical Oncology

## 2018-02-14 ENCOUNTER — Inpatient Hospital Stay: Payer: Medicare Other | Attending: Internal Medicine | Admitting: Internal Medicine

## 2018-02-14 ENCOUNTER — Inpatient Hospital Stay: Payer: Medicare Other

## 2018-02-14 ENCOUNTER — Telehealth: Payer: Self-pay | Admitting: Internal Medicine

## 2018-02-14 ENCOUNTER — Encounter: Payer: Self-pay | Admitting: Internal Medicine

## 2018-02-14 VITALS — BP 104/63 | HR 84 | Temp 98.0°F | Resp 18 | Ht 68.0 in | Wt 154.9 lb

## 2018-02-14 VITALS — BP 109/58 | HR 83 | Temp 98.3°F | Resp 16

## 2018-02-14 DIAGNOSIS — Z862 Personal history of diseases of the blood and blood-forming organs and certain disorders involving the immune mechanism: Secondary | ICD-10-CM | POA: Insufficient documentation

## 2018-02-14 DIAGNOSIS — D709 Neutropenia, unspecified: Secondary | ICD-10-CM

## 2018-02-14 DIAGNOSIS — D469 Myelodysplastic syndrome, unspecified: Secondary | ICD-10-CM

## 2018-02-14 DIAGNOSIS — Z66 Do not resuscitate: Secondary | ICD-10-CM | POA: Insufficient documentation

## 2018-02-14 DIAGNOSIS — Z79899 Other long term (current) drug therapy: Secondary | ICD-10-CM | POA: Diagnosis not present

## 2018-02-14 DIAGNOSIS — D649 Anemia, unspecified: Secondary | ICD-10-CM

## 2018-02-14 DIAGNOSIS — D4620 Refractory anemia with excess of blasts, unspecified: Secondary | ICD-10-CM | POA: Diagnosis present

## 2018-02-14 DIAGNOSIS — D701 Agranulocytosis secondary to cancer chemotherapy: Secondary | ICD-10-CM | POA: Diagnosis not present

## 2018-02-14 DIAGNOSIS — Z9221 Personal history of antineoplastic chemotherapy: Secondary | ICD-10-CM | POA: Insufficient documentation

## 2018-02-14 DIAGNOSIS — R530 Neoplastic (malignant) related fatigue: Secondary | ICD-10-CM | POA: Diagnosis not present

## 2018-02-14 DIAGNOSIS — D539 Nutritional anemia, unspecified: Secondary | ICD-10-CM

## 2018-02-14 LAB — PREPARE RBC (CROSSMATCH)

## 2018-02-14 LAB — FERRITIN: Ferritin: 1484 ng/mL — ABNORMAL HIGH (ref 22–316)

## 2018-02-14 LAB — IRON AND TIBC
IRON: 135 ug/dL (ref 42–163)
SATURATION RATIOS: 99 % (ref 42–163)
TIBC: 136 ug/dL — AB (ref 202–409)
UIBC: 2 ug/dL

## 2018-02-14 MED ORDER — ACETAMINOPHEN 325 MG PO TABS
ORAL_TABLET | ORAL | Status: AC
  Filled 2018-02-14: qty 2

## 2018-02-14 MED ORDER — DARBEPOETIN ALFA 300 MCG/0.6ML IJ SOSY
PREFILLED_SYRINGE | INTRAMUSCULAR | Status: AC
  Filled 2018-02-14: qty 0.6

## 2018-02-14 MED ORDER — SODIUM CHLORIDE 0.9 % IV SOLN
250.0000 mL | Freq: Once | INTRAVENOUS | Status: AC
  Administered 2018-02-14: 250 mL via INTRAVENOUS

## 2018-02-14 MED ORDER — SODIUM CHLORIDE 0.9 % IV SOLN: 250.0000 mL | Freq: Once | INTRAVENOUS | Status: DC

## 2018-02-14 MED ORDER — DIPHENHYDRAMINE HCL 25 MG PO CAPS
ORAL_CAPSULE | ORAL | Status: AC
  Filled 2018-02-14: qty 1

## 2018-02-14 MED ORDER — DIPHENHYDRAMINE HCL 25 MG PO CAPS: 25.0000 mg | ORAL_CAPSULE | Freq: Once | ORAL | Status: DC

## 2018-02-14 MED ORDER — ACETAMINOPHEN 325 MG PO TABS
650.0000 mg | ORAL_TABLET | Freq: Once | ORAL | Status: AC
  Administered 2018-02-14: 650 mg via ORAL

## 2018-02-14 MED ORDER — DARBEPOETIN ALFA 300 MCG/0.6ML IJ SOSY
300.0000 ug | PREFILLED_SYRINGE | Freq: Once | INTRAMUSCULAR | Status: AC
  Administered 2018-02-14: 300 ug via SUBCUTANEOUS

## 2018-02-14 MED ORDER — ACETAMINOPHEN 325 MG PO TABS: 650.0000 mg | ORAL_TABLET | Freq: Once | ORAL | Status: DC

## 2018-02-14 MED ORDER — DIPHENHYDRAMINE HCL 25 MG PO CAPS
25.0000 mg | ORAL_CAPSULE | Freq: Once | ORAL | Status: AC
  Administered 2018-02-14: 25 mg via ORAL

## 2018-02-14 MED ORDER — TBO-FILGRASTIM 300 MCG/0.5ML ~~LOC~~ SOSY
300.0000 ug | PREFILLED_SYRINGE | Freq: Once | SUBCUTANEOUS | Status: AC
  Administered 2018-02-14: 300 ug via SUBCUTANEOUS

## 2018-02-14 NOTE — Telephone Encounter (Signed)
Scheduled appt per 5/20 sch message - pt still with MM -

## 2018-02-14 NOTE — Telephone Encounter (Signed)
Scheduled appt per 5/20 los - Gave patient AVS and calender per los.  

## 2018-02-14 NOTE — Patient Instructions (Signed)

## 2018-02-14 NOTE — Progress Notes (Signed)
[place] [phone]   [phone]  OFFICE PROGRESS NOTE  Lorene Dy, MD [address] [place] [address] [place] [address]  DIAGNOSIS: 1)  myelodysplastic syndrome consistent with refractory anemia with excess blast diagnosed in December 2016 2)  History of ITP  PRIOR THERAPY: Vidaza 75 mg/m2 IV for 5 days every 4 weeks. Status post 3 cycles.  CURRENT THERAPY:  1) Supportive care with Aranesp 300 g subcutaneously every 3 weeks in addition to Granix 300 mcg subcutaneously for neutropenia as needed.  INTERVAL HISTORY: Dean Stanley 84 y.o. male returns to the clinic today for follow-up visit accompanied by his wife.  The patient is feeling fine today with no specific complaints except for the persistent fatigue.  He has recent fall with bruises on the right side of the face above his eye as well as the left shoulder.  He denied having any weight loss or night sweats.  He has no nausea, vomiting, diarrhea or constipation.  He denied having any fever or chills.  He is here today for evaluation and repeat blood work.  MEDICAL HISTORY: Past Medical History:  Diagnosis Date  . Anemia   . Cancer (HCC)    myelodysplastic syndrome  . Complication of anesthesia    Reaction to anesthesia " things went haywire around me"  . Do not resuscitate 11/04/2015  . Hearing impairment    wears hearing left ear  . History of ITP 1960  . Hypothyroidism   . Meniere's syndrome 1980s  . Port catheter in place 12/23/2015    ALLERGIES:  is allergic to percocet [oxycodone-acetaminophen]; hydrocodone; and oxycodone.  MEDICATIONS:  Current Outpatient Medications  Medication Sig Dispense Refill  . calcipotriene (DOVONOX) 0.005 % ointment Apply topically 2 (two) times daily.    . cephALEXin (KEFLEX) 500 MG capsule Take 500 mg by mouth 4 (four) times daily.    . Darbepoetin Alfa (ARANESP) 300 MCG/0.6ML SOSY injection Inject 300 mcg into the skin every 30 (thirty)  days. As needed based on blood counts    . desmopressin (DDAVP) 0.2 MG tablet Take 0.2 mg by mouth daily.    . ferrous sulfate 325 (65 FE) MG tablet Take 325 mg by mouth daily with breakfast.    . finasteride (PROPECIA) 1 MG tablet Take 1 mg by mouth daily with breakfast.     . Flaxseed, Linseed, (FLAX SEED OIL PO) Take 1 capsule by mouth daily.    Marland Kitchen levothyroxine (SYNTHROID, LEVOTHROID) 200 MCG tablet Take 200 mcg by mouth at bedtime.     . Lutein 40 MG CAPS Take 40 mg by mouth daily with breakfast.     . Multiple Vitamins-Minerals (CENTRUM ADULTS PO) Take 1 tablet by mouth daily.     Marland Kitchen omeprazole (PRILOSEC) 20 MG capsule Take 20 mg by mouth daily.    Marland Kitchen triamcinolone (KENALOG) 0.025 % cream Apply 1 application topically 2 (two) times daily as needed (for inflammation).     . Tbo-Filgrastim (GRANIX) 300 MCG/0.5ML SOSY injection Inject 300 mcg into the skin every 30 (thirty) days.     No current facility-administered medications for this visit.     SURGICAL HISTORY:  Past Surgical History:  Procedure Laterality Date  . cataract Right 2011  . CIRCUMCISION    . COLONOSCOPY    . cortisone injections to back x 6    . ORIF ORBITAL FRACTURE Left 2000   2 fractures orbital platform    REVIEW OF SYSTEMS:  A  comprehensive review of systems was negative except for: Constitutional: positive for fatigue   PHYSICAL EXAMINATION: General appearance: alert, cooperative, fatigued and no distress Head: Normocephalic, without obvious abnormality, atraumatic Neck: no adenopathy, no JVD, supple, symmetrical, trachea midline and thyroid not enlarged, symmetric, no tenderness/mass/nodules Lymph nodes: Cervical, supraclavicular, and axillary nodes normal. Resp: clear to auscultation bilaterally Back: symmetric, no curvature. ROM normal. No CVA tenderness. Cardio: regular rate and rhythm, S1, S2 normal, no murmur, click, rub or gallop GI: soft, non-tender; bowel sounds normal; no masses,  no  organomegaly Extremities: extremities normal, atraumatic, no cyanosis or edema  ECOG PERFORMANCE STATUS: 1 - Symptomatic but completely ambulatory  Blood pressure 104/63, pulse 84, temperature 98 F (36.7 C), temperature source Axillary, resp. rate 18, height 5\' 8"  (1.727 m), weight 154 lb 14.4 oz (70.3 kg), SpO2 98 %.  LABORATORY DATA: Lab Results  Component Value Date   WBC 2.0 (L) 02/14/2018   HGB 7.3 (L) 02/14/2018   HCT 22.5 (L) 02/14/2018   MCV 94.5 02/14/2018   PLT 64 (L) 02/14/2018      Chemistry      Component Value Date/Time   NA 134 (L) 01/24/2018 0953   NA 134 (L) 09/29/2017 1134   K 4.1 01/24/2018 0953   K 4.1 09/29/2017 1134   CL 102 01/24/2018 0953   CO2 26 01/24/2018 0953   CO2 26 09/29/2017 1134   BUN 14 01/24/2018 0953   BUN 14.8 09/29/2017 1134   CREATININE 0.85 01/24/2018 0953   CREATININE 0.8 09/29/2017 1134      Component Value Date/Time   CALCIUM 9.0 01/24/2018 0953   CALCIUM 8.8 09/29/2017 1134   ALKPHOS 60 01/24/2018 0953   ALKPHOS 41 09/29/2017 1134   AST 18 01/24/2018 0953   AST 13 09/29/2017 1134   ALT 12 01/24/2018 0953   ALT 12 09/29/2017 1134   BILITOT 0.8 01/24/2018 0953   BILITOT 1.37 (H) 09/29/2017 1134       RADIOGRAPHIC STUDIES: Dg Esophagus  Result Date: 02/04/2018 CLINICAL DATA:  Dysphagia with globus sensation in the neck and upper chest. EXAM: ESOPHOGRAM / BARIUM SWALLOW / BARIUM TABLET STUDY TECHNIQUE: Combined double contrast and single contrast examination performed using effervescent crystals, thick barium liquid, and thin barium liquid. The patient was observed with fluoroscopy swallowing a 13 mm barium sulphate tablet. FLUOROSCOPY TIME:  Fluoroscopy Time:  2 minutes 36 seconds Radiation Exposure Index (if provided by the fluoroscopic device): Fifty-three mGy Number of Acquired Spot Images: 9 COMPARISON:  None. FINDINGS: Normal oral phase of swallowing. No laryngeal penetration or tracheobronchial aspiration. Minimal  barium retention in the vallecula and piriform sinuses. No pharyngeal mass, stricture or diverticulum. No evidence of cricopharyngeus muscle dysfunction. Small sliding hiatal hernia. There is spontaneous moderate gastroesophageal reflux to the level of the upper thoracic esophagus. There is moderate esophageal dysmotility with proximal escape of the barium bolus in the upper thoracic esophagus and with intermittent scattered mild tertiary contractions throughout the thoracic esophagus. There is granularity of the mid to lower thoracic esophagus, compatible with reflux esophagitis. No evidence of esophageal mass, significant stricture or ulcer. The swallowed barium tablet traversed the esophagus into the stomach without delay. IMPRESSION: 1. No evidence of laryngeal penetration or tracheobronchial aspiration. Minimal barium retention in the hypopharynx. 2. Small sliding hiatal hernia. Moderate spontaneous gastroesophageal reflux. 3. Moderate esophageal dysmotility, with a presbyesophagus pattern. 4. Reflux esophagitis in the mid to lower thoracic esophagus. No evidence of esophageal mass, significant stricture or ulcer. Electronically Signed  By: Ilona Sorrel M.D.   On: 02/04/2018 11:24    ASSESSMENT AND PLAN:   This is a very pleasant 84 years old white male with myelodysplastic syndrome consistent with refractory anemia with excess blasts status post systemic chemotherapy with Vidaza 3 cycles and has been observation since that time. The patient is currently on Aranesp injection every 3 weeks and tolerating it well. CBC today showed significant anemia.  We will arrange for the patient to receive 2 units of PRBCs transfusion this week. He will continue on his current treatment with Aranesp every 3 weeks. For the neutropenia, I will arrange for the patient to receive a dose of Granix 300 mcg subcutaneously today. I discussed with him consideration of treatment with Vidaza again but the patient declined  this option. I will see him back for follow-up visit in 3 months for evaluation with repeat CBC, iron study and ferritin. He was advised to call if he has any concerning symptoms in the interval. The patient voices understanding of current disease status and treatment options and is in agreement with the current care plan. All questions were answered. The patient knows to call the clinic with any problems, questions or concerns. We can certainly see the patient much sooner if necessary. I spent 10 minutes counseling the patient face to face. The total time spent in the appointment was 15 minutes.  Disclaimer: This note was dictated with voice recognition software. Similar sounding words can inadvertently be transcribed and may not be corrected upon review.

## 2018-02-15 LAB — BPAM RBC
Blood Product Expiration Date: 201906052359
Blood Product Expiration Date: 201906052359
ISSUE DATE / TIME: 201905201239
ISSUE DATE / TIME: 201905201442
UNIT TYPE AND RH: 6200
Unit Type and Rh: 6200

## 2018-02-15 LAB — TYPE AND SCREEN
ABO/RH(D): A POS
Antibody Screen: NEGATIVE
UNIT DIVISION: 0
Unit division: 0

## 2018-02-23 LAB — CBC WITH DIFFERENTIAL (CANCER CENTER ONLY)
BAND NEUTROPHILS: 7 %
BASOS ABS: 0 10*3/uL (ref 0.0–0.1)
BLASTS: 10 %
Basophils Relative: 2 %
EOS PCT: 0 %
Eosinophils Absolute: 0 10*3/uL (ref 0.0–0.5)
HCT: 22.5 % — ABNORMAL LOW (ref 38.4–49.9)
Hemoglobin: 7.3 g/dL — ABNORMAL LOW (ref 13.0–17.1)
LYMPHS ABS: 1 10*3/uL (ref 0.9–3.3)
LYMPHS PCT: 50 %
MCH: 30.7 pg (ref 27.2–33.4)
MCHC: 32.4 g/dL (ref 32.0–36.0)
MCV: 94.5 fL (ref 79.3–98.0)
METAMYELOCYTES PCT: 2 %
MYELOCYTES: 3 %
Monocytes Absolute: 0.1 10*3/uL (ref 0.1–0.9)
Monocytes Relative: 7 %
NEUTROS PCT: 19 %
Neutro Abs: 0.6 10*3/uL — ABNORMAL LOW (ref 1.5–6.5)
PLATELETS: 64 10*3/uL — AB (ref 140–400)
RBC: 2.38 MIL/uL — ABNORMAL LOW (ref 4.20–5.82)
RDW: 22.7 % — ABNORMAL HIGH (ref 11.0–14.6)
Smear Review: 2
WBC Count: 2 10*3/uL — ABNORMAL LOW (ref 4.0–10.3)

## 2018-03-07 ENCOUNTER — Inpatient Hospital Stay: Payer: Medicare Other | Attending: Internal Medicine

## 2018-03-07 ENCOUNTER — Other Ambulatory Visit: Payer: Self-pay | Admitting: Medical Oncology

## 2018-03-07 ENCOUNTER — Inpatient Hospital Stay: Payer: Medicare Other

## 2018-03-07 VITALS — BP 115/62 | HR 81 | Temp 98.0°F | Resp 18

## 2018-03-07 DIAGNOSIS — D469 Myelodysplastic syndrome, unspecified: Secondary | ICD-10-CM

## 2018-03-07 DIAGNOSIS — D4620 Refractory anemia with excess of blasts, unspecified: Secondary | ICD-10-CM | POA: Insufficient documentation

## 2018-03-07 DIAGNOSIS — D649 Anemia, unspecified: Secondary | ICD-10-CM

## 2018-03-07 DIAGNOSIS — D539 Nutritional anemia, unspecified: Secondary | ICD-10-CM

## 2018-03-07 LAB — CBC WITH DIFFERENTIAL/PLATELET
BASOS PCT: 2 %
Basophils Absolute: 0 10*3/uL (ref 0.0–0.1)
EOS ABS: 0 10*3/uL (ref 0.0–0.5)
Eosinophils Relative: 1 %
HEMATOCRIT: 20.7 % — AB (ref 38.4–49.9)
Hemoglobin: 6.7 g/dL — CL (ref 13.0–17.1)
LYMPHS ABS: 1.1 10*3/uL (ref 0.9–3.3)
Lymphocytes Relative: 40 %
MCH: 29.8 pg (ref 27.2–33.4)
MCHC: 32.4 g/dL (ref 32.0–36.0)
MCV: 92 fL (ref 79.3–98.0)
MONOS PCT: 27 %
Monocytes Absolute: 0.7 10*3/uL (ref 0.1–0.9)
NEUTROS ABS: 0.8 10*3/uL — AB (ref 1.5–6.5)
Neutrophils Relative %: 30 %
Platelets: 55 10*3/uL — ABNORMAL LOW (ref 140–400)
RBC: 2.25 MIL/uL — AB (ref 4.20–5.82)
RDW: 21 % — ABNORMAL HIGH (ref 11.0–14.6)
WBC: 2.7 10*3/uL — AB (ref 4.0–10.3)

## 2018-03-07 LAB — SAMPLE TO BLOOD BANK

## 2018-03-07 LAB — PREPARE RBC (CROSSMATCH)

## 2018-03-07 MED ORDER — ACETAMINOPHEN 325 MG PO TABS
650.0000 mg | ORAL_TABLET | Freq: Once | ORAL | Status: AC
  Administered 2018-03-07: 650 mg via ORAL

## 2018-03-07 MED ORDER — SODIUM CHLORIDE 0.9 % IV SOLN
250.0000 mL | Freq: Once | INTRAVENOUS | Status: AC
  Administered 2018-03-07: 250 mL via INTRAVENOUS

## 2018-03-07 MED ORDER — ACETAMINOPHEN 325 MG PO TABS
ORAL_TABLET | ORAL | Status: AC
  Filled 2018-03-07: qty 2

## 2018-03-07 MED ORDER — DIPHENHYDRAMINE HCL 25 MG PO CAPS
ORAL_CAPSULE | ORAL | Status: AC
Start: 2018-03-07 — End: ?
  Filled 2018-03-07: qty 1

## 2018-03-07 MED ORDER — DIPHENHYDRAMINE HCL 25 MG PO CAPS
25.0000 mg | ORAL_CAPSULE | Freq: Once | ORAL | Status: AC
  Administered 2018-03-07: 25 mg via ORAL

## 2018-03-07 MED ORDER — DARBEPOETIN ALFA 300 MCG/0.6ML IJ SOSY
300.0000 ug | PREFILLED_SYRINGE | Freq: Once | INTRAMUSCULAR | Status: AC
  Administered 2018-03-07: 300 ug via SUBCUTANEOUS

## 2018-03-07 MED ORDER — DARBEPOETIN ALFA 300 MCG/0.6ML IJ SOSY
PREFILLED_SYRINGE | INTRAMUSCULAR | Status: AC
  Filled 2018-03-07: qty 0.6

## 2018-03-07 NOTE — Patient Instructions (Signed)

## 2018-03-08 ENCOUNTER — Ambulatory Visit
Admission: RE | Admit: 2018-03-08 | Discharge: 2018-03-08 | Disposition: A | Discharge status: Home / Self Care | Payer: Medicare Other | Source: Ambulatory Visit | Attending: Internal Medicine | Admitting: Internal Medicine

## 2018-03-08 ENCOUNTER — Other Ambulatory Visit: Payer: Self-pay | Admitting: Internal Medicine

## 2018-03-08 DIAGNOSIS — R05 Cough: Secondary | ICD-10-CM

## 2018-03-08 DIAGNOSIS — R059 Cough, unspecified: Secondary | ICD-10-CM

## 2018-03-08 LAB — TYPE AND SCREEN
ABO/RH(D): A POS
Antibody Screen: NEGATIVE
Unit division: 0
Unit division: 0

## 2018-03-08 LAB — BPAM RBC
BLOOD PRODUCT EXPIRATION DATE: 201906292359
BLOOD PRODUCT EXPIRATION DATE: 201907012359
ISSUE DATE / TIME: 201906101302
ISSUE DATE / TIME: 201906101302
UNIT TYPE AND RH: 6200
Unit Type and Rh: 6200

## 2018-03-14 ENCOUNTER — Telehealth: Payer: Self-pay | Admitting: Medical Oncology

## 2018-03-14 ENCOUNTER — Inpatient Hospital Stay: Payer: Medicare Other

## 2018-03-14 ENCOUNTER — Other Ambulatory Visit: Payer: Self-pay | Admitting: Medical Oncology

## 2018-03-14 VITALS — BP 89/53 | HR 73 | Temp 97.9°F | Resp 18

## 2018-03-14 DIAGNOSIS — R110 Nausea: Secondary | ICD-10-CM

## 2018-03-14 DIAGNOSIS — D4620 Refractory anemia with excess of blasts, unspecified: Secondary | ICD-10-CM | POA: Diagnosis not present

## 2018-03-14 DIAGNOSIS — D469 Myelodysplastic syndrome, unspecified: Secondary | ICD-10-CM

## 2018-03-14 DIAGNOSIS — D649 Anemia, unspecified: Secondary | ICD-10-CM

## 2018-03-14 DIAGNOSIS — D539 Nutritional anemia, unspecified: Secondary | ICD-10-CM

## 2018-03-14 LAB — CBC WITH DIFFERENTIAL (CANCER CENTER ONLY)
Basophils Absolute: 0 10*3/uL (ref 0.0–0.1)
Basophils Relative: 1 %
EOS PCT: 0 %
Eosinophils Absolute: 0 10*3/uL (ref 0.0–0.5)
HCT: 24.8 % — ABNORMAL LOW (ref 38.4–49.9)
HEMOGLOBIN: 8.1 g/dL — AB (ref 13.0–17.1)
LYMPHS PCT: 20 %
Lymphs Abs: 0.9 10*3/uL (ref 0.9–3.3)
MCH: 29.6 pg (ref 27.2–33.4)
MCHC: 32.7 g/dL (ref 32.0–36.0)
MCV: 90.5 fL (ref 79.3–98.0)
MONOS PCT: 26 %
Monocytes Absolute: 1.1 10*3/uL — ABNORMAL HIGH (ref 0.1–0.9)
Neutro Abs: 2.3 10*3/uL (ref 1.5–6.5)
Neutrophils Relative %: 53 %
PLATELETS: 46 10*3/uL — AB (ref 140–400)
RBC: 2.74 MIL/uL — AB (ref 4.20–5.82)
RDW: 19.1 % — ABNORMAL HIGH (ref 11.0–14.6)
WBC: 4.2 10*3/uL (ref 4.0–10.3)

## 2018-03-14 LAB — TYPE AND SCREEN
ABO/RH(D): A POS
ANTIBODY SCREEN: NEGATIVE

## 2018-03-14 LAB — SAMPLE TO BLOOD BANK

## 2018-03-14 MED ORDER — SODIUM CHLORIDE 0.9% FLUSH
3.0000 mL | INTRAVENOUS | Status: DC | PRN
  Filled 2018-03-14: qty 10

## 2018-03-14 MED ORDER — ONDANSETRON HCL 4 MG/2ML IJ SOLN
8.0000 mg | Freq: Once | INTRAMUSCULAR | Status: AC
  Administered 2018-03-14: 8 mg via INTRAVENOUS

## 2018-03-14 MED ORDER — SODIUM CHLORIDE 0.9 % IV SOLN
Freq: Once | INTRAVENOUS | Status: AC
  Administered 2018-03-14: 13:00:00 via INTRAVENOUS

## 2018-03-14 MED ORDER — SODIUM CHLORIDE 0.9 % IV SOLN: Freq: Once | INTRAVENOUS | Status: DC

## 2018-03-14 MED ORDER — ONDANSETRON HCL 4 MG/2ML IJ SOLN
INTRAMUSCULAR | Status: AC
  Filled 2018-03-14: qty 4

## 2018-03-14 MED ORDER — SODIUM CHLORIDE 0.9 % IV SOLN
Freq: Once | INTRAVENOUS | Status: AC
  Administered 2018-03-14: 14:00:00 via INTRAVENOUS

## 2018-03-14 MED ORDER — SODIUM CHLORIDE 0.9 % IV SOLN: 250.0000 mL | Freq: Once | INTRAVENOUS | Status: AC

## 2018-03-14 NOTE — Patient Instructions (Signed)
Dehydration, Adult Dehydration is when there is not enough fluid or water in your body. This happens when you lose more fluids than you take in. Dehydration can range from mild to very bad. It should be treated right away to keep it from getting very bad. Symptoms of mild dehydration may include:  Thirst.  Dry lips.  Slightly dry mouth.  Dry, warm skin.  Dizziness. Symptoms of moderate dehydration may include:  Very dry mouth.  Muscle cramps.  Dark pee (urine). Pee may be the color of tea.  Your body making less pee.  Your eyes making fewer tears.  Heartbeat that is uneven or faster than normal (palpitations).  Headache.  Light-headedness, especially when you stand up from sitting.  Fainting (syncope). Symptoms of very bad dehydration may include:  Changes in skin, such as: ? Cold and clammy skin. ? Blotchy (mottled) or pale skin. ? Skin that does not quickly return to normal after being lightly pinched and let go (poor skin turgor).  Changes in body fluids, such as: ? Feeling very thirsty. ? Your eyes making fewer tears. ? Not sweating when body temperature is high, such as in hot weather. ? Your body making very little pee.  Changes in vital signs, such as: ? Weak pulse. ? Pulse that is more than 100 beats a minute when you are sitting still. ? Fast breathing. ? Low blood pressure.  Other changes, such as: ? Sunken eyes. ? Cold hands and feet. ? Confusion. ? Lack of energy (lethargy). ? Trouble waking up from sleep. ? Short-term weight loss. ? Unconsciousness. Follow these instructions at home:  If told by your doctor, drink an ORS: ? Make an ORS by using instructions on the package. ? Start by drinking small amounts, about  cup (120 mL) every 5-10 minutes. ? Slowly drink more until you have had the amount that your doctor said to have.  Drink enough clear fluid to keep your pee clear or pale yellow. If you were told to drink an ORS, finish the ORS  first, then start slowly drinking clear fluids. Drink fluids such as: ? Water. Do not drink only water by itself. Doing that can make the salt (sodium) level in your body get too low (hyponatremia). ? Ice chips. ? Fruit juice that you have added water to (diluted). ? Low-calorie sports drinks.  Avoid: ? Alcohol. ? Drinks that have a lot of sugar. These include high-calorie sports drinks, fruit juice that does not have water added, and soda. ? Caffeine. ? Foods that are greasy or have a lot of fat or sugar.  Take over-the-counter and prescription medicines only as told by your doctor.  Do not take salt tablets. Doing that can make the salt level in your body get too high (hypernatremia).  Eat foods that have minerals (electrolytes). Examples include bananas, oranges, potatoes, tomatoes, and spinach.  Keep all follow-up visits as told by your doctor. This is important. Contact a doctor if:  You have belly (abdominal) pain that: ? Gets worse. ? Stays in one area (localizes).  You have a rash.  You have a stiff neck.  You get angry or annoyed more easily than normal (irritability).  You are more sleepy than normal.  You have a harder time waking up than normal.  You feel: ? Weak. ? Dizzy. ? Very thirsty.  You have peed (urinated) only a small amount of very dark pee during 6-8 hours. Get help right away if:  You have symptoms of   very bad dehydration.  You cannot drink fluids without throwing up (vomiting).  Your symptoms get worse with treatment.  You have a fever.  You have a very bad headache.  You are throwing up or having watery poop (diarrhea) and it: ? Gets worse. ? Does not go away.  You have blood or something green (bile) in your throw-up.  You have blood in your poop (stool). This may cause poop to look black and tarry.  You have not peed in 6-8 hours.  You pass out (faint).  Your heart rate when you are sitting still is more than 100 beats a  minute.  You have trouble breathing. This information is not intended to replace advice given to you by your health care provider. Make sure you discuss any questions you have with your health care provider. Document Released: 07/11/2009 Document Revised: 04/03/2016 Document Reviewed: 11/08/2015 Elsevier Interactive Patient Education  2018 [employer]vier Inc[employer]  

## 2018-03-14 NOTE — Telephone Encounter (Signed)
Wife concerned that pt cannot get a blood transfusion. She is under the impression that he received  blood when his hgb was less than 10. She also said pt being treated for pneumonia and on augmentin . I told Drue Dun, RN to tell pt and wife that blood transfusion parameters are to transfuse for hgb less than 8 .

## 2018-03-14 NOTE — Progress Notes (Signed)
Dr. Julien Nordmann made aware of BP 83/46 initial. 527mL NS ordered. Post IVF BP 91/46. Additional 547mL NS ordered. Post BP 89/53. MD aware. Pt ok to d/c. Pt denied dizziness or increase in level of fatigue. Pt encouraged to push fluids and nutrition orally, and to f/u with PCP regarding hypotension and nausea. Pt on augmentin for pneumonia. Potential cause of pt's nausea. Treated with 8mg  zofran IV per MD at beginning of tx. Explained to pt that no blood today per MD because Hgb not < 8.0. Pt and wife verbalized understanding.

## 2018-03-14 NOTE — Telephone Encounter (Signed)
Very weak . He cannot move from bed to a chair. Thinks he needs blood. Appt given for lab. Orders entered.

## 2018-03-21 ENCOUNTER — Ambulatory Visit: Payer: Medicare Other | Admitting: Speech Pathology

## 2018-03-28 ENCOUNTER — Inpatient Hospital Stay: Payer: Medicare Other | Attending: Internal Medicine

## 2018-03-28 ENCOUNTER — Inpatient Hospital Stay: Payer: Medicare Other

## 2018-03-28 ENCOUNTER — Other Ambulatory Visit: Payer: Self-pay | Admitting: *Deleted

## 2018-03-28 VITALS — BP 107/60 | HR 83 | Temp 97.3°F | Resp 16

## 2018-03-28 DIAGNOSIS — D469 Myelodysplastic syndrome, unspecified: Secondary | ICD-10-CM

## 2018-03-28 DIAGNOSIS — D539 Nutritional anemia, unspecified: Secondary | ICD-10-CM

## 2018-03-28 LAB — CBC WITH DIFFERENTIAL/PLATELET
BASOS ABS: 0.1 10*3/uL (ref 0.0–0.1)
Basophils Relative: 4 %
Eosinophils Absolute: 0 10*3/uL (ref 0.0–0.5)
Eosinophils Relative: 1 %
HCT: 18.9 % — ABNORMAL LOW (ref 38.4–49.9)
HEMOGLOBIN: 6.2 g/dL — AB (ref 13.0–17.1)
LYMPHS ABS: 1 10*3/uL (ref 0.9–3.3)
LYMPHS PCT: 48 %
MCH: 29.2 pg (ref 27.2–33.4)
MCHC: 32.8 g/dL (ref 32.0–36.0)
MCV: 89.2 fL (ref 79.3–98.0)
MONOS PCT: 24 %
Monocytes Absolute: 0.5 10*3/uL (ref 0.1–0.9)
NEUTROS ABS: 0.5 10*3/uL — AB (ref 1.5–6.5)
NRBC: 1 /100{WBCs} — AB
Neutrophils Relative %: 23 %
Platelets: 58 10*3/uL — ABNORMAL LOW (ref 140–400)
RBC: 2.12 MIL/uL — ABNORMAL LOW (ref 4.20–5.82)
RDW: 19.1 % — ABNORMAL HIGH (ref 11.0–14.6)
WBC: 2 10*3/uL — ABNORMAL LOW (ref 4.0–10.3)

## 2018-03-28 LAB — SAMPLE TO BLOOD BANK

## 2018-03-28 MED ORDER — DARBEPOETIN ALFA 300 MCG/0.6ML IJ SOSY
300.0000 ug | PREFILLED_SYRINGE | Freq: Once | INTRAMUSCULAR | Status: AC
  Administered 2018-03-28: 300 ug via SUBCUTANEOUS

## 2018-03-28 NOTE — Patient Instructions (Signed)

## 2018-03-29 ENCOUNTER — Inpatient Hospital Stay: Payer: Medicare Other

## 2018-03-29 DIAGNOSIS — D469 Myelodysplastic syndrome, unspecified: Secondary | ICD-10-CM

## 2018-03-29 DIAGNOSIS — D649 Anemia, unspecified: Secondary | ICD-10-CM

## 2018-03-29 LAB — PREPARE RBC (CROSSMATCH)

## 2018-03-29 MED ORDER — SODIUM CHLORIDE 0.9% FLUSH
10.0000 mL | INTRAVENOUS | Status: DC | PRN
  Filled 2018-03-29: qty 10

## 2018-03-29 MED ORDER — ACETAMINOPHEN 325 MG PO TABS
650.0000 mg | ORAL_TABLET | Freq: Once | ORAL | Status: AC
  Administered 2018-03-29: 650 mg via ORAL

## 2018-03-29 MED ORDER — DIPHENHYDRAMINE HCL 25 MG PO CAPS
ORAL_CAPSULE | ORAL | Status: AC
  Filled 2018-03-29: qty 1

## 2018-03-29 MED ORDER — SODIUM CHLORIDE 0.9 % IV SOLN: 250.0000 mL | Freq: Once | INTRAVENOUS | Status: DC

## 2018-03-29 MED ORDER — ACETAMINOPHEN 325 MG PO TABS
ORAL_TABLET | ORAL | Status: AC
  Filled 2018-03-29: qty 2

## 2018-03-29 MED ORDER — DIPHENHYDRAMINE HCL 25 MG PO CAPS
25.0000 mg | ORAL_CAPSULE | Freq: Once | ORAL | Status: AC
  Administered 2018-03-29: 25 mg via ORAL

## 2018-03-29 NOTE — Patient Instructions (Signed)

## 2018-03-30 LAB — TYPE AND SCREEN
ABO/RH(D): A POS
Antibody Screen: NEGATIVE
UNIT DIVISION: 0
Unit division: 0

## 2018-03-30 LAB — BPAM RBC
BLOOD PRODUCT EXPIRATION DATE: 201907262359
BLOOD PRODUCT EXPIRATION DATE: 201907262359
ISSUE DATE / TIME: 201907020833
ISSUE DATE / TIME: 201907020833
Unit Type and Rh: 6200
Unit Type and Rh: 6200

## 2018-04-18 ENCOUNTER — Inpatient Hospital Stay: Payer: Medicare Other

## 2018-04-18 ENCOUNTER — Other Ambulatory Visit: Payer: Self-pay | Admitting: *Deleted

## 2018-04-18 VITALS — BP 99/51 | HR 85 | Resp 18

## 2018-04-18 DIAGNOSIS — D469 Myelodysplastic syndrome, unspecified: Secondary | ICD-10-CM | POA: Diagnosis not present

## 2018-04-18 DIAGNOSIS — D539 Nutritional anemia, unspecified: Secondary | ICD-10-CM

## 2018-04-18 LAB — CBC WITH DIFFERENTIAL/PLATELET
Basophils Absolute: 0 10*3/uL (ref 0.0–0.1)
Basophils Relative: 2 %
EOS PCT: 1 %
Eosinophils Absolute: 0 10*3/uL (ref 0.0–0.5)
HCT: 19.5 % — ABNORMAL LOW (ref 38.4–49.9)
HEMOGLOBIN: 6.4 g/dL — AB (ref 13.0–17.1)
LYMPHS ABS: 0.9 10*3/uL (ref 0.9–3.3)
LYMPHS PCT: 48 %
MCH: 29.2 pg (ref 27.2–33.4)
MCHC: 32.8 g/dL (ref 32.0–36.0)
MCV: 89 fL (ref 79.3–98.0)
MONO ABS: 0.4 10*3/uL (ref 0.1–0.9)
MONOS PCT: 23 %
NEUTROS ABS: 0.5 10*3/uL — AB (ref 1.5–6.5)
Neutrophils Relative %: 26 %
Platelets: 55 10*3/uL — ABNORMAL LOW (ref 140–400)
RBC: 2.19 MIL/uL — ABNORMAL LOW (ref 4.20–5.82)
RDW: 18.3 % — AB (ref 11.0–14.6)
WBC: 1.8 10*3/uL — ABNORMAL LOW (ref 4.0–10.3)
nRBC: 1 /100 WBC — ABNORMAL HIGH

## 2018-04-18 LAB — SAMPLE TO BLOOD BANK

## 2018-04-18 LAB — PREPARE RBC (CROSSMATCH)

## 2018-04-18 MED ORDER — SODIUM CHLORIDE 0.9 % IV SOLN
250.0000 mL | Freq: Once | INTRAVENOUS | Status: AC
  Administered 2018-04-18: 250 mL via INTRAVENOUS

## 2018-04-18 MED ORDER — ACETAMINOPHEN 325 MG PO TABS
ORAL_TABLET | ORAL | Status: AC
  Filled 2018-04-18: qty 2

## 2018-04-18 MED ORDER — DARBEPOETIN ALFA 300 MCG/0.6ML IJ SOSY
PREFILLED_SYRINGE | INTRAMUSCULAR | Status: AC
  Filled 2018-04-18: qty 0.6

## 2018-04-18 MED ORDER — DIPHENHYDRAMINE HCL 25 MG PO CAPS
25.0000 mg | ORAL_CAPSULE | Freq: Once | ORAL | Status: AC
  Administered 2018-04-18: 25 mg via ORAL

## 2018-04-18 MED ORDER — DARBEPOETIN ALFA 300 MCG/0.6ML IJ SOSY
300.0000 ug | PREFILLED_SYRINGE | Freq: Once | INTRAMUSCULAR | Status: AC
  Administered 2018-04-18: 300 ug via SUBCUTANEOUS

## 2018-04-18 MED ORDER — ACETAMINOPHEN 325 MG PO TABS
650.0000 mg | ORAL_TABLET | Freq: Once | ORAL | Status: AC
  Administered 2018-04-18: 650 mg via ORAL

## 2018-04-18 MED ORDER — DIPHENHYDRAMINE HCL 25 MG PO CAPS
ORAL_CAPSULE | ORAL | Status: AC
  Filled 2018-04-18: qty 1

## 2018-04-18 NOTE — Patient Instructions (Signed)

## 2018-04-19 LAB — TYPE AND SCREEN
ABO/RH(D): A POS
ANTIBODY SCREEN: NEGATIVE
UNIT DIVISION: 0
Unit division: 0

## 2018-04-19 LAB — BPAM RBC
BLOOD PRODUCT EXPIRATION DATE: 201908092359
BLOOD PRODUCT EXPIRATION DATE: 201908092359
ISSUE DATE / TIME: 201907221210
ISSUE DATE / TIME: 201907221210
UNIT TYPE AND RH: 6200
UNIT TYPE AND RH: 6200

## 2018-05-09 ENCOUNTER — Encounter: Payer: Self-pay | Admitting: Internal Medicine

## 2018-05-09 ENCOUNTER — Inpatient Hospital Stay: Payer: Medicare Other | Attending: Internal Medicine | Admitting: Internal Medicine

## 2018-05-09 ENCOUNTER — Inpatient Hospital Stay: Payer: Medicare Other

## 2018-05-09 ENCOUNTER — Telehealth: Payer: Self-pay

## 2018-05-09 ENCOUNTER — Other Ambulatory Visit: Payer: Self-pay | Admitting: *Deleted

## 2018-05-09 ENCOUNTER — Other Ambulatory Visit: Payer: Self-pay

## 2018-05-09 ENCOUNTER — Ambulatory Visit: Payer: Medicare Other

## 2018-05-09 VITALS — BP 110/64 | HR 86 | Temp 97.8°F | Resp 18 | Ht 68.0 in | Wt 147.1 lb

## 2018-05-09 VITALS — BP 110/67 | HR 81 | Temp 97.8°F | Resp 16

## 2018-05-09 DIAGNOSIS — D708 Other neutropenia: Secondary | ICD-10-CM

## 2018-05-09 DIAGNOSIS — D469 Myelodysplastic syndrome, unspecified: Secondary | ICD-10-CM

## 2018-05-09 DIAGNOSIS — Z9221 Personal history of antineoplastic chemotherapy: Secondary | ICD-10-CM | POA: Diagnosis not present

## 2018-05-09 DIAGNOSIS — D4620 Refractory anemia with excess of blasts, unspecified: Secondary | ICD-10-CM | POA: Diagnosis present

## 2018-05-09 DIAGNOSIS — D539 Nutritional anemia, unspecified: Secondary | ICD-10-CM

## 2018-05-09 LAB — CBC WITH DIFFERENTIAL (CANCER CENTER ONLY)
BASOS ABS: 0.1 10*3/uL (ref 0.0–0.1)
BASOS PCT: 3 %
EOS ABS: 0 10*3/uL (ref 0.0–0.5)
EOS PCT: 1 %
HCT: 19 % — ABNORMAL LOW (ref 38.4–49.9)
Hemoglobin: 6.3 g/dL — CL (ref 13.0–17.1)
Lymphocytes Relative: 49 %
Lymphs Abs: 0.9 10*3/uL (ref 0.9–3.3)
MCH: 29.9 pg (ref 27.2–33.4)
MCHC: 33.2 g/dL (ref 32.0–36.0)
MCV: 90 fL (ref 79.3–98.0)
Monocytes Absolute: 0.4 10*3/uL (ref 0.1–0.9)
Monocytes Relative: 21 %
Neutro Abs: 0.5 10*3/uL — ABNORMAL LOW (ref 1.5–6.5)
Neutrophils Relative %: 26 %
Platelet Count: 60 10*3/uL — ABNORMAL LOW (ref 140–400)
RBC: 2.11 MIL/uL — AB (ref 4.20–5.82)
RDW: 18.3 % — ABNORMAL HIGH (ref 11.0–14.6)
Smear Review: 7
WBC: 1.8 10*3/uL — AB (ref 4.0–10.3)

## 2018-05-09 LAB — IRON AND TIBC
IRON: 122 ug/dL (ref 42–163)
SATURATION RATIOS: 96 % (ref 42–163)
TIBC: 126 ug/dL — ABNORMAL LOW (ref 202–409)
UIBC: 5 ug/dL

## 2018-05-09 LAB — PREPARE RBC (CROSSMATCH)

## 2018-05-09 LAB — FERRITIN: Ferritin: 1649 ng/mL — ABNORMAL HIGH (ref 24–336)

## 2018-05-09 LAB — SAMPLE TO BLOOD BANK

## 2018-05-09 MED ORDER — ACETAMINOPHEN 325 MG PO TABS
650.0000 mg | ORAL_TABLET | Freq: Once | ORAL | Status: AC
  Administered 2018-05-09: 650 mg via ORAL

## 2018-05-09 MED ORDER — FUROSEMIDE 10 MG/ML IJ SOLN: 20.0000 mg | Freq: Once | INTRAMUSCULAR | Status: DC

## 2018-05-09 MED ORDER — DARBEPOETIN ALFA 300 MCG/0.6ML IJ SOSY
PREFILLED_SYRINGE | INTRAMUSCULAR | Status: AC
  Filled 2018-05-09: qty 0.6

## 2018-05-09 MED ORDER — DIPHENHYDRAMINE HCL 25 MG PO CAPS
25.0000 mg | ORAL_CAPSULE | Freq: Once | ORAL | Status: AC
  Administered 2018-05-09: 25 mg via ORAL

## 2018-05-09 MED ORDER — DARBEPOETIN ALFA 300 MCG/0.6ML IJ SOSY
300.0000 ug | PREFILLED_SYRINGE | Freq: Once | INTRAMUSCULAR | Status: AC
  Administered 2018-05-09: 300 ug via SUBCUTANEOUS

## 2018-05-09 MED ORDER — ACETAMINOPHEN 325 MG PO TABS
ORAL_TABLET | ORAL | Status: AC
  Filled 2018-05-09: qty 2

## 2018-05-09 MED ORDER — DIPHENHYDRAMINE HCL 25 MG PO CAPS
ORAL_CAPSULE | ORAL | Status: AC
  Filled 2018-05-09: qty 1

## 2018-05-09 MED ORDER — SODIUM CHLORIDE 0.9% IV SOLUTION
250.0000 mL | Freq: Once | INTRAVENOUS | Status: AC
  Administered 2018-05-09: 250 mL via INTRAVENOUS
  Filled 2018-05-09: qty 250

## 2018-05-09 NOTE — Telephone Encounter (Signed)
Patient requested to change days to Tuesday. Printed avs and calender of upcoming appointment. Per 8/12 los

## 2018-05-09 NOTE — Patient Instructions (Signed)

## 2018-05-09 NOTE — Progress Notes (Signed)
[place] [phone]   Fax:([phone]  OFFICE PROGRESS NOTE  Lorene Dy, MD [address], [place] [address] [place] [address]  DIAGNOSIS: 1)  myelodysplastic syndrome consistent with refractory anemia with excess blast diagnosed in December 2016 2)  History of ITP  PRIOR THERAPY: Vidaza 75 mg/m2 IV for 5 days every 4 weeks. Status post 3 cycles.  CURRENT THERAPY:  1) Supportive care with Aranesp 300 g subcutaneously every 3 weeks in addition to Granix 300 mcg subcutaneously for neutropenia as needed. 2) PRBCs transfusion requirement now at regular basis.  INTERVAL HISTORY: Dean Stanley 84 y.o. male returns to the clinic today for follow-up visit accompanied by his wife.  The patient continues to complain of increasing fatigue and weakness as well as dizzy spells.  He also has shortness of breath with exertion.  He has been hypotensive recently.  His CBC over the last few months showed hemoglobin in the range of 6.0 G/DL and the patient has been requiring PRBCs transfusion at regular basis.  He is also currently on treatment with Aranesp 300 mcg subcutaneously every 3 weeks.  He denied having any fever or chills.  He has no nausea, vomiting, diarrhea or constipation.  He was not interested in resuming any systemic chemotherapy.  He is here today for evaluation and repeat blood work.  MEDICAL HISTORY: Past Medical History:  Diagnosis Date  . Anemia   . Cancer (HCC)    myelodysplastic syndrome  . Complication of anesthesia    Reaction to anesthesia " things went haywire around me"  . Do not resuscitate 11/04/2015  . Hearing impairment    wears hearing left ear  . History of ITP 1960  . Hypothyroidism   . Meniere's syndrome 1980s  . Port catheter in place 12/23/2015    ALLERGIES:  is allergic to percocet [oxycodone-acetaminophen]; hydrocodone; and oxycodone.  MEDICATIONS:  Current Outpatient Medications  Medication Sig Dispense Refill    . calcipotriene (DOVONOX) 0.005 % ointment Apply topically 2 (two) times daily.    . cephALEXin (KEFLEX) 500 MG capsule Take 500 mg by mouth 4 (four) times daily.    . Darbepoetin Alfa (ARANESP) 300 MCG/0.6ML SOSY injection Inject 300 mcg into the skin every 30 (thirty) days. As needed based on blood counts    . desmopressin (DDAVP) 0.2 MG tablet Take 0.2 mg by mouth daily.    . ferrous sulfate 325 (65 FE) MG tablet Take 325 mg by mouth daily with breakfast.    . finasteride (PROPECIA) 1 MG tablet Take 1 mg by mouth daily with breakfast.     . Flaxseed, Linseed, (FLAX SEED OIL PO) Take 1 capsule by mouth daily.    Marland Kitchen levothyroxine (SYNTHROID, LEVOTHROID) 200 MCG tablet Take 200 mcg by mouth at bedtime.     . Lutein 40 MG CAPS Take 40 mg by mouth daily with breakfast.     . Multiple Vitamins-Minerals (CENTRUM ADULTS PO) Take 1 tablet by mouth daily.     Marland Kitchen omeprazole (PRILOSEC) 20 MG capsule Take 20 mg by mouth daily.    . Tbo-Filgrastim (GRANIX) 300 MCG/0.5ML SOSY injection Inject 300 mcg into the skin every 30 (thirty) days.    Marland Kitchen triamcinolone (KENALOG) 0.025 % cream Apply 1 application topically 2 (two) times daily as needed (for inflammation).      No current facility-administered medications for this visit.     SURGICAL HISTORY:  Past Surgical History:  Procedure Laterality Date  . cataract  Right 2011  . CIRCUMCISION    . COLONOSCOPY    . cortisone injections to back x 6    . ORIF ORBITAL FRACTURE Left 2000   2 fractures orbital platform    REVIEW OF SYSTEMS:  Constitutional: positive for fatigue Eyes: negative Ears, nose, mouth, throat, and face: negative Respiratory: positive for dyspnea on exertion Cardiovascular: positive for fatigue Gastrointestinal: negative Genitourinary:negative Integument/breast: negative Hematologic/lymphatic: negative Musculoskeletal:negative Neurological: negative Behavioral/Psych: negative Endocrine: negative Allergic/Immunologic: negative    PHYSICAL EXAMINATION: General appearance: alert, cooperative, fatigued and no distress Head: Normocephalic, without obvious abnormality, atraumatic Neck: no adenopathy, no JVD, supple, symmetrical, trachea midline and thyroid not enlarged, symmetric, no tenderness/mass/nodules Lymph nodes: Cervical, supraclavicular, and axillary nodes normal. Resp: clear to auscultation bilaterally Back: symmetric, no curvature. ROM normal. No CVA tenderness. Cardio: regular rate and rhythm, S1, S2 normal, no murmur, click, rub or gallop GI: soft, non-tender; bowel sounds normal; no masses,  no organomegaly Extremities: extremities normal, atraumatic, no cyanosis or edema Neurologic: Alert and oriented X 3, normal strength and tone. Normal symmetric reflexes. Normal coordination and gait  ECOG PERFORMANCE STATUS: 1 - Symptomatic but completely ambulatory  Blood pressure 110/64, pulse 86, temperature 97.8 F (36.6 C), temperature source Oral, resp. rate 18, height 5' 8"  (1.727 m), weight 147 lb 1.6 oz (66.7 kg), SpO2 100 %.  LABORATORY DATA: Lab Results  Component Value Date   WBC 1.8 (L) 05/09/2018   HGB 6.3 (LL) 05/09/2018   HCT 19.0 (L) 05/09/2018   MCV 90.0 05/09/2018   PLT 60 (L) 05/09/2018      Chemistry      Component Value Date/Time   NA 134 (L) 01/24/2018 0953   NA 134 (L) 09/29/2017 1134   K 4.1 01/24/2018 0953   K 4.1 09/29/2017 1134   CL 102 01/24/2018 0953   CO2 26 01/24/2018 0953   CO2 26 09/29/2017 1134   BUN 14 01/24/2018 0953   BUN 14.8 09/29/2017 1134   CREATININE 0.85 01/24/2018 0953   CREATININE 0.8 09/29/2017 1134      Component Value Date/Time   CALCIUM 9.0 01/24/2018 0953   CALCIUM 8.8 09/29/2017 1134   ALKPHOS 60 01/24/2018 0953   ALKPHOS 41 09/29/2017 1134   AST 18 01/24/2018 0953   AST 13 09/29/2017 1134   ALT 12 01/24/2018 0953   ALT 12 09/29/2017 1134   BILITOT 0.8 01/24/2018 0953   BILITOT 1.37 (H) 09/29/2017 1134       RADIOGRAPHIC STUDIES: No  results found.  ASSESSMENT AND PLAN:   This is a very pleasant 84 years old white male with myelodysplastic syndrome consistent with refractory anemia with excess blasts status post systemic chemotherapy with Vidaza 3 cycles and has been observation since that time. The patient is currently on Aranesp injection every 3 weeks and tolerating it well. His hemoglobin and hematocrit has been very low over the last few months and the patient require PRBCs transfusion more frequently. Hemoglobin today was 6.3.  I will arrange for the patient to receive 2 units of PRBCs transfusion. I also had a lengthy discussion with the patient about his condition and treatment options.  I offered him repeat bone marrow biopsy and aspirate as well as consideration of treatment with chemotherapy depending on the results of the bone marrow.  I also offered the patient a second opinion at [facility] to see if there is any other option for management of his condition. He is still not interested in considering chemotherapy.  He agreed to go to [facility] for the second opinion. He will continue his current treatment with Aranesp every 3 weeks. I will see him back for follow-up visit in 3 months for evaluation and repeat CBC, iron study and ferritin. He was advised to call immediately if he has any concerning symptoms in the interval. The patient voices understanding of current disease status and treatment options and is in agreement with the current care plan. All questions were answered. The patient knows to call the clinic with any problems, questions or concerns. We can certainly see the patient much sooner if necessary.  Disclaimer: This note was dictated with voice recognition software. Similar sounding words can inadvertently be transcribed and may not be corrected upon review.

## 2018-05-10 LAB — TYPE AND SCREEN
ABO/RH(D): A POS
ANTIBODY SCREEN: NEGATIVE
UNIT DIVISION: 0
Unit division: 0

## 2018-05-10 LAB — BPAM RBC
Blood Product Expiration Date: 201908142359
Blood Product Expiration Date: 201909032359
ISSUE DATE / TIME: 201908121309
ISSUE DATE / TIME: 201908121309
UNIT TYPE AND RH: 6200
Unit Type and Rh: 6200

## 2018-05-18 ENCOUNTER — Telehealth: Payer: Self-pay | Admitting: Medical Oncology

## 2018-05-18 NOTE — Telephone Encounter (Signed)
Referral called to scheduler at Dr. Florene Glen . They requested records including path reports and cytogenetics.

## 2018-05-19 ENCOUNTER — Telehealth: Payer: Self-pay | Admitting: Internal Medicine

## 2018-05-19 NOTE — Telephone Encounter (Signed)
Faxed records to Dr. Florene Glen at [facility]

## 2018-05-23 ENCOUNTER — Telehealth: Payer: Self-pay | Admitting: Internal Medicine

## 2018-05-23 NOTE — Telephone Encounter (Signed)
Patient has appt to see Dr. Florene Glen at [facility], medical records faxed over, Release ID: [id number]

## 2018-05-31 ENCOUNTER — Inpatient Hospital Stay: Payer: Medicare Other

## 2018-05-31 ENCOUNTER — Other Ambulatory Visit: Payer: Self-pay | Admitting: Medical Oncology

## 2018-05-31 ENCOUNTER — Inpatient Hospital Stay: Payer: Medicare Other | Attending: Internal Medicine

## 2018-05-31 ENCOUNTER — Telehealth: Payer: Self-pay | Admitting: Medical Oncology

## 2018-05-31 VITALS — BP 93/47 | HR 82

## 2018-05-31 DIAGNOSIS — D4620 Refractory anemia with excess of blasts, unspecified: Secondary | ICD-10-CM | POA: Diagnosis present

## 2018-05-31 DIAGNOSIS — D649 Anemia, unspecified: Secondary | ICD-10-CM

## 2018-05-31 DIAGNOSIS — D539 Nutritional anemia, unspecified: Secondary | ICD-10-CM

## 2018-05-31 DIAGNOSIS — D469 Myelodysplastic syndrome, unspecified: Secondary | ICD-10-CM

## 2018-05-31 LAB — CBC WITH DIFFERENTIAL/PLATELET
BASOS PCT: 3 %
Basophils Absolute: 0.1 10*3/uL (ref 0.0–0.1)
EOS PCT: 0 %
Eosinophils Absolute: 0 10*3/uL (ref 0.0–0.5)
HEMATOCRIT: 18.2 % — AB (ref 38.4–49.9)
Hemoglobin: 5.9 g/dL — CL (ref 13.0–17.1)
Lymphocytes Relative: 37 %
Lymphs Abs: 0.8 10*3/uL — ABNORMAL LOW (ref 0.9–3.3)
MCH: 29.4 pg (ref 27.2–33.4)
MCHC: 32.4 g/dL (ref 32.0–36.0)
MCV: 90.5 fL (ref 79.3–98.0)
MONO ABS: 0.6 10*3/uL (ref 0.1–0.9)
Monocytes Relative: 25 %
Neutro Abs: 0.8 10*3/uL — ABNORMAL LOW (ref 1.5–6.5)
Neutrophils Relative %: 35 %
Platelets: 61 10*3/uL — ABNORMAL LOW (ref 140–400)
RBC: 2.01 MIL/uL — AB (ref 4.20–5.82)
RDW: 17.9 % — ABNORMAL HIGH (ref 11.0–14.6)
WBC: 2.2 10*3/uL — AB (ref 4.0–10.3)

## 2018-05-31 LAB — SAMPLE TO BLOOD BANK

## 2018-05-31 LAB — PREPARE RBC (CROSSMATCH)

## 2018-05-31 MED ORDER — DIPHENHYDRAMINE HCL 25 MG PO CAPS
25.0000 mg | ORAL_CAPSULE | Freq: Once | ORAL | Status: AC
  Administered 2018-05-31: 25 mg via ORAL

## 2018-05-31 MED ORDER — ACETAMINOPHEN 325 MG PO TABS
ORAL_TABLET | ORAL | Status: AC
  Filled 2018-05-31: qty 2

## 2018-05-31 MED ORDER — DIPHENHYDRAMINE HCL 25 MG PO CAPS
ORAL_CAPSULE | ORAL | Status: AC
  Filled 2018-05-31: qty 1

## 2018-05-31 MED ORDER — DARBEPOETIN ALFA 300 MCG/0.6ML IJ SOSY
PREFILLED_SYRINGE | INTRAMUSCULAR | Status: AC
  Filled 2018-05-31: qty 0.6

## 2018-05-31 MED ORDER — ACETAMINOPHEN 325 MG PO TABS
650.0000 mg | ORAL_TABLET | Freq: Once | ORAL | Status: AC
  Administered 2018-05-31: 650 mg via ORAL

## 2018-05-31 MED ORDER — DARBEPOETIN ALFA 300 MCG/0.6ML IJ SOSY
300.0000 ug | PREFILLED_SYRINGE | Freq: Once | INTRAMUSCULAR | Status: AC
  Administered 2018-05-31: 300 ug via SUBCUTANEOUS

## 2018-05-31 NOTE — Telephone Encounter (Signed)
Blood transfusion requested.

## 2018-05-31 NOTE — Patient Instructions (Signed)

## 2018-06-01 ENCOUNTER — Other Ambulatory Visit: Payer: Self-pay | Admitting: Internal Medicine

## 2018-06-01 ENCOUNTER — Ambulatory Visit
Admission: RE | Admit: 2018-06-01 | Discharge: 2018-06-01 | Disposition: A | Discharge status: Home / Self Care | Payer: Medicare Other | Source: Ambulatory Visit | Attending: Internal Medicine | Admitting: Internal Medicine

## 2018-06-01 DIAGNOSIS — R059 Cough, unspecified: Secondary | ICD-10-CM

## 2018-06-01 DIAGNOSIS — R05 Cough: Secondary | ICD-10-CM

## 2018-06-01 LAB — TYPE AND SCREEN
ABO/RH(D): A POS
ANTIBODY SCREEN: NEGATIVE
UNIT DIVISION: 0
Unit division: 0

## 2018-06-01 LAB — BPAM RBC
Blood Product Expiration Date: 201909242359
Blood Product Expiration Date: 201909242359
ISSUE DATE / TIME: 201909031125
ISSUE DATE / TIME: 201909031125
Unit Type and Rh: 6200
Unit Type and Rh: 6200

## 2018-06-06 ENCOUNTER — Other Ambulatory Visit: Payer: Self-pay | Admitting: Internal Medicine

## 2018-06-06 DIAGNOSIS — R911 Solitary pulmonary nodule: Secondary | ICD-10-CM

## 2018-06-13 ENCOUNTER — Other Ambulatory Visit: Payer: Medicare Other

## 2018-06-13 ENCOUNTER — Ambulatory Visit
Admission: RE | Admit: 2018-06-13 | Discharge: 2018-06-13 | Disposition: A | Discharge status: Home / Self Care | Payer: Medicare Other | Source: Ambulatory Visit | Attending: Internal Medicine | Admitting: Internal Medicine

## 2018-06-13 DIAGNOSIS — R911 Solitary pulmonary nodule: Secondary | ICD-10-CM

## 2018-06-13 MED ORDER — IOPAMIDOL (ISOVUE-300) INJECTION 61%
75.0000 mL | Freq: Once | INTRAVENOUS | Status: AC | PRN
  Administered 2018-06-13: 75 mL via INTRAVENOUS

## 2018-06-14 ENCOUNTER — Other Ambulatory Visit: Payer: Medicare Other

## 2018-06-14 ENCOUNTER — Encounter: Payer: Self-pay | Admitting: *Deleted

## 2018-06-20 ENCOUNTER — Other Ambulatory Visit: Payer: Self-pay | Admitting: Medical Oncology

## 2018-06-20 ENCOUNTER — Other Ambulatory Visit: Payer: Self-pay | Admitting: *Deleted

## 2018-06-20 DIAGNOSIS — D469 Myelodysplastic syndrome, unspecified: Secondary | ICD-10-CM

## 2018-06-20 NOTE — Progress Notes (Signed)
Pt wife called request for type and hold as pt has been bedridden x 5days. Reviewed with MD, T&H ordered, appt for 9/25 8am scheduled with Threasa Beards, RN. Message to scheduling.  Returned call to pt wife discussed above information.

## 2018-06-21 ENCOUNTER — Other Ambulatory Visit: Payer: Medicare Other

## 2018-06-21 ENCOUNTER — Inpatient Hospital Stay: Payer: Medicare Other

## 2018-06-21 ENCOUNTER — Other Ambulatory Visit: Payer: Self-pay | Admitting: *Deleted

## 2018-06-21 DIAGNOSIS — D4620 Refractory anemia with excess of blasts, unspecified: Secondary | ICD-10-CM | POA: Diagnosis not present

## 2018-06-21 DIAGNOSIS — D649 Anemia, unspecified: Secondary | ICD-10-CM

## 2018-06-21 DIAGNOSIS — D469 Myelodysplastic syndrome, unspecified: Secondary | ICD-10-CM

## 2018-06-21 LAB — CBC WITH DIFFERENTIAL (CANCER CENTER ONLY)
BAND NEUTROPHILS: 6 %
BASOS ABS: 0 10*3/uL (ref 0.0–0.1)
BASOS PCT: 0 %
Blasts: 8 %
EOS PCT: 0 %
Eosinophils Absolute: 0 10*3/uL (ref 0.0–0.5)
HEMATOCRIT: 17.2 % — AB (ref 38.4–49.9)
HEMOGLOBIN: 5.6 g/dL — AB (ref 13.0–17.1)
LYMPHS PCT: 50 %
Lymphs Abs: 1.2 10*3/uL (ref 0.9–3.3)
MCH: 29.6 pg (ref 27.2–33.4)
MCHC: 32.6 g/dL (ref 32.0–36.0)
MCV: 91 fL (ref 79.3–98.0)
Metamyelocytes Relative: 3 %
Monocytes Absolute: 0 10*3/uL — ABNORMAL LOW (ref 0.1–0.9)
Monocytes Relative: 2 %
Myelocytes: 2 %
NEUTROS ABS: 0.8 10*3/uL — AB (ref 1.5–6.5)
Neutrophils Relative %: 21 %
Other: 8 %
PROMYELOCYTES RELATIVE: 0 %
Platelet Count: 64 10*3/uL — ABNORMAL LOW (ref 140–400)
RBC: 1.89 MIL/uL — AB (ref 4.20–5.82)
RDW: 17.3 % — ABNORMAL HIGH (ref 11.0–14.6)
Smear Review: 10
WBC: 2.4 10*3/uL — AB (ref 4.0–10.3)
nRBC: 0 /100 WBC

## 2018-06-21 LAB — SAMPLE TO BLOOD BANK

## 2018-06-21 MED ORDER — DARBEPOETIN ALFA 300 MCG/0.6ML IJ SOSY: 300.0000 ug | PREFILLED_SYRINGE | Freq: Once | INTRAMUSCULAR | Status: AC

## 2018-06-21 MED ORDER — DARBEPOETIN ALFA 300 MCG/0.6ML IJ SOSY
PREFILLED_SYRINGE | INTRAMUSCULAR | Status: AC
  Filled 2018-06-21: qty 0.6

## 2018-06-22 ENCOUNTER — Inpatient Hospital Stay: Payer: Medicare Other

## 2018-06-22 VITALS — BP 104/51 | HR 79 | Temp 98.6°F | Resp 18

## 2018-06-22 DIAGNOSIS — D649 Anemia, unspecified: Secondary | ICD-10-CM

## 2018-06-22 DIAGNOSIS — D469 Myelodysplastic syndrome, unspecified: Secondary | ICD-10-CM

## 2018-06-22 DIAGNOSIS — D539 Nutritional anemia, unspecified: Secondary | ICD-10-CM

## 2018-06-22 LAB — PREPARE RBC (CROSSMATCH)

## 2018-06-22 MED ORDER — DIPHENHYDRAMINE HCL 25 MG PO CAPS
ORAL_CAPSULE | ORAL | Status: AC
  Filled 2018-06-22: qty 1

## 2018-06-22 MED ORDER — ACETAMINOPHEN 325 MG PO TABS
ORAL_TABLET | ORAL | Status: AC
  Filled 2018-06-22: qty 2

## 2018-06-22 MED ORDER — ACETAMINOPHEN 325 MG PO TABS
650.0000 mg | ORAL_TABLET | Freq: Once | ORAL | Status: AC
  Administered 2018-06-22: 650 mg via ORAL

## 2018-06-22 MED ORDER — DARBEPOETIN ALFA 300 MCG/0.6ML IJ SOSY
PREFILLED_SYRINGE | INTRAMUSCULAR | Status: AC
  Filled 2018-06-22: qty 0.6

## 2018-06-22 MED ORDER — SODIUM CHLORIDE 0.9% IV SOLUTION
250.0000 mL | Freq: Once | INTRAVENOUS | Status: AC
  Administered 2018-06-22: 250 mL via INTRAVENOUS
  Filled 2018-06-22: qty 250

## 2018-06-22 MED ORDER — DARBEPOETIN ALFA 300 MCG/0.6ML IJ SOSY
300.0000 ug | PREFILLED_SYRINGE | Freq: Once | INTRAMUSCULAR | Status: AC
  Administered 2018-06-22: 300 ug via SUBCUTANEOUS

## 2018-06-22 MED ORDER — DIPHENHYDRAMINE HCL 25 MG PO CAPS
25.0000 mg | ORAL_CAPSULE | Freq: Once | ORAL | Status: AC
  Administered 2018-06-22: 25 mg via ORAL

## 2018-06-22 NOTE — Patient Instructions (Signed)

## 2018-06-23 ENCOUNTER — Other Ambulatory Visit: Payer: Self-pay | Admitting: Medical Oncology

## 2018-06-23 DIAGNOSIS — D469 Myelodysplastic syndrome, unspecified: Secondary | ICD-10-CM

## 2018-06-23 LAB — TYPE AND SCREEN
ABO/RH(D): A POS
ANTIBODY SCREEN: NEGATIVE
UNIT DIVISION: 0
Unit division: 0

## 2018-06-23 LAB — BPAM RBC
BLOOD PRODUCT EXPIRATION DATE: 201910142359
BLOOD PRODUCT EXPIRATION DATE: 201910172359
ISSUE DATE / TIME: 201909250830
ISSUE DATE / TIME: 201909250830
Unit Type and Rh: 6200
Unit Type and Rh: 6200

## 2018-06-24 ENCOUNTER — Telehealth: Payer: Self-pay | Admitting: Internal Medicine

## 2018-06-24 NOTE — Telephone Encounter (Signed)
Scheduled appt per 9/26 sch message - pt is aware of ap date and time.

## 2018-06-25 ENCOUNTER — Inpatient Hospital Stay (HOSPITAL_COMMUNITY)
Admission: EM | Admit: 2018-06-25 | Discharge: 2018-06-30 | DRG: 445 | Disposition: A | Discharge status: Home / Self Care | Payer: Medicare Other | Attending: Family Medicine | Admitting: Family Medicine

## 2018-06-25 ENCOUNTER — Encounter (HOSPITAL_COMMUNITY): Payer: Self-pay | Admitting: Emergency Medicine

## 2018-06-25 ENCOUNTER — Emergency Department (HOSPITAL_COMMUNITY): Payer: Medicare Other

## 2018-06-25 ENCOUNTER — Other Ambulatory Visit: Payer: Self-pay

## 2018-06-25 DIAGNOSIS — R64 Cachexia: Secondary | ICD-10-CM | POA: Diagnosis present

## 2018-06-25 DIAGNOSIS — N200 Calculus of kidney: Secondary | ICD-10-CM | POA: Diagnosis present

## 2018-06-25 DIAGNOSIS — D61818 Other pancytopenia: Secondary | ICD-10-CM | POA: Diagnosis present

## 2018-06-25 DIAGNOSIS — R1011 Right upper quadrant pain: Secondary | ICD-10-CM

## 2018-06-25 DIAGNOSIS — D630 Anemia in neoplastic disease: Secondary | ICD-10-CM | POA: Diagnosis present

## 2018-06-25 DIAGNOSIS — E039 Hypothyroidism, unspecified: Secondary | ICD-10-CM | POA: Diagnosis present

## 2018-06-25 DIAGNOSIS — C9200 Acute myeloblastic leukemia, not having achieved remission: Secondary | ICD-10-CM | POA: Diagnosis present

## 2018-06-25 DIAGNOSIS — Z66 Do not resuscitate: Secondary | ICD-10-CM | POA: Diagnosis present

## 2018-06-25 DIAGNOSIS — I959 Hypotension, unspecified: Secondary | ICD-10-CM | POA: Diagnosis present

## 2018-06-25 DIAGNOSIS — K810 Acute cholecystitis: Secondary | ICD-10-CM | POA: Diagnosis present

## 2018-06-25 DIAGNOSIS — K819 Cholecystitis, unspecified: Secondary | ICD-10-CM | POA: Diagnosis present

## 2018-06-25 DIAGNOSIS — K838 Other specified diseases of biliary tract: Secondary | ICD-10-CM | POA: Diagnosis present

## 2018-06-25 DIAGNOSIS — R351 Nocturia: Secondary | ICD-10-CM | POA: Diagnosis present

## 2018-06-25 DIAGNOSIS — D469 Myelodysplastic syndrome, unspecified: Secondary | ICD-10-CM | POA: Diagnosis present

## 2018-06-25 DIAGNOSIS — H8109 Meniere's disease, unspecified ear: Secondary | ICD-10-CM | POA: Diagnosis present

## 2018-06-25 DIAGNOSIS — Z6824 Body mass index (BMI) 24.0-24.9, adult: Secondary | ICD-10-CM

## 2018-06-25 DIAGNOSIS — E876 Hypokalemia: Secondary | ICD-10-CM | POA: Diagnosis present

## 2018-06-25 DIAGNOSIS — Z800 Family history of malignant neoplasm of digestive organs: Secondary | ICD-10-CM | POA: Diagnosis not present

## 2018-06-25 LAB — COMPREHENSIVE METABOLIC PANEL
ALBUMIN: 3.3 g/dL — AB (ref 3.5–5.0)
ALK PHOS: 58 U/L (ref 38–126)
ALT: 15 U/L (ref 0–44)
AST: 18 U/L (ref 15–41)
Anion gap: 6 (ref 5–15)
BILIRUBIN TOTAL: 1.2 mg/dL (ref 0.3–1.2)
BUN: 14 mg/dL (ref 8–23)
CALCIUM: 8.1 mg/dL — AB (ref 8.9–10.3)
CO2: 23 mmol/L (ref 22–32)
Chloride: 109 mmol/L (ref 98–111)
Creatinine, Ser: 0.7 mg/dL (ref 0.61–1.24)
GFR calc Af Amer: >60 mL/min (ref >60)
GFR calc non Af Amer: >60 mL/min (ref >60)
GLUCOSE: 121 mg/dL — AB (ref 70–99)
Potassium: 3.7 mmol/L (ref 3.5–5.1)
Sodium: 138 mmol/L (ref 135–145)
TOTAL PROTEIN: 7.4 g/dL (ref 6.5–8.1)

## 2018-06-25 LAB — I-STAT CG4 LACTIC ACID, ED
LACTIC ACID, VENOUS: 1.44 mmol/L (ref 0.5–1.9)
Lactic Acid, Venous: 0.46 mmol/L — ABNORMAL LOW (ref 0.5–1.9)

## 2018-06-25 LAB — POC OCCULT BLOOD, ED: FECAL OCCULT BLD: POSITIVE — AB

## 2018-06-25 LAB — CBC WITH DIFFERENTIAL/PLATELET
BASOS ABS: 0 10*3/uL (ref 0.0–0.1)
Basophils Relative: 1 %
Eosinophils Absolute: 0 10*3/uL (ref 0.0–0.7)
Eosinophils Relative: 0 %
HCT: 24.2 % — ABNORMAL LOW (ref 39.0–52.0)
Hemoglobin: 7.9 g/dL — ABNORMAL LOW (ref 13.0–17.0)
LYMPHS ABS: 0.6 10*3/uL — AB (ref 0.7–4.0)
Lymphocytes Relative: 27 %
MCH: 29 pg (ref 26.0–34.0)
MCHC: 32.6 g/dL (ref 30.0–36.0)
MCV: 89 fL (ref 78.0–100.0)
Monocytes Absolute: 0.5 10*3/uL (ref 0.1–1.0)
Monocytes Relative: 22 %
NEUTROS ABS: 1.2 10*3/uL — AB (ref 1.7–7.7)
Neutrophils Relative %: 50 %
Platelets: 79 10*3/uL — ABNORMAL LOW (ref 150–400)
RBC: 2.72 MIL/uL — ABNORMAL LOW (ref 4.22–5.81)
RDW: 16.8 % — AB (ref 11.5–15.5)
WBC: 2.3 10*3/uL — ABNORMAL LOW (ref 4.0–10.5)

## 2018-06-25 LAB — URINALYSIS, ROUTINE W REFLEX MICROSCOPIC
BILIRUBIN URINE: NEGATIVE
Glucose, UA: NEGATIVE mg/dL
Hgb urine dipstick: NEGATIVE
KETONES UR: NEGATIVE mg/dL
Leukocytes, UA: NEGATIVE
NITRITE: NEGATIVE
PH: 6 (ref 5.0–8.0)
Protein, ur: NEGATIVE mg/dL
Specific Gravity, Urine: 1.021 (ref 1.005–1.030)

## 2018-06-25 LAB — LIPASE, BLOOD: Lipase: 39 U/L (ref 11–51)

## 2018-06-25 MED ORDER — CALCIPOTRIENE 0.005 % EX OINT: TOPICAL_OINTMENT | Freq: Two times a day (BID) | CUTANEOUS | Status: DC

## 2018-06-25 MED ORDER — PROSIGHT PO TABS
1.0000 | ORAL_TABLET | Freq: Every day | ORAL | Status: DC
  Administered 2018-06-26 – 2018-06-30 (×4): 1 via ORAL
  Filled 2018-06-25 (×4): qty 1

## 2018-06-25 MED ORDER — OCUVITE-LUTEIN PO CAPS
1.0000 | ORAL_CAPSULE | Freq: Every day | ORAL | Status: DC
  Filled 2018-06-25: qty 1

## 2018-06-25 MED ORDER — ONDANSETRON HCL 4 MG/2ML IJ SOLN
4.0000 mg | Freq: Four times a day (QID) | INTRAMUSCULAR | Status: DC | PRN
  Administered 2018-06-26: 4 mg via INTRAVENOUS
  Filled 2018-06-25: qty 2

## 2018-06-25 MED ORDER — ACETAMINOPHEN 650 MG RE SUPP: 650.0000 mg | Freq: Four times a day (QID) | RECTAL | Status: DC | PRN

## 2018-06-25 MED ORDER — BISACODYL 5 MG PO TBEC
5.0000 mg | DELAYED_RELEASE_TABLET | Freq: Every day | ORAL | Status: DC | PRN
  Administered 2018-06-28: 5 mg via ORAL
  Filled 2018-06-25: qty 1

## 2018-06-25 MED ORDER — FENTANYL CITRATE (PF) 100 MCG/2ML IJ SOLN
50.0000 ug | Freq: Once | INTRAMUSCULAR | Status: AC
  Administered 2018-06-25: 50 ug via INTRAVENOUS
  Filled 2018-06-25: qty 2

## 2018-06-25 MED ORDER — DESMOPRESSIN ACETATE 0.1 MG PO TABS
0.2000 mg | ORAL_TABLET | Freq: Every day | ORAL | Status: DC
  Administered 2018-06-25: 0.2 mg via ORAL
  Filled 2018-06-25: qty 1
  Filled 2018-06-25: qty 2

## 2018-06-25 MED ORDER — IOPAMIDOL (ISOVUE-300) INJECTION 61%
100.0000 mL | Freq: Once | INTRAVENOUS | Status: AC | PRN
  Administered 2018-06-25: 100 mL via INTRAVENOUS

## 2018-06-25 MED ORDER — DESMOPRESSIN ACETATE 0.1 MG PO TABS
0.2000 mg | ORAL_TABLET | Freq: Every day | ORAL | Status: DC
  Administered 2018-06-26 – 2018-06-29 (×4): 0.2 mg via ORAL
  Filled 2018-06-25: qty 2
  Filled 2018-06-25: qty 1
  Filled 2018-06-25 (×5): qty 2

## 2018-06-25 MED ORDER — ONDANSETRON HCL 4 MG PO TABS: 4.0000 mg | ORAL_TABLET | Freq: Four times a day (QID) | ORAL | Status: DC | PRN

## 2018-06-25 MED ORDER — ADULT MULTIVITAMIN W/MINERALS CH
1.0000 | ORAL_TABLET | Freq: Every day | ORAL | Status: DC
  Administered 2018-06-28 – 2018-06-29 (×2): 1 via ORAL
  Filled 2018-06-25 (×4): qty 1

## 2018-06-25 MED ORDER — TRAMADOL HCL 50 MG PO TABS
50.0000 mg | ORAL_TABLET | Freq: Four times a day (QID) | ORAL | Status: DC | PRN
  Administered 2018-06-25 – 2018-06-27 (×4): 50 mg via ORAL
  Filled 2018-06-25 (×4): qty 1

## 2018-06-25 MED ORDER — PIPERACILLIN-TAZOBACTAM 3.375 G IVPB
3.3750 g | Freq: Three times a day (TID) | INTRAVENOUS | Status: DC
  Administered 2018-06-25 – 2018-06-29 (×11): 3.375 g via INTRAVENOUS
  Filled 2018-06-25 (×10): qty 50

## 2018-06-25 MED ORDER — SODIUM CHLORIDE 0.9 % IV SOLN
Freq: Once | INTRAVENOUS | Status: AC
  Administered 2018-06-25: 14:00:00 via INTRAVENOUS

## 2018-06-25 MED ORDER — LEVOTHYROXINE SODIUM 75 MCG PO TABS
175.0000 ug | ORAL_TABLET | Freq: Every day | ORAL | Status: DC
  Administered 2018-06-26 – 2018-06-29 (×3): 175 ug via ORAL
  Filled 2018-06-25 (×8): qty 1

## 2018-06-25 MED ORDER — TRIAMCINOLONE ACETONIDE 0.025 % EX CREA
1.0000 "application " | TOPICAL_CREAM | Freq: Two times a day (BID) | CUTANEOUS | Status: DC | PRN
  Filled 2018-06-25: qty 15

## 2018-06-25 MED ORDER — SODIUM CHLORIDE 0.9% FLUSH
3.0000 mL | Freq: Two times a day (BID) | INTRAVENOUS | Status: DC
  Administered 2018-06-25 – 2018-06-29 (×5): 3 mL via INTRAVENOUS

## 2018-06-25 MED ORDER — ACETAMINOPHEN 325 MG PO TABS
650.0000 mg | ORAL_TABLET | Freq: Four times a day (QID) | ORAL | Status: DC | PRN
  Administered 2018-06-25 – 2018-06-27 (×3): 650 mg via ORAL
  Filled 2018-06-25 (×3): qty 2

## 2018-06-25 MED ORDER — PIPERACILLIN-TAZOBACTAM 3.375 G IVPB 30 MIN: 3.3750 g | Freq: Three times a day (TID) | INTRAVENOUS | Status: DC

## 2018-06-25 MED ORDER — IOPAMIDOL (ISOVUE-300) INJECTION 61%
INTRAVENOUS | Status: AC
  Filled 2018-06-25: qty 100

## 2018-06-25 MED ORDER — PANTOPRAZOLE SODIUM 40 MG PO TBEC
40.0000 mg | DELAYED_RELEASE_TABLET | Freq: Every day | ORAL | Status: DC
  Administered 2018-06-26 – 2018-06-29 (×3): 40 mg via ORAL
  Filled 2018-06-25 (×6): qty 1

## 2018-06-25 MED ORDER — ONDANSETRON HCL 4 MG/2ML IJ SOLN
4.0000 mg | Freq: Once | INTRAMUSCULAR | Status: DC
  Filled 2018-06-25 (×2): qty 2

## 2018-06-25 MED ORDER — SODIUM CHLORIDE 0.9 % IV SOLN
INTRAVENOUS | Status: AC
  Administered 2018-06-27 – 2018-06-28 (×2): via INTRAVENOUS

## 2018-06-25 MED ORDER — FINASTERIDE 1 MG PO TABS: 1.0000 mg | ORAL_TABLET | Freq: Every day | ORAL | Status: DC

## 2018-06-25 MED ORDER — PIPERACILLIN-TAZOBACTAM 3.375 G IVPB 30 MIN
3.3750 g | Freq: Once | INTRAVENOUS | Status: AC
  Administered 2018-06-25: 3.375 g via INTRAVENOUS
  Filled 2018-06-25: qty 50

## 2018-06-25 MED ORDER — LACTATED RINGERS IV BOLUS
1000.0000 mL | Freq: Once | INTRAVENOUS | Status: AC
  Administered 2018-06-25: 1000 mL via INTRAVENOUS

## 2018-06-25 NOTE — ED Triage Notes (Signed)
Patient arrives with c/o abdominal pain and diarrhea. Patient initially had constipation 4 days ago, now experiencing diarrhea and RLQ abdominal pain. -N/V. Patient states stools are dark in color.  Hx leukemia.

## 2018-06-25 NOTE — H&P (Signed)
History and Physical    Dean Stanley FGH:[id number] DOB: 1931-05-15 DOA: 06/25/2018  PCP: Lorene Dy, MD  Patient coming from: home  Chief Complaint: abdominal pain, N/V  HPI: Dean Stanley is a 84 y.o. 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  Patient reports yesterday he began to have severe left lower quadrant pain.  Pain was sharp and did not radiate.  Patient reports that the pain would wax and wane.  Patient was unable to tolerate p.o. due to decreased appetite and significant amounts of nausea.  Patient did have some diarrhea.  He did not have any emesis.  He denies any cough, congestion, rhinorrhea, fevers, dysuria, constipation.  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  He is followed at [facility] for this primarily.  ED Course: In the ED patient's vitals were unremarkable.  Labs are notable for white count of 2.3, platelets of 79, hemoglobin 7.9.  CMP was fairly unremarkable other than a mild protein gap.  CT scan showed evidence of acute cholecystitis and right upper quadrant ultrasound confirmed this as well as a mildly dilated common bile duct.  There is no evidence of choledocholithiasis.  Patient was given IV Zosyn and blood cultures were taken.  General surgery was consulted and the recommendations are pending.  Review of Systems: As per HPI otherwise 10 point review of systems negative.    Past Medical History:  Diagnosis Date  . Anemia   . Cancer (HCC)    myelodysplastic syndrome  . Complication of anesthesia    Reaction to anesthesia " things went haywire around me"  . Do not resuscitate 11/04/2015  . Hearing impairment    wears hearing left ear  . History of ITP 1960  . Hypothyroidism     . Meniere's syndrome 1980s  . Port catheter in place 12/23/2015    Past Surgical History:  Procedure Laterality Date  . cataract Right 2011  . CIRCUMCISION    . COLONOSCOPY    . cortisone injections to back x 6    . ORIF ORBITAL FRACTURE Left 2000   2 fractures orbital platform     reports that he has never smoked. He has never used smokeless tobacco. He reports that he drinks alcohol. He reports that he does not use drugs.  Allergies  Allergen Reactions  . Percocet [Oxycodone-Acetaminophen] Nausea And Vomiting  . Hydrocodone Nausea And Vomiting  . Oxycodone Nausea And Vomiting    Family History  Problem Relation Age of Onset  . Throat cancer Other   . Pancreatic cancer Father   . Colon cancer Brother    Unacceptable: Noncontributory, unremarkable, or negative. Acceptable: Family history reviewed and not pertinent (If you reviewed it)  Prior to Admission medications   Medication Sig Start Date End Date Taking? Authorizing Provider  calcipotriene (DOVONOX) 0.005 % ointment Apply topically 2 (two) times daily.   Yes [provider]  Darbepoetin Alfa (ARANESP) 300 MCG/0.6ML SOSY injection Inject 300 mcg into the skin every 30 (thirty) days. As needed based on blood counts   Yes [provider]  desmopressin (DDAVP) 0.2 MG tablet Take 0.2 mg by mouth daily.   Yes [provider]  finasteride (PROPECIA) 1 MG tablet Take 1 mg by mouth daily with breakfast.  Yes [provider]  levothyroxine (SYNTHROID, LEVOTHROID) 175 MCG tablet Take 175 mcg by mouth at bedtime.    Yes [provider]  Lutein 40 MG CAPS Take 40 mg by mouth daily with breakfast.  10/09/15  Yes [provider]  Multiple Vitamins-Minerals (CENTRUM ADULTS PO) Take 1 tablet by mouth daily.    Yes [provider]  omeprazole (PRILOSEC) 20 MG capsule Take 20 mg by mouth daily.   Yes [provider]  traMADol (ULTRAM) 50 MG tablet Take 50 mg by  mouth every 6 (six) hours as needed.   Yes [provider]  triamcinolone (KENALOG) 0.025 % cream Apply 1 application topically 2 (two) times daily as needed (for inflammation).    Yes [provider]    Physical Exam: Vitals:   06/25/18 1026 06/25/18 1431  BP: 112/73 (!) 100/51  Pulse: 80 88  Resp: 16 18  Temp: 97.9 F (36.6 C) 99.5 F (37.5 C)  TempSrc: Oral Oral  SpO2: 98% 93%    Constitutional: NAD, calm, comfortable Vitals:   06/25/18 1026 06/25/18 1431  BP: 112/73 (!) 100/51  Pulse: 80 88  Resp: 16 18  Temp: 97.9 F (36.6 C) 99.5 F (37.5 C)  TempSrc: Oral Oral  SpO2: 98% 93%   Eyes: Anicteric sclera ENMT: Dry mucous membranes, edentulous Neck: normal, supple Respiratory: clear to auscultation bilaterally, no wheezing, no crackles. Normal respiratory effort. No accessory muscle use.  Cardiovascular: Regular rate and rhythm, no murmurs Abdomen: Significant left upper and lower quadrant tenderness, no rebound, diminished bowel sounds, no guarding Musculoskeletal: Cachectic Skin: No acute rash Neurologic: Grossly intact, moving all extremities Psychiatric: Normal judgment and insight. Alert and oriented x 3. Normal mood.    Labs on Admission: I have personally reviewed following labs and imaging studies  CBC: Recent Labs  Lab 06/21/18 0909 06/25/18 1058  WBC 2.4* 2.3*  NEUTROABS 0.8* 1.2*  HGB 5.6* 7.9*  HCT 17.2* 24.2*  MCV 91.0 89.0  PLT 64* 79*   Basic Metabolic Panel: Recent Labs  Lab 06/25/18 1058  NA 138  K 3.7  CL 109  CO2 23  GLUCOSE 121*  BUN 14  CREATININE 0.70  CALCIUM 8.1*   GFR: CrCl cannot be calculated (Unknown ideal weight.). Liver Function Tests: Recent Labs  Lab 06/25/18 1058  AST 18  ALT 15  ALKPHOS 58  BILITOT 1.2  PROT 7.4  ALBUMIN 3.3*   Recent Labs  Lab 06/25/18 1058  LIPASE 39   No results for input(s): AMMONIA in the last 168 hours. Coagulation Profile: No results for input(s): INR,  PROTIME in the last 168 hours. Cardiac Enzymes: No results for input(s): CKTOTAL, CKMB, CKMBINDEX, TROPONINI in the last 168 hours. BNP (last 3 results) No results for input(s): PROBNP in the last 8760 hours. HbA1C: No results for input(s): HGBA1C in the last 72 hours. CBG: No results for input(s): GLUCAP in the last 168 hours. Lipid Profile: No results for input(s): CHOL, HDL, LDLCALC, TRIG, CHOLHDL, LDLDIRECT in the last 72 hours. Thyroid Function Tests: No results for input(s): TSH, T4TOTAL, FREET4, T3FREE, THYROIDAB in the last 72 hours. Anemia Panel: No results for input(s): VITAMINB12, FOLATE, FERRITIN, TIBC, IRON, RETICCTPCT in the last 72 hours. Urine analysis:    Component Value Date/Time   COLORURINE AMBER (A) 06/25/2018 1023   APPEARANCEUR CLEAR 06/25/2018 1023   LABSPEC 1.021 06/25/2018 1023   PHURINE 6.0 06/25/2018 1023   GLUCOSEU NEGATIVE 06/25/2018 1023   HGBUR NEGATIVE 06/25/2018 1023  BILIRUBINUR NEGATIVE 06/25/2018 1023   KETONESUR NEGATIVE 06/25/2018 1023   PROTEINUR NEGATIVE 06/25/2018 1023   NITRITE NEGATIVE 06/25/2018 1023   LEUKOCYTESUR NEGATIVE 06/25/2018 1023    Radiological Exams on Admission: Ct Abdomen Pelvis W Contrast  Result Date: 06/25/2018 CLINICAL DATA:  Abdominal pain. Diarrhea. Constipation. RIGHT lower quadrant pain. Dark stool. Neutropenia EXAM: CT ABDOMEN AND PELVIS WITH CONTRAST TECHNIQUE: Multidetector CT imaging of the abdomen and pelvis was performed using the standard protocol following bolus administration of intravenous contrast. CONTRAST:  126mL ISOVUE-300 IOPAMIDOL (ISOVUE-300) INJECTION 61% COMPARISON:  CT 03/11/2017 FINDINGS: Lower chest: Focus of scattered small peripheral focus of consolidation air bronchograms in the peripheral RIGHT middle lobe (image 31/6). Diffuse increase in interlobular septal thickening. Hepatobiliary: Several large hepatic cysts noted. There is new distention of the gallbladder to 5.2 cm. There is  pericholecystic fluid along the distended gallbladder. No radiodense gallstones present. There is mild enhancement of the cystic duct (image 40/2). No dilatation of the common bile duct. Pancreas: Normal pancreatic duct. No pancreatic inflammation. Moderate size periampullary duodenum diverticulum noted measuring 3 cm (image 38/2). Spleen: Normal spleen Adrenals/urinary tract: Adrenal glands normal. Several nonobstructing calculi in the RIGHT kidney and LEFT kidney. Ureters bladder normal. No obstructive uropathy. Small diverticulum extending from the LEFT anterior bladder. Stomach/Bowel: Stomach, small-bowel, cecum normal. Appendix is not clearly identified; however there are no secondary signs of acute appendicitis. On comparison exam the appendix was just beneath the gallbladder. Ascending, transverse, descending colon appear normal. There is some haziness to the mesentery. There is little intra-abdominal fat. These findings make evaluation of bowel difficult. Vascular/Lymphatic: Abdominal aorta is normal caliber with atherosclerotic calcification. There is no retroperitoneal or periportal lymphadenopathy. No pelvic lymphadenopathy. Reproductive: Prostate normal Other: No free fluid or free air Musculoskeletal: No aggressive osseous lesion. IMPRESSION: 1. Distended gallbladder with pericholecystic fluid is most consistent with acute cholecystitis (calculus or a calculus). No biliary duct dilatation. 2. Appendix is not identified. On the comparison exam, the appendix was just beneath the gallbladder therefore cannot exclude secondary gallbladder inflammation from appendicitis, although less favored. 3. Pancreas appears normal. Small periampullary duodenum diverticulum. 4. Bilateral nonobstructing renal calculi. Electronically Signed   By: Suzy Bouchard M.D.   On: 06/25/2018 13:54   US Abdomen Limited Ruq  Result Date: 06/25/2018 CLINICAL DATA:  Right upper quadrant pain EXAM: ULTRASOUND ABDOMEN LIMITED  RIGHT UPPER QUADRANT COMPARISON:  CT 06/25/2018 FINDINGS: Gallbladder: Small to moderate sludge within the gallbladder, punctate echodense foci within the sludge, questionable for punctate stones. Positive sonographic Murphy. Increased wall thickness at 7.2 mm with vascularity. Common bile duct: Diameter: Slightly enlarged at 7 mm Liver: Septated cyst in the liver measuring 4.8 x 4.9 x 4.4 cm. Additional smaller cyst in the posterior right hepatic lobe measuring 3 x 3.2 x 1.8 cm. Trace perihepatic fluid. Portal vein is patent on color Doppler imaging with normal direction of blood flow towards the liver. IMPRESSION: 1. Dilated gallbladder with moderate sludge, vascular wall thickening, and positive sonographic Murphy sign, findings are consistent with an acute cholecystitis. 2. Slightly enlarged common bile duct at 7 mm, correlate with LFT with follow-up MRCP as indicated 3. Small amount of perihepatic free fluid.  Cysts within the liver Electronically Signed   By: Donavan Foil M.D.   On: 06/25/2018 15:11    EKG: Independently reviewed.  None performed  Assessment/Plan Active Problems:   Meniere's syndrome   MDS (myelodysplastic syndrome) (HCC)   Hypothyroidism   Acute cholecystitis    #)  Acute cholecystitis: Suspect most likely acalculus.  At this time patient reports that his prognosis is 2 to 4 months per [facility].  It is not clear that the patient would benefit from a delayed cholecystectomy as an outpatient of a percutaneous cholecystotomy tube was placed during this hospitalization.  Likely a percutaneous cholecystostomy tube would be destination so to speak due to his limited prognosis. -Follow blood cultures obtained 06/17/2018 -Continue IV Zosyn - IV fluids - N.p.o. for HIDA scan and cholecystostomy tube -IV pain control -Interventional radiology consult, will preform hida scan and consider placement of cholecystostomy tube -General surgery consulted  #)  Hypothyroidism: -Continue levothyroxine 175 mcg  #) MDS transformed into acute leukemia: -Supportive transfusions - Symptomatic management  #) Nocturia: -Continue DDAVP  Fluids: Gentle IV fluids Elect lites: Monitor and supplement Nutrition: Regular diet  Prophylaxis: thrombocytopenic  Disposition: Pending discussion with IR by uppercase cholecystotomy tube  DO NOT RESUSCITATE    Cristy Folks MD [facility]   If 7PM-7AM, please contact night-coverage [email or web address] Password [facility]  06/25/2018, 3:42 PM

## 2018-06-25 NOTE — ED Notes (Signed)
ED TO INPATIENT HANDOFF REPORT  Name/Age/Gender Dean Stanley 84 y.o. male  Code Status Code Status History    Date Active Date Inactive Code Status Order ID Comments User Context   12/05/2017 1119 12/09/2017 1636 DNR [id number]  Radene Gunning, NP ED    Questions for Most Recent Historical Code Status (Order [id number])    Question Answer Comment   In the event of cardiac or respiratory ARREST Do not call a "code blue"    In the event of cardiac or respiratory ARREST Do not perform Intubation, CPR, defibrillation or ACLS    In the event of cardiac or respiratory ARREST Use medication by any route, position, wound care, and other measures to relive pain and suffering. May use oxygen, suction and manual treatment of airway obstruction as needed for comfort.       Home/SNF/Other Home  Chief Complaint Abdominal Pain  Level of Care/Admitting Diagnosis ED Disposition    ED Disposition Condition Comment   Admit  Hospital Area: [facility] [[id number]]  Level of Care: Med-Surg [16]  Diagnosis: Acute cholecystitis [575.0.ICD-9-CM]  Admitting Physician: Cristy Folks [[id number]]  Attending Physician: Cristy Folks [[id number]]  Estimated length of stay: past midnight tomorrow  Certification:: I certify this patient will need inpatient services for at least 2 midnights  PT Class (Do Not Modify): Inpatient [101]  PT Acc Code (Do Not Modify): Private [1]       Medical History Past Medical History:  Diagnosis Date  . Anemia   . Cancer (HCC)    myelodysplastic syndrome  . Complication of anesthesia    Reaction to anesthesia " things went haywire around me"  . Do not resuscitate 11/04/2015  . Hearing impairment    wears hearing left ear  . History of ITP 1960  . Hypothyroidism   . Meniere's syndrome 1980s  . Port catheter in place 12/23/2015    Allergies Allergies  Allergen Reactions  . Percocet [Oxycodone-Acetaminophen] Nausea And Vomiting  .  Hydrocodone Nausea And Vomiting  . Oxycodone Nausea And Vomiting    IV Location/Drains/Wounds Patient Lines/Drains/Airways Status   Active Line/Drains/Airways    Name:   Placement date:   Placement time:   Site:   Days:   Peripheral IV 06/25/18 Right Antecubital   06/25/18    1100    Antecubital   less than 1          Labs/Imaging Results for orders placed or performed during the hospital encounter of 06/25/18 (from the past 48 hour(s))  Urinalysis, Routine w reflex microscopic     Status: Abnormal   Collection Time: 06/25/18 10:23 AM  Result Value Ref Range   Color, Urine AMBER (A) YELLOW    Comment: BIOCHEMICALS MAY BE AFFECTED BY COLOR   APPearance CLEAR CLEAR   Specific Gravity, Urine 1.021 1.005 - 1.030   pH 6.0 5.0 - 8.0   Glucose, UA NEGATIVE NEGATIVE mg/dL   Hgb urine dipstick NEGATIVE NEGATIVE   Bilirubin Urine NEGATIVE NEGATIVE   Ketones, ur NEGATIVE NEGATIVE mg/dL   Protein, ur NEGATIVE NEGATIVE mg/dL   Nitrite NEGATIVE NEGATIVE   Leukocytes, UA NEGATIVE NEGATIVE    Comment: Performed at [facility], [place] [address] [place], [place] [address]  POC occult blood, ED Provider will collect     Status: Abnormal   Collection Time: 06/25/18 10:38 AM  Result Value Ref Range   Fecal Occult Bld POSITIVE (A) NEGATIVE  Comprehensive metabolic panel  Status: Abnormal   Collection Time: 06/25/18 10:58 AM  Result Value Ref Range   Sodium 138 135 - 145 mmol/L   Potassium 3.7 3.5 - 5.1 mmol/L   Chloride 109 98 - 111 mmol/L   CO2 23 22 - 32 mmol/L   Glucose, Bld 121 (H) 70 - 99 mg/dL   BUN 14 8 - 23 mg/dL   Creatinine, Ser 0.70 0.61 - 1.24 mg/dL   Calcium 8.1 (L) 8.9 - 10.3 mg/dL   Total Protein 7.4 6.5 - 8.1 g/dL   Albumin 3.3 (L) 3.5 - 5.0 g/dL   AST 18 15 - 41 U/L   ALT 15 0 - 44 U/L   Alkaline Phosphatase 58 38 - 126 U/L   Total Bilirubin 1.2 0.3 - 1.2 mg/dL   GFR calc non Af Amer >60 >60 mL/min   GFR calc Af Amer >60 >60 mL/min    Comment:  (NOTE) The eGFR has been calculated using the CKD EPI equation. This calculation has not been validated in all clinical situations. eGFR's persistently <60 mL/min signify possible Chronic Kidney Disease.    Anion gap 6 5 - 15    Comment: Performed at [facility], [place] [address] [place], [place] [address]  Lipase, blood     Status: None   Collection Time: 06/25/18 10:58 AM  Result Value Ref Range   Lipase 39 11 - 51 U/L    Comment: Performed at [facility], [place] [address] [place], [place] [address]  CBC with Diff     Status: Abnormal   Collection Time: 06/25/18 10:58 AM  Result Value Ref Range   WBC 2.3 (L) 4.0 - 10.5 K/uL   RBC 2.72 (L) 4.22 - 5.81 MIL/uL   Hemoglobin 7.9 (L) 13.0 - 17.0 g/dL   HCT 24.2 (L) 39.0 - 52.0 %   MCV 89.0 78.0 - 100.0 fL   MCH 29.0 26.0 - 34.0 pg   MCHC 32.6 30.0 - 36.0 g/dL   RDW 16.8 (H) 11.5 - 15.5 %   Platelets 79 (L) 150 - 400 K/uL    Comment: RESULT REPEATED AND VERIFIED SPECIMEN CHECKED FOR CLOTS PLATELET COUNT CONFIRMED BY SMEAR    Neutrophils Relative % 50 %   Lymphocytes Relative 27 %   Monocytes Relative 22 %   Eosinophils Relative 0 %   Basophils Relative 1 %   Neutro Abs 1.2 (L) 1.7 - 7.7 K/uL   Lymphs Abs 0.6 (L) 0.7 - 4.0 K/uL   Monocytes Absolute 0.5 0.1 - 1.0 K/uL   Eosinophils Absolute 0.0 0.0 - 0.7 K/uL   Basophils Absolute 0.0 0.0 - 0.1 K/uL   Smear Review MORPHOLOGY UNREMARKABLE     Comment: Performed at [facility], [place] [address], [place], [place] [address]  I-Stat CG4 Lactic Acid, ED     Status: None   Collection Time: 06/25/18 11:04 AM  Result Value Ref Range   Lactic Acid, Venous 1.44 0.5 - 1.9 mmol/L  I-Stat CG4 Lactic Acid, ED     Status: Abnormal   Collection Time: 06/25/18  3:20 PM  Result Value Ref Range   Lactic Acid, Venous 0.46 (L) 0.5 - 1.9 mmol/L   Ct Abdomen Pelvis W Contrast  Result Date: 06/25/2018 CLINICAL DATA:  Abdominal pain. Diarrhea.  Constipation. RIGHT lower quadrant pain. Dark stool. Neutropenia EXAM: CT ABDOMEN AND PELVIS WITH CONTRAST TECHNIQUE: Multidetector CT imaging of the abdomen and pelvis was performed using the standard protocol following bolus  administration of intravenous contrast. CONTRAST:  178m ISOVUE-300 IOPAMIDOL (ISOVUE-300) INJECTION 61% COMPARISON:  CT 03/11/2017 FINDINGS: Lower chest: Focus of scattered small peripheral focus of consolidation air bronchograms in the peripheral RIGHT middle lobe (image 31/6). Diffuse increase in interlobular septal thickening. Hepatobiliary: Several large hepatic cysts noted. There is new distention of the gallbladder to 5.2 cm. There is pericholecystic fluid along the distended gallbladder. No radiodense gallstones present. There is mild enhancement of the cystic duct (image 40/2). No dilatation of the common bile duct. Pancreas: Normal pancreatic duct. No pancreatic inflammation. Moderate size periampullary duodenum diverticulum noted measuring 3 cm (image 38/2). Spleen: Normal spleen Adrenals/urinary tract: Adrenal glands normal. Several nonobstructing calculi in the RIGHT kidney and LEFT kidney. Ureters bladder normal. No obstructive uropathy. Small diverticulum extending from the LEFT anterior bladder. Stomach/Bowel: Stomach, small-bowel, cecum normal. Appendix is not clearly identified; however there are no secondary signs of acute appendicitis. On comparison exam the appendix was just beneath the gallbladder. Ascending, transverse, descending colon appear normal. There is some haziness to the mesentery. There is little intra-abdominal fat. These findings make evaluation of bowel difficult. Vascular/Lymphatic: Abdominal aorta is normal caliber with atherosclerotic calcification. There is no retroperitoneal or periportal lymphadenopathy. No pelvic lymphadenopathy. Reproductive: Prostate normal Other: No free fluid or free air Musculoskeletal: No aggressive osseous lesion. IMPRESSION:  1. Distended gallbladder with pericholecystic fluid is most consistent with acute cholecystitis (calculus or a calculus). No biliary duct dilatation. 2. Appendix is not identified. On the comparison exam, the appendix was just beneath the gallbladder therefore cannot exclude secondary gallbladder inflammation from appendicitis, although less favored. 3. Pancreas appears normal. Small periampullary duodenum diverticulum. 4. Bilateral nonobstructing renal calculi. Electronically Signed   By: SSuzy BouchardM.D.   On: 06/25/2018 13:54   UKoreaAbdomen Limited Ruq  Result Date: 06/25/2018 CLINICAL DATA:  Right upper quadrant pain EXAM: ULTRASOUND ABDOMEN LIMITED RIGHT UPPER QUADRANT COMPARISON:  CT 06/25/2018 FINDINGS: Gallbladder: Small to moderate sludge within the gallbladder, punctate echodense foci within the sludge, questionable for punctate stones. Positive sonographic Murphy. Increased wall thickness at 7.2 mm with vascularity. Common bile duct: Diameter: Slightly enlarged at 7 mm Liver: Septated cyst in the liver measuring 4.8 x 4.9 x 4.4 cm. Additional smaller cyst in the posterior right hepatic lobe measuring 3 x 3.2 x 1.8 cm. Trace perihepatic fluid. Portal vein is patent on color Doppler imaging with normal direction of blood flow towards the liver. IMPRESSION: 1. Dilated gallbladder with moderate sludge, vascular wall thickening, and positive sonographic Murphy sign, findings are consistent with an acute cholecystitis. 2. Slightly enlarged common bile duct at 7 mm, correlate with LFT with follow-up MRCP as indicated 3. Small amount of perihepatic free fluid.  Cysts within the liver Electronically Signed   By: KDonavan FoilM.D.   On: 06/25/2018 15:11    Pending Labs Unresulted Labs (From admission, onward)    Start     Ordered   06/25/18 1437  Blood culture (routine x 2)  BLOOD CULTURE X 2,   STAT     06/25/18 1436   Signed and Held  Comprehensive metabolic panel  Tomorrow morning,   R      Signed and Held   Signed and Held  CBC  Tomorrow morning,   R     Signed and Held   Signed and Held  Protime-INR  Tomorrow morning,   R     Signed and Held   Signed and Held  APTT  Tomorrow morning,  R     Signed and Held          Vitals/Pain Today's Vitals   06/25/18 1101 06/25/18 1425 06/25/18 1431 06/25/18 1631  BP:   (!) 100/51 (!) 100/52  Pulse:   88 82  Resp:   18 18  Temp:   99.5 F (37.5 C)   TempSrc:   Oral   SpO2:   93% 94%  PainSc: 10-Worst pain ever 9       Isolation Precautions No active isolations  Medications Medications  ondansetron (ZOFRAN) injection 4 mg (4 mg Intravenous Refused 06/25/18 1101)  iopamidol (ISOVUE-300) 61 % injection (has no administration in time range)  piperacillin-tazobactam (ZOSYN) IVPB 3.375 g (3.375 g Intravenous New Bag/Given 06/25/18 1627)  lactated ringers bolus 1,000 mL (0 mLs Intravenous Stopped 06/25/18 1426)  fentaNYL (SUBLIMAZE) injection 50 mcg (50 mcg Intravenous Given 06/25/18 1101)  iopamidol (ISOVUE-300) 61 % injection 100 mL (100 mLs Intravenous Contrast Given 06/25/18 1214)  fentaNYL (SUBLIMAZE) injection 50 mcg (50 mcg Intravenous Given 06/25/18 1425)  0.9 %  sodium chloride infusion ( Intravenous New Bag/Given 06/25/18 1423)    Mobility walks with device

## 2018-06-25 NOTE — Progress Notes (Signed)
CSW received consult from EDP for "end of life," resources or assistance. CSW inquired if patient was to be admitted or discharged to a facility, current plan was for patient to discharge home. CSW made aware patient diagnosed with a terminal illness but likely has several months left to live.   CSW signed off on ED consult as social work needs were not made apparent at this time. Please reconsult if social work needs arise.  Stephanie Acre, [place] Social Worker [phone]

## 2018-06-25 NOTE — ED Provider Notes (Signed)
[facility] DEPT Provider Note   CSN: [id number] Arrival date & time: 06/25/18  1016     History   Chief Complaint Chief Complaint  Patient presents with  . Abdominal Pain    HPI Constant Mandeville is a 84 y.o. male.  The history is provided by the patient.  Abdominal Pain   This is a new problem. The current episode started 2 days ago. The problem occurs constantly. The problem has not changed since onset.The pain is associated with an unknown factor. The pain is located in the RLQ, LLQ and generalized abdominal region. The quality of the pain is dull and aching. The pain is at a severity of 5/10. The pain is moderate. Associated symptoms include diarrhea, nausea, constipation (prior to diarrhea) and dysuria. Pertinent negatives include anorexia, fever, belching, flatus, hematochezia, melena, vomiting, hematuria, headaches, arthralgias and myalgias. The symptoms are aggravated by palpation. Nothing relieves the symptoms. Past workup does not include CT scan. Past medical history comments: Leukemia requiring blood transfusions, diverticulitis .    Past Medical History:  Diagnosis Date  . Anemia   . Cancer (HCC)    myelodysplastic syndrome  . Complication of anesthesia    Reaction to anesthesia " things went haywire around me"  . Do not resuscitate 11/04/2015  . Hearing impairment    wears hearing left ear  . History of ITP 1960  . Hypothyroidism   . Meniere's syndrome 1980s  . Port catheter in place 12/23/2015    Patient Active Problem List   Diagnosis Date Noted  . Hypothyroidism 06/25/2018  . Acute cholecystitis 06/25/2018  . Port catheter in place 12/23/2015  . Do not resuscitate 11/04/2015  . Rash 10/09/2015  . MDS (myelodysplastic syndrome) ([place]) 09/10/2015  . Neutropenia ([place]) 08/27/2015  . Leukocytopenia 08/27/2015  . Meniere's syndrome   . Deficiency anemia 12/18/2014    Past Surgical History:  Procedure Laterality Date  .  cataract Right 2011  . CIRCUMCISION    . COLONOSCOPY    . cortisone injections to back x 6    . ORIF ORBITAL FRACTURE Left 2000   2 fractures orbital platform        Home Medications    Prior to Admission medications   Medication Sig Start Date End Date Taking? Authorizing Provider  calcipotriene (DOVONOX) 0.005 % ointment Apply topically 2 (two) times daily.   Yes [provider]  Darbepoetin Alfa (ARANESP) 300 MCG/0.6ML SOSY injection Inject 300 mcg into the skin every 30 (thirty) days. As needed based on blood counts   Yes [provider]  desmopressin (DDAVP) 0.2 MG tablet Take 0.2 mg by mouth daily.   Yes [provider]  finasteride (PROPECIA) 1 MG tablet Take 1 mg by mouth daily with breakfast.    Yes [provider]  levothyroxine (SYNTHROID, LEVOTHROID) 175 MCG tablet Take 175 mcg by mouth at bedtime.    Yes [provider]  Lutein 40 MG CAPS Take 40 mg by mouth daily with breakfast.  10/09/15  Yes [provider]  Multiple Vitamins-Minerals (CENTRUM ADULTS PO) Take 1 tablet by mouth daily.    Yes [provider]  omeprazole (PRILOSEC) 20 MG capsule Take 20 mg by mouth daily.   Yes [provider]  traMADol (ULTRAM) 50 MG tablet Take 50 mg by mouth every 6 (six) hours as needed.   Yes [provider]  triamcinolone (KENALOG) 0.025 % cream Apply 1 application topically 2 (two) times daily as needed (  for inflammation).    Yes [provider]    Family History Family History  Problem Relation Age of Onset  . Throat cancer Other   . Pancreatic cancer Father   . Colon cancer Brother     Social History Social History   Tobacco Use  . Smoking status: Never Smoker  . Smokeless tobacco: Never Used  Substance Use Topics  . Alcohol use: Yes    Comment: Occasional  . Drug use: No     Allergies   Percocet [oxycodone-acetaminophen]; Hydrocodone; and Oxycodone   Review of  Systems Review of Systems  Constitutional: Negative for chills and fever.  HENT: Negative for ear pain and sore throat.   Eyes: Negative for pain and visual disturbance.  Respiratory: Negative for cough and shortness of breath.   Cardiovascular: Negative for chest pain and palpitations.  Gastrointestinal: Positive for abdominal pain, constipation (prior to diarrhea), diarrhea and nausea. Negative for anorexia, flatus, hematochezia, melena and vomiting.  Genitourinary: Positive for dysuria. Negative for hematuria.  Musculoskeletal: Negative for arthralgias, back pain and myalgias.  Skin: Negative for color change and rash.  Neurological: Negative for seizures, syncope and headaches.  All other systems reviewed and are negative.    Physical Exam Updated Vital Signs  ED Triage Vitals  Enc Vitals Group     BP 06/25/18 1026 112/73     Pulse Rate 06/25/18 1026 80     Resp 06/25/18 1026 16     Temp 06/25/18 1026 97.9 F (36.6 C)     Temp Source 06/25/18 1026 Oral     SpO2 06/25/18 1026 98 %     Weight --      Height --      Head Circumference --      Peak Flow --      Pain Score 06/25/18 1101 10     Pain Loc --      Pain Edu? --      Excl. in [place]? --     Physical Exam  Constitutional: He appears well-developed and well-nourished. He appears distressed.  HENT:  Head: Normocephalic and atraumatic.  Mouth/Throat: No oropharyngeal exudate.  Eyes: Pupils are equal, round, and reactive to light. Conjunctivae and EOM are normal.  Neck: Neck supple.  Cardiovascular: Normal rate, regular rhythm, normal heart sounds and intact distal pulses.  No murmur heard. Pulmonary/Chest: Effort normal and breath sounds normal. No respiratory distress.  Abdominal: Soft. There is tenderness in the right upper quadrant, right lower quadrant, suprapubic area and left lower quadrant. There is guarding and positive Murphy's sign. There is no rigidity and no CVA tenderness.  Genitourinary: Rectum normal.  Rectal exam shows guaiac negative stool.  Musculoskeletal: He exhibits no edema.  Neurological: He is alert.  Skin: Skin is warm and dry. Capillary refill takes less than 2 seconds.  Psychiatric: He has a normal mood and affect.  Nursing note and vitals reviewed.    ED Treatments / Results  Labs (all labs ordered are listed, but only abnormal results are displayed) Labs Reviewed  COMPREHENSIVE METABOLIC PANEL - Abnormal; Notable for the following components:      Result Value   Glucose, Bld 121 (*)    Calcium 8.1 (*)    Albumin 3.3 (*)    All other components within normal limits  CBC WITH DIFFERENTIAL/PLATELET - Abnormal; Notable for the following components:   WBC 2.3 (*)    RBC 2.72 (*)    Hemoglobin 7.9 (*)    HCT  24.2 (*)    RDW 16.8 (*)    Platelets 79 (*)    Neutro Abs 1.2 (*)    Lymphs Abs 0.6 (*)    All other components within normal limits  URINALYSIS, ROUTINE W REFLEX MICROSCOPIC - Abnormal; Notable for the following components:   Color, Urine AMBER (*)    All other components within normal limits  POC OCCULT BLOOD, ED - Abnormal; Notable for the following components:   Fecal Occult Bld POSITIVE (*)    All other components within normal limits  I-STAT CG4 LACTIC ACID, ED - Abnormal; Notable for the following components:   Lactic Acid, Venous 0.46 (*)    All other components within normal limits  CULTURE, BLOOD (ROUTINE X 2)  CULTURE, BLOOD (ROUTINE X 2)  LIPASE, BLOOD  I-STAT CG4 LACTIC ACID, ED    EKG None  Radiology Ct Abdomen Pelvis W Contrast  Result Date: 06/25/2018 CLINICAL DATA:  Abdominal pain. Diarrhea. Constipation. RIGHT lower quadrant pain. Dark stool. Neutropenia EXAM: CT ABDOMEN AND PELVIS WITH CONTRAST TECHNIQUE: Multidetector CT imaging of the abdomen and pelvis was performed using the standard protocol following bolus administration of intravenous contrast. CONTRAST:  19mL ISOVUE-300 IOPAMIDOL (ISOVUE-300) INJECTION 61% COMPARISON:  CT  03/11/2017 FINDINGS: Lower chest: Focus of scattered small peripheral focus of consolidation air bronchograms in the peripheral RIGHT middle lobe (image 31/6). Diffuse increase in interlobular septal thickening. Hepatobiliary: Several large hepatic cysts noted. There is new distention of the gallbladder to 5.2 cm. There is pericholecystic fluid along the distended gallbladder. No radiodense gallstones present. There is mild enhancement of the cystic duct (image 40/2). No dilatation of the common bile duct. Pancreas: Normal pancreatic duct. No pancreatic inflammation. Moderate size periampullary duodenum diverticulum noted measuring 3 cm (image 38/2). Spleen: Normal spleen Adrenals/urinary tract: Adrenal glands normal. Several nonobstructing calculi in the RIGHT kidney and LEFT kidney. Ureters bladder normal. No obstructive uropathy. Small diverticulum extending from the LEFT anterior bladder. Stomach/Bowel: Stomach, small-bowel, cecum normal. Appendix is not clearly identified; however there are no secondary signs of acute appendicitis. On comparison exam the appendix was just beneath the gallbladder. Ascending, transverse, descending colon appear normal. There is some haziness to the mesentery. There is little intra-abdominal fat. These findings make evaluation of bowel difficult. Vascular/Lymphatic: Abdominal aorta is normal caliber with atherosclerotic calcification. There is no retroperitoneal or periportal lymphadenopathy. No pelvic lymphadenopathy. Reproductive: Prostate normal Other: No free fluid or free air Musculoskeletal: No aggressive osseous lesion. IMPRESSION: 1. Distended gallbladder with pericholecystic fluid is most consistent with acute cholecystitis (calculus or a calculus). No biliary duct dilatation. 2. Appendix is not identified. On the comparison exam, the appendix was just beneath the gallbladder therefore cannot exclude secondary gallbladder inflammation from appendicitis, although less  favored. 3. Pancreas appears normal. Small periampullary duodenum diverticulum. 4. Bilateral nonobstructing renal calculi. Electronically Signed   By: Suzy Bouchard M.D.   On: 06/25/2018 13:54   US Abdomen Limited Ruq  Result Date: 06/25/2018 CLINICAL DATA:  Right upper quadrant pain EXAM: ULTRASOUND ABDOMEN LIMITED RIGHT UPPER QUADRANT COMPARISON:  CT 06/25/2018 FINDINGS: Gallbladder: Small to moderate sludge within the gallbladder, punctate echodense foci within the sludge, questionable for punctate stones. Positive sonographic Murphy. Increased wall thickness at 7.2 mm with vascularity. Common bile duct: Diameter: Slightly enlarged at 7 mm Liver: Septated cyst in the liver measuring 4.8 x 4.9 x 4.4 cm. Additional smaller cyst in the posterior right hepatic lobe measuring 3 x 3.2 x 1.8 cm. Trace perihepatic fluid. Portal  vein is patent on color Doppler imaging with normal direction of blood flow towards the liver. IMPRESSION: 1. Dilated gallbladder with moderate sludge, vascular wall thickening, and positive sonographic Murphy sign, findings are consistent with an acute cholecystitis. 2. Slightly enlarged common bile duct at 7 mm, correlate with LFT with follow-up MRCP as indicated 3. Small amount of perihepatic free fluid.  Cysts within the liver Electronically Signed   By: Donavan Foil M.D.   On: 06/25/2018 15:11    Procedures Procedures (including critical care time)  Medications Ordered in ED Medications  ondansetron (ZOFRAN) injection 4 mg (4 mg Intravenous Refused 06/25/18 1101)  iopamidol (ISOVUE-300) 61 % injection (has no administration in time range)  piperacillin-tazobactam (ZOSYN) IVPB 3.375 g (has no administration in time range)  lactated ringers bolus 1,000 mL (0 mLs Intravenous Stopped 06/25/18 1426)  fentaNYL (SUBLIMAZE) injection 50 mcg (50 mcg Intravenous Given 06/25/18 1101)  iopamidol (ISOVUE-300) 61 % injection 100 mL (100 mLs Intravenous Contrast Given 06/25/18 1214)    fentaNYL (SUBLIMAZE) injection 50 mcg (50 mcg Intravenous Given 06/25/18 1425)  0.9 %  sodium chloride infusion ( Intravenous New Bag/Given 06/25/18 1423)     Initial Impression / Assessment and Plan / ED Course  I have reviewed the triage vital signs and the nursing notes.  Pertinent labs & imaging results that were available during my care of the patient were reviewed by me and considered in my medical decision making (see chart for details).     Blanchard Willhite is an 84 year old male with history of leukemia no longer on immunotherapy but continues to get blood transfusions as needed who presents to the ED with abdominal pain.  Patient with unremarkable vitals.  No fever.  Patient with right-sided abdominal pain for the last 1 to 2 days.  Has had some nausea and vomiting.  Patient was constipated earlier in the week and now has had some diarrhea.  No recent antibiotic use.  No suspicious food intake.  Patient thought that his stool was black however he has no gross melena or hematochezia on rectal exam.  No concern for GI bleed.  Patient tender diffusely on abdominal exam but mostly in the right lower quadrant and right upper quadrant.  Will obtain CT scan to further evaluate.  Lab work collected including urine studies.  Will give patient IV fluids, IV fentanyl, IV Zofran and re-evaluate.  Patient with CT scan that shows possible acute cholecystitis.  Ultrasound was ordered that confirmed diagnosis.  Patient however has no significant elevation in liver enzymes or gallbladder enzymes.  Lactic acid is normal.  He has chronic leukopenia.  Hemoglobin is improved.  No signs of urinary tract infection.  No other significant electrolyte abnormality, kidney injury.  Dr. Barry Dienes was consulted w/ general surgery for acute cholecystitis to see if there is any surgical intervention needed/further recommendations, patient to be admitted to hospitalist service for further care as believe patient will likely  need IR treatment/non-surgical treatment. May need MRCP.  Blood cultures were collected and patient was started on IV Zosyn.  Started on IV maintenance fluids.  Patient remained hemodynamically stable throughout my care.   Final Clinical Impressions(s) / ED Diagnoses   Final diagnoses:  RUQ pain  Acute cholecystitis    ED Discharge Orders    None       Lennice Sites, DO 06/25/18 1601

## 2018-06-25 NOTE — Consult Note (Signed)
Reason for Consult:Abdominal pain Referring Physician: Ronnald Nian DO  Dean Stanley is an 84 y.o. male.  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  He is in reasonable shape otherwise.  He gets around reasonably well.    He started having lower abdominal pain around 4 days ago and felt like he needed to have a bowel movement.  He had to strain and did have one and had some improvement in symptoms.  He had several large bowel movements over the next two days and had near resolution of symptoms.  However, yesterday he started having more severe RLQ pain and now diffuse abdominal pain.  He denies nausea or vomiting.  Yesterday he did not have much of an appetite but feels more hungry today.  He did not notice that eating made the pain better or worse.  He denies fevers, but had some chills this AM.    Past Medical History:  Diagnosis Date  . Anemia   . Cancer (HCC)    myelodysplastic syndrome  . Complication of anesthesia    Reaction to anesthesia " things went haywire around me"  . Do not resuscitate 11/04/2015  . Hearing impairment    wears hearing left ear  . History of ITP 1960  . Hypothyroidism   . Meniere's syndrome 1980s  . Port catheter in place 12/23/2015    Past Surgical History:  Procedure Laterality Date  . cataract Right 2011  . CIRCUMCISION    . COLONOSCOPY    . cortisone injections to back x 6    . ORIF ORBITAL FRACTURE Left 2000   2 fractures orbital platform    Family History  Problem Relation Age of Onset  . Throat cancer Other   . Pancreatic cancer Father   . Colon cancer Brother     Social History:  reports that he has never smoked. He has never used smokeless tobacco. He reports that he drinks alcohol. He reports that he does not use drugs.  Allergies:  Allergies  Allergen Reactions  . Percocet [Oxycodone-Acetaminophen] Nausea And  Vomiting  . Hydrocodone Nausea And Vomiting  . Oxycodone Nausea And Vomiting    Medications:  Current Meds  Medication Sig  . calcipotriene (DOVONOX) 0.005 % ointment Apply topically 2 (two) times daily.  . Darbepoetin Alfa (ARANESP) 300 MCG/0.6ML SOSY injection Inject 300 mcg into the skin every 30 (thirty) days. As needed based on blood counts  . desmopressin (DDAVP) 0.2 MG tablet Take 0.2 mg by mouth daily.  . finasteride (PROPECIA) 1 MG tablet Take 1 mg by mouth daily with breakfast.   . levothyroxine (SYNTHROID, LEVOTHROID) 175 MCG tablet Take 175 mcg by mouth at bedtime.   . Lutein 40 MG CAPS Take 40 mg by mouth daily with breakfast.   . Multiple Vitamins-Minerals (CENTRUM ADULTS PO) Take 1 tablet by mouth daily.   Marland Kitchen omeprazole (PRILOSEC) 20 MG capsule Take 20 mg by mouth daily.  . traMADol (ULTRAM) 50 MG tablet Take 50 mg by mouth every 6 (six) hours as needed.  . triamcinolone (KENALOG) 0.025 % cream Apply 1 application topically 2 (two) times daily as needed (for inflammation).      Results for orders placed or performed during the hospital encounter of 06/25/18 (from the past 48 hour(s))  Urinalysis, Routine w reflex microscopic     Status: Abnormal   Collection Time: 06/25/18 10:23  AM  Result Value Ref Range   Color, Urine AMBER (A) YELLOW    Comment: BIOCHEMICALS MAY BE AFFECTED BY COLOR   APPearance CLEAR CLEAR   Specific Gravity, Urine 1.021 1.005 - 1.030   pH 6.0 5.0 - 8.0   Glucose, UA NEGATIVE NEGATIVE mg/dL   Hgb urine dipstick NEGATIVE NEGATIVE   Bilirubin Urine NEGATIVE NEGATIVE   Ketones, ur NEGATIVE NEGATIVE mg/dL   Protein, ur NEGATIVE NEGATIVE mg/dL   Nitrite NEGATIVE NEGATIVE   Leukocytes, UA NEGATIVE NEGATIVE    Comment: Performed at [facility] [address], [place],  [place] [address]  POC occult blood, ED Provider will collect     Status: Abnormal   Collection Time: 06/25/18 10:38 AM  Result Value Ref Range   Fecal Occult Bld  POSITIVE (A) NEGATIVE  Comprehensive metabolic panel     Status: Abnormal   Collection Time: 06/25/18 10:58 AM  Result Value Ref Range   Sodium 138 135 - 145 mmol/L   Potassium 3.7 3.5 - 5.1 mmol/L   Chloride 109 98 - 111 mmol/L   CO2 23 22 - 32 mmol/L   Glucose, Bld 121 (H) 70 - 99 mg/dL   BUN 14 8 - 23 mg/dL   Creatinine, Ser 0.70 0.61 - 1.24 mg/dL   Calcium 8.1 (L) 8.9 - 10.3 mg/dL   Total Protein 7.4 6.5 - 8.1 g/dL   Albumin 3.3 (L) 3.5 - 5.0 g/dL   AST 18 15 - 41 U/L   ALT 15 0 - 44 U/L   Alkaline Phosphatase 58 38 - 126 U/L   Total Bilirubin 1.2 0.3 - 1.2 mg/dL   GFR calc non Af Amer >60 >60 mL/min   GFR calc Af Amer >60 >60 mL/min    Comment: (NOTE) The eGFR has been calculated using the CKD EPI equation. This calculation has not been validated in all clinical situations. eGFR's persistently <60 mL/min signify possible Chronic Kidney Disease.    Anion gap 6 5 - 15    Comment: Performed at [facility], [place] [address], [place], [place] [address]  Lipase, blood     Status: None   Collection Time: 06/25/18 10:58 AM  Result Value Ref Range   Lipase 39 11 - 51 U/L    Comment: Performed at [facility], [place] [address], [place],  [address]  CBC with Diff     Status: Abnormal   Collection Time: 06/25/18 10:58 AM  Result Value Ref Range   WBC 2.3 (L) 4.0 - 10.5 K/uL   RBC 2.72 (L) 4.22 - 5.81 MIL/uL   Hemoglobin 7.9 (L) 13.0 - 17.0 g/dL   HCT 24.2 (L) 39.0 - 52.0 %   MCV 89.0 78.0 - 100.0 fL   MCH 29.0 26.0 - 34.0 pg   MCHC 32.6 30.0 - 36.0 g/dL   RDW 16.8 (H) 11.5 - 15.5 %   Platelets 79 (L) 150 - 400 K/uL    Comment: RESULT REPEATED AND VERIFIED SPECIMEN CHECKED FOR CLOTS PLATELET COUNT CONFIRMED BY SMEAR    Neutrophils Relative % 50 %   Lymphocytes Relative 27 %   Monocytes Relative 22 %   Eosinophils Relative 0 %   Basophils Relative 1 %   Neutro Abs 1.2 (L) 1.7 - 7.7 K/uL   Lymphs Abs 0.6 (L) 0.7 - 4.0 K/uL    Monocytes Absolute 0.5 0.1 - 1.0 K/uL   Eosinophils Absolute 0.0 0.0 - 0.7 K/uL   Basophils Absolute  0.0 0.0 - 0.1 K/uL   Smear Review MORPHOLOGY UNREMARKABLE     Comment: Performed at [facility], [place] [address], [place], [place] [address]  I-Stat CG4 Lactic Acid, ED     Status: None   Collection Time: 06/25/18 11:04 AM  Result Value Ref Range   Lactic Acid, Venous 1.44 0.5 - 1.9 mmol/L  I-Stat CG4 Lactic Acid, ED     Status: Abnormal   Collection Time: 06/25/18  3:20 PM  Result Value Ref Range   Lactic Acid, Venous 0.46 (L) 0.5 - 1.9 mmol/L    Ct Abdomen Pelvis W Contrast  Result Date: 06/25/2018 CLINICAL DATA:  Abdominal pain. Diarrhea. Constipation. RIGHT lower quadrant pain. Dark stool. Neutropenia EXAM: CT ABDOMEN AND PELVIS WITH CONTRAST TECHNIQUE: Multidetector CT imaging of the abdomen and pelvis was performed using the standard protocol following bolus administration of intravenous contrast. CONTRAST:  189m ISOVUE-300 IOPAMIDOL (ISOVUE-300) INJECTION 61% COMPARISON:  CT 03/11/2017 FINDINGS: Lower chest: Focus of scattered small peripheral focus of consolidation air bronchograms in the peripheral RIGHT middle lobe (image 31/6). Diffuse increase in interlobular septal thickening. Hepatobiliary: Several large hepatic cysts noted. There is new distention of the gallbladder to 5.2 cm. There is pericholecystic fluid along the distended gallbladder. No radiodense gallstones present. There is mild enhancement of the cystic duct (image 40/2). No dilatation of the common bile duct. Pancreas: Normal pancreatic duct. No pancreatic inflammation. Moderate size periampullary duodenum diverticulum noted measuring 3 cm (image 38/2). Spleen: Normal spleen Adrenals/urinary tract: Adrenal glands normal. Several nonobstructing calculi in the RIGHT kidney and LEFT kidney. Ureters bladder normal. No obstructive uropathy. Small diverticulum extending from the LEFT anterior bladder.  Stomach/Bowel: Stomach, small-bowel, cecum normal. Appendix is not clearly identified; however there are no secondary signs of acute appendicitis. On comparison exam the appendix was just beneath the gallbladder. Ascending, transverse, descending colon appear normal. There is some haziness to the mesentery. There is little intra-abdominal fat. These findings make evaluation of bowel difficult. Vascular/Lymphatic: Abdominal aorta is normal caliber with atherosclerotic calcification. There is no retroperitoneal or periportal lymphadenopathy. No pelvic lymphadenopathy. Reproductive: Prostate normal Other: No free fluid or free air Musculoskeletal: No aggressive osseous lesion. IMPRESSION: 1. Distended gallbladder with pericholecystic fluid is most consistent with acute cholecystitis (calculus or a calculus). No biliary duct dilatation. 2. Appendix is not identified. On the comparison exam, the appendix was just beneath the gallbladder therefore cannot exclude secondary gallbladder inflammation from appendicitis, although less favored. 3. Pancreas appears normal. Small periampullary duodenum diverticulum. 4. Bilateral nonobstructing renal calculi. Electronically Signed   By: SSuzy BouchardM.D.   On: 06/25/2018 13:54   UKoreaAbdomen Limited Ruq  Result Date: 06/25/2018 CLINICAL DATA:  Right upper quadrant pain EXAM: ULTRASOUND ABDOMEN LIMITED RIGHT UPPER QUADRANT COMPARISON:  CT 06/25/2018 FINDINGS: Gallbladder: Small to moderate sludge within the gallbladder, punctate echodense foci within the sludge, questionable for punctate stones. Positive sonographic Murphy. Increased wall thickness at 7.2 mm with vascularity. Common bile duct: Diameter: Slightly enlarged at 7 mm Liver: Septated cyst in the liver measuring 4.8 x 4.9 x 4.4 cm. Additional smaller cyst in the posterior right hepatic lobe measuring 3 x 3.2 x 1.8 cm. Trace perihepatic fluid. Portal vein is patent on color Doppler imaging with normal direction of  blood flow towards the liver. IMPRESSION: 1. Dilated gallbladder with moderate sludge, vascular wall thickening, and positive sonographic Murphy sign, findings are consistent with an acute cholecystitis. 2. Slightly enlarged common bile duct at 7 mm, correlate  with LFT with follow-up MRCP as indicated 3. Small amount of perihepatic free fluid.  Cysts within the liver Electronically Signed   By: Donavan Foil M.D.   On: 06/25/2018 15:11    Review of Systems  All other systems reviewed and are negative.  Blood pressure (!) 104/48, pulse 77, temperature 97.6 F (36.4 C), temperature source Oral, resp. rate 18, SpO2 93 %. Physical Exam  Constitutional: He is oriented to person, place, and time. He appears well-developed and well-nourished. No distress.  HENT:  Head: Normocephalic and atraumatic.  Eyes: Pupils are equal, round, and reactive to light. Conjunctivae and EOM are normal.  Neck: Neck supple. No tracheal deviation present. No thyromegaly present.  Cardiovascular: Normal rate, regular rhythm, normal heart sounds and intact distal pulses.  Respiratory: Effort normal.  GI: Soft. He exhibits no distension. There is tenderness. There is guarding (voluntary guarding RLQ). Rebound: voluntary guarding RLQ.  Musculoskeletal: Normal range of motion.  Neurological: He is alert and oriented to person, place, and time. Coordination normal.  Hard of hearing  Skin: Skin is warm and dry. He is not diaphoretic. No erythema. There is pallor.  Psychiatric: He has a normal mood and affect. His behavior is normal. Judgment and thought content normal.    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   Symptoms and diffuse mild tenderness are not very consistent with gallbladder disease. However, if truly  cholecystitis, would be more likely to recommend perc chole tube.   Will follow.   OK for clear liquids tonight.    Stark Klein 06/25/2018, 6:41 PM

## 2018-06-26 ENCOUNTER — Inpatient Hospital Stay (HOSPITAL_COMMUNITY): Payer: Medicare Other

## 2018-06-26 DIAGNOSIS — R1011 Right upper quadrant pain: Secondary | ICD-10-CM

## 2018-06-26 DIAGNOSIS — H8109 Meniere's disease, unspecified ear: Secondary | ICD-10-CM

## 2018-06-26 LAB — HEMOGLOBIN AND HEMATOCRIT, BLOOD
HEMATOCRIT: 24.4 % — AB (ref 39.0–52.0)
HEMOGLOBIN: 8.1 g/dL — AB (ref 13.0–17.0)

## 2018-06-26 LAB — COMPREHENSIVE METABOLIC PANEL
ALT: 14 U/L (ref 0–44)
AST: 14 U/L — ABNORMAL LOW (ref 15–41)
CO2: 23 mmol/L (ref 22–32)
Chloride: 108 mmol/L (ref 98–111)
Creatinine, Ser: 0.72 mg/dL (ref 0.61–1.24)
GFR calc Af Amer: >60 mL/min (ref >60)
Sodium: 135 mmol/L (ref 135–145)

## 2018-06-26 LAB — PROTIME-INR
INR: 1.49
Prothrombin Time: 17.9 s — ABNORMAL HIGH (ref 11.4–15.2)

## 2018-06-26 LAB — PREPARE RBC (CROSSMATCH)

## 2018-06-26 LAB — COMPREHENSIVE METABOLIC PANEL WITH GFR
Albumin: 2.6 g/dL — ABNORMAL LOW (ref 3.5–5.0)
Alkaline Phosphatase: 47 U/L (ref 38–126)
Anion gap: 4 — ABNORMAL LOW (ref 5–15)
BUN: 12 mg/dL (ref 8–23)
Calcium: 7.4 mg/dL — ABNORMAL LOW (ref 8.9–10.3)
GFR calc non Af Amer: >60 mL/min (ref >60)
Glucose, Bld: 91 mg/dL (ref 70–99)
Potassium: 3.5 mmol/L (ref 3.5–5.1)
Total Bilirubin: 1 mg/dL (ref 0.3–1.2)
Total Protein: 6.1 g/dL — ABNORMAL LOW (ref 6.5–8.1)

## 2018-06-26 LAB — CBC
HCT: 19.8 % — ABNORMAL LOW (ref 39.0–52.0)
Hemoglobin: 6.5 g/dL — CL (ref 13.0–17.0)
MCH: 29.5 pg (ref 26.0–34.0)
MCHC: 32.8 g/dL (ref 30.0–36.0)
MCV: 90 fL (ref 78.0–100.0)
Platelets: 63 10*3/uL — ABNORMAL LOW (ref 150–400)
RBC: 2.2 MIL/uL — ABNORMAL LOW (ref 4.22–5.81)
RDW: 16.8 % — ABNORMAL HIGH (ref 11.5–15.5)
WBC: 1.9 K/uL — ABNORMAL LOW (ref 4.0–10.5)

## 2018-06-26 LAB — APTT: aPTT: 44 s — ABNORMAL HIGH (ref 24–36)

## 2018-06-26 MED ORDER — SODIUM CHLORIDE 0.9% IV SOLUTION
Freq: Once | INTRAVENOUS | Status: AC
  Administered 2018-06-28: 17:00:00 via INTRAVENOUS

## 2018-06-26 MED ORDER — HYDROMORPHONE HCL 1 MG/ML IJ SOLN
0.5000 mg | INTRAMUSCULAR | Status: DC | PRN
  Administered 2018-06-28: 0.5 mg via INTRAVENOUS
  Filled 2018-06-26: qty 0.5

## 2018-06-26 MED ORDER — MORPHINE SULFATE (PF) 4 MG/ML IV SOLN
2.7000 mg | Freq: Once | INTRAVENOUS | Status: AC
  Administered 2018-06-26: 2.7 mg via INTRAVENOUS
  Filled 2018-06-26: qty 1

## 2018-06-26 MED ORDER — TECHNETIUM TC 99M MEBROFENIN IV KIT
5.2700 | PACK | Freq: Once | INTRAVENOUS | Status: AC | PRN
  Administered 2018-06-26: 5.27 via INTRAVENOUS

## 2018-06-26 NOTE — Progress Notes (Signed)
PROGRESS NOTE    Dean Stanley  XIH:[id number] DOB: 04/20/1931 DOA: 06/25/2018 PCP: Lorene Dy, MD   Brief Narrative:84 y.o. 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  Patient reports yesterday he began to have severe left lower quadrant pain.  Pain was sharp and did not radiate.  Patient reports that the pain would wax and wane.  Patient was unable to tolerate p.o. due to decreased appetite and significant amounts of nausea.  Patient did have some diarrhea.  He did not have any emesis.  He denies any cough, congestion, rhinorrhea, fevers, dysuria, constipation.  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  He is followed at [facility] for this primarily.  ED Course: In the ED patient's vitals were unremarkable.  Labs are notable for white count of 2.3, platelets of 79, hemoglobin 7.9.  CMP was fairly unremarkable other than a mild protein gap.  CT scan showed evidence of acute cholecystitis and right upper quadrant ultrasound confirmed this as well as a mildly dilated common bile duct.    Patient seen by general surgery Patient was given IV Zosyn and blood cultures were taken.  General surgery was consulted and the recommendations are pending.   Assessment & Plan:   Active Problems:   Meniere's syndrome   MDS (myelodysplastic syndrome) (HCC)   Hypothyroidism   Acute cholecystitis  1] abdominal pain-CT scan showed-Distended gallbladder with pericholecystic fluid is most consistent with acute cholecystitis (calculus or a calculus). No biliary duct dilatation.  Appendix is not identified. On the comparison exam, the appendix was just beneath the gallbladder therefore cannot exclude secondary gallbladder inflammation  from appendicitis, although less favored.  Pancreas appears normal. Small periampullary duodenum diverticulum.  Bilateral nonobstructing renal calculi.  Patient continues to have severe abdominal pain.  He is seen by general surgery and interventional radiology.  HIDA scan has been ordered results pending.  Tenuous Zosyn for now.  Follow-up all the cultures.  Continue slow IV hydration.  He complains of severe abdominal pain and being hungry.  I will start him on low-dose Dilaudid as tramadol has not been helping him.  2] MDS transformed into acute leukemia followed at [facility].  He reports he was told his prognosis is 2 to 4 months.  However he looks very happy and cheerful and was on the cell phone texting when I walked in.  He still has a good sense of humor.  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  3] nocturia-continue DDAVP.  4] hypothyroidism continue levothyroxine.  5] anemia secondary to #2 with drop in hemoglobin to 6.5 blood transfusion ordered follow up with levels tomorrow.  On admission his hemoglobin was 7.9.  He has no active bleeding.  However he is pancytopenic.  Part of this is probably also related to IV hydration and dilutional.  DVT prophylaxis SCD Code Status: DO NOT RESUSCITATE Family Communication: No family available Disposition Plan: TBD Consultants: General surgery, interventional radiology  Procedures: None Antimicrobials: Zosyn  Subjective: Resting in bed texting on the phone complaining of severe abdominal pain however cheerful complaining of being hungry complains of being having nausea and had a bowel movement.  Objective: Vitals:   06/26/18 0336 06/26/18 0507 06/26/18 0822 06/26/18 0852  BP: 98/64 99/61 97/63  98/65  Pulse: 65 67 68 63  Resp:  15 18 18   Temp:  97.8 F (36.6 C) 97.9 F (36.6 C) 98 F (36.7 C)  TempSrc:  Oral Oral Oral  SpO2:  96% 94% 94%    Intake/Output Summary (Last 24 hours) at 06/26/2018 1044 Last data  filed at 06/26/2018 1020 Gross per 24 hour  Intake 2584.86 ml  Output 800 ml  Net 1784.86 ml   There were no vitals filed for this visit.  Examination:  General exam: Appears calm and comfortable  Respiratory system: Clear to auscultation. Respiratory effort normal. Cardiovascular system: S1 & S2 heard, RRR. No JVD, murmurs, rubs, gallops or clicks. No pedal edema. Gastrointestinal system: Abdomen is distended, soft and tender. No organomegaly or masses felt. Normal bowel sounds heard. Central nervous system: Alert and oriented. No focal neurological deficits. Extremities: Symmetric 5 x 5 power. Skin: No rashes, lesions or ulcers Psychiatry: Judgement and insight appear normal. Mood & affect appropriate.     Data Reviewed: I have personally reviewed following labs and imaging studies  CBC: Recent Labs  Lab 06/21/18 0909 06/25/18 1058 06/26/18 0351  WBC 2.4* 2.3* 1.9*  NEUTROABS 0.8* 1.2*  --   HGB 5.6* 7.9* 6.5*  HCT 17.2* 24.2* 19.8*  MCV 91.0 89.0 90.0  PLT 64* 79* 63*   Basic Metabolic Panel: Recent Labs  Lab 06/25/18 1058 06/26/18 0351  NA 138 135  K 3.7 3.5  CL 109 108  CO2 23 23  GLUCOSE 121* 91  BUN 14 12  CREATININE 0.70 0.72  CALCIUM 8.1* 7.4*   GFR: CrCl cannot be calculated (Unknown ideal weight.). Liver Function Tests: Recent Labs  Lab 06/25/18 1058 06/26/18 0351  AST 18 14*  ALT 15 14  ALKPHOS 58 47  BILITOT 1.2 1.0  PROT 7.4 6.1*  ALBUMIN 3.3* 2.6*   Recent Labs  Lab 06/25/18 1058  LIPASE 39   No results for input(s): AMMONIA in the last 168 hours. Coagulation Profile: Recent Labs  Lab 06/26/18 0351  INR 1.49   Cardiac Enzymes: No results for input(s): CKTOTAL, CKMB, CKMBINDEX, TROPONINI in the last 168 hours. BNP (last 3 results) No results for input(s): PROBNP in the last 8760 hours. HbA1C: No results for input(s): HGBA1C in the last 72 hours. CBG: No results for input(s): GLUCAP in the last 168 hours. Lipid  Profile: No results for input(s): CHOL, HDL, LDLCALC, TRIG, CHOLHDL, LDLDIRECT in the last 72 hours. Thyroid Function Tests: No results for input(s): TSH, T4TOTAL, FREET4, T3FREE, THYROIDAB in the last 72 hours. Anemia Panel: No results for input(s): VITAMINB12, FOLATE, FERRITIN, TIBC, IRON, RETICCTPCT in the last 72 hours. Sepsis Labs: Recent Labs  Lab 06/25/18 1104 06/25/18 1520  LATICACIDVEN 1.44 0.46*    No results found for this or any previous visit (from the past 240 hour(s)).       Radiology Studies: Ct Abdomen Pelvis W Contrast  Result Date: 06/25/2018 CLINICAL DATA:  Abdominal pain. Diarrhea. Constipation. RIGHT lower quadrant pain. Dark stool. Neutropenia EXAM: CT ABDOMEN AND PELVIS WITH CONTRAST TECHNIQUE: Multidetector CT imaging of the abdomen and pelvis was performed using the standard protocol following bolus administration of intravenous contrast. CONTRAST:  165mL ISOVUE-300 IOPAMIDOL (ISOVUE-300) INJECTION 61% COMPARISON:  CT 03/11/2017 FINDINGS: Lower chest: Focus of scattered small peripheral focus of consolidation air bronchograms in the peripheral RIGHT middle lobe (image 31/6). Diffuse increase in interlobular septal thickening. Hepatobiliary: Several large hepatic cysts noted. There is new distention of the gallbladder to 5.2 cm. There  is pericholecystic fluid along the distended gallbladder. No radiodense gallstones present. There is mild enhancement of the cystic duct (image 40/2). No dilatation of the common bile duct. Pancreas: Normal pancreatic duct. No pancreatic inflammation. Moderate size periampullary duodenum diverticulum noted measuring 3 cm (image 38/2). Spleen: Normal spleen Adrenals/urinary tract: Adrenal glands normal. Several nonobstructing calculi in the RIGHT kidney and LEFT kidney. Ureters bladder normal. No obstructive uropathy. Small diverticulum extending from the LEFT anterior bladder. Stomach/Bowel: Stomach, small-bowel, cecum normal. Appendix is  not clearly identified; however there are no secondary signs of acute appendicitis. On comparison exam the appendix was just beneath the gallbladder. Ascending, transverse, descending colon appear normal. There is some haziness to the mesentery. There is little intra-abdominal fat. These findings make evaluation of bowel difficult. Vascular/Lymphatic: Abdominal aorta is normal caliber with atherosclerotic calcification. There is no retroperitoneal or periportal lymphadenopathy. No pelvic lymphadenopathy. Reproductive: Prostate normal Other: No free fluid or free air Musculoskeletal: No aggressive osseous lesion. IMPRESSION: 1. Distended gallbladder with pericholecystic fluid is most consistent with acute cholecystitis (calculus or a calculus). No biliary duct dilatation. 2. Appendix is not identified. On the comparison exam, the appendix was just beneath the gallbladder therefore cannot exclude secondary gallbladder inflammation from appendicitis, although less favored. 3. Pancreas appears normal. Small periampullary duodenum diverticulum. 4. Bilateral nonobstructing renal calculi. Electronically Signed   By: Suzy Bouchard M.D.   On: 06/25/2018 13:54   US Abdomen Limited Ruq  Result Date: 06/25/2018 CLINICAL DATA:  Right upper quadrant pain EXAM: ULTRASOUND ABDOMEN LIMITED RIGHT UPPER QUADRANT COMPARISON:  CT 06/25/2018 FINDINGS: Gallbladder: Small to moderate sludge within the gallbladder, punctate echodense foci within the sludge, questionable for punctate stones. Positive sonographic Murphy. Increased wall thickness at 7.2 mm with vascularity. Common bile duct: Diameter: Slightly enlarged at 7 mm Liver: Septated cyst in the liver measuring 4.8 x 4.9 x 4.4 cm. Additional smaller cyst in the posterior right hepatic lobe measuring 3 x 3.2 x 1.8 cm. Trace perihepatic fluid. Portal vein is patent on color Doppler imaging with normal direction of blood flow towards the liver. IMPRESSION: 1. Dilated gallbladder  with moderate sludge, vascular wall thickening, and positive sonographic Murphy sign, findings are consistent with an acute cholecystitis. 2. Slightly enlarged common bile duct at 7 mm, correlate with LFT with follow-up MRCP as indicated 3. Small amount of perihepatic free fluid.  Cysts within the liver Electronically Signed   By: Donavan Foil M.D.   On: 06/25/2018 15:11        Scheduled Meds: . sodium chloride   Intravenous Once  . calcipotriene   Topical BID  . desmopressin  0.2 mg Oral QHS  . finasteride  1 mg Oral Q breakfast  . levothyroxine  175 mcg Oral QHS  . multivitamin  1 tablet Oral Q breakfast  . multivitamin with minerals  1 tablet Oral Daily  . ondansetron (ZOFRAN) IV  4 mg Intravenous Once  . pantoprazole  40 mg Oral Daily  . sodium chloride flush  3 mL Intravenous Q12H   Continuous Infusions: . sodium chloride 75 mL/hr at 06/26/18 0200  . piperacillin-tazobactam (ZOSYN)  IV 3.375 g (06/26/18 0734)     LOS: 1 day     Georgette Shell, MD [facility]  If 7PM-7AM, please contact night-coverage [email or web address] Password [facility] 06/26/2018, 10:44 AM

## 2018-06-26 NOTE — Progress Notes (Signed)
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   HgB 6.5 this AM.  Afebrile.  CT Abdomen/Pelvis showed: 1. Distended gallbladder with pericholecystic fluid is most consistent with acute cholecystitis (calculus or a calculus). No biliary duct dilatation. 2. Appendix is not identified. On the comparison exam, the appendix was just beneath the gallbladder therefore cannot exclude secondary gallbladder inflammation from appendicitis, although less favored. 3. Pancreas appears normal. Small periampullary duodenum diverticulum. 4. Bilateral nonobstructing renal calculi.  Gall bladder is distended, however clinical symptoms not consistent with acute cholecystitis. Dr. Barbie Banner recommends a NM study for further work-up.  This has been communicated to medical team.  IR to follow for results.   Brynda Greathouse, MS RD PA-C 8:49 AM

## 2018-06-26 NOTE — Progress Notes (Signed)
CRITICAL VALUE STICKER  CRITICAL VALUE:  hgb 6.5  RECEIVER (on-site recipient of call): P. Minh Jasper RN  DATE & TIME NOTIFIED: [phone]    06/26/18  MESSENGER (representative from lab):  Reina Fuse  MD NOTIFIED: [place]  TIME OF NOTIFICATION: 0511  RESPONSE: orders placed

## 2018-06-27 ENCOUNTER — Inpatient Hospital Stay (HOSPITAL_COMMUNITY): Payer: Medicare Other

## 2018-06-27 ENCOUNTER — Telehealth: Payer: Self-pay | Admitting: Medical Oncology

## 2018-06-27 ENCOUNTER — Encounter (HOSPITAL_COMMUNITY): Payer: Self-pay | Admitting: Interventional Radiology

## 2018-06-27 DIAGNOSIS — K819 Cholecystitis, unspecified: Secondary | ICD-10-CM

## 2018-06-27 HISTORY — PX: IR PERC CHOLECYSTOSTOMY: IMG2326

## 2018-06-27 LAB — COMPREHENSIVE METABOLIC PANEL
ALBUMIN: 2.6 g/dL — AB (ref 3.5–5.0)
ALK PHOS: 47 U/L (ref 38–126)
ALT: 15 U/L (ref 0–44)
ANION GAP: 3 — AB (ref 5–15)
AST: 17 U/L (ref 15–41)
BUN: 9 mg/dL (ref 8–23)
CALCIUM: 7.4 mg/dL — AB (ref 8.9–10.3)
CHLORIDE: 108 mmol/L (ref 98–111)
CO2: 23 mmol/L (ref 22–32)
CREATININE: 0.76 mg/dL (ref 0.61–1.24)
GFR calc non Af Amer: >60 mL/min (ref >60)
GLUCOSE: 91 mg/dL (ref 70–99)
Potassium: 3.5 mmol/L (ref 3.5–5.1)
Sodium: 134 mmol/L — ABNORMAL LOW (ref 135–145)
Total Bilirubin: 1.2 mg/dL (ref 0.3–1.2)
Total Protein: 6.2 g/dL — ABNORMAL LOW (ref 6.5–8.1)

## 2018-06-27 LAB — TYPE AND SCREEN
ABO/RH(D): A POS
Antibody Screen: NEGATIVE
UNIT DIVISION: 0

## 2018-06-27 LAB — CBC
HEMATOCRIT: 24.4 % — AB (ref 39.0–52.0)
HEMOGLOBIN: 8.3 g/dL — AB (ref 13.0–17.0)
MCH: 30.4 pg (ref 26.0–34.0)
MCHC: 34 g/dL (ref 30.0–36.0)
MCV: 89.4 fL (ref 78.0–100.0)
Platelets: 68 10*3/uL — ABNORMAL LOW (ref 150–400)
RBC: 2.73 MIL/uL — AB (ref 4.22–5.81)
RDW: 16.5 % — ABNORMAL HIGH (ref 11.5–15.5)
WBC: 1.9 10*3/uL — AB (ref 4.0–10.5)

## 2018-06-27 LAB — BPAM RBC
BLOOD PRODUCT EXPIRATION DATE: 201910202359
ISSUE DATE / TIME: 201909290827
Unit Type and Rh: 6200

## 2018-06-27 LAB — PROTIME-INR
INR: 1.52
Prothrombin Time: 18.2 seconds — ABNORMAL HIGH (ref 11.4–15.2)

## 2018-06-27 MED ORDER — FENTANYL CITRATE (PF) 100 MCG/2ML IJ SOLN
INTRAMUSCULAR | Status: AC | PRN
  Administered 2018-06-27 (×2): 25 ug via INTRAVENOUS
  Administered 2018-06-27: 50 ug via INTRAVENOUS

## 2018-06-27 MED ORDER — TBO-FILGRASTIM 300 MCG/0.5ML ~~LOC~~ SOSY
300.0000 ug | PREFILLED_SYRINGE | Freq: Every day | SUBCUTANEOUS | Status: AC
  Administered 2018-06-27 – 2018-06-29 (×3): 300 ug via SUBCUTANEOUS
  Filled 2018-06-27 (×3): qty 0.5

## 2018-06-27 MED ORDER — SODIUM CHLORIDE 0.9% FLUSH
5.0000 mL | Freq: Three times a day (TID) | INTRAVENOUS | Status: DC
  Administered 2018-06-27 – 2018-06-30 (×10): 5 mL

## 2018-06-27 MED ORDER — LIDOCAINE HCL (PF) 1 % IJ SOLN
INTRAMUSCULAR | Status: AC | PRN
  Administered 2018-06-27: 10 mL

## 2018-06-27 MED ORDER — FENTANYL CITRATE (PF) 100 MCG/2ML IJ SOLN
INTRAMUSCULAR | Status: AC
  Filled 2018-06-27: qty 2

## 2018-06-27 MED ORDER — IOPAMIDOL (ISOVUE-300) INJECTION 61%
INTRAVENOUS | Status: AC
  Administered 2018-06-27: 5 mL
  Filled 2018-06-27: qty 50

## 2018-06-27 MED ORDER — MIDAZOLAM HCL 2 MG/2ML IJ SOLN
INTRAMUSCULAR | Status: AC | PRN
  Administered 2018-06-27 (×2): 1 mg via INTRAVENOUS

## 2018-06-27 MED ORDER — PIPERACILLIN-TAZOBACTAM 3.375 G IVPB
INTRAVENOUS | Status: AC
  Filled 2018-06-27: qty 50

## 2018-06-27 MED ORDER — MIDAZOLAM HCL 2 MG/2ML IJ SOLN
INTRAMUSCULAR | Status: AC
  Filled 2018-06-27: qty 2

## 2018-06-27 MED ORDER — LIDOCAINE HCL (PF) 1 % IJ SOLN
INTRAMUSCULAR | Status: AC
  Filled 2018-06-27: qty 30

## 2018-06-27 MED ORDER — IOPAMIDOL (ISOVUE-300) INJECTION 61%
50.0000 mL | Freq: Once | INTRAVENOUS | Status: AC | PRN
  Administered 2018-06-27: 5 mL

## 2018-06-27 NOTE — Procedures (Signed)
  Procedure: Perc cholecystostomy placement 13f EBL:   minimal Complications:  none immediate  See full dictation in [facility].  Dillard Cannon MD Main # [phone] Pager  [phone]

## 2018-06-27 NOTE — Progress Notes (Signed)
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 Pain was sharp and did not radiate. Patient reports that the pain would wax and wane. Patient was unable to tolerate p.o. due to decreased appetite and significant amounts of nausea. Patient did have some diarrhea. He did not have any emesis. He denies any cough, congestion, rhinorrhea, fevers, dysuria, constipation. 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 He is followed at [facility] for this primarily.  06/27/2018: Patient seen and examined with his wife at bedside.  Reports pain at his right upper quadrant worse with movement.  General surgery consulted and following.  High risk for surgery.  Possible percutaneous drainage by interventional radiology.  Assessment/Plan: Active Problems:   Meniere's syndrome   MDS (myelodysplastic syndrome) (HCC)   Hypothyroidism   Acute cholecystitis   RUQ pain  Acute cholecystitis versus others Acute cholecystitis on CT scan HIDA scan unrevealing High risk for surgery Plan for possible percutaneous drainage by interventional radiology General surgery following Pain management and bowel regimen in place IV Dilaudid for severe pain Tramadol for moderate pain Senokot 2 tablets twice daily and MiraLAX daily bowel regimen  Bilateral nonobstructing renal calculi.  MDS transformed into acute leukemia followed at [facility].  Discussed with Dr. Earlie Server.  Poor prognosis.  He recommended hospice however patient's  wife declines.   Leukopenia WBC 1.9 Per Dr. Earlie Server can start Granix 300 mcg subcu daily Repeat CBC in the morning  Nocturia-continue DDAVP.  Hypothyroidism continue levothyroxine.  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  Per Dr. Earlie Server he has required multiple blood transfusions in the past Hemoglobin stable at 8.3 Repeat CBC in the morning  Pancytopenia/thrombocytopenia Platelets 68k No sign of overt bleeding  Cachexia/physical debility/severe protein calorie malnutrition Oral supplement Encourage oral intake as tolerated Fall precautions       Code Status: DNR  Family Communication: Wife at bedside.  Disposition Plan: Home when clinically stable   Consultants:  General surgery  Procedures:  None  Antimicrobials: IV Zosyn  DVT prophylaxis: SCDs   Objective: Vitals:   06/26/18 1246 06/26/18 2215 06/27/18 0638 06/27/18 1321  BP: (!) 92/58 (!) 108/57 104/89 (!) 108/58  Pulse: 72 80 69 72  Resp: 16 18 18    Temp: 98 F (36.7 C) 98.2 F (36.8 C) 97.9 F (36.6 C) 98.7 F (37.1 C)  TempSrc: Oral Oral Oral Oral  SpO2: 95% 97% 93% 96%    Intake/Output Summary (Last 24 hours) at 06/27/2018 1341 Last data filed at 06/27/2018 1057 Gross per 24 hour  Intake 2518.95 ml  Output 1000 ml  Net 1518.95 ml   There were no vitals filed for this visit.  Exam:  . General: 84 y.o. year-old male frail in no acute distress.  Alert and minimally interactive. . Cardiovascular: Regular rate and rhythm with no rubs or gallops.  No thyromegaly or JVD noted.   Marland Kitchen Respiratory: Clear to auscultation with no wheezes or rales. Good inspiratory effort. . Abdomen: Soft tenderness on palpation of right upper quadrant.  Nondistended with normal bowel sounds  x4 quadrants. . Musculoskeletal: No lower extremity edema. 2/4 pulses in all 4 extremities. Marland Kitchen Psychiatry: Mood is appropriate for condition and setting   Data  Reviewed: CBC: Recent Labs  Lab 06/21/18 0909 06/25/18 1058 06/26/18 0351 06/26/18 1529 06/27/18 0356  WBC 2.4* 2.3* 1.9*  --  1.9*  NEUTROABS 0.8* 1.2*  --   --   --   HGB 5.6* 7.9* 6.5* 8.1* 8.3*  HCT 17.2* 24.2* 19.8* 24.4* 24.4*  MCV 91.0 89.0 90.0  --  89.4  PLT 64* 79* 63*  --  68*   Basic Metabolic Panel: Recent Labs  Lab 06/25/18 1058 06/26/18 0351 06/27/18 0356  NA 138 135 134*  K 3.7 3.5 3.5  CL 109 108 108  CO2 23 23 23   GLUCOSE 121* 91 91  BUN 14 12 9   CREATININE 0.70 0.72 0.76  CALCIUM 8.1* 7.4* 7.4*   GFR: CrCl cannot be calculated (Unknown ideal weight.). Liver Function Tests: Recent Labs  Lab 06/25/18 1058 06/26/18 0351 06/27/18 0356  AST 18 14* 17  ALT 15 14 15   ALKPHOS 58 47 47  BILITOT 1.2 1.0 1.2  PROT 7.4 6.1* 6.2*  ALBUMIN 3.3* 2.6* 2.6*   Recent Labs  Lab 06/25/18 1058  LIPASE 39   No results for input(s): AMMONIA in the last 168 hours. Coagulation Profile: Recent Labs  Lab 06/26/18 0351 06/27/18 0356  INR 1.49 1.52   Cardiac Enzymes: No results for input(s): CKTOTAL, CKMB, CKMBINDEX, TROPONINI in the last 168 hours. BNP (last 3 results) No results for input(s): PROBNP in the last 8760 hours. HbA1C: No results for input(s): HGBA1C in the last 72 hours. CBG: No results for input(s): GLUCAP in the last 168 hours. Lipid Profile: No results for input(s): CHOL, HDL, LDLCALC, TRIG, CHOLHDL, LDLDIRECT in the last 72 hours. Thyroid Function Tests: No results for input(s): TSH, T4TOTAL, FREET4, T3FREE, THYROIDAB in the last 72 hours. Anemia Panel: No results for input(s): VITAMINB12, FOLATE, FERRITIN, TIBC, IRON, RETICCTPCT in the last 72 hours. Urine analysis:    Component Value Date/Time   COLORURINE AMBER (A) 06/25/2018 1023   APPEARANCEUR CLEAR 06/25/2018 1023   LABSPEC 1.021 06/25/2018 1023   PHURINE 6.0 06/25/2018 1023   GLUCOSEU NEGATIVE 06/25/2018 1023   HGBUR NEGATIVE 06/25/2018 1023   BILIRUBINUR NEGATIVE  06/25/2018 1023   KETONESUR NEGATIVE 06/25/2018 1023   PROTEINUR NEGATIVE 06/25/2018 1023   NITRITE NEGATIVE 06/25/2018 1023   LEUKOCYTESUR NEGATIVE 06/25/2018 1023   Sepsis Labs: @LABRCNTIP (procalcitonin:4,lacticidven:4)  ) Recent Results (from the past 240 hour(s))  Blood culture (routine x 2)     Status: None (Preliminary result)   Collection Time: 06/25/18  4:10 PM  Result Value Ref Range Status   Specimen Description   Final    BLOOD BLOOD LEFT FOREARM Performed at [facility], [place] [address], [place], [place] [address]    Special Requests   Final    BOTTLES DRAWN AEROBIC AND ANAEROBIC Blood Culture results may not be optimal due to an excessive volume of blood received in culture bottles Performed at [facility] [address] [place], [place] [address]    Culture   Final    NO GROWTH < 24 HOURS Performed at [facility] [address] [place],  [address]    Report Status PENDING  Incomplete  Blood culture (routine x 2)     Status: None (Preliminary result)   Collection Time: 06/25/18  4:12 PM  Result Value Ref  Range Status   Specimen Description   Final    BLOOD RIGHT ANTECUBITAL Performed at [facility] [address] [place], [place] [address]    Special Requests   Final    BOTTLES DRAWN AEROBIC AND ANAEROBIC Blood Culture adequate volume Performed at [facility] [address], [place], [place] [address]    Culture   Final    NO GROWTH < 24 HOURS Performed at [facility] [address] [place], [place] [address]    Report Status PENDING  Incomplete      Studies: Nm Hepatobiliary Liver Func  Result Date: 06/26/2018 CLINICAL DATA:  Abdominal pain with abnormal CT and ultrasound EXAM: NUCLEAR MEDICINE HEPATOBILIARY IMAGING TECHNIQUE: Sequential images of the abdomen were obtained out to 60 minutes following intravenous administration of  radiopharmaceutical. 2.7 mg of intravenous morphine administered after 1 hour and nonvisualization of the gallbladder. Additional images were acquired for 30 minutes. RADIOPHARMACEUTICALS:  5.27 mCi Tc-14m  Choletec IV COMPARISON:  CT and ultrasound 06/25/2018 FINDINGS: Due to equipment malfunction, static frames from initial phase of the HIDA scan were not able to be transferred to PACs. A single static image is available which demonstrates bowel activity but no definite gallbladder activity. Following morphine augmentation, there is filling of the gallbladder. Homogeneous radiotracer activity in the liver. IMPRESSION: 1. Gallbladder visualization following morphine augmentation suggests patency of the cystic duct. Findings on recent imaging studies could be secondary to chronic cholecystitis. 2. Normal biliary to bowel transit. Electronically Signed   By: Donavan Foil M.D.   On: 06/26/2018 22:15    Scheduled Meds: . sodium chloride   Intravenous Once  . calcipotriene   Topical BID  . desmopressin  0.2 mg Oral QHS  . finasteride  1 mg Oral Q breakfast  . levothyroxine  175 mcg Oral QHS  . multivitamin  1 tablet Oral Q breakfast  . multivitamin with minerals  1 tablet Oral Daily  . ondansetron (ZOFRAN) IV  4 mg Intravenous Once  . pantoprazole  40 mg Oral Daily  . sodium chloride flush  3 mL Intravenous Q12H  . Tbo-filgastrim (GRANIX) SQ  300 mcg Subcutaneous q1800    Continuous Infusions: . sodium chloride 75 mL/hr at 06/26/18 0200  . piperacillin-tazobactam (ZOSYN)  IV 3.375 g (06/27/18 0736)     LOS: 2 days     Kayleen Memos, MD [facility] Pager [phone]  If 7PM-7AM, please contact night-coverage [email or web address] Password [email or web address] 06/27/2018, 1:41 PM

## 2018-06-27 NOTE — Progress Notes (Signed)
Referring Physician(s): Hoxworth,B  Supervising Physician: Arne Cleveland  Patient Status:  [facility] - In-pt  Chief Complaint: Abdominal pain, cholecystitis   Subjective: Patient familiar to IR service from prior bone marrow biopsy in 2016.  He has a history of myelodysplastic syndrome with refractory anemia and excess blasts which has transformed to acute leukemia.  He was recently admitted to [facility] with persistent abdominal pain and nausea.  CT scan of abdomen pelvis on 9/28 revealed distended gallbladder with pericholecystic fluid concerning for acute cholecystitis.  Ultrasound abdomen revealed dilated gallbladder with moderate sludge,  wall thickening; nuclear medicine hepatobiliary scan yesterday revealed gallbladder visualization suggesting patency of the cystic duct.  Patient was evaluated by surgery and deemed high risk for cholecystectomy secondary to leukemia.  Request now received for percutaneous cholecystostomy.  Additional medical history as below.  He currently denies fever, headache, chest pain, dyspnea, cough, back pain, nausea, vomiting or bleeding.  Past Medical History:  Diagnosis Date  . Anemia   . Cancer (HCC)    myelodysplastic syndrome  . Complication of anesthesia    Reaction to anesthesia " things went haywire around me"  . Do not resuscitate 11/04/2015  . Hearing impairment    wears hearing left ear  . History of ITP 1960  . Hypothyroidism   . Meniere's syndrome 1980s  . Port catheter in place 12/23/2015   Past Surgical History:  Procedure Laterality Date  . cataract Right 2011  . CIRCUMCISION    . COLONOSCOPY    . cortisone injections to back x 6    . ORIF ORBITAL FRACTURE Left 2000   2 fractures orbital platform       Allergies: Percocet [oxycodone-acetaminophen]; Hydrocodone; and Oxycodone  Medications: Prior to Admission medications   Medication Sig Start Date End Date Taking? Authorizing Provider  calcipotriene (DOVONOX)  0.005 % ointment Apply topically 2 (two) times daily.   Yes [provider]  Darbepoetin Alfa (ARANESP) 300 MCG/0.6ML SOSY injection Inject 300 mcg into the skin every 30 (thirty) days. As needed based on blood counts   Yes [provider]  desmopressin (DDAVP) 0.2 MG tablet Take 0.2 mg by mouth daily.   Yes [provider]  finasteride (PROPECIA) 1 MG tablet Take 1 mg by mouth daily with breakfast.    Yes [provider]  levothyroxine (SYNTHROID, LEVOTHROID) 175 MCG tablet Take 175 mcg by mouth at bedtime.    Yes [provider]  Lutein 40 MG CAPS Take 40 mg by mouth daily with breakfast.  10/09/15  Yes [provider]  Multiple Vitamins-Minerals (CENTRUM ADULTS PO) Take 1 tablet by mouth daily.    Yes [provider]  omeprazole (PRILOSEC) 20 MG capsule Take 20 mg by mouth daily.   Yes [provider]  traMADol (ULTRAM) 50 MG tablet Take 50 mg by mouth every 6 (six) hours as needed.   Yes [provider]  triamcinolone (KENALOG) 0.025 % cream Apply 1 application topically 2 (two) times daily as needed (for inflammation).    Yes [provider]     Vital Signs: BP (!) 108/58 (BP Location: Right Arm)   Pulse 72   Temp 98.7 F (37.1 C) (Oral)   Resp 18   SpO2 96%   Physical Exam awake, alert.  Chest with distant breath sounds bilaterally.  Heart with regular rate and rhythm, soft murmur.  Abdomen soft, diffusely tender right greater than left side, few bowel sounds.  No lower extremity edema  Imaging: Nm Hepatobiliary Liver Func  Result Date: 06/26/2018 CLINICAL DATA:  Abdominal pain with abnormal CT and ultrasound EXAM: NUCLEAR MEDICINE HEPATOBILIARY IMAGING TECHNIQUE: Sequential images of the abdomen were obtained out to 60 minutes following intravenous administration of radiopharmaceutical. 2.7 mg of intravenous morphine administered after 1 hour and nonvisualization of the gallbladder. Additional  images were acquired for 30 minutes. RADIOPHARMACEUTICALS:  5.27 mCi Tc-39m Choletec IV COMPARISON:  CT and ultrasound 06/25/2018 FINDINGS: Due to equipment malfunction, static frames from initial phase of the HIDA scan were not able to be transferred to PACs. A single static image is available which demonstrates bowel activity but no definite gallbladder activity. Following morphine augmentation, there is filling of the gallbladder. Homogeneous radiotracer activity in the liver. IMPRESSION: 1. Gallbladder visualization following morphine augmentation suggests patency of the cystic duct. Findings on recent imaging studies could be secondary to chronic cholecystitis. 2. Normal biliary to bowel transit. Electronically Signed   By: KDonavan FoilM.D.   On: 06/26/2018 22:15   Ct Abdomen Pelvis W Contrast  Result Date: 06/25/2018 CLINICAL DATA:  Abdominal pain. Diarrhea. Constipation. RIGHT lower quadrant pain. Dark stool. Neutropenia EXAM: CT ABDOMEN AND PELVIS WITH CONTRAST TECHNIQUE: Multidetector CT imaging of the abdomen and pelvis was performed using the standard protocol following bolus administration of intravenous contrast. CONTRAST:  1026mISOVUE-300 IOPAMIDOL (ISOVUE-300) INJECTION 61% COMPARISON:  CT 03/11/2017 FINDINGS: Lower chest: Focus of scattered small peripheral focus of consolidation air bronchograms in the peripheral RIGHT middle lobe (image 31/6). Diffuse increase in interlobular septal thickening. Hepatobiliary: Several large hepatic cysts noted. There is new distention of the gallbladder to 5.2 cm. There is pericholecystic fluid along the distended gallbladder. No radiodense gallstones present. There is mild enhancement of the cystic duct (image 40/2). No dilatation of the common bile duct. Pancreas: Normal pancreatic duct. No pancreatic inflammation. Moderate size periampullary duodenum diverticulum noted measuring 3 cm (image 38/2). Spleen: Normal spleen Adrenals/urinary tract: Adrenal  glands normal. Several nonobstructing calculi in the RIGHT kidney and LEFT kidney. Ureters bladder normal. No obstructive uropathy. Small diverticulum extending from the LEFT anterior bladder. Stomach/Bowel: Stomach, small-bowel, cecum normal. Appendix is not clearly identified; however there are no secondary signs of acute appendicitis. On comparison exam the appendix was just beneath the gallbladder. Ascending, transverse, descending colon appear normal. There is some haziness to the mesentery. There is little intra-abdominal fat. These findings make evaluation of bowel difficult. Vascular/Lymphatic: Abdominal aorta is normal caliber with atherosclerotic calcification. There is no retroperitoneal or periportal lymphadenopathy. No pelvic lymphadenopathy. Reproductive: Prostate normal Other: No free fluid or free air Musculoskeletal: No aggressive osseous lesion. IMPRESSION: 1. Distended gallbladder with pericholecystic fluid is most consistent with acute cholecystitis (calculus or a calculus). No biliary duct dilatation. 2. Appendix is not identified. On the comparison exam, the appendix was just beneath the gallbladder therefore cannot exclude secondary gallbladder inflammation from appendicitis, although less favored. 3. Pancreas appears normal. Small periampullary duodenum diverticulum. 4. Bilateral nonobstructing renal calculi. Electronically Signed   By: StSuzy Bouchard.D.   On: 06/25/2018 13:54   UsKoreabdomen Limited Ruq  Result Date: 06/25/2018 CLINICAL DATA:  Right upper quadrant pain EXAM: ULTRASOUND ABDOMEN LIMITED RIGHT UPPER QUADRANT COMPARISON:  CT 06/25/2018 FINDINGS: Gallbladder: Small to moderate sludge within the gallbladder, punctate echodense foci within the sludge, questionable for punctate stones. Positive sonographic Murphy. Increased wall thickness at 7.2 mm with vascularity. Common bile duct: Diameter: Slightly enlarged at 7 mm Liver: Septated cyst in the liver measuring 4.8 x  4.9 x 4.4  cm. Additional smaller cyst in the posterior right hepatic lobe measuring 3 x 3.2 x 1.8 cm. Trace perihepatic fluid. Portal vein is patent on color Doppler imaging with normal direction of blood flow towards the liver. IMPRESSION: 1. Dilated gallbladder with moderate sludge, vascular wall thickening, and positive sonographic Murphy sign, findings are consistent with an acute cholecystitis. 2. Slightly enlarged common bile duct at 7 mm, correlate with LFT with follow-up MRCP as indicated 3. Small amount of perihepatic free fluid.  Cysts within the liver Electronically Signed   By: Donavan Foil M.D.   On: 06/25/2018 15:11    Labs:  CBC: Recent Labs    06/21/18 0909 06/25/18 1058 06/26/18 0351 06/26/18 1529 06/27/18 0356  WBC 2.4* 2.3* 1.9*  --  1.9*  HGB 5.6* 7.9* 6.5* 8.1* 8.3*  HCT 17.2* 24.2* 19.8* 24.4* 24.4*  PLT 64* 79* 63*  --  68*    COAGS: Recent Labs    06/26/18 0351 06/27/18 0356  INR 1.49 1.52  APTT 44*  --     BMP: Recent Labs    01/24/18 0953 06/25/18 1058 06/26/18 0351 06/27/18 0356  NA 134* 138 135 134*  K 4.1 3.7 3.5 3.5  CL 102 109 108 108  CO2 _0 GLUCOSE 80 121* 91 91  BUN _1 CALCIUM 9.0 8.1* 7.4* 7.4*  CREATININE 0.85 0.70 0.72 0.76  GFRNONAA >60 >60 >60 >60  GFRAA >60 >60 >60 >60    LIVER FUNCTION TESTS: Recent Labs    01/24/18 0953 06/25/18 1058 06/26/18 0351 06/27/18 0356  BILITOT 0.8 1.2 1.0 1.2  AST 18 18 14* 17  ALT _2 ALKPHOS 60 58 47 47  PROT 7.6 7.4 6.1* 6.2*  ALBUMIN 3.9 3.3* 2.6* 2.6*    Assessment and Plan:  Pt with hx MDS with transformation to acute leukemia.  He was recently admitted to [facility] with persistent abdominal pain and nausea.  CT scan of abdomen pelvis on 9/28 revealed distended gallbladder with pericholecystic fluid concerning for acute cholecystitis.  Ultrasound abdomen revealed dilated gallbladder with moderate sludge,  wall thickening; nuclear medicine  hepatobiliary scan yesterday revealed gallbladder visualization suggesting patency of the cystic duct.  Patient was evaluated by surgery and deemed high risk for cholecystectomy secondary to leukemia.  Request now received for percutaneous cholecystostomy.  Imaging studies have been reviewed by Dr. Vernard Gambles and case discussed with Dr. Excell Seltzer.Risks and benefits discussed with the patient/spouse including bleeding, infection, damage to adjacent structures, need for prolonged drainage and sepsis.  All of the patient's questions were answered, patient is agreeable to proceed. Consent signed and in chart.  Procedure scheduled for today   Electronically Signed: D. Rowe Robert, PA-C 06/27/2018, 2:05 PM   I spent a total of 25 minutes at the the patient's bedside AND on the patient's hospital floor or unit, greater than 50% of which was counseling/coordinating care for percutaneous cholecystostomy    Patient ID: Dean Stanley, male   DOB: November 27, 1930, 84 y.o.   MRN: [id number]

## 2018-06-27 NOTE — Progress Notes (Signed)
PT Cancellation Note  Patient Details Name: Dean Stanley MRN: [id number] DOB: 1931/02/01   Cancelled Treatment:    Reason Eval/Treat Not Completed: Patient at procedure or test/unavailable--pt had drain placed today. Will check back another day for PT eval.    Weston Anna, PT Acute Rehabilitation Services Pager: [phone] Office: [phone]

## 2018-06-27 NOTE — Telephone Encounter (Signed)
Spoke to wife . Pt had a Percutaneous cholecystostomy tube placed today .

## 2018-06-27 NOTE — Progress Notes (Signed)
Patient ID: Dean Stanley, male   DOB: Feb 12, 1931, 84 y.o.   MRN: [id number]     Subjective: Still with significant somewhat intermittent abdominal pain, worse with motion.  He indicates center of the pain in his right mid abdomen to right upper quadrant radiating across his abdomen.  Becomes 10/10 at times.  Objective: Vital signs in last 24 hours: Temp:  [97.9 F (36.6 C)-98.2 F (36.8 C)] 97.9 F (36.6 C) (09/30 1572) Pulse Rate:  [69-80] 69 (09/30 0638) Resp:  [18] 18 (09/30 6203) BP: (104-108)/(57-89) 104/89 (09/30 5597) SpO2:  [93 %-97 %] 93 % (09/30 4163) Last BM Date: 06/25/18  Intake/Output from previous day: 09/29 0701 - 09/30 0700 In: 2346.3 [P.O.:600; I.V.:1205; Blood:376; IV Piggyback:165.3] Out: 1000 [Urine:1000] Intake/Output this shift: Total I/O In: 1227.7 [I.V.:1197.5; IV Piggyback:30.2] Out: 300 [Urine:300]  General appearance: alert, cooperative and no distress GI: Soft with minimal distention.  Mild diffuse tenderness but much more marked right upper quadrant tenderness with guarding.  Lab Results:  Recent Labs    06/26/18 0351 06/26/18 1529 06/27/18 0356  WBC 1.9*  --  1.9*  HGB 6.5* 8.1* 8.3*  HCT 19.8* 24.4* 24.4*  PLT 63*  --  68*   BMET Recent Labs    06/26/18 0351 06/27/18 0356  NA 135 134*  K 3.5 3.5  CL 108 108  CO2 23 23  GLUCOSE 91 91  BUN 12 9  CREATININE 0.72 0.76  CALCIUM 7.4* 7.4*     Studies/Results: Nm Hepatobiliary Liver Func  Result Date: 06/26/2018 CLINICAL DATA:  Abdominal pain with abnormal CT and ultrasound EXAM: NUCLEAR MEDICINE HEPATOBILIARY IMAGING TECHNIQUE: Sequential images of the abdomen were obtained out to 60 minutes following intravenous administration of radiopharmaceutical. 2.7 mg of intravenous morphine administered after 1 hour and nonvisualization of the gallbladder. Additional images were acquired for 30 minutes. RADIOPHARMACEUTICALS:  5.27 mCi Tc-51m  Choletec IV COMPARISON:  CT and ultrasound  06/25/2018 FINDINGS: Due to equipment malfunction, static frames from initial phase of the HIDA scan were not able to be transferred to PACs. A single static image is available which demonstrates bowel activity but no definite gallbladder activity. Following morphine augmentation, there is filling of the gallbladder. Homogeneous radiotracer activity in the liver. IMPRESSION: 1. Gallbladder visualization following morphine augmentation suggests patency of the cystic duct. Findings on recent imaging studies could be secondary to chronic cholecystitis. 2. Normal biliary to bowel transit. Electronically Signed   By: Donavan Foil M.D.   On: 06/26/2018 22:15   US Abdomen Limited Ruq  Result Date: 06/25/2018 CLINICAL DATA:  Right upper quadrant pain EXAM: ULTRASOUND ABDOMEN LIMITED RIGHT UPPER QUADRANT COMPARISON:  CT 06/25/2018 FINDINGS: Gallbladder: Small to moderate sludge within the gallbladder, punctate echodense foci within the sludge, questionable for punctate stones. Positive sonographic Murphy. Increased wall thickness at 7.2 mm with vascularity. Common bile duct: Diameter: Slightly enlarged at 7 mm Liver: Septated cyst in the liver measuring 4.8 x 4.9 x 4.4 cm. Additional smaller cyst in the posterior right hepatic lobe measuring 3 x 3.2 x 1.8 cm. Trace perihepatic fluid. Portal vein is patent on color Doppler imaging with normal direction of blood flow towards the liver. IMPRESSION: 1. Dilated gallbladder with moderate sludge, vascular wall thickening, and positive sonographic Murphy sign, findings are consistent with an acute cholecystitis. 2. Slightly enlarged common bile duct at 7 mm, correlate with LFT with follow-up MRCP as indicated 3. Small amount of perihepatic free fluid.  Cysts within the liver Electronically Signed  By: Donavan Foil M.D.   On: 06/25/2018 15:11    Anti-infectives: Anti-infectives (From admission, onward)   Start     Dose/Rate Route Frequency Ordered Stop   06/26/18 0000   piperacillin-tazobactam (ZOSYN) IVPB 3.375 g     3.375 g 12.5 mL/hr over 240 Minutes Intravenous Every 8 hours 06/25/18 1748     06/25/18 1730  piperacillin-tazobactam (ZOSYN) IVPB 3.375 g  Status:  Discontinued     3.375 g 100 mL/hr over 30 Minutes Intravenous Every 8 hours 06/25/18 1726 06/25/18 1747   06/25/18 1530  piperacillin-tazobactam (ZOSYN) IVPB 3.375 g     3.375 g 100 mL/hr over 30 Minutes Intravenous  Once 06/25/18 1521 06/25/18 1657      Assessment/Plan: 84 year old male with leukemia and pancytopenia presented with acute initially poorly localized abdominal pain.  Currently he is able to localize his pain in the right upper quadrant which is the center of his tenderness. Previous imaging includes CT scan and ultrasound highly suggestive of acute cholecystitis with thickening and pericholecystic fluid.  HIDA scan however has shown some faint visualization of the gallbladder. He has ongoing pain that is not improving.  HIDA has some degree of false negative rate and I think his pain and tenderness and previous imaging is highly suggestive of cholecystitis.  No other source identified.  He is high risk from a surgical standpoint due to his leukemia and he does not want surgery if it can be avoided.  I think proceeding with percutaneous drainage is most feasible in the situation.  I discussed with Dr. Arne Cleveland who is in agreement and we will proceed with percutaneous drainage of his gallbladder.    LOS: 2 days    Dean Stanley 06/27/2018

## 2018-06-28 LAB — CBC
HEMATOCRIT: 26.5 % — AB (ref 39.0–52.0)
Hemoglobin: 8.8 g/dL — ABNORMAL LOW (ref 13.0–17.0)
MCH: 29.7 pg (ref 26.0–34.0)
MCHC: 33.2 g/dL (ref 30.0–36.0)
MCV: 89.5 fL (ref 78.0–100.0)
PLATELETS: 72 10*3/uL — AB (ref 150–400)
RBC: 2.96 MIL/uL — ABNORMAL LOW (ref 4.22–5.81)
RDW: 16.4 % — AB (ref 11.5–15.5)
WBC: 5.3 10*3/uL (ref 4.0–10.5)

## 2018-06-28 LAB — MAGNESIUM: Magnesium: 1.9 mg/dL (ref 1.7–2.4)

## 2018-06-28 LAB — BASIC METABOLIC PANEL
ANION GAP: 6 (ref 5–15)
BUN: 8 mg/dL (ref 8–23)
CALCIUM: 7.6 mg/dL — AB (ref 8.9–10.3)
CO2: 21 mmol/L — ABNORMAL LOW (ref 22–32)
CREATININE: 0.79 mg/dL (ref 0.61–1.24)
Chloride: 110 mmol/L (ref 98–111)
GFR calc Af Amer: >60 mL/min (ref >60)
GFR calc non Af Amer: >60 mL/min (ref >60)
GLUCOSE: 97 mg/dL (ref 70–99)
Potassium: 3.3 mmol/L — ABNORMAL LOW (ref 3.5–5.1)
Sodium: 137 mmol/L (ref 135–145)

## 2018-06-28 LAB — PHOSPHORUS: Phosphorus: 2 mg/dL — ABNORMAL LOW (ref 2.5–4.6)

## 2018-06-28 MED ORDER — SENNOSIDES-DOCUSATE SODIUM 8.6-50 MG PO TABS
2.0000 | ORAL_TABLET | Freq: Two times a day (BID) | ORAL | Status: DC
  Administered 2018-06-28 – 2018-06-30 (×5): 2 via ORAL
  Filled 2018-06-28 (×5): qty 2

## 2018-06-28 MED ORDER — POLYETHYLENE GLYCOL 3350 17 G PO PACK
17.0000 g | PACK | Freq: Every day | ORAL | Status: DC
  Administered 2018-06-29: 17 g via ORAL
  Filled 2018-06-28 (×2): qty 1

## 2018-06-28 MED ORDER — ACETAMINOPHEN 325 MG PO TABS
650.0000 mg | ORAL_TABLET | Freq: Four times a day (QID) | ORAL | Status: DC
  Administered 2018-06-28 – 2018-06-30 (×10): 650 mg via ORAL
  Filled 2018-06-28 (×11): qty 2

## 2018-06-28 MED ORDER — TRAMADOL HCL 50 MG PO TABS
100.0000 mg | ORAL_TABLET | Freq: Four times a day (QID) | ORAL | Status: DC
  Administered 2018-06-28 – 2018-06-30 (×3): 100 mg via ORAL
  Filled 2018-06-28 (×8): qty 2

## 2018-06-28 MED ORDER — POTASSIUM CHLORIDE CRYS ER 20 MEQ PO TBCR
40.0000 meq | EXTENDED_RELEASE_TABLET | Freq: Once | ORAL | Status: AC
  Administered 2018-06-28: 40 meq via ORAL
  Filled 2018-06-28: qty 2

## 2018-06-28 NOTE — Evaluation (Signed)
Physical Therapy Evaluation Patient Details Name: Dean Stanley MRN: [id number] DOB: 1930/12/03 Today's Date: 06/28/2018   History of Present Illness  84 yo male admitted with acute cholecystitis s/p drain placement 9/30. Hx of MDS, AML, anemia, hypothroidism  Clinical Impression  Min encouragement required for participation. On eval, pt required Min assist for mobility. He walked ~50 feet with a RW. Pt c/o moderate-severe pain at drain site. Discussed d/c plan-pt will return home. He declines PT f/u. Recommend daily ambulation in hallway with nursing/family assistance, in addition to PT, to increase activity.     Follow Up Recommendations No PT follow up(pt declines PT f/u)    Equipment Recommendations  None recommended by PT    Recommendations for Other Services       Precautions / Restrictions Precautions Precautions: Fall Restrictions Weight Bearing Restrictions: No      Mobility  Bed Mobility Overal bed mobility: Needs Assistance Bed Mobility: Supine to Sit     Supine to sit: HOB elevated     General bed mobility comments: Increased time. Some assist for trunk. Cues for technique.   Transfers Overall transfer level: Needs assistance Equipment used: Rolling walker (2 wheeled) Transfers: Sit to/from Stand Sit to Stand: Min assist;From elevated surface         General transfer comment: VCs safety, hand placement. Pt preferred to pull up on walker.   Ambulation/Gait Ambulation/Gait assistance: Min guard Gait Distance (Feet): 50 Feet Assistive device: Rolling walker (2 wheeled) Gait Pattern/deviations: Step-through pattern;Trunk flexed     General Gait Details: Cues for posture. Close guard for safety. Pt relied on walker for support.   Stairs            Wheelchair Mobility    Modified Rankin (Stroke Patients Only)       Balance Overall balance assessment: Mild deficits observed, not formally tested                                            Pertinent Vitals/Pain Pain Assessment: Faces Faces Pain Scale: Hurts even more Pain Location: R flank @ drain site Pain Descriptors / Indicators: Sore;Aching Pain Intervention(s): Monitored during session;Limited activity within patient's tolerance    Home Living Family/patient expects to be discharged to:: Private residence Living Arrangements: Spouse/significant other Available Help at Discharge: Family Type of Home: House       Home Layout: One level Home Equipment: Environmental consultant - 2 wheels;Cane - single point      Prior Function Level of Independence: Independent               Hand Dominance        Extremity/Trunk Assessment   Upper Extremity Assessment Upper Extremity Assessment: Overall WFL for tasks assessed    Lower Extremity Assessment Lower Extremity Assessment: Overall WFL for tasks assessed    Cervical / Trunk Assessment Cervical / Trunk Assessment: Normal  Communication   Communication: No difficulties  Cognition Arousal/Alertness: Awake/alert Behavior During Therapy: WFL for tasks assessed/performed Overall Cognitive Status: Within Functional Limits for tasks assessed                                        General Comments      Exercises     Assessment/Plan    PT Assessment Patient needs  continued PT services  PT Problem List Decreased strength;Decreased mobility;Decreased balance;Decreased activity tolerance;Decreased knowledge of use of DME;Pain       PT Treatment Interventions Gait training;DME instruction;Functional mobility training;Therapeutic activities;Balance training;Patient/family education;Therapeutic exercise    PT Goals (Current goals can be found in the Care Plan section)  Acute Rehab PT Goals Patient Stated Goal: less pain PT Goal Formulation: With patient Time For Goal Achievement: 07/12/18 Potential to Achieve Goals: Good    Frequency Min 3X/week   Barriers to discharge         Co-evaluation               AM-PAC PT "6 Clicks" Daily Activity  Outcome Measure Difficulty turning over in bed (including adjusting bedclothes, sheets and blankets)?: A Lot Difficulty moving from lying on back to sitting on the side of the bed? : Unable Difficulty sitting down on and standing up from a chair with arms (e.g., wheelchair, bedside commode, etc,.)?: Unable Help needed moving to and from a bed to chair (including a wheelchair)?: A Little Help needed walking in hospital room?: A Little Help needed climbing 3-5 steps with a railing? : A Little 6 Click Score: 13    End of Session   Activity Tolerance: Patient limited by pain Patient left: in chair;with call bell/phone within reach;with family/visitor present   PT Visit Diagnosis: Pain;Unsteadiness on feet (R26.81) Pain - part of body: (R flank drain site)    Time: 0092-3300 PT Time Calculation (min) (ACUTE ONLY): 22 min   Charges:   PT Evaluation $PT Eval Moderate Complexity: [place], PT Acute Rehabilitation Services Pager: [phone] Office: [phone]

## 2018-06-28 NOTE — Progress Notes (Addendum)
Referring Physician(s): Dr. Excell Seltzer  Supervising Physician: Marybelle Killings  Patient Status:  [facility] - In-pt  Chief Complaint: Abdominal pain  Subjective: Ordering lunch, however states he is nauseated.   Allergies: Percocet [oxycodone-acetaminophen]; Hydrocodone; and Oxycodone  Medications: Prior to Admission medications   Medication Sig Start Date End Date Taking? Authorizing Provider  calcipotriene (DOVONOX) 0.005 % ointment Apply topically 2 (two) times daily.   Yes [provider]  Darbepoetin Alfa (ARANESP) 300 MCG/0.6ML SOSY injection Inject 300 mcg into the skin every 30 (thirty) days. As needed based on blood counts   Yes [provider]  desmopressin (DDAVP) 0.2 MG tablet Take 0.2 mg by mouth daily.   Yes [provider]  finasteride (PROPECIA) 1 MG tablet Take 1 mg by mouth daily with breakfast.    Yes [provider]  levothyroxine (SYNTHROID, LEVOTHROID) 175 MCG tablet Take 175 mcg by mouth at bedtime.    Yes [provider]  Lutein 40 MG CAPS Take 40 mg by mouth daily with breakfast.  10/09/15  Yes [provider]  Multiple Vitamins-Minerals (CENTRUM ADULTS PO) Take 1 tablet by mouth daily.    Yes [provider]  omeprazole (PRILOSEC) 20 MG capsule Take 20 mg by mouth daily.   Yes [provider]  traMADol (ULTRAM) 50 MG tablet Take 50 mg by mouth every 6 (six) hours as needed.   Yes [provider]  triamcinolone (KENALOG) 0.025 % cream Apply 1 application topically 2 (two) times daily as needed (for inflammation).    Yes [provider]     Vital Signs: BP (!) 102/49 (BP Location: Right Arm)   Pulse 85   Temp 98.1 F (36.7 C) (Oral)   Resp 20   SpO2 93%   Physical Exam  NAD, alert, resting comfortably in chair Abdomen: Soft, tender at drain insertion site.  Drain intact; insertion site c/d/i. Small amount of serous-appearing output.  Imaging: Nm Hepatobiliary Liver  Func  Result Date: 06/26/2018 CLINICAL DATA:  Abdominal pain with abnormal CT and ultrasound EXAM: NUCLEAR MEDICINE HEPATOBILIARY IMAGING TECHNIQUE: Sequential images of the abdomen were obtained out to 60 minutes following intravenous administration of radiopharmaceutical. 2.7 mg of intravenous morphine administered after 1 hour and nonvisualization of the gallbladder. Additional images were acquired for 30 minutes. RADIOPHARMACEUTICALS:  5.27 mCi Tc-47m  Choletec IV COMPARISON:  CT and ultrasound 06/25/2018 FINDINGS: Due to equipment malfunction, static frames from initial phase of the HIDA scan were not able to be transferred to PACs. A single static image is available which demonstrates bowel activity but no definite gallbladder activity. Following morphine augmentation, there is filling of the gallbladder. Homogeneous radiotracer activity in the liver. IMPRESSION: 1. Gallbladder visualization following morphine augmentation suggests patency of the cystic duct. Findings on recent imaging studies could be secondary to chronic cholecystitis. 2. Normal biliary to bowel transit. Electronically Signed   By: Donavan Foil M.D.   On: 06/26/2018 22:15   Ct Abdomen Pelvis W Contrast  Result Date: 06/25/2018 CLINICAL DATA:  Abdominal pain. Diarrhea. Constipation. RIGHT lower quadrant pain. Dark stool. Neutropenia EXAM: CT ABDOMEN AND PELVIS WITH CONTRAST TECHNIQUE: Multidetector CT imaging of the abdomen and pelvis was performed using the standard protocol following bolus administration of intravenous contrast. CONTRAST:  166mL ISOVUE-300 IOPAMIDOL (ISOVUE-300) INJECTION 61% COMPARISON:  CT 03/11/2017 FINDINGS: Lower chest: Focus of scattered small peripheral focus of consolidation air bronchograms in the peripheral RIGHT middle lobe (image 31/6). Diffuse increase in interlobular septal thickening. Hepatobiliary: Several  large hepatic cysts noted. There is new distention of the gallbladder to 5.2 cm. There is  pericholecystic fluid along the distended gallbladder. No radiodense gallstones present. There is mild enhancement of the cystic duct (image 40/2). No dilatation of the common bile duct. Pancreas: Normal pancreatic duct. No pancreatic inflammation. Moderate size periampullary duodenum diverticulum noted measuring 3 cm (image 38/2). Spleen: Normal spleen Adrenals/urinary tract: Adrenal glands normal. Several nonobstructing calculi in the RIGHT kidney and LEFT kidney. Ureters bladder normal. No obstructive uropathy. Small diverticulum extending from the LEFT anterior bladder. Stomach/Bowel: Stomach, small-bowel, cecum normal. Appendix is not clearly identified; however there are no secondary signs of acute appendicitis. On comparison exam the appendix was just beneath the gallbladder. Ascending, transverse, descending colon appear normal. There is some haziness to the mesentery. There is little intra-abdominal fat. These findings make evaluation of bowel difficult. Vascular/Lymphatic: Abdominal aorta is normal caliber with atherosclerotic calcification. There is no retroperitoneal or periportal lymphadenopathy. No pelvic lymphadenopathy. Reproductive: Prostate normal Other: No free fluid or free air Musculoskeletal: No aggressive osseous lesion. IMPRESSION: 1. Distended gallbladder with pericholecystic fluid is most consistent with acute cholecystitis (calculus or a calculus). No biliary duct dilatation. 2. Appendix is not identified. On the comparison exam, the appendix was just beneath the gallbladder therefore cannot exclude secondary gallbladder inflammation from appendicitis, although less favored. 3. Pancreas appears normal. Small periampullary duodenum diverticulum. 4. Bilateral nonobstructing renal calculi. Electronically Signed   By: Suzy Bouchard M.D.   On: 06/25/2018 13:54   Ir Perc Cholecystostomy  Result Date: 06/27/2018 CLINICAL DATA:  Dilated thick-walled gallbladder, pain. Leukemia with  pancytopenia. Poor surgical candidate. Percutaneous drainage requested. EXAM: PERCUTANEOUS CHOLECYSTOSTOMY TUBE PLACEMENT WITH ULTRASOUND AND FLUOROSCOPIC GUIDANCE FLUOROSCOPY TIME:  1.2 minute; 191 uGym2 DAP TECHNIQUE: The procedure, risks (including but not limited to bleeding, infection, organ damage ), benefits, and alternatives were explained to the patient. Questions regarding the procedure were encouraged and answered. The patient understands and consents to the procedure. As antibiotic prophylaxis, Zosyn was ordered pre-procedure and administered intravenously within one hour of incision.Survey ultrasound of the abdomen was performed and an appropriate skin entry site was identified. Skin site was marked, prepped with chlorhexidine, and draped in usual sterile fashion, and infiltrated locally with 1% lidocaine. Intravenous Fentanyl and Versed were administered as conscious sedation during continuous monitoring of the patient's level of consciousness and physiological / cardiorespiratory status by the radiology RN, with a total moderate sedation time of 12 minutes. Under real-time ultrasound guidance, gallbladder was accessed using a transhepatic approach with a 21-gauge needle. Ultrasound image documentation was saved. Bile returned through the hub. Needle was exchanged over a 018 guidewire for transitional dilator which allowed placement of 035 J wire. Over this, a 10.2 French pigtail catheter was advanced and formed centrally in the gallbladder lumen. 20 mL bilious aspirate were removed, sent for Gram stain and culture. Small contrast injection confirmed appropriate position. Catheter secured externally with 0 Prolene suture and placed external drain bag. Patient tolerated the procedure well. COMPLICATIONS: COMPLICATIONS none IMPRESSION: 1. Technically successful percutaneous cholecystostomy tube placement with ultrasound and fluoroscopic guidance. Electronically Signed   By: Lucrezia Europe M.D.   On:  06/27/2018 16:07   US Abdomen Limited Ruq  Result Date: 06/25/2018 CLINICAL DATA:  Right upper quadrant pain EXAM: ULTRASOUND ABDOMEN LIMITED RIGHT UPPER QUADRANT COMPARISON:  CT 06/25/2018 FINDINGS: Gallbladder: Small to moderate sludge within the gallbladder, punctate echodense foci within the sludge, questionable for punctate stones. Positive sonographic Murphy. Increased wall thickness  at 7.2 mm with vascularity. Common bile duct: Diameter: Slightly enlarged at 7 mm Liver: Septated cyst in the liver measuring 4.8 x 4.9 x 4.4 cm. Additional smaller cyst in the posterior right hepatic lobe measuring 3 x 3.2 x 1.8 cm. Trace perihepatic fluid. Portal vein is patent on color Doppler imaging with normal direction of blood flow towards the liver. IMPRESSION: 1. Dilated gallbladder with moderate sludge, vascular wall thickening, and positive sonographic Murphy sign, findings are consistent with an acute cholecystitis. 2. Slightly enlarged common bile duct at 7 mm, correlate with LFT with follow-up MRCP as indicated 3. Small amount of perihepatic free fluid.  Cysts within the liver Electronically Signed   By: Donavan Foil M.D.   On: 06/25/2018 15:11    Labs:  CBC: Recent Labs    06/25/18 1058 06/26/18 0351 06/26/18 1529 06/27/18 0356 06/28/18 0455  WBC 2.3* 1.9*  --  1.9* 5.3  HGB 7.9* 6.5* 8.1* 8.3* 8.8*  HCT 24.2* 19.8* 24.4* 24.4* 26.5*  PLT 79* 63*  --  68* 72*    COAGS: Recent Labs    06/26/18 0351 06/27/18 0356  INR 1.49 1.52  APTT 44*  --     BMP: Recent Labs    06/25/18 1058 06/26/18 0351 06/27/18 0356 06/28/18 0455  NA 138 135 134* 137  K 3.7 3.5 3.5 3.3*  CL 109 108 108 110  CO2 23 23 23  21*  GLUCOSE 121* 91 91 97  BUN 14 12 9 8   CALCIUM 8.1* 7.4* 7.4* 7.6*  CREATININE 0.70 0.72 0.76 0.79  GFRNONAA >60 >60 >60 >60  GFRAA >60 >60 >60 >60    LIVER FUNCTION TESTS: Recent Labs    01/24/18 0953 06/25/18 1058 06/26/18 0351 06/27/18 0356  BILITOT 0.8 1.2 1.0  1.2  AST 18 18 14* 17  ALT 12 15 14 15   ALKPHOS 60 58 47 47  PROT 7.6 7.4 6.1* 6.2*  ALBUMIN 3.9 3.3* 2.6* 2.6*    Assessment and Plan: Abdominal pain, acute cholecystitis Patient with suspected cholecystitis, s/p drain placement yesterday by Dr. Vernard Gambles.  Patient with abdominal pain and nausea today, but states he is overall feeling better.  Note WBC increased from 1.9 to 5.3.  Culture pending, but showing few gram negative rods. Remains afebrile.  Drain site is tender but intact.  Continue current management.   Electronically Signed: Docia Barrier, PA 06/28/2018, 1:28 PM   I spent a total of 15 Minutes at the the patient's bedside AND on the patient's hospital floor or unit, greater than 50% of which was counseling/coordinating care for abdominal pain.

## 2018-06-28 NOTE — Progress Notes (Signed)
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 Pain was sharp and did not radiate. Patient reports that the pain would wax and wane. Patient was unable to tolerate p.o. due to decreased appetite and significant amounts of nausea. Patient did have some diarrhea. He did not have any emesis. He denies any cough, congestion, rhinorrhea, fevers, dysuria, constipation. 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 He is followed at [facility] for this primarily.  06/27/2018: Patient seen and examined with his wife at bedside.  Reports pain at his right upper quadrant worse with movement.  General surgery consulted and following.  High risk for surgery.  Possible percutaneous drainage by interventional radiology.  06/28/2018: Patient examined with his wife at bedside.  Reports significant pain in his right upper quadrant abdomen at site of drain.  He just took his tramadol.  Assessment/Plan: Active Problems:   Meniere's syndrome   MDS (myelodysplastic syndrome) (HCC)   Hypothyroidism   Acute cholecystitis   RUQ pain  Acute cholecystitis status post drain placement by interventional radiology Acute cholecystitis on CT scan; HIDA scan unrevealing; High risk for surgery Wound culture revealing few gram-negative rods Blood cultures x2 no growth in 3 days Pain management and bowel regimen in place IV Dilaudid for severe pain as needed Tramadol for moderate pain as needed Tylenol for mild pain Senokot 2 tablets twice daily  and MiraLAX daily bowel regimen  Mild hypokalemia Potassium 3.3 Repleted with KCl 40 mEq once Repeat BMP  Hypophosphatemia Phosphorus 2.0 Repleted with sodium phosphorus supplement Repeat phosphorus level  Bilateral nonobstructing renal calculi.  MDS transformed into acute leukemia followed at [facility].  Discussed with Dr. Earlie Server.  Poor prognosis.  He recommended hospice however patient's wife declines.   Resolved leukopenia post Granix infusion WBC 5.3 from 1.9 Per Dr. Earlie Server can start Granix 300 mcg subcu daily DC Granix tomorrow 10-19 Repeat CBC in the morning  Nocturia-continue DDAVP.  Hypothyroidism continue levothyroxine.  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  Per Dr. Earlie Server he has required multiple blood transfusions in the past Hemoglobin stable at 8.3 Repeat CBC in the morning  Pancytopenia/thrombocytopenia Platelets 72K from 68k No sign of overt bleeding  Cachexia/physical debility/severe protein calorie malnutrition Oral supplement Encourage oral intake as tolerated Fall precautions Patient declines PT follow-up       Code Status: DNR  Family Communication: Wife at bedside.  Disposition Plan: Home when clinically stable   Consultants:  General surgery  Procedures:  None  Antimicrobials: IV Zosyn  DVT prophylaxis: SCDs   Objective: Vitals:   06/27/18 2138 06/28/18 0333 06/28/18 0357 06/28/18 0830  BP: (!) 112/54 (!) 104/55  (!) 102/49  Pulse:   85 85  Resp:   20   Temp:   98.3 F (36.8 C) 98.1 F (36.7 C)  TempSrc:   Oral Oral  SpO2:   93% 93%    Intake/Output Summary (Last 24 hours) at 06/28/2018 1709 Last data filed at 06/28/2018 1433 Gross per 24 hour  Intake 2409.17 ml  Output 1875 ml  Net 534.17 ml   There were no vitals filed for this visit.  Exam:  . General: 84 y.o. year-old male frail in no acute distress.  Alert and  interactive. . Cardiovascular: Regular rate and rhythm with no rubs or gallops.  No thyromegaly or JVD noted. Marland Kitchen Respiratory: Clear to auscultation with no wheezes or rales. Good inspiratory effort. . Abdomen: Soft tenderness on palpation of right upper quadrant.  Nondistended with normal bowel sounds x4 quadrants. . Musculoskeletal: No lower extremity edema. 2/4 pulses in all 4 extremities. Marland Kitchen Psychiatry: Mood is appropriate for condition and setting   Data Reviewed: CBC: Recent Labs  Lab 06/25/18 1058 06/26/18 0351 06/26/18 1529 06/27/18 0356 06/28/18 0455  WBC 2.3* 1.9*  --  1.9* 5.3  NEUTROABS 1.2*  --   --   --   --   HGB 7.9* 6.5* 8.1* 8.3* 8.8*  HCT 24.2* 19.8* 24.4* 24.4* 26.5*  MCV 89.0 90.0  --  89.4 89.5  PLT 79* 63*  --  68* 72*   Basic Metabolic Panel: Recent Labs  Lab 06/25/18 1058 06/26/18 0351 06/27/18 0356 06/28/18 0455  NA 138 135 134* 137  K 3.7 3.5 3.5 3.3*  CL 109 108 108 110  CO2 23 23 23  21*  GLUCOSE 121* 91 91 97  BUN 14 12 9 8   CREATININE 0.70 0.72 0.76 0.79  CALCIUM 8.1* 7.4* 7.4* 7.6*  MG  --   --   --  1.9  PHOS  --   --   --  2.0*   GFR: CrCl cannot be calculated (Unknown ideal weight.). Liver Function Tests: Recent Labs  Lab 06/25/18 1058 06/26/18 0351 06/27/18 0356  AST 18 14* 17  ALT 15 14 15   ALKPHOS 58 47 47  BILITOT 1.2 1.0 1.2  PROT 7.4 6.1* 6.2*  ALBUMIN 3.3* 2.6* 2.6*   Recent Labs  Lab 06/25/18 1058  LIPASE 39   No results for input(s): AMMONIA in the last 168 hours. Coagulation Profile: Recent Labs  Lab 06/26/18 0351 06/27/18 0356  INR 1.49 1.52   Cardiac Enzymes: No results for input(s): CKTOTAL, CKMB, CKMBINDEX, TROPONINI in the last 168 hours. BNP (last 3 results) No results for input(s): PROBNP in the last 8760 hours. HbA1C: No results for input(s): HGBA1C in the last 72 hours. CBG: No results for input(s): GLUCAP in the last 168 hours. Lipid Profile: No results for input(s): CHOL, HDL, LDLCALC,  TRIG, CHOLHDL, LDLDIRECT in the last 72 hours. Thyroid Function Tests: No results for input(s): TSH, T4TOTAL, FREET4, T3FREE, THYROIDAB in the last 72 hours. Anemia Panel: No results for input(s): VITAMINB12, FOLATE, FERRITIN, TIBC, IRON, RETICCTPCT in the last 72 hours. Urine analysis:    Component Value Date/Time   COLORURINE AMBER (A) 06/25/2018 1023   APPEARANCEUR CLEAR 06/25/2018 1023   LABSPEC 1.021 06/25/2018 1023   PHURINE 6.0 06/25/2018 1023   GLUCOSEU NEGATIVE 06/25/2018 1023   HGBUR NEGATIVE 06/25/2018 1023   BILIRUBINUR NEGATIVE 06/25/2018 1023   KETONESUR NEGATIVE 06/25/2018 1023   PROTEINUR NEGATIVE 06/25/2018 1023   NITRITE NEGATIVE 06/25/2018 1023   LEUKOCYTESUR NEGATIVE 06/25/2018 1023   Sepsis Labs: @LABRCNTIP (procalcitonin:4,lacticidven:4)  ) Recent Results (from the past 240 hour(s))  Blood culture (routine x 2)     Status: None (Preliminary result)   Collection Time: 06/25/18  4:10 PM  Result Value Ref Range Status   Specimen Description   Final    BLOOD BLOOD LEFT FOREARM Performed at [facility] [place]  [address] [place], [place] [address]    Special Requests   Final    BOTTLES DRAWN AEROBIC AND ANAEROBIC Blood Culture results may not be optimal due to an excessive volume of blood received in culture bottles Performed at [facility] [address] [place], [place] [address]    Culture   Final    NO GROWTH 3 DAYS Performed at [facility], [place] [address] [place], [place] [address]    Report Status PENDING  Incomplete  Blood culture (routine x 2)     Status: None (Preliminary result)   Collection Time: 06/25/18  4:12 PM  Result Value Ref Range Status   Specimen Description   Final    BLOOD RIGHT ANTECUBITAL Performed at [facility] [address] [place], [place] [address]    Special Requests   Final    BOTTLES DRAWN AEROBIC AND ANAEROBIC Blood Culture adequate volume Performed  at [facility] [address] [place], [place] [address]    Culture   Final    NO GROWTH 3 DAYS Performed at [facility], [place] [address] [place], [place] [address]    Report Status PENDING  Incomplete  Aerobic/Anaerobic Culture (surgical/deep wound)     Status: None (Preliminary result)   Collection Time: 06/27/18  4:07 PM  Result Value Ref Range Status   Specimen Description   Final    BILE GALLBLADDER Performed at [facility] [address], [place], [place] [address]    Special Requests   Final    Immunocompromised Performed at [facility], [place] [address], [place], [place] [address]    Gram Stain   Final    RARE WBC PRESENT, PREDOMINANTLY MONONUCLEAR FEW GRAM NEGATIVE RODS Performed at [facility], [place] [address] [place],  [address]    Culture FEW GRAM NEGATIVE RODS  Final   Report Status PENDING  Incomplete      Studies: No results found.  Scheduled Meds: . sodium chloride   Intravenous Once  . acetaminophen  650 mg Oral Q6H  . calcipotriene   Topical BID  . desmopressin  0.2 mg Oral QHS  . finasteride  1 mg Oral Q breakfast  . levothyroxine  175 mcg Oral QHS  . multivitamin  1 tablet Oral Q breakfast  . multivitamin with minerals  1 tablet Oral Daily  . ondansetron (ZOFRAN) IV  4 mg Intravenous Once  . pantoprazole  40 mg Oral Daily  . polyethylene glycol  17 g Oral Daily  . senna-docusate  2 tablet Oral BID  . sodium chloride flush  3 mL Intravenous Q12H  . sodium chloride flush  5 mL Intracatheter Q8H  . Tbo-filgastrim (GRANIX) SQ  300 mcg Subcutaneous q1800  . traMADol  100 mg Oral Q6H    Continuous Infusions: . sodium chloride 75 mL/hr at 06/28/18 1400  . piperacillin-tazobactam (ZOSYN)  IV Stopped (06/28/18 1147)     LOS: 3 days     Kayleen Memos, MD [facility] Pager [phone]  If 7PM-7AM, please contact night-coverage [email or web address] Password  [email or web address] 06/28/2018, 5:09 PM

## 2018-06-28 NOTE — Care Management Important Message (Signed)
Important Message  Patient Details  Name: Dean Stanley MRN: [id number] Date of Birth: Mar 14, 1931   Medicare Important Message Given:  Yes    Kerin Salen 06/28/2018, 12:07 [place] Message  Patient Details  Name: Dean Stanley MRN: [id number] Date of Birth: [birth year]   Medicare Important Message Given:  Yes    Kerin Salen 06/28/2018, 12:07 PM

## 2018-06-28 NOTE — Progress Notes (Signed)
[facility]/Trauma Progress Note      Assessment/Plan Active Problems:   Meniere's syndrome   MDS (myelodysplastic syndrome) (HCC)   Hypothyroidism   Acute cholecystitis   RUQ pain  Cholecystitis  - S/P perc chole drain 09/30 - pt still having significant pain, I have scheduled tramadol and tylenol - IS  FEN: reg diet VTE: SCD's, per medicine ID: Zosyn 09/28>> Foley: none Follow up: TBD  DISPO: continue IV abx. Scheduled PO pain meds. Ambulate. IS    LOS: 3 days    Subjective: CC: abdominal pain  Pt states pain is about the same. Not worse. Pain worse with movement or cough. No nausea, vomiting, fever, or chills. Having flatus. Wife at bedside.   Objective: Vital signs in last 24 hours: Temp:  [97.6 F (36.4 C)-98.8 F (37.1 C)] 98.1 F (36.7 C) (10/01 0830) Pulse Rate:  [65-85] 85 (10/01 0830) Resp:  [12-20] 20 (10/01 0357) BP: (100-112)/(48-68) 102/49 (10/01 0830) SpO2:  [93 %-100 %] 93 % (10/01 0830) Last BM Date: 06/24/18  Intake/Output from previous day: 09/30 0701 - 10/01 0700 In: 2984.2 [P.O.:180; I.V.:2694.1; IV Piggyback:100.1] Out: 1800 [Urine:1475; Drains:325] Intake/Output this shift: Total I/O In: 180 [P.O.:180] Out: 200 [Urine:100; Drains:100]  PE: Gen:  Alert, NAD, pleasant, cooperative Pulm:  Rate and effort normal Abd: Soft, not distended, +BS, RUQ drain with dark green/brown liquid in bag, TTP of right hemiabdomen, + guarding, no peritonitis  Skin: no rashes noted, warm and dry   Anti-infectives: Anti-infectives (From admission, onward)   Start     Dose/Rate Route Frequency Ordered Stop   06/27/18 1502  piperacillin-tazobactam (ZOSYN) 3.375 GM/50ML IVPB    Note to Pharmacy:  Hilma Favors   : cabinet override      06/27/18 1502 06/28/18 0314   06/26/18 0000  piperacillin-tazobactam (ZOSYN) IVPB 3.375 g     3.375 g 12.5 mL/hr over 240 Minutes Intravenous Every 8 hours 06/25/18 1748     06/25/18 1730   piperacillin-tazobactam (ZOSYN) IVPB 3.375 g  Status:  Discontinued     3.375 g 100 mL/hr over 30 Minutes Intravenous Every 8 hours 06/25/18 1726 06/25/18 1747   06/25/18 1530  piperacillin-tazobactam (ZOSYN) IVPB 3.375 g     3.375 g 100 mL/hr over 30 Minutes Intravenous  Once 06/25/18 1521 06/25/18 1657      Lab Results:  Recent Labs    06/27/18 0356 06/28/18 0455  WBC 1.9* 5.3  HGB 8.3* 8.8*  HCT 24.4* 26.5*  PLT 68* 72*   BMET Recent Labs    06/27/18 0356 06/28/18 0455  NA 134* 137  K 3.5 3.3*  CL 108 110  CO2 23 21*  GLUCOSE 91 97  BUN 9 8  CREATININE 0.76 0.79  CALCIUM 7.4* 7.6*   PT/INR Recent Labs    06/26/18 0351 06/27/18 0356  LABPROT 17.9* 18.2*  INR 1.49 1.52   CMP     Component Value Date/Time   NA 137 06/28/2018 0455   NA 134 (L) 09/29/2017 1134   K 3.3 (L) 06/28/2018 0455   K 4.1 09/29/2017 1134   CL 110 06/28/2018 0455   CO2 21 (L) 06/28/2018 0455   CO2 26 09/29/2017 1134   GLUCOSE 97 06/28/2018 0455   GLUCOSE 105 09/29/2017 1134   BUN 8 06/28/2018 0455   BUN 14.8 09/29/2017 1134   CREATININE 0.79 06/28/2018 0455   CREATININE 0.85 01/24/2018 0953   CREATININE 0.8 09/29/2017 1134   CALCIUM 7.6 (L) 06/28/2018 0455  CALCIUM 8.8 09/29/2017 1134   PROT 6.2 (L) 06/27/2018 0356   PROT 7.0 09/29/2017 1134   ALBUMIN 2.6 (L) 06/27/2018 0356   ALBUMIN 3.8 09/29/2017 1134   AST 17 06/27/2018 0356   AST 18 01/24/2018 0953   AST 13 09/29/2017 1134   ALT 15 06/27/2018 0356   ALT 12 01/24/2018 0953   ALT 12 09/29/2017 1134   ALKPHOS 47 06/27/2018 0356   ALKPHOS 41 09/29/2017 1134   BILITOT 1.2 06/27/2018 0356   BILITOT 0.8 01/24/2018 0953   BILITOT 1.37 (H) 09/29/2017 1134   GFRNONAA >60 06/28/2018 0455   GFRNONAA >60 01/24/2018 0953   GFRAA >60 06/28/2018 0455   GFRAA >60 01/24/2018 0953   Lipase     Component Value Date/Time   LIPASE 39 06/25/2018 1058    Studies/Results: Nm Hepatobiliary Liver Func  Result Date:  06/26/2018 CLINICAL DATA:  Abdominal pain with abnormal CT and ultrasound EXAM: NUCLEAR MEDICINE HEPATOBILIARY IMAGING TECHNIQUE: Sequential images of the abdomen were obtained out to 60 minutes following intravenous administration of radiopharmaceutical. 2.7 mg of intravenous morphine administered after 1 hour and nonvisualization of the gallbladder. Additional images were acquired for 30 minutes. RADIOPHARMACEUTICALS:  5.27 mCi Tc-41m  Choletec IV COMPARISON:  CT and ultrasound 06/25/2018 FINDINGS: Due to equipment malfunction, static frames from initial phase of the HIDA scan were not able to be transferred to PACs. A single static image is available which demonstrates bowel activity but no definite gallbladder activity. Following morphine augmentation, there is filling of the gallbladder. Homogeneous radiotracer activity in the liver. IMPRESSION: 1. Gallbladder visualization following morphine augmentation suggests patency of the cystic duct. Findings on recent imaging studies could be secondary to chronic cholecystitis. 2. Normal biliary to bowel transit. Electronically Signed   By: Donavan Foil M.D.   On: 06/26/2018 22:15   Ir Perc Cholecystostomy  Result Date: 06/27/2018 CLINICAL DATA:  Dilated thick-walled gallbladder, pain. Leukemia with pancytopenia. Poor surgical candidate. Percutaneous drainage requested. EXAM: PERCUTANEOUS CHOLECYSTOSTOMY TUBE PLACEMENT WITH ULTRASOUND AND FLUOROSCOPIC GUIDANCE FLUOROSCOPY TIME:  1.2 minute; 191 uGym2 DAP TECHNIQUE: The procedure, risks (including but not limited to bleeding, infection, organ damage ), benefits, and alternatives were explained to the patient. Questions regarding the procedure were encouraged and answered. The patient understands and consents to the procedure. As antibiotic prophylaxis, Zosyn was ordered pre-procedure and administered intravenously within one hour of incision.Survey ultrasound of the abdomen was performed and an appropriate skin  entry site was identified. Skin site was marked, prepped with chlorhexidine, and draped in usual sterile fashion, and infiltrated locally with 1% lidocaine. Intravenous Fentanyl and Versed were administered as conscious sedation during continuous monitoring of the patient's level of consciousness and physiological / cardiorespiratory status by the radiology RN, with a total moderate sedation time of 12 minutes. Under real-time ultrasound guidance, gallbladder was accessed using a transhepatic approach with a 21-gauge needle. Ultrasound image documentation was saved. Bile returned through the hub. Needle was exchanged over a 018 guidewire for transitional dilator which allowed placement of 035 J wire. Over this, a 10.2 French pigtail catheter was advanced and formed centrally in the gallbladder lumen. 20 mL bilious aspirate were removed, sent for Gram stain and culture. Small contrast injection confirmed appropriate position. Catheter secured externally with 0 Prolene suture and placed external drain bag. Patient tolerated the procedure well. COMPLICATIONS: COMPLICATIONS none IMPRESSION: 1. Technically successful percutaneous cholecystostomy tube placement with ultrasound and fluoroscopic guidance. Electronically Signed   By: Lucrezia Europe M.D.   On:  06/27/2018 16:07      Kalman Drape , [facility] 06/28/2018, 10:06 AM  Pager: [phone] Mon-Wed, Friday 7:00am-4:30pm Thurs 7am-11:30am  Consults: [phone]

## 2018-06-29 DIAGNOSIS — D469 Myelodysplastic syndrome, unspecified: Secondary | ICD-10-CM

## 2018-06-29 DIAGNOSIS — R1011 Right upper quadrant pain: Secondary | ICD-10-CM

## 2018-06-29 DIAGNOSIS — K810 Acute cholecystitis: Principal | ICD-10-CM

## 2018-06-29 DIAGNOSIS — E039 Hypothyroidism, unspecified: Secondary | ICD-10-CM

## 2018-06-29 LAB — BASIC METABOLIC PANEL
Anion gap: 6 (ref 5–15)
BUN: 11 mg/dL (ref 8–23)
CALCIUM: 7.7 mg/dL — AB (ref 8.9–10.3)
CHLORIDE: 109 mmol/L (ref 98–111)
CO2: 24 mmol/L (ref 22–32)
CREATININE: 0.83 mg/dL (ref 0.61–1.24)
GFR calc non Af Amer: >60 mL/min (ref >60)
Glucose, Bld: 95 mg/dL (ref 70–99)
Potassium: 3.6 mmol/L (ref 3.5–5.1)
SODIUM: 139 mmol/L (ref 135–145)

## 2018-06-29 LAB — CBC WITH DIFFERENTIAL/PLATELET
BASOS ABS: 0.1 10*3/uL (ref 0.0–0.1)
BASOS PCT: 1 %
Eosinophils Absolute: 0 10*3/uL (ref 0.0–0.7)
Eosinophils Relative: 0 %
HCT: 25.6 % — ABNORMAL LOW (ref 39.0–52.0)
Hemoglobin: 8.6 g/dL — ABNORMAL LOW (ref 13.0–17.0)
Lymphocytes Relative: 13 %
Lymphs Abs: 1 10*3/uL (ref 0.7–4.0)
MCH: 30.2 pg (ref 26.0–34.0)
MCHC: 33.6 g/dL (ref 30.0–36.0)
MCV: 89.8 fL (ref 78.0–100.0)
MONO ABS: 1.2 10*3/uL (ref 0.1–1.0)
Monocytes Relative: 16 %
NEUTROS ABS: 5.2 10*3/uL (ref 1.7–7.7)
Neutrophils Relative %: 70 %
Platelets: 77 10*3/uL — ABNORMAL LOW (ref 150–400)
RBC: 2.85 MIL/uL — ABNORMAL LOW (ref 4.22–5.81)
RDW: 16.7 % — AB (ref 11.5–15.5)
WBC: 7.4 10*3/uL (ref 4.0–10.5)

## 2018-06-29 MED ORDER — SODIUM CHLORIDE 0.9 % IV SOLN
2.0000 g | Freq: Three times a day (TID) | INTRAVENOUS | Status: DC
  Administered 2018-06-29 – 2018-06-30 (×4): 2 g via INTRAVENOUS
  Filled 2018-06-29 (×7): qty 2

## 2018-06-29 NOTE — Progress Notes (Signed)
PROGRESS NOTE    Dean Stanley  WSF:[id number] DOB: Aug 15, 1931 DOA: 06/25/2018 PCP: Lorene Dy, MD   Brief Narrative: Dean Stanley is a 84 y.o. 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 He presented with abdominal pain secondary to acute cholecystitis. He is s/p drain placement.   Assessment & Plan:   Active Problems:   Meniere's syndrome   MDS (myelodysplastic syndrome) (HCC)   Hypothyroidism   Acute cholecystitis   RUQ pain   Acute cholecystitis S/p drain placement per IR. High risk for surgery. Enterobacter on culture. Blood culture no growth to date. -Switch Zosyn to Cefepime -Continue Tramadol  Hypokalemia Supplementation  Hypophosphatemia Supplementation  Bilateral non-obstructing calculi Outpatient follow-up  MDS/acute leukemia Follows at [facility] offered. Wife declines per chart review.  Leukopenia Given Granix infusion. Improved.  Nocturia -Continue DDAVP  Hypothyroidism -Continue Synthroid  Pancytopenia In setting of leukemia. Stable   DVT prophylaxis: SCDs Code Status:   Code Status: DNR Family Communication: None at bedside Disposition Plan: Discharge likely in 24 hours if remains stable   Consultants:   General surgery  Interventional radiology  Procedures:   9/30: Percutaneous cholecystostomy  Antimicrobials:  Zosyn  Cefepime    Subjective: Feels well. Some abdominal pain.  Objective: Vitals:   06/28/18 2111 06/29/18 0501 06/29/18 1150 06/29/18 1443  BP: (!) 93/51 (!) 92/59  (!) 98/54  Pulse: 81 77  75  Resp: 16 16    Temp: 98.1 F (36.7 C) 98.4 F (36.9 C)  98.5 F (36.9 C)  TempSrc: Oral Oral  Oral  SpO2: 94% 93%  96%  Weight:   72.6 kg     Intake/Output Summary (Last 24 hours) at 06/29/2018 1701 Last data filed at 06/29/2018 1438 Gross per 24 hour  Intake 1220.62  ml  Output 595 ml  Net 625.62 ml   Filed Weights   06/29/18 1150  Weight: 72.6 kg    Examination:  General exam: Appears calm and comfortable Respiratory system: Clear to auscultation. Respiratory effort normal. Cardiovascular system: S1 & S2 heard, RRR. No murmurs, rubs, gallops or clicks. Gastrointestinal system: Abdomen is nondistended, soft and RUQ tenderness. No organomegaly or masses felt. Normal bowel sounds heard. Central nervous system: Alert and oriented. No focal neurological deficits. Extremities: No edema. No calf tenderness Skin: No cyanosis. No rashes Psychiatry: Judgement and insight appear normal. Mood & affect appropriate.     Data Reviewed: I have personally reviewed following labs and imaging studies  CBC: Recent Labs  Lab 06/25/18 1058 06/26/18 0351 06/26/18 1529 06/27/18 0356 06/28/18 0455 06/29/18 0338  WBC 2.3* 1.9*  --  1.9* 5.3 7.4  NEUTROABS 1.2*  --   --   --   --  5.2  HGB 7.9* 6.5* 8.1* 8.3* 8.8* 8.6*  HCT 24.2* 19.8* 24.4* 24.4* 26.5* 25.6*  MCV 89.0 90.0  --  89.4 89.5 89.8  PLT 79* 63*  --  68* 72* 77*   Basic Metabolic Panel: Recent Labs  Lab 06/25/18 1058 06/26/18 0351 06/27/18 0356 06/28/18 0455 06/29/18 0338  NA 138 135 134* 137 139  K 3.7 3.5 3.5 3.3* 3.6  CL 109 108 108 110 109  CO2 23 23 23  21* 24  GLUCOSE 121* 91 91 97 95  BUN 14 12 9 8 11   CREATININE 0.70 0.72 0.76 0.79 0.83  CALCIUM 8.1* 7.4* 7.4* 7.6* 7.7*  MG  --   --   --  1.9  --  PHOS  --   --   --  2.0*  --    GFR: Estimated Creatinine Clearance: 63 mL/min (by C-G formula based on SCr of 0.83 mg/dL). Liver Function Tests: Recent Labs  Lab 06/25/18 1058 06/26/18 0351 06/27/18 0356  AST 18 14* 17  ALT 15 14 15   ALKPHOS 58 47 47  BILITOT 1.2 1.0 1.2  PROT 7.4 6.1* 6.2*  ALBUMIN 3.3* 2.6* 2.6*   Recent Labs  Lab 06/25/18 1058  LIPASE 39   No results for input(s): AMMONIA in the last 168 hours. Coagulation Profile: Recent Labs  Lab  06/26/18 0351 06/27/18 0356  INR 1.49 1.52   Cardiac Enzymes: No results for input(s): CKTOTAL, CKMB, CKMBINDEX, TROPONINI in the last 168 hours. BNP (last 3 results) No results for input(s): PROBNP in the last 8760 hours. HbA1C: No results for input(s): HGBA1C in the last 72 hours. CBG: No results for input(s): GLUCAP in the last 168 hours. Lipid Profile: No results for input(s): CHOL, HDL, LDLCALC, TRIG, CHOLHDL, LDLDIRECT in the last 72 hours. Thyroid Function Tests: No results for input(s): TSH, T4TOTAL, FREET4, T3FREE, THYROIDAB in the last 72 hours. Anemia Panel: No results for input(s): VITAMINB12, FOLATE, FERRITIN, TIBC, IRON, RETICCTPCT in the last 72 hours. Sepsis Labs: Recent Labs  Lab 06/25/18 1104 06/25/18 1520  LATICACIDVEN 1.44 0.46*    Recent Results (from the past 240 hour(s))  Blood culture (routine x 2)     Status: None (Preliminary result)   Collection Time: 06/25/18  4:10 PM  Result Value Ref Range Status   Specimen Description   Final    BLOOD BLOOD LEFT FOREARM Performed at [facility] [address], [place], [place] [address]    Special Requests   Final    BOTTLES DRAWN AEROBIC AND ANAEROBIC Blood Culture results may not be optimal due to an excessive volume of blood received in culture bottles Performed at [facility] [address] [place], [place] [address]    Culture   Final    NO GROWTH 4 DAYS Performed at [facility], [place] [address], [place], [place] [address]    Report Status PENDING  Incomplete  Blood culture (routine x 2)     Status: None (Preliminary result)   Collection Time: 06/25/18  4:12 PM  Result Value Ref Range Status   Specimen Description   Final    BLOOD RIGHT ANTECUBITAL Performed at [facility] [address], [place], [place] [address]    Special Requests   Final    BOTTLES DRAWN AEROBIC AND ANAEROBIC Blood Culture adequate volume Performed at  [facility] [address], [place], [place] [address]    Culture   Final    NO GROWTH 4 DAYS Performed at [facility], [place] [address], [place], [place] [address]    Report Status PENDING  Incomplete  Aerobic/Anaerobic Culture (surgical/deep wound)     Status: None (Preliminary result)   Collection Time: 06/27/18  4:07 PM  Result Value Ref Range Status   Specimen Description   Final    BILE GALLBLADDER Performed at [facility] [address] [place], [place] [address]    Special Requests   Final    Immunocompromised Performed at [facility], [place] [address] [place] [address] [place], [place] [address]    Gram Stain   Final    RARE WBC PRESENT, PREDOMINANTLY MONONUCLEAR FEW GRAM NEGATIVE RODS Performed at [facility]  [facility], [place] [address], [place], [place] [address]    Culture   Final    FEW ENTEROBACTER SPECIES NO ANAEROBES ISOLATED; CULTURE IN PROGRESS FOR 5 DAYS    Report Status PENDING  Incomplete   Organism ID, Bacteria ENTEROBACTER SPECIES  Final      Susceptibility   Enterobacter species - MIC*    CEFAZOLIN >=64 RESISTANT Resistant     CEFEPIME 2 SENSITIVE Sensitive     CEFTAZIDIME >=64 RESISTANT Resistant     CEFTRIAXONE >=64 RESISTANT Resistant     CIPROFLOXACIN <=0.25 SENSITIVE Sensitive     GENTAMICIN <=1 SENSITIVE Sensitive     IMIPENEM 0.5 SENSITIVE Sensitive     TRIMETH/SULFA <=20 SENSITIVE Sensitive     PIP/TAZO >=128 RESISTANT Resistant     * FEW ENTEROBACTER SPECIES         Radiology Studies: No results found.      Scheduled Meds: . acetaminophen  650 mg Oral Q6H  . calcipotriene   Topical BID  . desmopressin  0.2 mg Oral QHS  . finasteride  1 mg Oral Q breakfast  . levothyroxine  175 mcg Oral QHS  . multivitamin  1 tablet Oral Q breakfast  . multivitamin with minerals  1 tablet Oral Daily  . ondansetron (ZOFRAN) IV  4 mg Intravenous Once  . pantoprazole  40 mg Oral Daily  .  polyethylene glycol  17 g Oral Daily  . senna-docusate  2 tablet Oral BID  . sodium chloride flush  3 mL Intravenous Q12H  . sodium chloride flush  5 mL Intracatheter Q8H  . Tbo-filgastrim (GRANIX) SQ  300 mcg Subcutaneous q1800  . traMADol  100 mg Oral Q6H   Continuous Infusions: . ceFEPime (MAXIPIME) IV 2 g (06/29/18 1325)     LOS: 4 days     Cordelia Poche, MD [facility] 06/29/2018, 5:01 PM Pager: [phone]  If 7PM-7AM, please contact night-coverage [email or web address] 06/29/2018, 5:01 PM

## 2018-06-29 NOTE — Progress Notes (Signed)
[facility]/Trauma Progress Note      Assessment/Plan Active Problems:   Meniere's syndrome   MDS (myelodysplastic syndrome) (HCC)   Hypothyroidism   Acute cholecystitis   RUQ pain  Cholecystitis  - S/P perc chole drain 09/30 - improved pain - IS  FEN: reg diet VTE: SCD's, per medicine ID: Zosyn 09/28>> Foley: none Follow up: TBD  DISPO: continue IV abx. Scheduled PO pain meds. Ambulate. IS. PO abx at discharge.    LOS: 4 days    Subjective: CC: abdominal pain  Overall pt is feeling better and pain is much improved. He is tolerating his diet. Very little flatus and no BM. Pt states a hx of constipation. Wife at bedside states she is not comfortable with him going home today.   Objective: Vital signs in last 24 hours: Temp:  [98.1 F (36.7 C)-98.8 F (37.1 C)] 98.4 F (36.9 C) (10/02 0501) Pulse Rate:  [72-81] 77 (10/02 0501) Resp:  [16-18] 16 (10/02 0501) BP: (92-101)/(51-59) 92/59 (10/02 0501) SpO2:  [93 %-94 %] 93 % (10/02 0501) Last BM Date: 06/24/18  Intake/Output from previous day: 10/01 0701 - 10/02 0700 In: 1747.9 [P.O.:580; I.V.:899.9; IV Piggyback:208] Out: 970 [Urine:525; Drains:445] Intake/Output this shift: Total I/O In: 291.3 [P.O.:240; IV Piggyback:51.3] Out: 0   PE: Gen:  Alert, NAD, pleasant, cooperative Pulm:  Rate and effort normal Abd: Soft, not distended, +BS, RUQ drain with dark green liquid in bag, improved TTP of right hemiabdomen, no guarding, no peritonitis  Skin: no rashes noted, warm and dry   Anti-infectives: Anti-infectives (From admission, onward)   Start     Dose/Rate Route Frequency Ordered Stop   06/27/18 1502  piperacillin-tazobactam (ZOSYN) 3.375 GM/50ML IVPB    Note to Pharmacy:  Hilma Favors   : cabinet override      06/27/18 1502 06/28/18 0314   06/26/18 0000  piperacillin-tazobactam (ZOSYN) IVPB 3.375 g     3.375 g 12.5 mL/hr over 240 Minutes Intravenous Every 8 hours 06/25/18 1748     06/25/18 1730  piperacillin-tazobactam (ZOSYN) IVPB 3.375 g  Status:  Discontinued     3.375 g 100 mL/hr over 30 Minutes Intravenous Every 8 hours 06/25/18 1726 06/25/18 1747   06/25/18 1530  piperacillin-tazobactam (ZOSYN) IVPB 3.375 g     3.375 g 100 mL/hr over 30 Minutes Intravenous  Once 06/25/18 1521 06/25/18 1657      Lab Results:  Recent Labs    06/28/18 0455 06/29/18 0338  WBC 5.3 7.4  HGB 8.8* 8.6*  HCT 26.5* 25.6*  PLT 72* 77*   BMET Recent Labs    06/28/18 0455 06/29/18 0338  NA 137 139  K 3.3* 3.6  CL 110 109  CO2 21* 24  GLUCOSE 97 95  BUN 8 11  CREATININE 0.79 0.83  CALCIUM 7.6* 7.7*   PT/INR Recent Labs    06/27/18 0356  LABPROT 18.2*  INR 1.52   CMP     Component Value Date/Time   NA 139 06/29/2018 0338   NA 134 (L) 09/29/2017 1134   K 3.6 06/29/2018 0338   K 4.1 09/29/2017 1134   CL 109 06/29/2018 0338   CO2 24 06/29/2018 0338   CO2 26 09/29/2017 1134   GLUCOSE 95 06/29/2018 0338   GLUCOSE 105 09/29/2017 1134   BUN 11 06/29/2018 0338   BUN 14.8 09/29/2017 1134   CREATININE 0.83 06/29/2018 0338   CREATININE 0.85 01/24/2018 0953   CREATININE 0.8 09/29/2017 1134   CALCIUM 7.7 (L)  06/29/2018 0338   CALCIUM 8.8 09/29/2017 1134   PROT 6.2 (L) 06/27/2018 0356   PROT 7.0 09/29/2017 1134   ALBUMIN 2.6 (L) 06/27/2018 0356   ALBUMIN 3.8 09/29/2017 1134   AST 17 06/27/2018 0356   AST 18 01/24/2018 0953   AST 13 09/29/2017 1134   ALT 15 06/27/2018 0356   ALT 12 01/24/2018 0953   ALT 12 09/29/2017 1134   ALKPHOS 47 06/27/2018 0356   ALKPHOS 41 09/29/2017 1134   BILITOT 1.2 06/27/2018 0356   BILITOT 0.8 01/24/2018 0953   BILITOT 1.37 (H) 09/29/2017 1134   GFRNONAA >60 06/29/2018 0338   GFRNONAA >60 01/24/2018 0953   GFRAA >60 06/29/2018 0338   GFRAA >60 01/24/2018 0953   Lipase     Component Value Date/Time   LIPASE 39 06/25/2018 1058    Studies/Results: Ir Perc Cholecystostomy  Result Date: 06/27/2018 CLINICAL DATA:  Dilated  thick-walled gallbladder, pain. Leukemia with pancytopenia. Poor surgical candidate. Percutaneous drainage requested. EXAM: PERCUTANEOUS CHOLECYSTOSTOMY TUBE PLACEMENT WITH ULTRASOUND AND FLUOROSCOPIC GUIDANCE FLUOROSCOPY TIME:  1.2 minute; 191 uGym2 DAP TECHNIQUE: The procedure, risks (including but not limited to bleeding, infection, organ damage ), benefits, and alternatives were explained to the patient. Questions regarding the procedure were encouraged and answered. The patient understands and consents to the procedure. As antibiotic prophylaxis, Zosyn was ordered pre-procedure and administered intravenously within one hour of incision.Survey ultrasound of the abdomen was performed and an appropriate skin entry site was identified. Skin site was marked, prepped with chlorhexidine, and draped in usual sterile fashion, and infiltrated locally with 1% lidocaine. Intravenous Fentanyl and Versed were administered as conscious sedation during continuous monitoring of the patient's level of consciousness and physiological / cardiorespiratory status by the radiology RN, with a total moderate sedation time of 12 minutes. Under real-time ultrasound guidance, gallbladder was accessed using a transhepatic approach with a 21-gauge needle. Ultrasound image documentation was saved. Bile returned through the hub. Needle was exchanged over a 018 guidewire for transitional dilator which allowed placement of 035 J wire. Over this, a 10.2 French pigtail catheter was advanced and formed centrally in the gallbladder lumen. 20 mL bilious aspirate were removed, sent for Gram stain and culture. Small contrast injection confirmed appropriate position. Catheter secured externally with 0 Prolene suture and placed external drain bag. Patient tolerated the procedure well. COMPLICATIONS: COMPLICATIONS none IMPRESSION: 1. Technically successful percutaneous cholecystostomy tube placement with ultrasound and fluoroscopic guidance.  Electronically Signed   By: Lucrezia Europe M.D.   On: 06/27/2018 16:07      Kalman Drape , [facility] Surgery 06/29/2018, 10:08 AM  Pager: [phone] Mon-Wed, Friday 7:00am-4:30pm Thurs 7am-11:30am  Consults: [phone]

## 2018-06-29 NOTE — Progress Notes (Signed)
Pharmacy Antibiotic Note  Dean Stanley is a 84 y.o. male admitted on 06/25/2018 with intra-abdominal infection.  Pharmacy has been consulted for cefepime dosing.  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 Pt admitted with abdominal pain and acute cholecystitis.  Significant Events: -s/p perc chole drain 9/30  Today, 06/29/18  WBC 7.4  Afebrile  Pt has been on piperacillin/tazobactam since 9/28 - being converted to cefepime based on most recent culture results.   Plan:  Cefepime 2 g IV q8h  Monitor renal function, clinical course, and cultures  Weight: 160 lb (72.6 kg)  Temp (24hrs), Avg:98.4 F (36.9 C), Min:98.1 F (36.7 C), Max:98.8 F (37.1 C)  Recent Labs  Lab 06/25/18 1058 06/25/18 1104 06/25/18 1520 06/26/18 0351 06/27/18 0356 06/28/18 0455 06/29/18 0338  WBC 2.3*  --   --  1.9* 1.9* 5.3 7.4  CREATININE 0.70  --   --  0.72 0.76 0.79 0.83  LATICACIDVEN  --  1.44 0.46*  --   --   --   --     Estimated Creatinine Clearance: 63 mL/min (by C-G formula based on SCr of 0.83 mg/dL).    Allergies  Allergen Reactions  . Percocet [Oxycodone-Acetaminophen] Nausea And Vomiting  . Hydrocodone Nausea And Vomiting  . Oxycodone Nausea And Vomiting    Antimicrobials this admission: Piperacillin/tazobactam 9/28 >> 10/2 cefepime 10/2 >>   Dose adjustments this admission:  Microbiology results: 9/28 BCx: no growth 4 days 9/30: Gallbladder/bile: enterobacter species, resistant to cefazolin, ceftaz, CTX, and pip/tazo  Thank you for allowing pharmacy to be a part of this patient's care.  Lenis Noon, PharmD 06/29/2018 12:20 PM

## 2018-06-29 NOTE — Progress Notes (Signed)
Referring Physician(s): Hoxworth,B  Supervising Physician: Jacqulynn Cadet  Patient Status:  [facility] - In-pt  Chief Complaint:  Abdominal pain  Subjective: Pt doing ok; still has some RUQ discomfort; denies N/V; just finished lunch   Allergies: Percocet [oxycodone-acetaminophen]; Hydrocodone; and Oxycodone  Medications: Prior to Admission medications   Medication Sig Start Date End Date Taking? Authorizing Provider  calcipotriene (DOVONOX) 0.005 % ointment Apply topically 2 (two) times daily.   Yes [provider]  Darbepoetin Alfa (ARANESP) 300 MCG/0.6ML SOSY injection Inject 300 mcg into the skin every 30 (thirty) days. As needed based on blood counts   Yes [provider]  desmopressin (DDAVP) 0.2 MG tablet Take 0.2 mg by mouth daily.   Yes [provider]  finasteride (PROPECIA) 1 MG tablet Take 1 mg by mouth daily with breakfast.    Yes [provider]  levothyroxine (SYNTHROID, LEVOTHROID) 175 MCG tablet Take 175 mcg by mouth at bedtime.    Yes [provider]  Lutein 40 MG CAPS Take 40 mg by mouth daily with breakfast.  10/09/15  Yes [provider]  Multiple Vitamins-Minerals (CENTRUM ADULTS PO) Take 1 tablet by mouth daily.    Yes [provider]  omeprazole (PRILOSEC) 20 MG capsule Take 20 mg by mouth daily.   Yes [provider]  traMADol (ULTRAM) 50 MG tablet Take 50 mg by mouth every 6 (six) hours as needed.   Yes [provider]  triamcinolone (KENALOG) 0.025 % cream Apply 1 application topically 2 (two) times daily as needed (for inflammation).    Yes [provider]     Vital Signs: BP (!) 92/59 (BP Location: Right Arm)   Pulse 77   Temp 98.4 F (36.9 C) (Oral)   Resp 16   Wt 160 lb (72.6 kg)   SpO2 93%   BMI 24.33 kg/m   Physical Exam GB drain intact, output 100 cc, dressing dry, site mildly tender, drain flushes without difficulty  Imaging: Nm Hepatobiliary  Liver Func  Result Date: 06/26/2018 CLINICAL DATA:  Abdominal pain with abnormal CT and ultrasound EXAM: NUCLEAR MEDICINE HEPATOBILIARY IMAGING TECHNIQUE: Sequential images of the abdomen were obtained out to 60 minutes following intravenous administration of radiopharmaceutical. 2.7 mg of intravenous morphine administered after 1 hour and nonvisualization of the gallbladder. Additional images were acquired for 30 minutes. RADIOPHARMACEUTICALS:  5.27 mCi Tc-23m  Choletec IV COMPARISON:  CT and ultrasound 06/25/2018 FINDINGS: Due to equipment malfunction, static frames from initial phase of the HIDA scan were not able to be transferred to PACs. A single static image is available which demonstrates bowel activity but no definite gallbladder activity. Following morphine augmentation, there is filling of the gallbladder. Homogeneous radiotracer activity in the liver. IMPRESSION: 1. Gallbladder visualization following morphine augmentation suggests patency of the cystic duct. Findings on recent imaging studies could be secondary to chronic cholecystitis. 2. Normal biliary to bowel transit. Electronically Signed   By: Donavan Foil M.D.   On: 06/26/2018 22:15   Ir Perc Cholecystostomy  Result Date: 06/27/2018 CLINICAL DATA:  Dilated thick-walled gallbladder, pain. Leukemia with pancytopenia. Poor surgical candidate. Percutaneous drainage requested. EXAM: PERCUTANEOUS CHOLECYSTOSTOMY TUBE PLACEMENT WITH ULTRASOUND AND FLUOROSCOPIC GUIDANCE FLUOROSCOPY TIME:  1.2 minute; 191 uGym2 DAP TECHNIQUE: The procedure, risks (including but not limited to bleeding, infection, organ damage ), benefits, and alternatives were explained to the patient. Questions regarding the procedure were encouraged and answered. The patient understands and consents to the procedure. As antibiotic prophylaxis, Zosyn was  ordered pre-procedure and administered intravenously within one hour of incision.Survey ultrasound of the abdomen was performed  and an appropriate skin entry site was identified. Skin site was marked, prepped with chlorhexidine, and draped in usual sterile fashion, and infiltrated locally with 1% lidocaine. Intravenous Fentanyl and Versed were administered as conscious sedation during continuous monitoring of the patient's level of consciousness and physiological / cardiorespiratory status by the radiology RN, with a total moderate sedation time of 12 minutes. Under real-time ultrasound guidance, gallbladder was accessed using a transhepatic approach with a 21-gauge needle. Ultrasound image documentation was saved. Bile returned through the hub. Needle was exchanged over a 018 guidewire for transitional dilator which allowed placement of 035 J wire. Over this, a 10.2 French pigtail catheter was advanced and formed centrally in the gallbladder lumen. 20 mL bilious aspirate were removed, sent for Gram stain and culture. Small contrast injection confirmed appropriate position. Catheter secured externally with 0 Prolene suture and placed external drain bag. Patient tolerated the procedure well. COMPLICATIONS: COMPLICATIONS none IMPRESSION: 1. Technically successful percutaneous cholecystostomy tube placement with ultrasound and fluoroscopic guidance. Electronically Signed   By: Lucrezia Europe M.D.   On: 06/27/2018 16:07   US Abdomen Limited Ruq  Result Date: 06/25/2018 CLINICAL DATA:  Right upper quadrant pain EXAM: ULTRASOUND ABDOMEN LIMITED RIGHT UPPER QUADRANT COMPARISON:  CT 06/25/2018 FINDINGS: Gallbladder: Small to moderate sludge within the gallbladder, punctate echodense foci within the sludge, questionable for punctate stones. Positive sonographic Murphy. Increased wall thickness at 7.2 mm with vascularity. Common bile duct: Diameter: Slightly enlarged at 7 mm Liver: Septated cyst in the liver measuring 4.8 x 4.9 x 4.4 cm. Additional smaller cyst in the posterior right hepatic lobe measuring 3 x 3.2 x 1.8 cm. Trace perihepatic fluid.  Portal vein is patent on color Doppler imaging with normal direction of blood flow towards the liver. IMPRESSION: 1. Dilated gallbladder with moderate sludge, vascular wall thickening, and positive sonographic Murphy sign, findings are consistent with an acute cholecystitis. 2. Slightly enlarged common bile duct at 7 mm, correlate with LFT with follow-up MRCP as indicated 3. Small amount of perihepatic free fluid.  Cysts within the liver Electronically Signed   By: Donavan Foil M.D.   On: 06/25/2018 15:11    Labs:  CBC: Recent Labs    06/26/18 0351 06/26/18 1529 06/27/18 0356 06/28/18 0455 06/29/18 0338  WBC 1.9*  --  1.9* 5.3 7.4  HGB 6.5* 8.1* 8.3* 8.8* 8.6*  HCT 19.8* 24.4* 24.4* 26.5* 25.6*  PLT 63*  --  68* 72* 77*    COAGS: Recent Labs    06/26/18 0351 06/27/18 0356  INR 1.49 1.52  APTT 44*  --     BMP: Recent Labs    06/26/18 0351 06/27/18 0356 06/28/18 0455 06/29/18 0338  NA 135 134* 137 139  K 3.5 3.5 3.3* 3.6  CL 108 108 110 109  CO2 23 23 21* 24  GLUCOSE 91 91 97 95  BUN 12 9 8 11   CALCIUM 7.4* 7.4* 7.6* 7.7*  CREATININE 0.72 0.76 0.79 0.83  GFRNONAA >60 >60 >60 >60  GFRAA >60 >60 >60 >60    LIVER FUNCTION TESTS: Recent Labs    01/24/18 0953 06/25/18 1058 06/26/18 0351 06/27/18 0356  BILITOT 0.8 1.2 1.0 1.2  AST 18 18 14* 17  ALT 12 15 14 15   ALKPHOS 60 58 47 47  PROT 7.6 7.4 6.1* 6.2*  ALBUMIN 3.9 3.3* 2.6* 2.6*    Assessment and Plan: Pt  with hx dilated thick walled GB, abd pain, leukemia, poor surgical candidate; s/p perc GB drain 9/30; afebrile; bile cx- few enterobacter- antbx per primary team; WBC nl; hgb 8.6, creat nl; cont drain irrigation with 5 cc sterile NS every 8 hrs as IP, once daily as OP, output recording and dressing changes every 1-2 days; needs f/u cholangiogram in 4-6 weeks   Electronically Signed: D. Rowe Robert, PA-C 06/29/2018, 1:51 PM   I spent a total of  15 minutes at the the patient's bedside AND on the  patient's hospital floor or unit, greater than 50% of which was counseling/coordinating care for gallbladder drain    Patient ID: Dean Stanley, male   DOB: [birth year], 84 y.o.   MRN: [id number]

## 2018-06-30 LAB — CULTURE, BLOOD (ROUTINE X 2)
CULTURE: NO GROWTH
Culture: NO GROWTH
Special Requests: ADEQUATE

## 2018-06-30 MED ORDER — POLYETHYLENE GLYCOL 3350 17 G PO PACK: 17.0000 g | PACK | Freq: Every day | ORAL | 0 refills | Status: AC

## 2018-06-30 MED ORDER — SENNOSIDES-DOCUSATE SODIUM 8.6-50 MG PO TABS: 2.0000 | ORAL_TABLET | Freq: Two times a day (BID) | ORAL | 0 refills | Status: AC

## 2018-06-30 MED ORDER — ONDANSETRON HCL 4 MG PO TABS: 4.0000 mg | ORAL_TABLET | Freq: Four times a day (QID) | ORAL | 0 refills | Status: DC | PRN

## 2018-06-30 MED ORDER — SULFAMETHOXAZOLE-TRIMETHOPRIM 800-160 MG PO TABS: 1.0000 | ORAL_TABLET | Freq: Two times a day (BID) | ORAL | 0 refills | Status: DC

## 2018-06-30 NOTE — Discharge Instructions (Signed)
Dean Stanley,  You were admitted with cholecystitis. You had a drain placed for treatment. You will go home on antibiotics. Please continue these antibiotics until you have completed the course. Please follow-up with your primary care physician, general surgery and interventional radiology.

## 2018-06-30 NOTE — Progress Notes (Signed)
Patient refusing PO medication, stating to wife "too weak" to take them. Donne Hazel, RN

## 2018-06-30 NOTE — Progress Notes (Signed)
Instruction provided to wife regarding emptying of drain bag and care of insertion site. Will provide further instruction at a later time when she can demonstrate back to me. Donne Hazel, RN

## 2018-06-30 NOTE — Progress Notes (Signed)
Physical Therapy Treatment/Discharge Note Patient Details Name: Dean Stanley MRN: [id number] DOB: 12-02-1930 Today's Date: 06/30/2018    History of Present Illness 84 yo male admitted with acute cholecystitis s/p drain placement 9/30. Hx of MDS, AML, anemia, hypothroidism    PT Comments    Progressing well! Mod Ind with mobility. He walked ~500 feet around unit. Recommend RW use until mobility returns to baseline. No further PT needs. Will sign off.    Follow Up Recommendations  No PT follow up     Equipment Recommendations  None recommended by PT    Recommendations for Other Services       Precautions / Restrictions Precautions Precautions: Fall Precaution Comments: R drain Restrictions Weight Bearing Restrictions: No    Mobility  Bed Mobility Overal bed mobility: Modified Independent                Transfers Overall transfer level: Modified independent                  Ambulation/Gait Ambulation/Gait assistance: Modified independent (Device/Increase time) Gait Distance (Feet): 500 Feet Assistive device: Rolling walker (2 wheeled)           Stairs             Wheelchair Mobility    Modified Rankin (Stroke Patients Only)       Balance Overall balance assessment: Mild deficits observed, not formally tested                                          Cognition Arousal/Alertness: Awake/alert Behavior During Therapy: WFL for tasks assessed/performed Overall Cognitive Status: Within Functional Limits for tasks assessed                                        Exercises      General Comments        Pertinent Vitals/Pain Pain Assessment: Faces Faces Pain Scale: Hurts little more Pain Location: R flank @ drain site Pain Descriptors / Indicators: Sore;Tender Pain Intervention(s): Monitored during session;Repositioned    Home Living                      Prior Function             PT Goals (current goals can now be found in the care plan section) Progress towards PT goals: Goals met/education completed, patient discharged from PT    Frequency    Min 3X/week      PT Plan Current plan remains appropriate    Co-evaluation              AM-PAC PT "6 Clicks" Daily Activity  Outcome Measure  Difficulty turning over in bed (including adjusting bedclothes, sheets and blankets)?: A Little Difficulty moving from lying on back to sitting on the side of the bed? : A Little Difficulty sitting down on and standing up from a chair with arms (e.g., wheelchair, bedside commode, etc,.)?: A Little Help needed moving to and from a bed to chair (including a wheelchair)?: A Little Help needed walking in hospital room?: A Little Help needed climbing 3-5 steps with a railing? : A Little 6 Click Score: 18    End of Session   Activity Tolerance: Patient tolerated treatment well Patient left: in  chair;with call bell/phone within reach         Time: 1528-1540 PT Time Calculation (min) (ACUTE ONLY): 12 min  Charges:  $Gait Training: 8-22 mins                        Weston Anna, PT Acute Rehabilitation Services Pager: [phone] Office: [phone]

## 2018-06-30 NOTE — Discharge Summary (Signed)
Physician Discharge Summary  Dean Stanley IAX:[id number] DOB: [birth year] DOA: 06/25/2018  PCP: Lorene Dy, MD  Admit date: 06/25/2018 Discharge date: 06/30/2018  Admitted From: Home Disposition: Home  Recommendations for Outpatient Follow-up:  1. Follow up with PCP in 1 week 2. Follow up with general surgery in 6 weeks 3. Follow up with interventional radiology in 4 weeks 4. Palliative and/or hospice referal 5. Please obtain CMP/CBC in one week 6. Please follow up on the following pending results: None  Home Health: None (patient declined HHPT) Equipment/Devices: None  Discharge Condition: Stable CODE STATUS: DNR Diet recommendation: Regular   Brief/Interim Summary:  Admission HPI written by Cristy Folks, MD   Chief Complaint: abdominal pain, N/V  HPI: Dean Stanley is a 84 y.o. 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  Patient reports yesterday he began to have severe left lower quadrant pain.  Pain was sharp and did not radiate.  Patient reports that the pain would wax and wane.  Patient was unable to tolerate p.o. due to decreased appetite and significant amounts of nausea.  Patient did have some diarrhea.  He did not have any emesis.  He denies any cough, congestion, rhinorrhea, fevers, dysuria, constipation.  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  He is followed at [facility] for this primarily.  ED Course: In the ED patient's vitals were unremarkable.  Labs are notable for white count of 2.3, platelets of 79, hemoglobin 7.9.  CMP was fairly unremarkable other than a mild protein gap.  CT scan showed evidence of acute cholecystitis and right upper quadrant ultrasound confirmed this as  well as a mildly dilated common bile duct.  There is no evidence of choledocholithiasis.  Patient was given IV Zosyn and blood cultures were taken.  General surgery was consulted and the recommendations are pending.   Hospital course:  Acute cholecystitis High risk for surgery. S/p drain placement per interventional radiology on 9/30. Cultures obtained and significant for Enterobacter. Patient was empirically treated with Zosyn, for which the enterobacter was resistant. He was switched to Cefepime on 10/2 while inpatient and discharged on Bactrim to complete a 10 day total course. Blood cultures no growth. Physical therapy recommended home health PT for which the patient declined.  Hypokalemia Supplementation  Hypophosphatemia Supplementation  Bilateral non-obstructing calculi Outpatient follow-up  MDS/acute leukemia Follows at [facility] offered. Wife declines per chart review, however, was interested prior to discharge. Case management was consulted to supply information. Recommend palliative care and/or hospice referral as an outpatient.  Leukopenia Given Granix infusion. Improved.  Nocturia Continue DDAVP  Hypothyroidism Continue Synthroid  Pancytopenia In setting of leukemia. Stable  Hypotension In setting of infection. Improved. Had a low reading prior to discharge which, upon review, looked erroneous. Repeat was WNL. Patient without symptoms.   Discharge Diagnoses:  Active Problems:   Meniere's syndrome   MDS (myelodysplastic syndrome) (HCC)   Hypothyroidism   Acute cholecystitis   RUQ pain    Discharge Instructions  Discharge Instructions    Call MD for:  severe uncontrolled pain   Complete by:  As directed    Call MD for:  temperature >100.4   Complete by:  As directed    Diet - low sodium heart healthy  Complete by:  As directed    Increase activity slowly   Complete by:  As directed      Allergies as of 06/30/2018       Reactions   Percocet [oxycodone-acetaminophen] Nausea And Vomiting   Hydrocodone Nausea And Vomiting   Oxycodone Nausea And Vomiting      Medication List    TAKE these medications   calcipotriene 0.005 % ointment Commonly known as:  DOVONOX Apply topically 2 (two) times daily.   CENTRUM ADULTS PO Take 1 tablet by mouth daily.   Darbepoetin Alfa 300 MCG/0.6ML Sosy injection Commonly known as:  ARANESP Inject 300 mcg into the skin every 30 (thirty) days. As needed based on blood counts   desmopressin 0.2 MG tablet Commonly known as:  DDAVP Take 0.2 mg by mouth daily.   finasteride 1 MG tablet Commonly known as:  PROPECIA Take 1 mg by mouth daily with breakfast.   levothyroxine 175 MCG tablet Commonly known as:  SYNTHROID, LEVOTHROID Take 175 mcg by mouth at bedtime.   Lutein 40 MG Caps Take 40 mg by mouth daily with breakfast.   omeprazole 20 MG capsule Commonly known as:  PRILOSEC Take 20 mg by mouth daily.   ondansetron 4 MG tablet Commonly known as:  ZOFRAN Take 1 tablet (4 mg total) by mouth every 6 (six) hours as needed for nausea.   polyethylene glycol packet Commonly known as:  MIRALAX / GLYCOLAX Take 17 g by mouth daily. Start taking on:  07/01/2018   senna-docusate 8.6-50 MG tablet Commonly known as:  Senokot-S Take 2 tablets by mouth 2 (two) times daily.   sulfamethoxazole-trimethoprim 800-160 MG tablet Commonly known as:  BACTRIM DS,SEPTRA DS Take 1 tablet by mouth 2 (two) times daily for 8 days. Start taking on:  07/01/2018   traMADol 50 MG tablet Commonly known as:  ULTRAM Take 50 mg by mouth every 6 (six) hours as needed.   triamcinolone 0.025 % cream Commonly known as:  KENALOG Apply 1 application topically 2 (two) times daily as needed (for inflammation).      Follow-up Information    Lorene Dy, MD. Schedule an appointment as soon as possible for a visit in 1 week(s).   Specialty:  Internal Medicine Contact information: [place], [place] [address] [phone]        Jacqulynn Cadet, MD. Schedule an appointment as soon as possible for a visit in 4 week(s).   Specialties:  Interventional Radiology, Radiology Why:  cholangiogram (for drain) Contact information: [address] [place] [address] [phone]        Excell Seltzer, MD. Schedule an appointment as soon as possible for a visit in 6 week(s).   Specialty:  General Surgery Contact information: [address] [place] [address] [phone]          Allergies  Allergen Reactions  . Percocet [Oxycodone-Acetaminophen] Nausea And Vomiting  . Hydrocodone Nausea And Vomiting  . Oxycodone Nausea And Vomiting    Consultations:  General surgery  Interventional radiology   Procedures/Studies: Dg Chest 2 View  Result Date: 06/02/2018 CLINICAL DATA:  Cough and congestion. EXAM: CHEST - 2 VIEW COMPARISON:  03/08/2018 and prior chest radiographs. FINDINGS: The cardiomediastinal silhouette is unremarkable. Patchy and interstitial opacities within the mid-lower lungs are unchanged. A more focal peripheral nodular opacity/scarring in the lateral mid RIGHT lung identified. These opacities are new since 01/21/2012. No pleural effusion or pneumothorax. No acute bony abnormalities.  IMPRESSION: Little significant change since 03/08/2018 with nonspecific pulmonary opacities as described. Given little interval change since 03/08/2018, consider chest CT with contrast or continued radiographic follow-up as clinically indicated. Electronically Signed   By: Margarette Canada M.D.   On: 06/02/2018 08:47   Ct Chest W Contrast  Result Date: 06/14/2018 CLINICAL DATA:  84 year old male with history of coughing congestion. Former smoker. Patient is on antibiotics. EXAM: CT CHEST WITH CONTRAST TECHNIQUE: Multidetector CT imaging of the chest was performed during intravenous contrast administration. CONTRAST:  68mL ISOVUE-300  IOPAMIDOL (ISOVUE-300) INJECTION 61% COMPARISON:  No priors. FINDINGS: Creatinine was obtained on site at [facility] at [address] Results: Creatinine 0.7 mg/dL. Cardiovascular: Heart size is normal. There is no significant pericardial fluid, thickening or pericardial calcification. Aortic atherosclerosis. No definite coronary artery calcifications. Severe calcifications of the aortic valve. Mediastinum/Nodes: No pathologically enlarged mediastinal or hilar lymph nodes. Esophagus is unremarkable in appearance. No axillary lymphadenopathy. Lungs/Pleura: Patchy areas of ground-glass attenuation and septal thickening are widespread throughout the lungs bilaterally, most confluent throughout the mid to lower lungs. In addition, there are several patchy nodular appearing areas of probable airspace consolidation scattered throughout the same portions of the lungs, largest of which is in the posterior aspect of the right upper lobe abutting the major fissure (axial image 74 of series 5) measuring 2.5 x 1.1 cm. Scattered areas of mild cylindrical bronchiectasis throughout the lungs bilaterally. No pleural effusions. Upper Abdomen: 5 cm simple cyst in the segment 8 of the liver. Other smaller low-attenuation lesions throughout the liver, several which are too small to definitively characterize, but also favored to represent tiny cysts. Spleen is incompletely imaged but appears a potentially enlarged measuring at least 13.6 cm in length. Multiple nonobstructive calculi are noted within the collecting system of the right kidney measuring up to 5 mm in the upper pole. Aortic atherosclerosis. Musculoskeletal: Chronic compression fractures of T7 and T12, most severe at T7 where there is 50% loss of anterior vertebral body height. There are no aggressive appearing lytic or blastic lesions noted in the visualized portions of the skeleton. There are no aggressive appearing lytic or blastic lesions noted in the  visualized portions of the skeleton. IMPRESSION: 1. There is a spectrum of findings in the lungs suggestive of underlying interstitial lung disease, as detailed above. Outpatient referral to Pulmonology is recommended for further evaluation in the near future. Consideration for follow-up high-resolution chest CT is suggested in the next 3-6 months to assess for temporal changes in the appearance of the lung parenchyma. 2. In addition, there are several more focal nodular appearing areas of probable airspace consolidation, which likely reflect residual infection in this patient currently on antibiotic therapy. Attention at time of repeat high-resolution chest CT is recommended to ensure the resolution of these findings. 3. There are calcifications of the aortic valve. Echocardiographic correlation for evaluation of potential valvular dysfunction may be warranted if clinically indicated. 4. Aortic atherosclerosis. 5. Numerous nonobstructive calculi in the upper pole collecting system of the right kidney measuring up to 5 mm. 6. Additional incidental findings, as above. Aortic Atherosclerosis (ICD10-I70.0). Electronically Signed   By: Vinnie Langton M.D.   On: 06/14/2018 08:57   Nm Hepatobiliary Liver Func  Result Date: 06/26/2018 CLINICAL DATA:  Abdominal pain with abnormal CT and ultrasound EXAM: NUCLEAR MEDICINE HEPATOBILIARY IMAGING TECHNIQUE: Sequential images of the abdomen were obtained out to 60 minutes following intravenous administration of radiopharmaceutical. 2.7 mg of intravenous morphine administered after 1  hour and nonvisualization of the gallbladder. Additional images were acquired for 30 minutes. RADIOPHARMACEUTICALS:  5.27 mCi Tc-64m  Choletec IV COMPARISON:  CT and ultrasound 06/25/2018 FINDINGS: Due to equipment malfunction, static frames from initial phase of the HIDA scan were not able to be transferred to PACs. A single static image is available which demonstrates bowel activity but no  definite gallbladder activity. Following morphine augmentation, there is filling of the gallbladder. Homogeneous radiotracer activity in the liver. IMPRESSION: 1. Gallbladder visualization following morphine augmentation suggests patency of the cystic duct. Findings on recent imaging studies could be secondary to chronic cholecystitis. 2. Normal biliary to bowel transit. Electronically Signed   By: Donavan Foil M.D.   On: 06/26/2018 22:15   Ct Abdomen Pelvis W Contrast  Result Date: 06/25/2018 CLINICAL DATA:  Abdominal pain. Diarrhea. Constipation. RIGHT lower quadrant pain. Dark stool. Neutropenia EXAM: CT ABDOMEN AND PELVIS WITH CONTRAST TECHNIQUE: Multidetector CT imaging of the abdomen and pelvis was performed using the standard protocol following bolus administration of intravenous contrast. CONTRAST:  152mL ISOVUE-300 IOPAMIDOL (ISOVUE-300) INJECTION 61% COMPARISON:  CT 03/11/2017 FINDINGS: Lower chest: Focus of scattered small peripheral focus of consolidation air bronchograms in the peripheral RIGHT middle lobe (image 31/6). Diffuse increase in interlobular septal thickening. Hepatobiliary: Several large hepatic cysts noted. There is new distention of the gallbladder to 5.2 cm. There is pericholecystic fluid along the distended gallbladder. No radiodense gallstones present. There is mild enhancement of the cystic duct (image 40/2). No dilatation of the common bile duct. Pancreas: Normal pancreatic duct. No pancreatic inflammation. Moderate size periampullary duodenum diverticulum noted measuring 3 cm (image 38/2). Spleen: Normal spleen Adrenals/urinary tract: Adrenal glands normal. Several nonobstructing calculi in the RIGHT kidney and LEFT kidney. Ureters bladder normal. No obstructive uropathy. Small diverticulum extending from the LEFT anterior bladder. Stomach/Bowel: Stomach, small-bowel, cecum normal. Appendix is not clearly identified; however there are no secondary signs of acute appendicitis. On  comparison exam the appendix was just beneath the gallbladder. Ascending, transverse, descending colon appear normal. There is some haziness to the mesentery. There is little intra-abdominal fat. These findings make evaluation of bowel difficult. Vascular/Lymphatic: Abdominal aorta is normal caliber with atherosclerotic calcification. There is no retroperitoneal or periportal lymphadenopathy. No pelvic lymphadenopathy. Reproductive: Prostate normal Other: No free fluid or free air Musculoskeletal: No aggressive osseous lesion. IMPRESSION: 1. Distended gallbladder with pericholecystic fluid is most consistent with acute cholecystitis (calculus or a calculus). No biliary duct dilatation. 2. Appendix is not identified. On the comparison exam, the appendix was just beneath the gallbladder therefore cannot exclude secondary gallbladder inflammation from appendicitis, although less favored. 3. Pancreas appears normal. Small periampullary duodenum diverticulum. 4. Bilateral nonobstructing renal calculi. Electronically Signed   By: Suzy Bouchard M.D.   On: 06/25/2018 13:54   Ir Perc Cholecystostomy  Result Date: 06/27/2018 CLINICAL DATA:  Dilated thick-walled gallbladder, pain. Leukemia with pancytopenia. Poor surgical candidate. Percutaneous drainage requested. EXAM: PERCUTANEOUS CHOLECYSTOSTOMY TUBE PLACEMENT WITH ULTRASOUND AND FLUOROSCOPIC GUIDANCE FLUOROSCOPY TIME:  1.2 minute; 191 uGym2 DAP TECHNIQUE: The procedure, risks (including but not limited to bleeding, infection, organ damage ), benefits, and alternatives were explained to the patient. Questions regarding the procedure were encouraged and answered. The patient understands and consents to the procedure. As antibiotic prophylaxis, Zosyn was ordered pre-procedure and administered intravenously within one hour of incision.Survey ultrasound of the abdomen was performed and an appropriate skin entry site was identified. Skin site was marked, prepped with  chlorhexidine, and draped in usual sterile fashion, and  infiltrated locally with 1% lidocaine. Intravenous Fentanyl and Versed were administered as conscious sedation during continuous monitoring of the patient's level of consciousness and physiological / cardiorespiratory status by the radiology RN, with a total moderate sedation time of 12 minutes. Under real-time ultrasound guidance, gallbladder was accessed using a transhepatic approach with a 21-gauge needle. Ultrasound image documentation was saved. Bile returned through the hub. Needle was exchanged over a 018 guidewire for transitional dilator which allowed placement of 035 J wire. Over this, a 10.2 French pigtail catheter was advanced and formed centrally in the gallbladder lumen. 20 mL bilious aspirate were removed, sent for Gram stain and culture. Small contrast injection confirmed appropriate position. Catheter secured externally with 0 Prolene suture and placed external drain bag. Patient tolerated the procedure well. COMPLICATIONS: COMPLICATIONS none IMPRESSION: 1. Technically successful percutaneous cholecystostomy tube placement with ultrasound and fluoroscopic guidance. Electronically Signed   By: Lucrezia Europe M.D.   On: 06/27/2018 16:07   US Abdomen Limited Ruq  Result Date: 06/25/2018 CLINICAL DATA:  Right upper quadrant pain EXAM: ULTRASOUND ABDOMEN LIMITED RIGHT UPPER QUADRANT COMPARISON:  CT 06/25/2018 FINDINGS: Gallbladder: Small to moderate sludge within the gallbladder, punctate echodense foci within the sludge, questionable for punctate stones. Positive sonographic Murphy. Increased wall thickness at 7.2 mm with vascularity. Common bile duct: Diameter: Slightly enlarged at 7 mm Liver: Septated cyst in the liver measuring 4.8 x 4.9 x 4.4 cm. Additional smaller cyst in the posterior right hepatic lobe measuring 3 x 3.2 x 1.8 cm. Trace perihepatic fluid. Portal vein is patent on color Doppler imaging with normal direction of blood flow  towards the liver. IMPRESSION: 1. Dilated gallbladder with moderate sludge, vascular wall thickening, and positive sonographic Murphy sign, findings are consistent with an acute cholecystitis. 2. Slightly enlarged common bile duct at 7 mm, correlate with LFT with follow-up MRCP as indicated 3. Small amount of perihepatic free fluid.  Cysts within the liver Electronically Signed   By: Donavan Foil M.D.   On: 06/25/2018 15:11      Subjective: Had an episode of emesis today after taking a multivitamin. Has been well since. Got up and walked and has been in the chair a couple of times today.  Discharge Exam: Vitals:   06/30/18 1308 06/30/18 1712  BP: (!) 85/72 111/63  Pulse: 84   Resp: 16   Temp: 98.2 F (36.8 C)   SpO2: 99%    Vitals:   06/29/18 2055 06/30/18 0503 06/30/18 1308 06/30/18 1712  BP: (!) 88/58 (!) 118/57 (!) 85/72 111/63  Pulse: 81 73 84   Resp: 16 16 16    Temp: 97.8 F (36.6 C) 98.3 F (36.8 C) 98.2 F (36.8 C)   TempSrc: Oral Oral Oral   SpO2: 99% 96% 99%   Weight:        General: Pt is alert, awake, not in acute distress Cardiovascular: RRR, S1/S2 +, no rubs, no gallops Respiratory: CTA bilaterally, no wheezing, no rhonchi Abdominal: Soft, mild tenderness in RUQ, ND, bowel sounds + Extremities: no edema, no cyanosis    The results of significant diagnostics from this hospitalization (including imaging, microbiology, ancillary and laboratory) are listed below for reference.     Microbiology: Recent Results (from the past 240 hour(s))  Blood culture (routine x 2)     Status: None   Collection Time: 06/25/18  4:10 PM  Result Value Ref Range Status   Specimen Description   Final    BLOOD BLOOD LEFT FOREARM Performed at  [facility], [place] [address], [place], [place] [address]    Special Requests   Final    BOTTLES DRAWN AEROBIC AND ANAEROBIC Blood Culture results may not be optimal due to an excessive volume of blood received in culture  bottles Performed at [facility] [address], [place], [place] [address]    Culture   Final    NO GROWTH 5 DAYS Performed at [facility], [place] [address], [place], [place] [address]    Report Status 06/30/2018 FINAL  Final  Blood culture (routine x 2)     Status: None   Collection Time: 06/25/18  4:12 PM  Result Value Ref Range Status   Specimen Description   Final    BLOOD RIGHT ANTECUBITAL Performed at [facility] [address], [place], [place] [address]    Special Requests   Final    BOTTLES DRAWN AEROBIC AND ANAEROBIC Blood Culture adequate volume Performed at [facility] [address], [place], [place] [address]    Culture   Final    NO GROWTH 5 DAYS Performed at [facility], [place] [address], [place], [place] [address]    Report Status 06/30/2018 FINAL  Final  Aerobic/Anaerobic Culture (surgical/deep wound)     Status: None (Preliminary result)   Collection Time: 06/27/18  4:07 PM  Result Value Ref Range Status   Specimen Description   Final    BILE GALLBLADDER Performed at [facility] [facility] [address], [place], [place] [address]    Special Requests   Final    Immunocompromised Performed at [facility], [place] [address] [place], [place] [address]    Gram Stain   Final    RARE WBC PRESENT, PREDOMINANTLY MONONUCLEAR FEW GRAM NEGATIVE RODS Performed at [facility], [place] [address], [place], [place] [address]    Culture   Final    FEW ENTEROBACTER SPECIES NO ANAEROBES ISOLATED; CULTURE IN PROGRESS FOR 5 DAYS    Report Status PENDING  Incomplete   Organism ID, Bacteria ENTEROBACTER SPECIES  Final      Susceptibility   Enterobacter species - MIC*    CEFAZOLIN >=64 RESISTANT Resistant     CEFEPIME 2 SENSITIVE Sensitive     CEFTAZIDIME >=64 RESISTANT Resistant     CEFTRIAXONE >=64 RESISTANT Resistant     CIPROFLOXACIN <=0.25 SENSITIVE Sensitive      GENTAMICIN <=1 SENSITIVE Sensitive     IMIPENEM 0.5 SENSITIVE Sensitive     TRIMETH/SULFA <=20 SENSITIVE Sensitive     PIP/TAZO >=128 RESISTANT Resistant     * FEW ENTEROBACTER SPECIES     Labs: BNP (last 3 results) No results for input(s): BNP in the last 8760 hours. Basic Metabolic Panel: Recent Labs  Lab 06/25/18 1058 06/26/18 0351 06/27/18 0356 06/28/18 0455 06/29/18 0338  NA 138 135 134* 137 139  K 3.7 3.5 3.5 3.3* 3.6  CL 109 108 108 110 109  CO2 23 23 23  21* 24  GLUCOSE 121* 91 91 97 95  BUN 14 12 9 8 11   CREATININE 0.70 0.72 0.76 0.79 0.83  CALCIUM 8.1* 7.4* 7.4* 7.6* 7.7*  MG  --   --   --  1.9  --   PHOS  --   --   --  2.0*  --    Liver Function Tests: Recent Labs  Lab 06/25/18 1058 06/26/18 0351 06/27/18 0356  AST 18 14* 17  ALT 15 14 15   ALKPHOS 58 47 47  BILITOT 1.2 1.0 1.2  PROT 7.4 6.1* 6.2*  ALBUMIN 3.3* 2.6* 2.6*   Recent Labs  Lab 06/25/18 1058  LIPASE 39   No results for input(s): AMMONIA in the last 168 hours. CBC: Recent Labs  Lab 06/25/18 1058 06/26/18 0351 06/26/18 1529 06/27/18 0356 06/28/18 0455 06/29/18 0338  WBC 2.3* 1.9*  --  1.9* 5.3 7.4  NEUTROABS 1.2*  --   --   --   --  5.2  HGB 7.9* 6.5* 8.1* 8.3* 8.8* 8.6*  HCT 24.2* 19.8* 24.4* 24.4* 26.5* 25.6*  MCV 89.0 90.0  --  89.4 89.5 89.8  PLT 79* 63*  --  68* 72* 77*   Cardiac Enzymes: No results for input(s): CKTOTAL, CKMB, CKMBINDEX, TROPONINI in the last 168 hours. BNP: Invalid input(s): POCBNP CBG: No results for input(s): GLUCAP in the last 168 hours. D-Dimer No results for input(s): DDIMER in the last 72 hours. Hgb A1c No results for input(s): HGBA1C in the last 72 hours. Lipid Profile No results for input(s): CHOL, HDL, LDLCALC, TRIG, CHOLHDL, LDLDIRECT in the last 72 hours. Thyroid function studies No results for input(s): TSH, T4TOTAL, T3FREE, THYROIDAB in the last 72 hours.  Invalid input(s): FREET3 Anemia work up No results for input(s):  VITAMINB12, FOLATE, FERRITIN, TIBC, IRON, RETICCTPCT in the last 72 hours. Urinalysis    Component Value Date/Time   COLORURINE AMBER (A) 06/25/2018 1023   APPEARANCEUR CLEAR 06/25/2018 1023   LABSPEC 1.021 06/25/2018 1023   PHURINE 6.0 06/25/2018 1023   GLUCOSEU NEGATIVE 06/25/2018 1023   HGBUR NEGATIVE 06/25/2018 [place] 06/25/2018 1023   [place] 06/25/2018 1023   PROTEINUR NEGATIVE 06/25/2018 1023   NITRITE NEGATIVE 06/25/2018 1023   LEUKOCYTESUR NEGATIVE 06/25/2018 1023   Sepsis Labs Invalid input(s): PROCALCITONIN,  WBC,  LACTICIDVEN Microbiology Recent Results (from the past 240 hour(s))  Blood culture (routine x 2)     Status: None   Collection Time: 06/25/18  4:10 PM  Result Value Ref Range Status   Specimen Description   Final    BLOOD BLOOD LEFT FOREARM Performed at [facility], [place] [address], [place], [place] [address]    Special Requests   Final    BOTTLES DRAWN AEROBIC AND ANAEROBIC Blood Culture results may not be optimal due to an excessive volume of blood received in culture bottles Performed at [facility] [address] [place], [place] [address]    Culture   Final    NO GROWTH 5 DAYS Performed at [facility], [place] [address] [place], [place] [address]    Report Status 06/30/2018 FINAL  Final  Blood culture (routine x 2)     Status: None   Collection Time: 06/25/18  4:12 PM  Result Value Ref Range Status   Specimen Description   Final    BLOOD RIGHT ANTECUBITAL Performed at [facility] [address] [place], [place] [address]    Special Requests   Final    BOTTLES DRAWN AEROBIC AND ANAEROBIC Blood Culture adequate volume Performed at [facility] [address], [place], [place] [address]    Culture   Final    NO GROWTH 5 DAYS Performed at [facility], [place] [address] [place], [place] [address]    Report Status  06/30/2018 FINAL  Final  Aerobic/Anaerobic Culture (surgical/deep wound)     Status: None (Preliminary  result)   Collection Time: 06/27/18  4:07 PM  Result Value Ref Range Status   Specimen Description   Final    BILE GALLBLADDER Performed at [facility] [address] [place], [place] [address]    Special Requests   Final    Immunocompromised Performed at [facility], [place] [address], [place], [place] [address]    Gram Stain   Final    RARE WBC PRESENT, PREDOMINANTLY MONONUCLEAR FEW GRAM NEGATIVE RODS Performed at [facility], [place] [address] [place], [place] [address]    Culture   Final    FEW ENTEROBACTER SPECIES NO ANAEROBES ISOLATED; CULTURE IN PROGRESS FOR 5 DAYS    Report Status PENDING  Incomplete   Organism ID, Bacteria ENTEROBACTER SPECIES  Final      Susceptibility   Enterobacter species - MIC*    CEFAZOLIN >=64 RESISTANT Resistant     CEFEPIME 2 SENSITIVE Sensitive     CEFTAZIDIME >=64 RESISTANT Resistant     CEFTRIAXONE >=64 RESISTANT Resistant     CIPROFLOXACIN <=0.25 SENSITIVE Sensitive     GENTAMICIN <=1 SENSITIVE Sensitive     IMIPENEM 0.5 SENSITIVE Sensitive     TRIMETH/SULFA <=20 SENSITIVE Sensitive     PIP/TAZO >=128 RESISTANT Resistant     * FEW ENTEROBACTER SPECIES    SIGNED:   Cordelia Poche, MD [facility] 06/30/2018, 5:44 PM

## 2018-06-30 NOTE — Progress Notes (Signed)
Patient became nauseated after taking multivitamin; wife requests we not give multivitamin, as she believes taking it "on an empty stomach made him sick". Patient refuses Zofran. Donne Hazel, RN

## 2018-06-30 NOTE — Final Consult Note (Signed)
Consultant Final Sign-Off Note    Assessment/Final recommendations  Dean Stanley is a 84 y.o. male followed by me for cholecystitis S/P perc chole drain 09/30 by IR.   Wound care (if applicable):    Diet at discharge: regular diet is okay   Activity at discharge: per primary team   Follow-up appointment:  6 weeks with Dr. Excell Seltzer   Pending results:  Unresulted Labs (From admission, onward)   None       Medication recommendations: 10 days total of antibiotics. Can transition to PO at discharge.    Other recommendations:    Thank you for allowing [place] to participate in the care of your patient!  Please consult [place] again if you have further needs for your patient.  Dean Stanley 06/30/2018 9:09 AM    Subjective   CC: abdominal pain  Pt is feeling better and pain is improved. No issues overnight. He is tolerating his diet and had a small BM overnight. Wife at bedside.   Objective  Vital signs in last 24 hours: Temp:  [97.8 F (36.6 C)-98.5 F (36.9 C)] 98.3 F (36.8 C) (10/03 0503) Pulse Rate:  [73-81] 73 (10/03 0503) Resp:  [16] 16 (10/03 0503) BP: (88-118)/(54-58) 118/57 (10/03 0503) SpO2:  [96 %-99 %] 96 % (10/03 0503) Weight:  [72.6 kg] 72.6 kg (10/02 1150)  PE: Gen: Alert, NAD, pleasant, cooperative Pulm:Rate andeffort normal Abd: Soft,not distended,+BS, RUQ drain with dark green liquid in bag, improved TTP of right hemiabdomen, no guarding, no peritonitis Skin: no rashes noted, warm and dry   Pertinent labs and Studies: Recent Labs    06/28/18 0455 06/29/18 0338  WBC 5.3 7.4  HGB 8.8* 8.6*  HCT 26.5* 25.6*   BMET Recent Labs    06/28/18 0455 06/29/18 0338  NA 137 139  K 3.3* 3.6  CL 110 109  CO2 21* 24  GLUCOSE 97 95  BUN 8 11  CREATININE 0.79 0.83  CALCIUM 7.6* 7.7*   No results for input(s): LABURIN in the last 72 hours. Results for orders placed or performed during the hospital encounter of 06/25/18  Blood culture  (routine x 2)     Status: None   Collection Time: 06/25/18  4:10 PM  Result Value Ref Range Status   Specimen Description   Final    BLOOD BLOOD LEFT FOREARM Performed at [facility] [address], [place], [place] [address]    Special Requests   Final    BOTTLES DRAWN AEROBIC AND ANAEROBIC Blood Culture results may not be optimal due to an excessive volume of blood received in culture bottles Performed at [facility] [address], [place], [place] [address]    Culture   Final    NO GROWTH 5 DAYS Performed at [facility], [place] [address], [place], [place] [address]    Report Status 06/30/2018 FINAL  Final  Blood culture (routine x 2)     Status: None   Collection Time: 06/25/18  4:12 PM  Result Value Ref Range Status   Specimen Description   Final    BLOOD RIGHT ANTECUBITAL Performed at [facility] [address], [place], [place] [address]    Special Requests   Final    BOTTLES DRAWN AEROBIC AND ANAEROBIC Blood Culture adequate volume Performed at [facility] [address], [place],  [address]    Culture   Final    NO GROWTH 5 DAYS  Performed at [facility], [place] [address], [place], [place] [address]    Report Status 06/30/2018 FINAL  Final  Aerobic/Anaerobic Culture (surgical/deep wound)     Status: None (Preliminary result)   Collection Time: 06/27/18  4:07 PM  Result Value Ref Range Status   Specimen Description   Final    BILE GALLBLADDER Performed at [facility] [address] [place] [address] [place], [place] [address]    Special Requests   Final    Immunocompromised Performed at [facility], [place] [address] [place], [place] [address]    Gram Stain   Final    RARE WBC PRESENT, PREDOMINANTLY MONONUCLEAR FEW GRAM NEGATIVE RODS Performed at [facility], [place] [address], [place], [place] [address]    Culture   Final    FEW  ENTEROBACTER SPECIES NO ANAEROBES ISOLATED; CULTURE IN PROGRESS FOR 5 DAYS    Report Status PENDING  Incomplete   Organism ID, Bacteria ENTEROBACTER SPECIES  Final      Susceptibility   Enterobacter species - MIC*    CEFAZOLIN >=64 RESISTANT Resistant     CEFEPIME 2 SENSITIVE Sensitive     CEFTAZIDIME >=64 RESISTANT Resistant     CEFTRIAXONE >=64 RESISTANT Resistant     CIPROFLOXACIN <=0.25 SENSITIVE Sensitive     GENTAMICIN <=1 SENSITIVE Sensitive     IMIPENEM 0.5 SENSITIVE Sensitive     TRIMETH/SULFA <=20 SENSITIVE Sensitive     PIP/TAZO >=128 RESISTANT Resistant     * FEW ENTEROBACTER SPECIES    Imaging: No results found.

## 2018-06-30 NOTE — Care Management Note (Signed)
Case Management Note  Patient Details  Name: Dean Stanley MRN: [id number] Date of Birth: 16-Dec-1930  Subjective/Objective:   Spoke with patient and spouse at bedside. They had questions regarding hospice care, I provided them with a general overview but the wife was looking more for custodial care but was thankful for information.             Action/Plan: Provided them with resources for [facility], and encouraged them to discuss with their oncologist regarding hospice.   Expected Discharge Date:                  Expected Discharge Plan:  Home/Self Care  In-House Referral:  NA  Discharge planning Services  CM Consult  Post Acute Care Choice:  NA Choice offered to:  Patient, Spouse  DME Arranged:  N/A DME Agency:  NA  HH Arranged:  NA HH Agency:  NA  Status of Service:  Completed, signed off  If discussed at [place] of Stay Meetings, dates discussed:    Additional Comments:  Guadalupe Maple, RN 06/30/2018, 1:27 PM

## 2018-06-30 NOTE — Progress Notes (Addendum)
Referring Physician(s): Hoxworth, Marland Kitchen  Supervising Physician: Jacqulynn Cadet  Patient Status:  [facility] - In-pt  Chief Complaint: None  Subjective:  Acute cholecystitis s/p percutaneous cholecystostomy placement 06/27/2018 with Dr. Vernard Gambles. Patient awake and alert sitting in bed with no complaints at this time. Percutaneous cholecystostomy site c/d/i with minimal serous output in gravity bag.   Allergies: Percocet [oxycodone-acetaminophen]; Hydrocodone; and Oxycodone  Medications: Prior to Admission medications   Medication Sig Start Date End Date Taking? Authorizing Provider  calcipotriene (DOVONOX) 0.005 % ointment Apply topically 2 (two) times daily.   Yes [provider]  Darbepoetin Alfa (ARANESP) 300 MCG/0.6ML SOSY injection Inject 300 mcg into the skin every 30 (thirty) days. As needed based on blood counts   Yes [provider]  desmopressin (DDAVP) 0.2 MG tablet Take 0.2 mg by mouth daily.   Yes [provider]  finasteride (PROPECIA) 1 MG tablet Take 1 mg by mouth daily with breakfast.    Yes [provider]  levothyroxine (SYNTHROID, LEVOTHROID) 175 MCG tablet Take 175 mcg by mouth at bedtime.    Yes [provider]  Lutein 40 MG CAPS Take 40 mg by mouth daily with breakfast.  10/09/15  Yes [provider]  Multiple Vitamins-Minerals (CENTRUM ADULTS PO) Take 1 tablet by mouth daily.    Yes [provider]  omeprazole (PRILOSEC) 20 MG capsule Take 20 mg by mouth daily.   Yes [provider]  traMADol (ULTRAM) 50 MG tablet Take 50 mg by mouth every 6 (six) hours as needed.   Yes [provider]  triamcinolone (KENALOG) 0.025 % cream Apply 1 application topically 2 (two) times daily as needed (for inflammation).    Yes [provider]     Vital Signs: BP (!) 85/72 (BP Location: Right Arm)   Pulse 84   Temp 98.2 F (36.8 C) (Oral)   Resp 16   Wt 160 lb (72.6 kg)   SpO2 99%    BMI 24.33 kg/m   Physical Exam  Constitutional: He is oriented to person, place, and time. He appears well-developed and well-nourished. No distress.  Pulmonary/Chest: Effort normal. No respiratory distress.  Abdominal: Soft. There is no tenderness.  Percutaneous gastrostomy site without erythema, drainage, or tenderness; minimal serous fluid in gravity bag; drain flushes/aspirates without resistance.  Neurological: He is alert and oriented to person, place, and time.  Skin: Skin is warm and dry.  Psychiatric: He has a normal mood and affect. His behavior is normal. Judgment and thought content normal.  Nursing note and vitals reviewed.   Imaging: Nm Hepatobiliary Liver Func  Result Date: 06/26/2018 CLINICAL DATA:  Abdominal pain with abnormal CT and ultrasound EXAM: NUCLEAR MEDICINE HEPATOBILIARY IMAGING TECHNIQUE: Sequential images of the abdomen were obtained out to 60 minutes following intravenous administration of radiopharmaceutical. 2.7 mg of intravenous morphine administered after 1 hour and nonvisualization of the gallbladder. Additional images were acquired for 30 minutes. RADIOPHARMACEUTICALS:  5.27 mCi Tc-65m  Choletec IV COMPARISON:  CT and ultrasound 06/25/2018 FINDINGS: Due to equipment malfunction, static frames from initial phase of the HIDA scan were not able to be transferred to PACs. A single static image is available which demonstrates bowel activity but no definite gallbladder activity. Following morphine augmentation, there is filling of the gallbladder. Homogeneous radiotracer activity in the liver. IMPRESSION: 1. Gallbladder visualization following morphine augmentation suggests patency of the cystic duct. Findings on recent imaging studies could be secondary to chronic cholecystitis. 2. Normal biliary to bowel transit.  Electronically Signed   By: Donavan Foil M.D.   On: 06/26/2018 22:15   Ir Perc Cholecystostomy  Result Date: 06/27/2018 CLINICAL DATA:  Dilated  thick-walled gallbladder, pain. Leukemia with pancytopenia. Poor surgical candidate. Percutaneous drainage requested. EXAM: PERCUTANEOUS CHOLECYSTOSTOMY TUBE PLACEMENT WITH ULTRASOUND AND FLUOROSCOPIC GUIDANCE FLUOROSCOPY TIME:  1.2 minute; 191 uGym2 DAP TECHNIQUE: The procedure, risks (including but not limited to bleeding, infection, organ damage ), benefits, and alternatives were explained to the patient. Questions regarding the procedure were encouraged and answered. The patient understands and consents to the procedure. As antibiotic prophylaxis, Zosyn was ordered pre-procedure and administered intravenously within one hour of incision.Survey ultrasound of the abdomen was performed and an appropriate skin entry site was identified. Skin site was marked, prepped with chlorhexidine, and draped in usual sterile fashion, and infiltrated locally with 1% lidocaine. Intravenous Fentanyl and Versed were administered as conscious sedation during continuous monitoring of the patient's level of consciousness and physiological / cardiorespiratory status by the radiology RN, with a total moderate sedation time of 12 minutes. Under real-time ultrasound guidance, gallbladder was accessed using a transhepatic approach with a 21-gauge needle. Ultrasound image documentation was saved. Bile returned through the hub. Needle was exchanged over a 018 guidewire for transitional dilator which allowed placement of 035 J wire. Over this, a 10.2 French pigtail catheter was advanced and formed centrally in the gallbladder lumen. 20 mL bilious aspirate were removed, sent for Gram stain and culture. Small contrast injection confirmed appropriate position. Catheter secured externally with 0 Prolene suture and placed external drain bag. Patient tolerated the procedure well. COMPLICATIONS: COMPLICATIONS none IMPRESSION: 1. Technically successful percutaneous cholecystostomy tube placement with ultrasound and fluoroscopic guidance.  Electronically Signed   By: Lucrezia Europe M.D.   On: 06/27/2018 16:07    Labs:  CBC: Recent Labs    06/26/18 0351 06/26/18 1529 06/27/18 0356 06/28/18 0455 06/29/18 0338  WBC 1.9*  --  1.9* 5.3 7.4  HGB 6.5* 8.1* 8.3* 8.8* 8.6*  HCT 19.8* 24.4* 24.4* 26.5* 25.6*  PLT 63*  --  68* 72* 77*    COAGS: Recent Labs    06/26/18 0351 06/27/18 0356  INR 1.49 1.52  APTT 44*  --     BMP: Recent Labs    06/26/18 0351 06/27/18 0356 06/28/18 0455 06/29/18 0338  NA 135 134* 137 139  K 3.5 3.5 3.3* 3.6  CL 108 108 110 109  CO2 23 23 21* 24  GLUCOSE 91 91 97 95  BUN 12 9 8 11   CALCIUM 7.4* 7.4* 7.6* 7.7*  CREATININE 0.72 0.76 0.79 0.83  GFRNONAA >60 >60 >60 >60  GFRAA >60 >60 >60 >60    LIVER FUNCTION TESTS: Recent Labs    01/24/18 0953 06/25/18 1058 06/26/18 0351 06/27/18 0356  BILITOT 0.8 1.2 1.0 1.2  AST 18 18 14* 17  ALT 12 15 14 15   ALKPHOS 60 58 47 47  PROT 7.6 7.4 6.1* 6.2*  ALBUMIN 3.9 3.3* 2.6* 2.6*    Assessment and Plan:  Acute cholecystitis s/p percutaneous cholecystostomy placement 06/27/2018 with Dr. Vernard Gambles. Percutaneous cholecystostomy drain stable. WBCs stable, denies fever. Continue with Qshift flushes and monitoring of output for now, begin Qdaily flushes and dressing changes every 1-2 days once outpatient. Will need F/U cholangiogram at outpatient clinic in 4-6 weeks after discharge.   Electronically Signed: Earley Abide, PA-C 06/30/2018, 2:15 PM   I spent a total of 15 Minutes at the the patient's bedside AND on the patient's hospital floor  or unit, greater than 50% of which was counseling/coordinating care for acute cholecystitis s/p percutaneous cholecystostomy placement.

## 2018-06-30 NOTE — Progress Notes (Signed)
PT Cancellation Note  Patient Details Name: Kadien Lineman MRN: [id number] DOB: [birth year]   Cancelled Treatment:    Reason Eval/Treat Not Completed: Attempted PT tx session-pt declined due to nausea, fatigue. Will check back as schedule allows.    Weston Anna, PT Acute Rehabilitation Services Pager: [phone] Office: [phone]

## 2018-07-02 LAB — AEROBIC/ANAEROBIC CULTURE (SURGICAL/DEEP WOUND)

## 2018-07-02 LAB — AEROBIC/ANAEROBIC CULTURE W GRAM STAIN (SURGICAL/DEEP WOUND)

## 2018-07-04 ENCOUNTER — Other Ambulatory Visit: Payer: Medicare Other

## 2018-07-04 ENCOUNTER — Other Ambulatory Visit: Payer: Self-pay | Admitting: General Surgery

## 2018-07-04 DIAGNOSIS — K810 Acute cholecystitis: Secondary | ICD-10-CM

## 2018-07-06 ENCOUNTER — Inpatient Hospital Stay: Payer: Medicare Other

## 2018-07-06 ENCOUNTER — Inpatient Hospital Stay: Payer: Medicare Other | Attending: Internal Medicine

## 2018-07-06 DIAGNOSIS — D4620 Refractory anemia with excess of blasts, unspecified: Secondary | ICD-10-CM | POA: Diagnosis present

## 2018-07-06 DIAGNOSIS — D469 Myelodysplastic syndrome, unspecified: Secondary | ICD-10-CM

## 2018-07-06 LAB — CBC WITH DIFFERENTIAL (CANCER CENTER ONLY)
BASOS ABS: 0 10*3/uL (ref 0.0–0.1)
BASOS PCT: 0 %
Band Neutrophils: 3 %
Blasts: 7 %
EOS ABS: 0 10*3/uL (ref 0.0–0.5)
EOS PCT: 1 %
HEMATOCRIT: 25.1 % — AB (ref 39.0–52.0)
Hemoglobin: 8.3 g/dL — ABNORMAL LOW (ref 13.0–17.0)
Lymphocytes Relative: 47 %
Lymphs Abs: 1.2 10*3/uL (ref 0.7–4.0)
MCH: 29.6 pg (ref 26.0–34.0)
MCHC: 33.1 g/dL (ref 30.0–36.0)
MCV: 89.6 fL (ref 80.0–100.0)
MONO ABS: 0.3 10*3/uL (ref 0.1–1.0)
Metamyelocytes Relative: 1 %
Monocytes Relative: 13 %
Myelocytes: 3 %
NEUTROS PCT: 7 %
NRBC: 0 % (ref 0.0–0.2)
Neutro Abs: 0.4 10*3/uL — CL (ref 1.7–17.7)
Other: 18 %
PLATELETS: 48 10*3/uL — AB (ref 150–400)
RBC: 2.8 MIL/uL — AB (ref 4.22–5.81)
RDW: 16.7 % — AB (ref 11.5–15.5)
WBC: 2.5 10*3/uL — AB (ref 4.0–10.5)

## 2018-07-06 LAB — SAMPLE TO BLOOD BANK

## 2018-07-09 ENCOUNTER — Other Ambulatory Visit: Payer: Self-pay

## 2018-07-09 ENCOUNTER — Encounter (HOSPITAL_COMMUNITY): Payer: Self-pay

## 2018-07-09 ENCOUNTER — Inpatient Hospital Stay (HOSPITAL_COMMUNITY)
Admission: EM | Admit: 2018-07-09 | Discharge: 2018-07-12 | DRG: 392 | Disposition: A | Discharge status: Home / Self Care | Payer: Medicare Other | Attending: Family Medicine | Admitting: Family Medicine

## 2018-07-09 ENCOUNTER — Emergency Department (HOSPITAL_COMMUNITY): Payer: Medicare Other

## 2018-07-09 DIAGNOSIS — D649 Anemia, unspecified: Secondary | ICD-10-CM | POA: Diagnosis present

## 2018-07-09 DIAGNOSIS — R351 Nocturia: Secondary | ICD-10-CM | POA: Diagnosis present

## 2018-07-09 DIAGNOSIS — R748 Abnormal levels of other serum enzymes: Secondary | ICD-10-CM | POA: Diagnosis present

## 2018-07-09 DIAGNOSIS — K5900 Constipation, unspecified: Secondary | ICD-10-CM | POA: Diagnosis not present

## 2018-07-09 DIAGNOSIS — E039 Hypothyroidism, unspecified: Secondary | ICD-10-CM | POA: Diagnosis present

## 2018-07-09 DIAGNOSIS — K819 Cholecystitis, unspecified: Secondary | ICD-10-CM | POA: Diagnosis present

## 2018-07-09 DIAGNOSIS — I959 Hypotension, unspecified: Secondary | ICD-10-CM | POA: Diagnosis present

## 2018-07-09 DIAGNOSIS — N401 Enlarged prostate with lower urinary tract symptoms: Secondary | ICD-10-CM | POA: Diagnosis present

## 2018-07-09 DIAGNOSIS — C9500 Acute leukemia of unspecified cell type not having achieved remission: Secondary | ICD-10-CM | POA: Diagnosis not present

## 2018-07-09 DIAGNOSIS — Z800 Family history of malignant neoplasm of digestive organs: Secondary | ICD-10-CM

## 2018-07-09 DIAGNOSIS — R338 Other retention of urine: Secondary | ICD-10-CM | POA: Diagnosis present

## 2018-07-09 DIAGNOSIS — D469 Myelodysplastic syndrome, unspecified: Secondary | ICD-10-CM | POA: Diagnosis present

## 2018-07-09 DIAGNOSIS — E861 Hypovolemia: Secondary | ICD-10-CM | POA: Diagnosis present

## 2018-07-09 DIAGNOSIS — R109 Unspecified abdominal pain: Secondary | ICD-10-CM | POA: Diagnosis present

## 2018-07-09 DIAGNOSIS — D61818 Other pancytopenia: Secondary | ICD-10-CM | POA: Diagnosis present

## 2018-07-09 DIAGNOSIS — Z808 Family history of malignant neoplasm of other organs or systems: Secondary | ICD-10-CM

## 2018-07-09 DIAGNOSIS — E871 Hypo-osmolality and hyponatremia: Secondary | ICD-10-CM | POA: Diagnosis present

## 2018-07-09 DIAGNOSIS — Z66 Do not resuscitate: Secondary | ICD-10-CM | POA: Diagnosis present

## 2018-07-09 LAB — URINALYSIS, ROUTINE W REFLEX MICROSCOPIC
Bacteria, UA: NONE SEEN
Bilirubin Urine: NEGATIVE
Glucose, UA: NEGATIVE mg/dL
Ketones, ur: NEGATIVE mg/dL
Leukocytes, UA: NEGATIVE
Nitrite: NEGATIVE
Protein, ur: NEGATIVE mg/dL
Specific Gravity, Urine: 1.019 (ref 1.005–1.030)
pH: 6 (ref 5.0–8.0)

## 2018-07-09 LAB — COMPREHENSIVE METABOLIC PANEL
ALT: 30 U/L (ref 0–44)
AST: 23 U/L (ref 15–41)
Albumin: 3.3 g/dL — ABNORMAL LOW (ref 3.5–5.0)
Alkaline Phosphatase: 45 U/L (ref 38–126)
Anion gap: 7 (ref 5–15)
BUN: 17 mg/dL (ref 8–23)
CO2: 24 mmol/L (ref 22–32)
Calcium: 8.7 mg/dL — ABNORMAL LOW (ref 8.9–10.3)
Chloride: 99 mmol/L (ref 98–111)
Creatinine, Ser: 1.07 mg/dL (ref 0.61–1.24)
GFR calc Af Amer: >60 mL/min (ref >60)
GFR calc non Af Amer: >60 mL/min (ref >60)
Glucose, Bld: 108 mg/dL — ABNORMAL HIGH (ref 70–99)
Potassium: 4.6 mmol/L (ref 3.5–5.1)
Sodium: 130 mmol/L — ABNORMAL LOW (ref 135–145)
Total Bilirubin: 1.1 mg/dL (ref 0.3–1.2)
Total Protein: 7.1 g/dL (ref 6.5–8.1)

## 2018-07-09 LAB — CBC WITH DIFFERENTIAL/PLATELET
Band Neutrophils: 8 %
Basophils Absolute: 0.1 10*3/uL (ref 0.0–0.1)
Basophils Relative: 2 %
Eosinophils Absolute: 0 10*3/uL (ref 0.0–0.5)
Eosinophils Relative: 1 %
HCT: 20.9 % — ABNORMAL LOW (ref 39.0–52.0)
Hemoglobin: 6.6 g/dL — CL (ref 13.0–17.0)
Lymphocytes Relative: 32 %
Lymphs Abs: 1 10*3/uL (ref 0.7–4.0)
MCH: 28.9 pg (ref 26.0–34.0)
MCHC: 31.6 g/dL (ref 30.0–36.0)
MCV: 91.7 fL (ref 80.0–100.0)
Metamyelocytes Relative: 4 %
Monocytes Absolute: 0.3 10*3/uL (ref 0.1–1.0)
Monocytes Relative: 10 %
Myelocytes: 7 %
Neutro Abs: 1.5 10*3/uL — ABNORMAL LOW (ref 1.7–7.7)
Neutrophils Relative %: 28 %
Other: 6 %
Platelets: 43 10*3/uL — ABNORMAL LOW (ref 150–400)
Promyelocytes Relative: 2 %
RBC: 2.28 MIL/uL — ABNORMAL LOW (ref 4.22–5.81)
RDW: 16.2 % — ABNORMAL HIGH (ref 11.5–15.5)
WBC: 3 10*3/uL — ABNORMAL LOW (ref 4.0–10.5)
nRBC: 1 % — ABNORMAL HIGH (ref 0.0–0.2)

## 2018-07-09 LAB — PREPARE RBC (CROSSMATCH)

## 2018-07-09 LAB — LIPASE, BLOOD: Lipase: 61 U/L — ABNORMAL HIGH (ref 11–51)

## 2018-07-09 MED ORDER — PROMETHAZINE HCL 25 MG/ML IJ SOLN
12.5000 mg | Freq: Once | INTRAMUSCULAR | Status: AC
  Administered 2018-07-09: 12.5 mg via INTRAVENOUS
  Filled 2018-07-09: qty 1

## 2018-07-09 MED ORDER — HYDROMORPHONE HCL 1 MG/ML IJ SOLN
0.5000 mg | INTRAMUSCULAR | Status: DC | PRN
  Administered 2018-07-09 – 2018-07-11 (×4): 0.5 mg via INTRAVENOUS
  Filled 2018-07-09 (×4): qty 1

## 2018-07-09 MED ORDER — HYDROMORPHONE HCL 1 MG/ML IJ SOLN
0.5000 mg | Freq: Once | INTRAMUSCULAR | Status: AC
  Administered 2018-07-09: 0.5 mg via INTRAVENOUS
  Filled 2018-07-09: qty 1

## 2018-07-09 MED ORDER — DESMOPRESSIN ACETATE 0.2 MG PO TABS: 0.2000 mg | ORAL_TABLET | Freq: Every day | ORAL | Status: DC

## 2018-07-09 MED ORDER — ONDANSETRON HCL 4 MG PO TABS: 4.0000 mg | ORAL_TABLET | Freq: Four times a day (QID) | ORAL | Status: DC | PRN

## 2018-07-09 MED ORDER — SODIUM CHLORIDE 0.9 % IV SOLN
INTRAVENOUS | Status: AC
  Administered 2018-07-09: 23:00:00 via INTRAVENOUS

## 2018-07-09 MED ORDER — SODIUM CHLORIDE 0.9 % IV BOLUS
500.0000 mL | Freq: Once | INTRAVENOUS | Status: AC
  Administered 2018-07-10: 500 mL via INTRAVENOUS

## 2018-07-09 MED ORDER — ACETAMINOPHEN 325 MG PO TABS: 650.0000 mg | ORAL_TABLET | Freq: Four times a day (QID) | ORAL | Status: DC | PRN

## 2018-07-09 MED ORDER — LEVOTHYROXINE SODIUM 50 MCG PO TABS
175.0000 ug | ORAL_TABLET | Freq: Every day | ORAL | Status: DC
  Administered 2018-07-09 – 2018-07-11 (×3): 175 ug via ORAL
  Filled 2018-07-09 (×4): qty 1

## 2018-07-09 MED ORDER — ONDANSETRON HCL 4 MG/2ML IJ SOLN: 4.0000 mg | Freq: Four times a day (QID) | INTRAMUSCULAR | Status: DC | PRN

## 2018-07-09 MED ORDER — SODIUM CHLORIDE 0.9% IV SOLUTION
Freq: Once | INTRAVENOUS | Status: AC
  Administered 2018-07-09: via INTRAVENOUS

## 2018-07-09 MED ORDER — IOPAMIDOL (ISOVUE-300) INJECTION 61%
INTRAVENOUS | Status: AC
  Filled 2018-07-09: qty 100

## 2018-07-09 MED ORDER — ACETAMINOPHEN 650 MG RE SUPP: 650.0000 mg | Freq: Four times a day (QID) | RECTAL | Status: DC | PRN

## 2018-07-09 MED ORDER — TRAMADOL HCL 50 MG PO TABS
50.0000 mg | ORAL_TABLET | Freq: Four times a day (QID) | ORAL | Status: DC | PRN
  Administered 2018-07-10 – 2018-07-11 (×3): 50 mg via ORAL
  Filled 2018-07-09 (×3): qty 1

## 2018-07-09 MED ORDER — IOPAMIDOL (ISOVUE-300) INJECTION 61%
100.0000 mL | Freq: Once | INTRAVENOUS | Status: AC | PRN
  Administered 2018-07-09: 100 mL via INTRAVENOUS

## 2018-07-09 MED ORDER — SODIUM CHLORIDE 0.9 % IV SOLN
INTRAVENOUS | Status: DC
  Administered 2018-07-09: 17:00:00 via INTRAVENOUS

## 2018-07-09 MED ORDER — SENNOSIDES-DOCUSATE SODIUM 8.6-50 MG PO TABS
2.0000 | ORAL_TABLET | Freq: Two times a day (BID) | ORAL | Status: DC
  Administered 2018-07-10 – 2018-07-12 (×5): 2 via ORAL
  Filled 2018-07-09 (×6): qty 2

## 2018-07-09 MED ORDER — POLYETHYLENE GLYCOL 3350 17 G PO PACK
34.0000 g | PACK | Freq: Every day | ORAL | Status: DC
  Administered 2018-07-10 (×2): 34 g via ORAL
  Filled 2018-07-09 (×2): qty 2

## 2018-07-09 MED ORDER — FINASTERIDE 1 MG PO TABS: 1.0000 mg | ORAL_TABLET | Freq: Every day | ORAL | Status: DC

## 2018-07-09 MED ORDER — SODIUM CHLORIDE 0.9 % IJ SOLN
INTRAMUSCULAR | Status: AC
  Filled 2018-07-09: qty 50

## 2018-07-09 NOTE — ED Notes (Signed)
ED TO INPATIENT HANDOFF REPORT  Name/Age/Gender Dean Stanley 84 y.o. male  Code Status Code Status History    Date Active Date Inactive Code Status Order ID Comments User Context   06/25/2018 1726 06/30/2018 2307 DNR [id number]  Cristy Folks, MD Inpatient   12/05/2017 1119 12/09/2017 1636 DNR [id number]  Radene Gunning, NP ED    Questions for Most Recent Historical Code Status (Order [id number])    Question Answer Comment   In the event of cardiac or respiratory ARREST Do not call a "code blue"    In the event of cardiac or respiratory ARREST Do not perform Intubation, CPR, defibrillation or ACLS    In the event of cardiac or respiratory ARREST Use medication by any route, position, wound care, and other measures to relive pain and suffering. May use oxygen, suction and manual treatment of airway obstruction as needed for comfort.         Advance Directive Documentation     Most Recent Value  Type of Advance Directive  Healthcare Power of Attorney, Living will  Pre-existing out of facility DNR order (yellow form or pink MOST form)  -  "MOST" Form in Place?  -      Home/SNF/Other Home  Chief Complaint Chemo Card,Post Op Complaint   Level of Care/Admitting Diagnosis ED Disposition    ED Disposition Condition [place]: [place] [[id number]]  Level of Care: Med-Surg [16]  Diagnosis: Symptomatic anemia [[id number]]  Admitting Physician: Vianne Bulls [[id number]]  Attending Physician: Vianne Bulls [[id number]]  PT Class (Do Not Modify): Observation [104]  PT Acc Code (Do Not Modify): Observation [10022]       Medical History Past Medical History:  Diagnosis Date  . Anemia   . Cancer (HCC)    myelodysplastic syndrome  . Complication of anesthesia    Reaction to anesthesia " things went haywire around me"  . Do not resuscitate 11/04/2015  . Hearing impairment    wears hearing left ear  . History of ITP 1960  . Hypothyroidism    . Meniere's syndrome 1980s  . Port catheter in place 12/23/2015    Allergies Allergies  Allergen Reactions  . Percocet [Oxycodone-Acetaminophen] Nausea And Vomiting  . Hydrocodone Nausea And Vomiting  . Oxycodone Nausea And Vomiting    IV Location/Drains/Wounds Patient Lines/Drains/Airways Status   Active Line/Drains/Airways    Name:   Placement date:   Placement time:   Site:   Days:   Peripheral IV 07/09/18 Left Antecubital   07/09/18    1652    Antecubital   less than 1   Biliary Tube Cook slip-coat 10.2 Fr. RUQ   06/27/18    1525    RUQ   12   Urethral Catheter Carmen,NT Non-latex 16 Fr.   07/09/18    2002    Non-latex   less than 1          Labs/Imaging Results for orders placed or performed during the hospital encounter of 07/09/18 (from the past 48 hour(s))  CBC with Differential     Status: Abnormal (Preliminary result)   Collection Time: 07/09/18  4:31 PM  Result Value Ref Range   WBC 3.0 (L) 4.0 - 10.5 K/uL   RBC 2.28 (L) 4.22 - 5.81 MIL/uL   Hemoglobin 6.6 (LL) 13.0 - 17.0 g/dL    Comment: REPEATED TO VERIFY THIS RESULT HAS BEEN CALLED TO J,NASH RN BY AISHA MOHAMED ON 10 12  2019 AT 2029, AND HAS BEEN READ BACK. CRITICAL RESULT VERIFIED    HCT 20.9 (L) 39.0 - 52.0 %   MCV 91.7 80.0 - 100.0 fL   MCH 28.9 26.0 - 34.0 pg   MCHC 31.6 30.0 - 36.0 g/dL   RDW 16.2 (H) 11.5 - 15.5 %   Platelets 43 (L) 150 - 400 K/uL    Comment: REPEATED TO VERIFY PLATELET COUNT CONFIRMED BY SMEAR SPECIMEN CHECKED FOR CLOTS Immature Platelet Fraction may be clinically indicated, consider ordering this additional test [id number]    nRBC 1.0 (H) 0.0 - 0.2 %    Comment: Performed at [facility], [place] [address] [place], [place] [address]   Neutrophils Relative % PENDING %   Neutro Abs PENDING 1.7 - 7.7 K/uL   Band Neutrophils PENDING %   Lymphocytes Relative PENDING %   Lymphs Abs PENDING 0.7 - 4.0 K/uL   Monocytes Relative PENDING %   Monocytes Absolute  PENDING 0.1 - 1.0 K/uL   Eosinophils Relative PENDING %   Eosinophils Absolute PENDING 0.0 - 0.5 K/uL   Basophils Relative PENDING %   Basophils Absolute PENDING 0.0 - 0.1 K/uL   WBC Morphology PENDING    RBC Morphology PENDING    Smear Review PENDING    Other PENDING %   nRBC PENDING 0 /100 WBC   Metamyelocytes Relative PENDING %   Myelocytes PENDING %   Promyelocytes Relative PENDING %   Blasts PENDING %  Comprehensive metabolic panel     Status: Abnormal   Collection Time: 07/09/18  4:31 PM  Result Value Ref Range   Sodium 130 (L) 135 - 145 mmol/L   Potassium 4.6 3.5 - 5.1 mmol/L   Chloride 99 98 - 111 mmol/L   CO2 24 22 - 32 mmol/L   Glucose, Bld 108 (H) 70 - 99 mg/dL   BUN 17 8 - 23 mg/dL   Creatinine, Ser 1.07 0.61 - 1.24 mg/dL   Calcium 8.7 (L) 8.9 - 10.3 mg/dL   Total Protein 7.1 6.5 - 8.1 g/dL   Albumin 3.3 (L) 3.5 - 5.0 g/dL   AST 23 15 - 41 U/L   ALT 30 0 - 44 U/L   Alkaline Phosphatase 45 38 - 126 U/L   Total Bilirubin 1.1 0.3 - 1.2 mg/dL   GFR calc non Af Amer >60 >60 mL/min   GFR calc Af Amer >60 >60 mL/min    Comment: (NOTE) The eGFR has been calculated using the CKD EPI equation. This calculation has not been validated in all clinical situations. eGFR's persistently <60 mL/min signify possible Chronic Kidney Disease.    Anion gap 7 5 - 15    Comment: Performed at [facility], [place] [address] [place], [place] [address]  Lipase, blood     Status: Abnormal   Collection Time: 07/09/18  4:31 PM  Result Value Ref Range   Lipase 61 (H) 11 - 51 U/L    Comment: Performed at [facility], [place] [address], [place],  [address]  Urinalysis, Routine w reflex microscopic     Status: Abnormal   Collection Time: 07/09/18  7:38 PM  Result Value Ref Range   Color, Urine YELLOW YELLOW   APPearance CLEAR CLEAR   Specific Gravity, Urine 1.019 1.005 - 1.030   pH 6.0 5.0 - 8.0   Glucose, UA NEGATIVE NEGATIVE mg/dL   Hgb urine  dipstick SMALL (A) NEGATIVE   Bilirubin Urine NEGATIVE NEGATIVE  Ketones, ur NEGATIVE NEGATIVE mg/dL   Protein, ur NEGATIVE NEGATIVE mg/dL   Nitrite NEGATIVE NEGATIVE   Leukocytes, UA NEGATIVE NEGATIVE   RBC / HPF 6-10 0 - 5 RBC/hpf   Bacteria, UA NONE SEEN NONE SEEN    Comment: Performed at [facility], [place] [address], [place], [place] [address]  Type and screen [place]     Status: None (Preliminary result)   Collection Time: 07/09/18  8:09 PM  Result Value Ref Range   ABO/RH(D) PENDING    Antibody Screen PENDING    Sample Expiration      07/12/2018 Performed at [facility], [place] [address] [place], [place] [address]   Prepare RBC     Status: None   Collection Time: 07/09/18  9:00 PM  Result Value Ref Range   Order Confirmation      ORDER PROCESSED BY BLOOD BANK Performed at [facility], [place] [address], [place], [place] [address]    Ct Abdomen Pelvis W Contrast  Result Date: 07/09/2018 CLINICAL DATA:  Status post percutaneous cholecystostomy tube placement 06/27/2018. Peritonitis on exam. Abdominal pain. EXAM: CT ABDOMEN AND PELVIS WITH CONTRAST TECHNIQUE: Multidetector CT imaging of the abdomen and pelvis was performed using the standard protocol following bolus administration of intravenous contrast. CONTRAST:  131m ISOVUE-300 IOPAMIDOL (ISOVUE-300) INJECTION 61% COMPARISON:  06/17/2018 CT abdomen/pelvis. FINDINGS: Lower chest: Extensive patchy subpleural reticulation and ground-glass attenuation at both lung bases, unchanged. Patchy consolidation in the peripheral right middle lobe is similar. New mild patchy consolidation in the lingula. Hepatobiliary: Normal liver size. Simple right liver lobe cysts, largest 4.9 cm. No new liver lesions. Gallbladder is decompressed by indwelling well-positioned percutaneous cholecystostomy tube with the pigtail tip within the gallbladder lumen. Mild diffuse  gallbladder wall thickening appears decreased in the interval. No radiopaque cholelithiasis. No biliary ductal dilatation. Pancreas: Normal, with no mass or duct dilation. Spleen: Normal size spleen. Cystic 1.0 cm inferior splenic lesion is stable and considered benign. No new splenic lesions. Adrenals/Urinary Tract: Normal adrenals. Numerous nonobstructing stones in both kidneys, largest 4 mm in the interpolar right kidney and 3 mm in the lower left kidney. No hydronephrosis. No renal mass. Mildly distended bladder with small to moderate anterior left bladder wall diverticulum, unchanged. No acute bladder wall thickening. Stomach/Bowel: Normal non-distended stomach. Normal caliber small bowel with no small bowel wall thickening. Normal appendix. Large amount of stool and gas throughout the large bowel, with no large bowel wall thickening or acute pericolonic fat stranding. Vascular/Lymphatic: Atherosclerotic nonaneurysmal abdominal aorta. Patent portal, splenic, hepatic and renal veins. No pathologically enlarged lymph nodes in the abdomen or pelvis. Reproductive: Mildly enlarged prostate with nonspecific internal prostatic calcifications. Other: No pneumoperitoneum. No focal fluid collection. Trace inferior right perihepatic ascites. Musculoskeletal: No aggressive appearing focal osseous lesions. Marked thoracolumbar spondylosis. IMPRESSION: 1. Large amount of stool and gas throughout the colon, suggesting constipation. No evidence of bowel obstruction or acute bowel inflammation. 2. Percutaneous cholecystostomy tube is well positioned within the decompressed gallbladder. Gallbladder wall thickening is decreased. No abscess. Trace ascites in the inferior right perihepatic region. 3. Findings are suggestive of a mild multifocal pneumonia superimposed on nonspecific fibrosis at the lung bases. Post treatment high-resolution chest CT suggested in 3-6 months. 4. Mildly enlarged prostate. Stable left anterior bladder  wall diverticulum. Nonobstructing bilateral nephrolithiasis. 5.  Aortic Atherosclerosis (ICD10-I70.0). Electronically Signed   By: JIlona SorrelM.D.   On: 07/09/2018 19:08    Pending Labs  Unresulted Labs (From admission, onward)   None      Vitals/Pain Today's Vitals   07/09/18 1733 07/09/18 1742 07/09/18 1745 07/09/18 1800  BP:  104/61 102/61 97/60  Pulse:  78 79 80  Resp:  15    Temp:      TempSrc:      SpO2:  99% 97% 97%  Weight:      PainSc: 1        Isolation Precautions No active isolations  Medications Medications  0.9 %  sodium chloride infusion ( Intravenous New Bag/Given 07/09/18 1700)  iopamidol (ISOVUE-300) 61 % injection (has no administration in time range)  sodium chloride 0.9 % injection (has no administration in time range)  polyethylene glycol (MIRALAX / GLYCOLAX) packet 34 g (has no administration in time range)  0.9 %  sodium chloride infusion (Manually program via Guardrails IV Fluids) (has no administration in time range)  promethazine (PHENERGAN) injection 12.5 mg (12.5 mg Intravenous Given 07/09/18 1700)  HYDROmorphone (DILAUDID) injection 0.5 mg (0.5 mg Intravenous Given 07/09/18 1701)  iopamidol (ISOVUE-300) 61 % injection 100 mL (100 mLs Intravenous Contrast Given 07/09/18 1809)    Mobility Walks

## 2018-07-09 NOTE — ED Notes (Signed)
Date and time results received: 07/09/18 7:47 PM  (use smartphrase ".now" to insert current time)  Test: HGB Critical Value: 6.6  Name of Provider Notified: Dr.Kohut  Orders Received? Or Actions Taken?:None

## 2018-07-09 NOTE — ED Provider Notes (Signed)
[facility] DEPT Provider Note   CSN: [id number] Arrival date & time: 07/09/18  1530     History   Chief Complaint Chief Complaint  Patient presents with  . Post-op Problem    HPI Dean Stanley is a 84 y.o. male.  HPI.  84 y.o. 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  Patient was discharged from the hospital just over a week ago, had been doing fairly well at home until developing pain near the drainage tube entry site over the past day.  He was treated with Zosyn while he was in the hospital recently and completed a course of Bactrim at home.  He was also given red blood cell transfusion and Granix during that hospitalization.  Drain has continued to have output.  He denies any fevers or chills, denies chest pain, and denies any cough or shortness of breath. He reports generalized weakness and fatigue and reports experiencing these symptoms when his anemia worsens. Denies melena, hematochezia, or bleeding in or around the percutaneous tube.    Past Medical History:  Diagnosis Date  . Anemia   . Cancer (HCC)    myelodysplastic syndrome  . Complication of anesthesia    Reaction to anesthesia " things went haywire around me"  . Do not resuscitate 11/04/2015  . Hearing impairment    wears hearing left ear  . History of ITP 1960  . Hypothyroidism   . Meniere's syndrome 1980s  . Port catheter in place 12/23/2015    Patient Active Problem List   Diagnosis Date Noted  . RUQ pain   . Hypothyroidism 06/25/2018  . Acute cholecystitis 06/25/2018  . Port catheter in place 12/23/2015  . Do not resuscitate 11/04/2015  . Rash 10/09/2015  . MDS (myelodysplastic syndrome) ([facility]) 09/10/2015  . Neutropenia ([place])  08/27/2015  . Leukocytopenia 08/27/2015  . Meniere's syndrome   . Deficiency anemia 12/18/2014    Past Surgical History:  Procedure Laterality Date  . cataract Right 2011  . CIRCUMCISION    . COLONOSCOPY    . cortisone injections to back x 6    . IR PERC CHOLECYSTOSTOMY  06/27/2018  . ORIF ORBITAL FRACTURE Left 2000   2 fractures orbital platform        Home Medications    Prior to Admission medications   Medication Sig Start Date End Date Taking? Authorizing Provider  calcipotriene (DOVONOX) 0.005 % ointment Apply topically 2 (two) times daily as needed (inflammation).    Yes [provider]  Darbepoetin Alfa (ARANESP) 300 MCG/0.6ML SOSY injection Inject 300 mcg into the skin every 30 (thirty) days. As needed based on blood counts   Yes [provider]  desmopressin (DDAVP) 0.2 MG tablet Take 0.2 mg by mouth daily.   Yes [provider]  finasteride (PROPECIA) 1 MG tablet Take 1 mg by mouth daily with breakfast.    Yes [provider]  Flaxseed, Linseed, (FLAX PO) Take 1 tablet by mouth daily.   Yes [provider]  levothyroxine (SYNTHROID, LEVOTHROID) 175 MCG tablet Take 175 mcg by mouth at bedtime.    Yes [provider]  Lutein 40 MG CAPS Take 40 mg by mouth daily with breakfast.  10/09/15  Yes [provider]  Multiple Vitamins-Minerals (CENTRUM ADULTS PO) Take 1 tablet by mouth daily.  Yes [provider]  polyethylene glycol (MIRALAX / GLYCOLAX) packet Take 17 g by mouth daily. 07/01/18  Yes Mariel Aloe, MD  senna-docusate (SENOKOT-S) 8.6-50 MG tablet Take 2 tablets by mouth 2 (two) times daily. 06/30/18  Yes Mariel Aloe, MD  traMADol (ULTRAM) 50 MG tablet Take 50 mg by mouth every 6 (six) hours as needed for moderate pain.    Yes [provider]  triamcinolone (KENALOG) 0.025 % cream Apply 1 application topically 2 (two) times daily as needed (for inflammation).    Yes [provider]  sulfamethoxazole-trimethoprim (BACTRIM DS,SEPTRA DS) 800-160 MG tablet Take 1 tablet by mouth 2 (two) times daily for 8 days. Patient not taking: Reported on 07/09/2018 07/01/18 07/09/18  Mariel Aloe, MD    Family History Family History  Problem Relation Age of Onset  . Throat cancer Other   . Pancreatic cancer Father   . Colon cancer Brother     Social History Social History   Tobacco Use  . Smoking status: Never Smoker  . Smokeless tobacco: Never Used  Substance Use Topics  . Alcohol use: Yes    Comment: Occasional  . Drug use: No     Allergies   Percocet [oxycodone-acetaminophen]; Hydrocodone; and Oxycodone   Review of Systems Review of Systems  All systems reviewed and negative, other than as noted in HPI.  Physical Exam Updated Vital Signs BP 97/60   Pulse 80   Temp 97.7 F (36.5 C) (Oral)   Resp 15   Wt 65.8 kg   SpO2 97%   BMI 22.05 kg/m   Physical Exam  Constitutional: He appears well-developed and well-nourished. No distress.  HENT:  Head: Normocephalic and atraumatic.  Eyes: Conjunctivae are normal. Right eye exhibits no discharge. Left eye exhibits no discharge.  Neck: Neck supple.  Cardiovascular: Normal rate, regular rhythm and normal heart sounds. Exam reveals no gallop and no friction rub.  No murmur heard. Pulmonary/Chest: Effort normal and breath sounds normal. No respiratory distress.  Abdominal: Soft. He exhibits no distension. There is no tenderness.  Perc chole drain with bilious drainage.  Distention of abdomen.  Mild diffuse tenderness.  Does not seem to particularly focalized.  Musculoskeletal: He exhibits no edema or tenderness.  Neurological: He is alert.  Skin: Skin is warm and dry.  Psychiatric: He has a normal mood and affect. His behavior is normal. Thought content normal.  Nursing note and vitals reviewed.    ED Treatments / Results  Labs (all labs ordered are listed, but only abnormal results are  displayed) Labs Reviewed  COMPREHENSIVE METABOLIC PANEL - Abnormal; Notable for the following components:      Result Value   Sodium 130 (*)    Glucose, Bld 108 (*)    Calcium 8.7 (*)    Albumin 3.3 (*)    All other components within normal limits  LIPASE, BLOOD - Abnormal; Notable for the following components:   Lipase 61 (*)    All other components within normal limits  CBC WITH DIFFERENTIAL/PLATELET  URINALYSIS, ROUTINE W REFLEX MICROSCOPIC    EKG None  Radiology Ct Abdomen Pelvis W Contrast  Result Date: 07/09/2018 CLINICAL DATA:  Status post percutaneous cholecystostomy tube placement 06/27/2018. Peritonitis on exam. Abdominal pain. EXAM: CT ABDOMEN AND PELVIS WITH CONTRAST TECHNIQUE: Multidetector CT imaging of the abdomen and pelvis was performed using the standard protocol following bolus administration of intravenous contrast. CONTRAST:  17mL ISOVUE-300 IOPAMIDOL (ISOVUE-300) INJECTION 61% COMPARISON:  06/17/2018 CT  abdomen/pelvis. FINDINGS: Lower chest: Extensive patchy subpleural reticulation and ground-glass attenuation at both lung bases, unchanged. Patchy consolidation in the peripheral right middle lobe is similar. New mild patchy consolidation in the lingula. Hepatobiliary: Normal liver size. Simple right liver lobe cysts, largest 4.9 cm. No new liver lesions. Gallbladder is decompressed by indwelling well-positioned percutaneous cholecystostomy tube with the pigtail tip within the gallbladder lumen. Mild diffuse gallbladder wall thickening appears decreased in the interval. No radiopaque cholelithiasis. No biliary ductal dilatation. Pancreas: Normal, with no mass or duct dilation. Spleen: Normal size spleen. Cystic 1.0 cm inferior splenic lesion is stable and considered benign. No new splenic lesions. Adrenals/Urinary Tract: Normal adrenals. Numerous nonobstructing stones in both kidneys, largest 4 mm in the interpolar right kidney and 3 mm in the lower left kidney. No  hydronephrosis. No renal mass. Mildly distended bladder with small to moderate anterior left bladder wall diverticulum, unchanged. No acute bladder wall thickening. Stomach/Bowel: Normal non-distended stomach. Normal caliber small bowel with no small bowel wall thickening. Normal appendix. Large amount of stool and gas throughout the large bowel, with no large bowel wall thickening or acute pericolonic fat stranding. Vascular/Lymphatic: Atherosclerotic nonaneurysmal abdominal aorta. Patent portal, splenic, hepatic and renal veins. No pathologically enlarged lymph nodes in the abdomen or pelvis. Reproductive: Mildly enlarged prostate with nonspecific internal prostatic calcifications. Other: No pneumoperitoneum. No focal fluid collection. Trace inferior right perihepatic ascites. Musculoskeletal: No aggressive appearing focal osseous lesions. Marked thoracolumbar spondylosis. IMPRESSION: 1. Large amount of stool and gas throughout the colon, suggesting constipation. No evidence of bowel obstruction or acute bowel inflammation. 2. Percutaneous cholecystostomy tube is well positioned within the decompressed gallbladder. Gallbladder wall thickening is decreased. No abscess. Trace ascites in the inferior right perihepatic region. 3. Findings are suggestive of a mild multifocal pneumonia superimposed on nonspecific fibrosis at the lung bases. Post treatment high-resolution chest CT suggested in 3-6 months. 4. Mildly enlarged prostate. Stable left anterior bladder wall diverticulum. Nonobstructing bilateral nephrolithiasis. 5.  Aortic Atherosclerosis (ICD10-I70.0). Electronically Signed   By: Ilona Sorrel M.D.   On: 07/09/2018 19:08    Procedures Procedures (including critical care time)  CRITICAL CARE Performed by: Virgel Manifold Total critical care time: 35 minutes Critical care time was exclusive of separately billable procedures and treating other patients. Critical care was necessary to treat or prevent  imminent or life-threatening deterioration. Critical care was time spent personally by me on the following activities: development of treatment plan with patient and/or surrogate as well as nursing, discussions with consultants, evaluation of patient's response to treatment, examination of patient, obtaining history from patient or surrogate, ordering and performing treatments and interventions, ordering and review of laboratory studies, ordering and review of radiographic studies, pulse oximetry and re-evaluation of patient's condition.   Medications Ordered in ED Medications  0.9 %  sodium chloride infusion ( Intravenous New Bag/Given 07/09/18 1700)  iopamidol (ISOVUE-300) 61 % injection (has no administration in time range)  sodium chloride 0.9 % injection (has no administration in time range)  polyethylene glycol (MIRALAX / GLYCOLAX) packet 34 g (has no administration in time range)  promethazine (PHENERGAN) injection 12.5 mg (12.5 mg Intravenous Given 07/09/18 1700)  HYDROmorphone (DILAUDID) injection 0.5 mg (0.5 mg Intravenous Given 07/09/18 1701)  iopamidol (ISOVUE-300) 61 % injection 100 mL (100 mLs Intravenous Contrast Given 07/09/18 1809)     Initial Impression / Assessment and Plan / ED Course  I have reviewed the triage vital signs and the nursing notes.  Pertinent labs & imaging results  that were available during my care of the patient were reviewed by me and considered in my medical decision making (see chart for details).     84 year old male with increasing abdominal pain after rfecent perc chole..  Diffusely tender on exam.  Distended. Drain with good output.  CT with significant constipation.  Bladder also very distended.  Drain appropriately positioned on imaging.  He urinated about 100 cc and post void bladder scan showing almost 700cc.  A Foley catheter was placed for retention.  He feels somewhat better but still having significant abdominal discomfort.  We will try  MiraLAX and enemas.  He has a history of myelodysplastic syndrome and is increasingly anemic with a hemoglobin of 6.6.  Will transfuse.  Admission.  Final Clinical Impressions(s) / ED Diagnoses   Final diagnoses:  Constipation  Symptomatic anemia    ED Discharge Orders    None       Virgel Manifold, MD 07/19/18 [phone]

## 2018-07-09 NOTE — ED Notes (Signed)
Date and time results received: 07/09/18 2031 (use smartphrase ".now" to insert current time)  Test: hgb Critical Value: 6.6  Name of Provider Notified: Kohut md  Orders Received? Or Actions Taken?: waiting on orders

## 2018-07-09 NOTE — ED Triage Notes (Signed)
Pt reports that he is having pain around incision site on right abdomen. Pt reports having a tube insertion into the gallbladder a week ago Monday. Pt denies n/v/d. Pt reports last BM 07/07/18. Pt  Has drain in place. Wife reports no change in pain. Last chemo 2017 for leukemia.

## 2018-07-09 NOTE — H&P (Signed)
History and Physical    Dean Stanley CHE:[id number] DOB: 1931-02-04 DOA: 07/09/2018  PCP: Lorene Dy, MD   Patient coming from: Home   Chief Complaint: Abdominal pain   HPI: Dean Stanley is a 84 y.o. 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  Patient was discharged from the hospital just over a week ago, had been doing fairly well at home until developing pain near the drainage tube entry site over the past day.  He was treated with Zosyn while he was in the hospital recently and completed a course of Bactrim at home.  He was also given red blood cell transfusion and Granix during that hospitalization.  Drain has continued to have output.  He denies any fevers or chills, denies chest pain, and denies any cough or shortness of breath. He reports generalized weakness and fatigue and reports experiencing these symptoms when his anemia worsens. Denies melena, hematochezia, or bleeding in or around the percutaneous tube.   ED Course: Upon arrival to the ED, patient is found to be afebrile, saturating well on room air, and with initial blood pressure 95/55.  Chemistry panel is notable for a new hyponatremia with sodium 130.  CBC features a chronic pancytopenia with WBC 3000, platelets 43,000, and hemoglobin 6.6, down from 8.3 on October 9.  CT the abdomen pelvis reveals large amount of stool and gas throughout the colon consistent with constipation, but no obstruction or acute inflammatory findings.  CT reveals percutaneous tube well-positioned with decreased gallbladder wall thickening and no abscess.  There was suggestion of possible pneumonia in the bases on CT, but patient is not short of breath and denies any cough.  He was given Dilaudid and  Phenergan in the ED, enema and MiraLAX were ordered, and 1 unit of packed red blood cells was ordered for immediate transfusion.  She remains hemodynamically stable, in no apparent respiratory distress, and will be admitted for ongoing evaluation and management.  Review of Systems:  All other systems reviewed and apart from HPI, are negative.  Past Medical History:  Diagnosis Date  . Anemia   . Cancer (HCC)    myelodysplastic syndrome  . Complication of anesthesia    Reaction to anesthesia " things went haywire around me"  . Do not resuscitate 11/04/2015  . Hearing impairment    wears hearing left ear  . History of ITP 1960  . Hypothyroidism   . Meniere's syndrome 1980s  . Port catheter in place 12/23/2015    Past Surgical History:  Procedure Laterality Date  . cataract Right 2011  . CIRCUMCISION    . COLONOSCOPY    . cortisone injections to back x 6    . IR PERC CHOLECYSTOSTOMY  06/27/2018  . ORIF ORBITAL FRACTURE Left 2000   2 fractures orbital platform     reports that he has never smoked. He has never used smokeless tobacco. He reports that he drinks alcohol. He reports that he does not use drugs.  Allergies  Allergen Reactions  . Percocet [Oxycodone-Acetaminophen] Nausea And Vomiting  . Hydrocodone Nausea And Vomiting  . Oxycodone Nausea And Vomiting    Family History  Problem Relation Age of Onset  . Throat cancer Other   . Pancreatic cancer Father   . Colon cancer Brother      Prior to Admission medications  Medication Sig Start Date End Date Taking? Authorizing Provider  calcipotriene (DOVONOX) 0.005 % ointment Apply topically 2 (two) times daily as needed (inflammation).    Yes [provider]  Darbepoetin Alfa (ARANESP) 300 MCG/0.6ML SOSY injection Inject 300 mcg into the skin every 30 (thirty) days. As needed based on blood counts   Yes [provider]  desmopressin (DDAVP) 0.2 MG tablet Take 0.2 mg by mouth daily.   Yes [provider]  finasteride (PROPECIA) 1 MG tablet Take 1 mg by mouth daily with breakfast.    Yes [provider]  Flaxseed, Linseed, (FLAX PO) Take 1 tablet by mouth daily.   Yes [provider]  levothyroxine (SYNTHROID, LEVOTHROID) 175 MCG tablet Take 175 mcg by mouth at bedtime.    Yes [provider]  Lutein 40 MG CAPS Take 40 mg by mouth daily with breakfast.  10/09/15  Yes [provider]  Multiple Vitamins-Minerals (CENTRUM ADULTS PO) Take 1 tablet by mouth daily.    Yes [provider]  polyethylene glycol (MIRALAX / GLYCOLAX) packet Take 17 g by mouth daily. 07/01/18  Yes Mariel Aloe, MD  senna-docusate (SENOKOT-S) 8.6-50 MG tablet Take 2 tablets by mouth 2 (two) times daily. 06/30/18  Yes Mariel Aloe, MD  traMADol (ULTRAM) 50 MG tablet Take 50 mg by mouth every 6 (six) hours as needed for moderate pain.    Yes [provider]  triamcinolone (KENALOG) 0.025 % cream Apply 1 application topically 2 (two) times daily as needed (for inflammation).    Yes [provider]    Physical Exam: Vitals:   07/09/18 1742 07/09/18 1745 07/09/18 1800 07/09/18 2210  BP: 104/61 102/61 97/60 (!) 104/57  Pulse: 78 79 80 87  Resp: 15   18  Temp:    97.9 F (36.6 C)  TempSrc:    Oral  SpO2: 99% 97% 97% 92%  Weight:        Constitutional: NAD, calm, cachectic  Eyes: PERTLA, lids and conjunctivae normal ENMT: Mucous membranes are moist. Posterior pharynx clear of any exudate or lesions.   Neck: normal, supple, no masses, no thyromegaly Respiratory: clear to auscultation bilaterally, no wheezing, no crackles. Normal respiratory effort.    Cardiovascular: S1 & S2 heard, regular rate and rhythm. No extremity edema.   Abdomen: Mild distension. Generally tender, more so on the right, no rebound pain or guarding. Bowel sounds active.  Musculoskeletal: no clubbing / cyanosis. No joint deformity upper and lower extremities.   Skin: no  significant rashes, lesions, ulcers. Warm, dry, well-perfused. Neurologic: CN 2-12 grossly intact. Sensation intact. Moving all extremities.  Psychiatric: Alert and oriented x 3. Calm, cooperative.     Labs on Admission: I have personally reviewed following labs and imaging studies  CBC: Recent Labs  Lab 07/06/18 1033 07/09/18 1631  WBC 2.5* 3.0*  NEUTROABS 0.4* PENDING  HGB 8.3* 6.6*  HCT 25.1* 20.9*  MCV 89.6 91.7  PLT 48* 43*   Basic Metabolic Panel: Recent Labs  Lab 07/09/18 1631  NA 130*  K 4.6  CL 99  CO2 24  GLUCOSE 108*  BUN 17  CREATININE 1.07  CALCIUM 8.7*   GFR: Estimated Creatinine Clearance: 47 mL/min (by C-G formula based on SCr of 1.07 mg/dL). Liver Function Tests: Recent Labs  Lab 07/09/18 1631  AST 23  ALT 30  ALKPHOS 45  BILITOT 1.1  PROT 7.1  ALBUMIN 3.3*   Recent Labs  Lab 07/09/18 1631  LIPASE  61*   No results for input(s): AMMONIA in the last 168 hours. Coagulation Profile: No results for input(s): INR, PROTIME in the last 168 hours. Cardiac Enzymes: No results for input(s): CKTOTAL, CKMB, CKMBINDEX, TROPONINI in the last 168 hours. BNP (last 3 results) No results for input(s): PROBNP in the last 8760 hours. HbA1C: No results for input(s): HGBA1C in the last 72 hours. CBG: No results for input(s): GLUCAP in the last 168 hours. Lipid Profile: No results for input(s): CHOL, HDL, LDLCALC, TRIG, CHOLHDL, LDLDIRECT in the last 72 hours. Thyroid Function Tests: No results for input(s): TSH, T4TOTAL, FREET4, T3FREE, THYROIDAB in the last 72 hours. Anemia Panel: No results for input(s): VITAMINB12, FOLATE, FERRITIN, TIBC, IRON, RETICCTPCT in the last 72 hours. Urine analysis:    Component Value Date/Time   COLORURINE YELLOW 07/09/2018 1938   APPEARANCEUR CLEAR 07/09/2018 1938   LABSPEC 1.019 07/09/2018 1938   PHURINE 6.0 07/09/2018 1938   GLUCOSEU NEGATIVE 07/09/2018 1938   HGBUR SMALL (A) 07/09/2018 1938   BILIRUBINUR  NEGATIVE 07/09/2018 1938   KETONESUR NEGATIVE 07/09/2018 1938   PROTEINUR NEGATIVE 07/09/2018 1938   NITRITE NEGATIVE 07/09/2018 1938   LEUKOCYTESUR NEGATIVE 07/09/2018 1938   Sepsis Labs: @LABRCNTIP (procalcitonin:4,lacticidven:4) )No results found for this or any previous visit (from the past 240 hour(s)).   Radiological Exams on Admission: Ct Abdomen Pelvis W Contrast  Result Date: 07/09/2018 CLINICAL DATA:  Status post percutaneous cholecystostomy tube placement 06/27/2018. Peritonitis on exam. Abdominal pain. EXAM: CT ABDOMEN AND PELVIS WITH CONTRAST TECHNIQUE: Multidetector CT imaging of the abdomen and pelvis was performed using the standard protocol following bolus administration of intravenous contrast. CONTRAST:  15mL ISOVUE-300 IOPAMIDOL (ISOVUE-300) INJECTION 61% COMPARISON:  06/17/2018 CT abdomen/pelvis. FINDINGS: Lower chest: Extensive patchy subpleural reticulation and ground-glass attenuation at both lung bases, unchanged. Patchy consolidation in the peripheral right middle lobe is similar. New mild patchy consolidation in the lingula. Hepatobiliary: Normal liver size. Simple right liver lobe cysts, largest 4.9 cm. No new liver lesions. Gallbladder is decompressed by indwelling well-positioned percutaneous cholecystostomy tube with the pigtail tip within the gallbladder lumen. Mild diffuse gallbladder wall thickening appears decreased in the interval. No radiopaque cholelithiasis. No biliary ductal dilatation. Pancreas: Normal, with no mass or duct dilation. Spleen: Normal size spleen. Cystic 1.0 cm inferior splenic lesion is stable and considered benign. No new splenic lesions. Adrenals/Urinary Tract: Normal adrenals. Numerous nonobstructing stones in both kidneys, largest 4 mm in the interpolar right kidney and 3 mm in the lower left kidney. No hydronephrosis. No renal mass. Mildly distended bladder with small to moderate anterior left bladder wall diverticulum, unchanged. No acute  bladder wall thickening. Stomach/Bowel: Normal non-distended stomach. Normal caliber small bowel with no small bowel wall thickening. Normal appendix. Large amount of stool and gas throughout the large bowel, with no large bowel wall thickening or acute pericolonic fat stranding. Vascular/Lymphatic: Atherosclerotic nonaneurysmal abdominal aorta. Patent portal, splenic, hepatic and renal veins. No pathologically enlarged lymph nodes in the abdomen or pelvis. Reproductive: Mildly enlarged prostate with nonspecific internal prostatic calcifications. Other: No pneumoperitoneum. No focal fluid collection. Trace inferior right perihepatic ascites. Musculoskeletal: No aggressive appearing focal osseous lesions. Marked thoracolumbar spondylosis. IMPRESSION: 1. Large amount of stool and gas throughout the colon, suggesting constipation. No evidence of bowel obstruction or acute bowel inflammation. 2. Percutaneous cholecystostomy tube is well positioned within the decompressed gallbladder. Gallbladder wall thickening is decreased. No abscess. Trace ascites in the inferior right perihepatic region. 3. Findings are suggestive of a mild multifocal pneumonia  superimposed on nonspecific fibrosis at the lung bases. Post treatment high-resolution chest CT suggested in 3-6 months. 4. Mildly enlarged prostate. Stable left anterior bladder wall diverticulum. Nonobstructing bilateral nephrolithiasis. 5.  Aortic Atherosclerosis (ICD10-I70.0). Electronically Signed   By: Ilona Sorrel M.D.   On: 07/09/2018 19:08    EKG: Not performed.   Assessment/Plan   1. MDS, leukemia  - Presents with increasing abdominal pain after recent admission for acute cholecystitis, and he is found to have Hgb of 6.6, down from 8.3 on 07/06/18  - There is no bleeding and this is likely secondary to MDS that has progressed to leukemia, and for which he follows at [facility], was treated with Granix and RBC transfusion during recent admission  - One unit RBC  ordered for transfusion from ED  - Check post-transfusion CBC, continue supportive care    2. Constipation  - Recently admitted with acute cholecystitis, percutaneous drain placed by IR, and he has completed antibiotics, but now presents with increasing abdominal pain  - CT abd/pevlis reveals constipation, but is otherwise reassuring  - Treated with enema in ED and will be continued on daily MiraLax and senna   3. Cholecystitis  - Admitted two weeks ago with acute cholecystitis  - Percutaneous tube was placed by IR on 06/27/17 and he was treated with Zosyn in hospital and has since completed a course of Bactrim at home  - Percutaneous tube appears well-positioned on CT, gallbladder wall thickening has decreased, there is no fever  - Continue drain care  - Plans to follow-up with IR in 3 weeks    4. Hyponatremia  - Serum sodium is 130 on admission in setting of hypovolemia  - Treated with IVF in ED and continued on normal saline infusion  - Takes DDAVP, but sodium had been normal  - Repeat chem panel in am    5. Hypothyroidism  - Continue Synthroid   6. Urinary retention  - Foley placed in ED and draining   7. Nocturia  - Foley was placed in ED - Hold DDAVP for now in light of hyponatremia    DVT prophylaxis: SCD's  Code Status: DNR  Family Communication: Family updated at bedside Consults called: None Admission status: Observation     Vianne Bulls, MD [facility] Pager [phone]  If 7PM-7AM, please contact night-coverage [email or web address] Password [email or web address]  07/09/2018, 10:12 PM

## 2018-07-10 ENCOUNTER — Other Ambulatory Visit: Payer: Self-pay

## 2018-07-10 DIAGNOSIS — D649 Anemia, unspecified: Secondary | ICD-10-CM

## 2018-07-10 LAB — BASIC METABOLIC PANEL
Anion gap: 7 (ref 5–15)
BUN: 12 mg/dL (ref 8–23)
CALCIUM: 8.1 mg/dL — AB (ref 8.9–10.3)
CO2: 22 mmol/L (ref 22–32)
CREATININE: 0.79 mg/dL (ref 0.61–1.24)
Chloride: 103 mmol/L (ref 98–111)
GFR calc Af Amer: >60 mL/min (ref >60)
GFR calc non Af Amer: >60 mL/min (ref >60)
GLUCOSE: 94 mg/dL (ref 70–99)
Potassium: 4 mmol/L (ref 3.5–5.1)
Sodium: 132 mmol/L — ABNORMAL LOW (ref 135–145)

## 2018-07-10 LAB — CBC
HCT: 23.4 % — ABNORMAL LOW (ref 39.0–52.0)
Hemoglobin: 7.4 g/dL — ABNORMAL LOW (ref 13.0–17.0)
MCH: 28.9 pg (ref 26.0–34.0)
MCHC: 31.6 g/dL (ref 30.0–36.0)
MCV: 91.4 fL (ref 80.0–100.0)
PLATELETS: 41 10*3/uL — AB (ref 150–400)
RBC: 2.56 MIL/uL — ABNORMAL LOW (ref 4.22–5.81)
RDW: 15.6 % — AB (ref 11.5–15.5)
WBC: 2.7 10*3/uL — ABNORMAL LOW (ref 4.0–10.5)
nRBC: 0 % (ref 0.0–0.2)

## 2018-07-10 MED ORDER — SODIUM CHLORIDE 0.9 % IV SOLN
INTRAVENOUS | Status: DC
  Administered 2018-07-10 – 2018-07-11 (×2): via INTRAVENOUS

## 2018-07-10 MED ORDER — SORBITOL 70 % SOLN
960.0000 mL | TOPICAL_OIL | Freq: Once | ORAL | Status: AC
  Administered 2018-07-10: 960 mL via RECTAL
  Filled 2018-07-10: qty 473

## 2018-07-10 MED ORDER — KETOROLAC TROMETHAMINE 30 MG/ML IJ SOLN
15.0000 mg | Freq: Once | INTRAMUSCULAR | Status: AC
  Administered 2018-07-10: 15 mg via INTRAVENOUS
  Filled 2018-07-10: qty 1

## 2018-07-10 MED ORDER — SORBITOL 70 % SOLN
30.0000 mL | Freq: Two times a day (BID) | Status: DC
  Administered 2018-07-10 – 2018-07-12 (×5): 30 mL via ORAL
  Filled 2018-07-10 (×5): qty 30

## 2018-07-10 NOTE — Progress Notes (Signed)
[facility] PROGRESS NOTE  Dean Stanley ZDG:[id number] DOB: 10/04/1930 DOA: 07/09/2018 PCP: Lorene Dy, MD   Narrative: 84 year old male myelodysplastic syndrome + refractory anemia + blasts = acute leukemia Hypothyroid Recent Rx acute cholecystitis 9/28-10/3-cultures at that time grew Enterobacter-was on Zosyn/cefepime and finally discharged on Bactrim and was recommended home health PT which patient declined Note that is followed for acute leukemia [facility] and has been offered hospice in the past which again has been declined Patient also has nocturia and is on DDAVP?  SIADH?  Other causes  Returns to emergency room 10/12 with recurrent abdominal pain-labs significant for anemia in the 6.6 range down from a baseline of 8.3 on discharge 10/9 CT scan showed large amount of stool and gas suggestive of constipation PERC drain was well-positioned with decompressed gallbladder?  Also mild multifocal pneumonia with nonspecific fibrosis lung bases and a mildly enlarged prostate gland Foley catheter needed to be placed secondary to retention of 700 cc of urine on admission   A & Plan Abdominal pain?  Constipation? -most likely constipated because of  Reasonable result with a smog enema today Start sorbitol twice daily in addition to senna Recheck abdominal x-ray a.m.  Anemia in the setting of acute leukemia-transfuse 1 unit of packed red blood cells hemoglobin rise appropriate-see below  Nocturia on DDAVP-sodium levels are appropriate have been that way since last admission-defer to specialist who prescribes DDAVP whether this is absolutely necessary  Foley trauma and indwelling catheter currently secondary to retention-likely secondary to constipation and mild prostatism-start Flomax 0.4  Hypothyroid-outpatient TSH  Elevated lipase-usually is a nonspecific finding-if continued to have abdominal pain may need scanning versus drain study  Constitutional hypotension in the  90s-this is baseline for him  Advanced leukemia-followed at [place] states that his prognosis 2 to 6 months-will need outpatient follow-up and coordination of care  DNR confirmed at bedside, long discussion with wife and 2 daughters, inpatient pending resolution, Erline Levine, MD  [facility] Direct contact: [phone] --Via amion app OR  --[email or web address]; password [email or web address]  7PM-7AM contact night coverage as above 07/10/2018, 10:30 AM  LOS: 0 days   Consultants:  n  Procedures:  n  Antimicrobials:  n  Interval history/Subjective: Awake alert no distress tolerating some diet Had a reasonable ball brown-colored stool with smog enema No fever no chills Nursing reports some blood from tip of penis with probably having catheter pulled and is about to flush the same  Objective:  Vitals:  Vitals:   07/10/18 0210 07/10/18 0536  BP: (!) 96/57 (!) 102/48  Pulse: 88 84  Resp: 16 17  Temp: 99.9 F (37.7 C) 98.9 F (37.2 C)  SpO2: 93% 93%    Exam:  Awake pleasant bitemporal wasting Mild excoriation to back of throat no thrush poor dentition Mallampati 1 Neck soft supple Abdomen distended slight tenderness Foley in place with slight dark stained blood over catheter no further overt bleeding-catheter tube and bag has pink-tinged urine No lower extremity edema No rash Chest is clear Neurologically intact  I have personally reviewed the following:   Labs:  Sodium 132  Hemoglobin up from 6-7.4, platelets 41, WBC 2.7   Imaging studies:  n  Medical tests:  n   Test discussed with performing physician:  n  Decision to obtain old records:  n  Review and summation of old records:  n  Scheduled Meds: . levothyroxine  175 mcg Oral QHS  . senna-docusate  2 tablet Oral BID  .  sorbitol  30 mL Oral BID   Continuous Infusions:  Principal Problem:   Symptomatic anemia Active Problems:   Pancytopenia (HCC)   MDS  (myelodysplastic syndrome) (HCC)   Hypothyroidism   Cholecystitis   Constipation   Hyponatremia   LOS: 0 days

## 2018-07-11 ENCOUNTER — Telehealth: Payer: Self-pay | Admitting: *Deleted

## 2018-07-11 ENCOUNTER — Observation Stay (HOSPITAL_COMMUNITY): Payer: Medicare Other

## 2018-07-11 DIAGNOSIS — D649 Anemia, unspecified: Secondary | ICD-10-CM | POA: Diagnosis not present

## 2018-07-11 DIAGNOSIS — K5900 Constipation, unspecified: Secondary | ICD-10-CM | POA: Diagnosis present

## 2018-07-11 DIAGNOSIS — Z66 Do not resuscitate: Secondary | ICD-10-CM | POA: Diagnosis present

## 2018-07-11 DIAGNOSIS — E861 Hypovolemia: Secondary | ICD-10-CM | POA: Diagnosis present

## 2018-07-11 DIAGNOSIS — R338 Other retention of urine: Secondary | ICD-10-CM | POA: Diagnosis present

## 2018-07-11 DIAGNOSIS — R748 Abnormal levels of other serum enzymes: Secondary | ICD-10-CM | POA: Diagnosis present

## 2018-07-11 DIAGNOSIS — Z808 Family history of malignant neoplasm of other organs or systems: Secondary | ICD-10-CM | POA: Diagnosis not present

## 2018-07-11 DIAGNOSIS — E039 Hypothyroidism, unspecified: Secondary | ICD-10-CM | POA: Diagnosis present

## 2018-07-11 DIAGNOSIS — D61818 Other pancytopenia: Secondary | ICD-10-CM | POA: Diagnosis present

## 2018-07-11 DIAGNOSIS — R351 Nocturia: Secondary | ICD-10-CM | POA: Diagnosis present

## 2018-07-11 DIAGNOSIS — N401 Enlarged prostate with lower urinary tract symptoms: Secondary | ICD-10-CM | POA: Diagnosis present

## 2018-07-11 DIAGNOSIS — Z800 Family history of malignant neoplasm of digestive organs: Secondary | ICD-10-CM | POA: Diagnosis not present

## 2018-07-11 DIAGNOSIS — R109 Unspecified abdominal pain: Secondary | ICD-10-CM | POA: Diagnosis present

## 2018-07-11 DIAGNOSIS — I959 Hypotension, unspecified: Secondary | ICD-10-CM | POA: Diagnosis present

## 2018-07-11 DIAGNOSIS — D469 Myelodysplastic syndrome, unspecified: Secondary | ICD-10-CM | POA: Diagnosis present

## 2018-07-11 DIAGNOSIS — C9500 Acute leukemia of unspecified cell type not having achieved remission: Secondary | ICD-10-CM | POA: Diagnosis present

## 2018-07-11 DIAGNOSIS — E871 Hypo-osmolality and hyponatremia: Secondary | ICD-10-CM | POA: Diagnosis present

## 2018-07-11 LAB — BPAM RBC
Blood Product Expiration Date: 201911022359
ISSUE DATE / TIME: 201910122328
Unit Type and Rh: 6200

## 2018-07-11 LAB — BASIC METABOLIC PANEL
Anion gap: 10 (ref 5–15)
BUN: 9 mg/dL (ref 8–23)
CO2: 21 mmol/L — ABNORMAL LOW (ref 22–32)
CREATININE: 0.54 mg/dL — AB (ref 0.61–1.24)
Calcium: 8 mg/dL — ABNORMAL LOW (ref 8.9–10.3)
Chloride: 104 mmol/L (ref 98–111)
GFR calc Af Amer: >60 mL/min (ref >60)
Glucose, Bld: 93 mg/dL (ref 70–99)
Potassium: 4 mmol/L (ref 3.5–5.1)
Sodium: 135 mmol/L (ref 135–145)

## 2018-07-11 LAB — CBC WITH DIFFERENTIAL/PLATELET
BASOS PCT: 1 %
Basophils Absolute: 0 10*3/uL (ref 0.0–0.1)
EOS PCT: 0 %
Eosinophils Absolute: 0 10*3/uL (ref 0.0–0.5)
HCT: 23 % — ABNORMAL LOW (ref 39.0–52.0)
HEMOGLOBIN: 7.3 g/dL — AB (ref 13.0–17.0)
Lymphocytes Relative: 29 %
Lymphs Abs: 1 10*3/uL (ref 0.7–4.0)
MCH: 29.2 pg (ref 26.0–34.0)
MCHC: 31.7 g/dL (ref 30.0–36.0)
MCV: 92 fL (ref 80.0–100.0)
MONO ABS: 0.6 10*3/uL (ref 0.1–1.0)
Monocytes Relative: 19 %
Neutro Abs: 1.2 10*3/uL — ABNORMAL LOW (ref 1.7–7.7)
Neutrophils Relative %: 35 %
PLATELETS: 44 10*3/uL — AB (ref 150–400)
RBC: 2.5 MIL/uL — ABNORMAL LOW (ref 4.22–5.81)
RDW: 15.9 % — ABNORMAL HIGH (ref 11.5–15.5)
WBC: 3.3 10*3/uL — AB (ref 4.0–10.5)
nRBC: 0 % (ref 0.0–0.2)

## 2018-07-11 LAB — TYPE AND SCREEN
ABO/RH(D): A POS
Antibody Screen: NEGATIVE
Unit division: 0

## 2018-07-11 LAB — PATHOLOGIST SMEAR REVIEW

## 2018-07-11 MED ORDER — MAGIC MOUTHWASH
2.0000 mL | Freq: Three times a day (TID) | ORAL | Status: DC | PRN
  Administered 2018-07-11 (×2): 2 mL via ORAL
  Filled 2018-07-11 (×4): qty 5

## 2018-07-11 MED ORDER — PEG 3350-KCL-NA BICARB-NACL 420 G PO SOLR
4000.0000 mL | Freq: Once | ORAL | Status: AC
  Administered 2018-07-11: 4000 mL via ORAL
  Filled 2018-07-11: qty 4000

## 2018-07-11 NOTE — Progress Notes (Signed)
[facility] PROGRESS NOTE  Dean Stanley NLG:[id number] DOB: 07-14-1931 DOA: 07/09/2018 PCP: Lorene Dy, MD   Narrative:  84 year old male myelodysplastic syndrome + refractory anemia + blasts = acute leukemia Hypothyroid Recent Rx acute cholecystitis 9/28-10/3-cultures at that time grew Enterobacter-was on Zosyn/cefepime and finally discharged on Bactrim and was recommended home health PT which patient declined Note that is followed for acute leukemia [facility] and has been offered hospice in the past which again has been declined Patient also has nocturia and is on DDAVP?  SIADH?  Other causes  Returns to emergency room 10/12 with recurrent abdominal pain-labs significant for anemia in the 6.6 range down from a baseline of 8.3 on discharge 10/9 CT scan showed large amount of stool and gas suggestive of constipation PERC drain was well-positioned with decompressed gallbladder?  Also mild multifocal pneumonia with nonspecific fibrosis lung bases and a mildly enlarged prostate gland Foley catheter needed to be placed secondary to retention of 700 cc of urine on admission   A & Plan severe constipation?  Start Bowel prep 4 liters Start sorbitol twice daily in addition to senna Check am labs  Anemia in the setting of acute leukemia-transfused 1 unit PRBC---stable at this time  Nocturia on DDAVP-sodium levels are appropriate have been that way since last admission-defer to specialist who prescribes DDAVP whether this is absolutely necessary  Foley trauma and indwelling catheter currently secondary to retention-likely secondary to constipation and mild prostatism-start Flomax 0.4 Will need foley and OP urology follow-up  Hypothyroid-outpatient TSH  Elevated lipase-usually is a nonspecific finding-if continued to have abdominal pain may need scanning versus drain study  Constitutional hypotension in the 90s-this is baseline for him  Advanced leukemia-followed at [place] states that his prognosis 2 to 6 months-will need outpatient follow-up and coordination of care  DNR confirmed at bedside, long discussion with wife and 2 daughters, inpatient pending resolution, Erline Levine, MD  [facility] Direct contact: [phone] --Via amion app OR  --[email or web address]; password [email or web address]  7PM-7AM contact night coverage as above 07/11/2018, 12:18 PM  LOS: 0 days   Consultants:  n  Procedures:  n  Antimicrobials:  n  Interval history/Subjective:  Awake alert in nad No n/v some abd pain No fever no chills  No cp    Objective:  Vitals:  Vitals:   07/10/18 1936 07/11/18 0451  BP: (!) 109/57 105/60  Pulse: 85 81  Resp: 14 16  Temp: 98.9 F (37.2 C) 97.9 F (36.6 C)  SpO2: 95% 97%    Exam:  bitemporal wasting Mild excoriation to back of throat no thrush  Mallampati 1 Neck soft supple Abdomen distended slight tenderness Foley drain is clear No rash Chest is clear Neurologically intact  I have personally reviewed the following:   Labs:  Sodium 135  Hemoglobin up from 6-7.4--->7.3, platelets 41,   WBC 2.7-->3.3  Imaging studies:  n  Medical tests:  n   Test discussed with performing physician:  n  Decision to obtain old records:  n  Review and summation of old records:  n  Scheduled Meds: . levothyroxine  175 mcg Oral QHS  . polyethylene glycol-electrolytes  4,000 mL Oral Once  . senna-docusate  2 tablet Oral BID  . sorbitol  30 mL Oral BID   Continuous Infusions: . sodium chloride 85 mL/hr at 07/11/18 0600    Principal Problem:   Symptomatic anemia Active Problems:   Pancytopenia (HCC)   MDS (myelodysplastic syndrome) (HCC)  Hypothyroidism   Cholecystitis   Constipation   Hyponatremia   LOS: 0 days

## 2018-07-11 NOTE — Progress Notes (Signed)
Dean Stanley up to restroom with one assist for SSE. Patient voiced concern that, "I been waiting for this enema since 10 pm." Informed patient that order for the enema was obtained at 2330 and that I made rounds right after obtaining this order and 1am and he was sleeping. Patient stated that I should have woke him up. Informed patient that rest is part of a patient's plan of care.  This patient voiced understanding.  Patient tolerated SSE well with little results. Assisted back to bed.

## 2018-07-11 NOTE — Telephone Encounter (Signed)
Received TC from pt's wife.  She states pt in currently an in-patient @ [facility].  He was scheduled to come to [facility] tomorrow for labs and possible transfusion tomorrow. Wife states she doesn't think he will be discharged by tomorrow and to please cancel appts for tommorow. Informed wife that this nurse will take care of tomorrow's appts.

## 2018-07-11 NOTE — Progress Notes (Signed)
Order obtained for SS enema.  Patient, Dean Stanley resting quietly in bed at this time with no distress noted. Unable to verbally awake this patient. Will hold SSE for now and let [name] sleep. Will continue to monitor and assess patient needs and concerns.

## 2018-07-11 NOTE — Progress Notes (Signed)
Precious Bard c/o "not being able to move my bowel" and requested an enema. [facility] notified of the above concern and request of this patient. Informed Mr Patricia Pesa MD been notified.

## 2018-07-12 ENCOUNTER — Ambulatory Visit: Payer: Medicare Other

## 2018-07-12 ENCOUNTER — Other Ambulatory Visit: Payer: Medicare Other

## 2018-07-12 LAB — COMPREHENSIVE METABOLIC PANEL
ALBUMIN: 2.7 g/dL — AB (ref 3.5–5.0)
ALK PHOS: 51 U/L (ref 38–126)
ALT: 25 U/L (ref 0–44)
AST: 21 U/L (ref 15–41)
Anion gap: 8 (ref 5–15)
BUN: 8 mg/dL (ref 8–23)
CO2: 23 mmol/L (ref 22–32)
CREATININE: 0.52 mg/dL — AB (ref 0.61–1.24)
Calcium: 7.8 mg/dL — ABNORMAL LOW (ref 8.9–10.3)
Chloride: 107 mmol/L (ref 98–111)
GFR calc Af Amer: >60 mL/min (ref >60)
GFR calc non Af Amer: >60 mL/min (ref >60)
GLUCOSE: 95 mg/dL (ref 70–99)
POTASSIUM: 4 mmol/L (ref 3.5–5.1)
Sodium: 138 mmol/L (ref 135–145)
Total Bilirubin: 0.8 mg/dL (ref 0.3–1.2)
Total Protein: 6.6 g/dL (ref 6.5–8.1)

## 2018-07-12 LAB — MAGNESIUM: Magnesium: 2 mg/dL (ref 1.7–2.4)

## 2018-07-12 MED ORDER — MAGIC MOUTHWASH: 2.0000 mL | Freq: Three times a day (TID) | ORAL | 0 refills | Status: DC | PRN

## 2018-07-12 MED ORDER — SORBITOL 70 % SOLN: 30.0000 mL | Freq: Two times a day (BID) | 0 refills | Status: AC

## 2018-07-12 NOTE — Progress Notes (Signed)
Wife flushed drain and changed dressing per home instructions. Foley leg bag has been placed and patient and wife instructed on draining and foley care. Will provides discharge instructions to patient and wife.

## 2018-07-12 NOTE — Discharge Summary (Signed)
Physician Discharge Summary  Dean Stanley ZOX:[id number] DOB: [birth year] DOA: 07/09/2018  PCP: Lorene Dy, MD  Admit date: 07/09/2018 Discharge date: 07/12/2018  Time spent: 45 minutes  Recommendations for Outpatient Follow-up:  1. Needs outpatient follow-up with Dr. Diona Fanti regarding urinary retention 2. Would recommend outpatient imaging if further abdominal pain from this was mild on discharge 3. Recommend sorbitol as dictated below 4. 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  SIADH?  Other causes  Returns to emergency room 10/12 with recurrent abdominal pain-labs significant for anemia in the 6.6 range down from a baseline of 8.3 on discharge 10/9 CT scan showed large amount of stool and gas suggestive of constipation PERC drain was well-positioned with decompressed gallbladder?  Also mild multifocal pneumonia with nonspecific fibrosis lung bases and a mildly enlarged prostate gland Foley catheter needed to be placed secondary to retention of 700 cc of urine on admission  Hospital Course:  severe constipation?  Start Bowel prep 4  liters Start sorbitol twice daily and discharged on the same  Anemia in the setting of acute leukemia-transfused 1 unit PRBC---stable at this time Needs screening labs as per protocol as an outpatient  Abdominal pain-recommend outpatient imaging if persistent or severe at this time it is stable  Nocturia on DDAVP-sodium levels are appropriate have been that way since last admission-defer to specialist who prescribes DDAVP whether this is absolutely necessary  Foley trauma and indwelling catheter currently secondary to retention-likely secondary to constipation and mild prostatism-start Flomax 0.4 Will need foley and OP urology follow-up  Hypothyroid-outpatient TSH  Elevated lipase-usually is a nonspecific finding-if continued to have abdominal pain may need scanning versus drain study  Constitutional hypotension in the 90s-this is baseline for him  Advanced leukemia-followed at [facility] states that his prognosis 2 to 6 months-will need outpatient follow-up and coordination of care   Discharge Exam: Vitals:   07/11/18 2152 07/12/18 0500  BP: 123/67 126/70  Pulse: 83 79  Resp: 16 17  Temp: 98 F (36.7 C) 98.4 F (36.9 C)  SpO2: 95% 97%    General: Awake alert pleasant no distress Cardiovascular: S1-S2 no murmur no rub no gallop Respiratory: Clinically clear no added sound Abdomen is soft slightly tender in right upper quadrant drain is draining well Foley catheter in place Neurologically intact  Discharge Instructions   Discharge Instructions    Diet - low sodium heart healthy   Complete by:  As directed    Discharge instructions   Complete by:  As directed    Although he has been prescribed twice a day and I would recommend that you start with once daily sorbitol and increase it to twice a day if you do not have  the effect of a laxative effect You may need further imaging of your abdomen and pelvis if you have severe pain-I would recommend an  outpatient CT scan at the discretion of your regular doctors however at this juncture I do not think that you need that done as your drain in your gallbladder is draining fairly well God bless You will need education from the nurses and follow-up with Dr. Diona Fanti in the outpatient setting with regards to voiding trials and you will learn how to use a leg bag prior to discharge and you can get the supplies at a medical supply store   Increase activity slowly   Complete by:  As directed    Urinary leg bag   Complete by:  As directed      Allergies as of 07/12/2018      Reactions   Percocet [oxycodone-acetaminophen] Nausea And Vomiting   Hydrocodone Nausea And Vomiting   Oxycodone Nausea And Vomiting      Medication List    TAKE these medications   calcipotriene 0.005 % ointment Commonly known as:  DOVONOX Apply topically 2 (two) times daily as needed (inflammation).   CENTRUM ADULTS PO Take 1 tablet by mouth daily.   Darbepoetin Alfa 300 MCG/0.6ML Sosy injection Commonly known as:  ARANESP Inject 300 mcg into the skin every 30 (thirty) days. As needed based on blood counts   desmopressin 0.2 MG tablet Commonly known as:  DDAVP Take 0.2 mg by mouth daily.   finasteride 1 MG tablet Commonly known as:  PROPECIA Take 1 mg by mouth daily with breakfast.   FLAX PO Take 1 tablet by mouth daily.   levothyroxine 175 MCG tablet Commonly known as:  SYNTHROID, LEVOTHROID Take 175 mcg by mouth at bedtime.   Lutein 40 MG Caps Take 40 mg by mouth daily with breakfast.   magic mouthwash Soln Take 2 mLs by mouth 3 (three) times daily as needed for mouth pain.   polyethylene glycol packet Commonly known as:  MIRALAX / GLYCOLAX Take 17 g by mouth daily.   senna-docusate 8.6-50 MG tablet Commonly known as:  Senokot-S Take 2 tablets by mouth 2 (two) times daily.   sorbitol 70 % Soln Take 30 mLs by mouth 2 (two) times daily.   traMADol 50 MG tablet Commonly known as:   ULTRAM Take 50 mg by mouth every 6 (six) hours as needed for moderate pain.   triamcinolone 0.025 % cream Commonly known as:  KENALOG Apply 1 application topically 2 (two) times daily as needed (for inflammation).      Allergies  Allergen Reactions  . Percocet [Oxycodone-Acetaminophen] Nausea And Vomiting  . Hydrocodone Nausea And Vomiting  . Oxycodone Nausea And Vomiting      The results of significant diagnostics from this hospitalization (including imaging, microbiology, ancillary and laboratory) are listed below for reference.    Significant Diagnostic Studies: Ct Chest W Contrast  Result Date: 06/14/2018 CLINICAL DATA:  84 year old male with history of coughing congestion. Former smoker. Patient is on antibiotics. EXAM: CT CHEST WITH CONTRAST TECHNIQUE: Multidetector CT imaging of the chest was performed during intravenous contrast administration. CONTRAST:  29mL ISOVUE-300 IOPAMIDOL (ISOVUE-300) INJECTION 61% COMPARISON:  No priors. FINDINGS: Creatinine was obtained on site at [place] at [address] Results: Creatinine 0.7 mg/dL. Cardiovascular: Heart size is normal. There is no significant pericardial fluid, thickening or pericardial calcification. Aortic atherosclerosis. No definite coronary artery calcifications. Severe calcifications of the aortic valve. Mediastinum/Nodes: No pathologically enlarged mediastinal  or hilar lymph nodes. Esophagus is unremarkable in appearance. No axillary lymphadenopathy. Lungs/Pleura: Patchy areas of ground-glass attenuation and septal thickening are widespread throughout the lungs bilaterally, most confluent throughout the mid to lower lungs. In addition, there are several patchy nodular appearing areas of probable airspace consolidation scattered throughout the same portions of the lungs, largest of which is in the posterior aspect of the right upper lobe abutting the major fissure (axial image 74 of series 5) measuring 2.5 x 1.1  cm. Scattered areas of mild cylindrical bronchiectasis throughout the lungs bilaterally. No pleural effusions. Upper Abdomen: 5 cm simple cyst in the segment 8 of the liver. Other smaller low-attenuation lesions throughout the liver, several which are too small to definitively characterize, but also favored to represent tiny cysts. Spleen is incompletely imaged but appears a potentially enlarged measuring at least 13.6 cm in length. Multiple nonobstructive calculi are noted within the collecting system of the right kidney measuring up to 5 mm in the upper pole. Aortic atherosclerosis. Musculoskeletal: Chronic compression fractures of T7 and T12, most severe at T7 where there is 50% loss of anterior vertebral body height. There are no aggressive appearing lytic or blastic lesions noted in the visualized portions of the skeleton. There are no aggressive appearing lytic or blastic lesions noted in the visualized portions of the skeleton. IMPRESSION: 1. There is a spectrum of findings in the lungs suggestive of underlying interstitial lung disease, as detailed above. Outpatient referral to Pulmonology is recommended for further evaluation in the near future. Consideration for follow-up high-resolution chest CT is suggested in the next 3-6 months to assess for temporal changes in the appearance of the lung parenchyma. 2. In addition, there are several more focal nodular appearing areas of probable airspace consolidation, which likely reflect residual infection in this patient currently on antibiotic therapy. Attention at time of repeat high-resolution chest CT is recommended to ensure the resolution of these findings. 3. There are calcifications of the aortic valve. Echocardiographic correlation for evaluation of potential valvular dysfunction may be warranted if clinically indicated. 4. Aortic atherosclerosis. 5. Numerous nonobstructive calculi in the upper pole collecting system of the right kidney measuring up to 5  mm. 6. Additional incidental findings, as above. Aortic Atherosclerosis (ICD10-I70.0). Electronically Signed   By: Vinnie Langton M.D.   On: 06/14/2018 08:57   Nm Hepatobiliary Liver Func  Result Date: 06/26/2018 CLINICAL DATA:  Abdominal pain with abnormal CT and ultrasound EXAM: NUCLEAR MEDICINE HEPATOBILIARY IMAGING TECHNIQUE: Sequential images of the abdomen were obtained out to 60 minutes following intravenous administration of radiopharmaceutical. 2.7 mg of intravenous morphine administered after 1 hour and nonvisualization of the gallbladder. Additional images were acquired for 30 minutes. RADIOPHARMACEUTICALS:  5.27 mCi Tc-22m  Choletec IV COMPARISON:  CT and ultrasound 06/25/2018 FINDINGS: Due to equipment malfunction, static frames from initial phase of the HIDA scan were not able to be transferred to PACs. A single static image is available which demonstrates bowel activity but no definite gallbladder activity. Following morphine augmentation, there is filling of the gallbladder. Homogeneous radiotracer activity in the liver. IMPRESSION: 1. Gallbladder visualization following morphine augmentation suggests patency of the cystic duct. Findings on recent imaging studies could be secondary to chronic cholecystitis. 2. Normal biliary to bowel transit. Electronically Signed   By: Donavan Foil M.D.   On: 06/26/2018 22:15   Ct Abdomen Pelvis W Contrast  Result Date: 07/09/2018 CLINICAL DATA:  Status post percutaneous cholecystostomy tube placement 06/27/2018. Peritonitis on exam. Abdominal pain. EXAM: CT  ABDOMEN AND PELVIS WITH CONTRAST TECHNIQUE: Multidetector CT imaging of the abdomen and pelvis was performed using the standard protocol following bolus administration of intravenous contrast. CONTRAST:  16mL ISOVUE-300 IOPAMIDOL (ISOVUE-300) INJECTION 61% COMPARISON:  06/17/2018 CT abdomen/pelvis. FINDINGS: Lower chest: Extensive patchy subpleural reticulation and ground-glass attenuation at both  lung bases, unchanged. Patchy consolidation in the peripheral right middle lobe is similar. New mild patchy consolidation in the lingula. Hepatobiliary: Normal liver size. Simple right liver lobe cysts, largest 4.9 cm. No new liver lesions. Gallbladder is decompressed by indwelling well-positioned percutaneous cholecystostomy tube with the pigtail tip within the gallbladder lumen. Mild diffuse gallbladder wall thickening appears decreased in the interval. No radiopaque cholelithiasis. No biliary ductal dilatation. Pancreas: Normal, with no mass or duct dilation. Spleen: Normal size spleen. Cystic 1.0 cm inferior splenic lesion is stable and considered benign. No new splenic lesions. Adrenals/Urinary Tract: Normal adrenals. Numerous nonobstructing stones in both kidneys, largest 4 mm in the interpolar right kidney and 3 mm in the lower left kidney. No hydronephrosis. No renal mass. Mildly distended bladder with small to moderate anterior left bladder wall diverticulum, unchanged. No acute bladder wall thickening. Stomach/Bowel: Normal non-distended stomach. Normal caliber small bowel with no small bowel wall thickening. Normal appendix. Large amount of stool and gas throughout the large bowel, with no large bowel wall thickening or acute pericolonic fat stranding. Vascular/Lymphatic: Atherosclerotic nonaneurysmal abdominal aorta. Patent portal, splenic, hepatic and renal veins. No pathologically enlarged lymph nodes in the abdomen or pelvis. Reproductive: Mildly enlarged prostate with nonspecific internal prostatic calcifications. Other: No pneumoperitoneum. No focal fluid collection. Trace inferior right perihepatic ascites. Musculoskeletal: No aggressive appearing focal osseous lesions. Marked thoracolumbar spondylosis. IMPRESSION: 1. Large amount of stool and gas throughout the colon, suggesting constipation. No evidence of bowel obstruction or acute bowel inflammation. 2. Percutaneous cholecystostomy tube is well  positioned within the decompressed gallbladder. Gallbladder wall thickening is decreased. No abscess. Trace ascites in the inferior right perihepatic region. 3. Findings are suggestive of a mild multifocal pneumonia superimposed on nonspecific fibrosis at the lung bases. Post treatment high-resolution chest CT suggested in 3-6 months. 4. Mildly enlarged prostate. Stable left anterior bladder wall diverticulum. Nonobstructing bilateral nephrolithiasis. 5.  Aortic Atherosclerosis (ICD10-I70.0). Electronically Signed   By: Ilona Sorrel M.D.   On: 07/09/2018 19:08   Ct Abdomen Pelvis W Contrast  Result Date: 06/25/2018 CLINICAL DATA:  Abdominal pain. Diarrhea. Constipation. RIGHT lower quadrant pain. Dark stool. Neutropenia EXAM: CT ABDOMEN AND PELVIS WITH CONTRAST TECHNIQUE: Multidetector CT imaging of the abdomen and pelvis was performed using the standard protocol following bolus administration of intravenous contrast. CONTRAST:  158mL ISOVUE-300 IOPAMIDOL (ISOVUE-300) INJECTION 61% COMPARISON:  CT 03/11/2017 FINDINGS: Lower chest: Focus of scattered small peripheral focus of consolidation air bronchograms in the peripheral RIGHT middle lobe (image 31/6). Diffuse increase in interlobular septal thickening. Hepatobiliary: Several large hepatic cysts noted. There is new distention of the gallbladder to 5.2 cm. There is pericholecystic fluid along the distended gallbladder. No radiodense gallstones present. There is mild enhancement of the cystic duct (image 40/2). No dilatation of the common bile duct. Pancreas: Normal pancreatic duct. No pancreatic inflammation. Moderate size periampullary duodenum diverticulum noted measuring 3 cm (image 38/2). Spleen: Normal spleen Adrenals/urinary tract: Adrenal glands normal. Several nonobstructing calculi in the RIGHT kidney and LEFT kidney. Ureters bladder normal. No obstructive uropathy. Small diverticulum extending from the LEFT anterior bladder. Stomach/Bowel: Stomach,  small-bowel, cecum normal. Appendix is not clearly identified; however there are no secondary signs of  acute appendicitis. On comparison exam the appendix was just beneath the gallbladder. Ascending, transverse, descending colon appear normal. There is some haziness to the mesentery. There is little intra-abdominal fat. These findings make evaluation of bowel difficult. Vascular/Lymphatic: Abdominal aorta is normal caliber with atherosclerotic calcification. There is no retroperitoneal or periportal lymphadenopathy. No pelvic lymphadenopathy. Reproductive: Prostate normal Other: No free fluid or free air Musculoskeletal: No aggressive osseous lesion. IMPRESSION: 1. Distended gallbladder with pericholecystic fluid is most consistent with acute cholecystitis (calculus or a calculus). No biliary duct dilatation. 2. Appendix is not identified. On the comparison exam, the appendix was just beneath the gallbladder therefore cannot exclude secondary gallbladder inflammation from appendicitis, although less favored. 3. Pancreas appears normal. Small periampullary duodenum diverticulum. 4. Bilateral nonobstructing renal calculi. Electronically Signed   By: Suzy Bouchard M.D.   On: 06/25/2018 13:54   Ir Perc Cholecystostomy  Result Date: 06/27/2018 CLINICAL DATA:  Dilated thick-walled gallbladder, pain. Leukemia with pancytopenia. Poor surgical candidate. Percutaneous drainage requested. EXAM: PERCUTANEOUS CHOLECYSTOSTOMY TUBE PLACEMENT WITH ULTRASOUND AND FLUOROSCOPIC GUIDANCE FLUOROSCOPY TIME:  1.2 minute; 191 uGym2 DAP TECHNIQUE: The procedure, risks (including but not limited to bleeding, infection, organ damage ), benefits, and alternatives were explained to the patient. Questions regarding the procedure were encouraged and answered. The patient understands and consents to the procedure. As antibiotic prophylaxis, Zosyn was ordered pre-procedure and administered intravenously within one hour of incision.Survey  ultrasound of the abdomen was performed and an appropriate skin entry site was identified. Skin site was marked, prepped with chlorhexidine, and draped in usual sterile fashion, and infiltrated locally with 1% lidocaine. Intravenous Fentanyl and Versed were administered as conscious sedation during continuous monitoring of the patient's level of consciousness and physiological / cardiorespiratory status by the radiology RN, with a total moderate sedation time of 12 minutes. Under real-time ultrasound guidance, gallbladder was accessed using a transhepatic approach with a 21-gauge needle. Ultrasound image documentation was saved. Bile returned through the hub. Needle was exchanged over a 018 guidewire for transitional dilator which allowed placement of 035 J wire. Over this, a 10.2 French pigtail catheter was advanced and formed centrally in the gallbladder lumen. 20 mL bilious aspirate were removed, sent for Gram stain and culture. Small contrast injection confirmed appropriate position. Catheter secured externally with 0 Prolene suture and placed external drain bag. Patient tolerated the procedure well. COMPLICATIONS: COMPLICATIONS none IMPRESSION: 1. Technically successful percutaneous cholecystostomy tube placement with ultrasound and fluoroscopic guidance. Electronically Signed   By: Lucrezia Europe M.D.   On: 06/27/2018 16:07   Dg Abd 2 Views  Result Date: 07/11/2018 CLINICAL DATA:  Constipation, abdominal pain. EXAM: ABDOMEN - 2 VIEW COMPARISON:  CT scan of July 09, 2018. FINDINGS: No abnormal bowel dilatation is noted. Large amount of stool seen throughout the colon and rectum. Percutaneous drainage catheter is seen in right side of the abdomen as described on prior CT scan. No free air is noted to suggest pneumoperitoneum. IMPRESSION: No abnormal bowel dilatation is noted.  Large stool burden is noted. Electronically Signed   By: Marijo Conception, M.D.   On: 07/11/2018 11:33   US Abdomen Limited  Ruq  Result Date: 06/25/2018 CLINICAL DATA:  Right upper quadrant pain EXAM: ULTRASOUND ABDOMEN LIMITED RIGHT UPPER QUADRANT COMPARISON:  CT 06/25/2018 FINDINGS: Gallbladder: Small to moderate sludge within the gallbladder, punctate echodense foci within the sludge, questionable for punctate stones. Positive sonographic Murphy. Increased wall thickness at 7.2 mm with vascularity. Common bile duct: Diameter: Slightly enlarged at  7 mm Liver: Septated cyst in the liver measuring 4.8 x 4.9 x 4.4 cm. Additional smaller cyst in the posterior right hepatic lobe measuring 3 x 3.2 x 1.8 cm. Trace perihepatic fluid. Portal vein is patent on color Doppler imaging with normal direction of blood flow towards the liver. IMPRESSION: 1. Dilated gallbladder with moderate sludge, vascular wall thickening, and positive sonographic Murphy sign, findings are consistent with an acute cholecystitis. 2. Slightly enlarged common bile duct at 7 mm, correlate with LFT with follow-up MRCP as indicated 3. Small amount of perihepatic free fluid.  Cysts within the liver Electronically Signed   By: Donavan Foil M.D.   On: 06/25/2018 15:11    Microbiology: No results found for this or any previous visit (from the past 240 hour(s)).   Labs: Basic Metabolic Panel: Recent Labs  Lab 07/09/18 1631 07/10/18 0458 07/11/18 0530 07/12/18 0409  NA 130* 132* 135 138  K 4.6 4.0 4.0 4.0  CL 99 103 104 107  CO2 24 22 21* 23  GLUCOSE 108* 94 93 95  BUN 17 12 9 8   CREATININE 1.07 0.79 0.54* 0.52*  CALCIUM 8.7* 8.1* 8.0* 7.8*  MG  --   --   --  2.0   Liver Function Tests: Recent Labs  Lab 07/09/18 1631 07/12/18 0409  AST 23 21  ALT 30 25  ALKPHOS 45 51  BILITOT 1.1 0.8  PROT 7.1 6.6  ALBUMIN 3.3* 2.7*   Recent Labs  Lab 07/09/18 1631  LIPASE 61*   No results for input(s): AMMONIA in the last 168 hours. CBC: Recent Labs  Lab 07/06/18 1033 07/09/18 1631 07/10/18 0458 07/11/18 0530  WBC 2.5* 3.0* 2.7* 3.3*   NEUTROABS 0.4* 1.5*  --  1.2*  HGB 8.3* 6.6* 7.4* 7.3*  HCT 25.1* 20.9* 23.4* 23.0*  MCV 89.6 91.7 91.4 92.0  PLT 48* 43* 41* 44*   Cardiac Enzymes: No results for input(s): CKTOTAL, CKMB, CKMBINDEX, TROPONINI in the last 168 hours. BNP: BNP (last 3 results) No results for input(s): BNP in the last 8760 hours.  ProBNP (last 3 results) No results for input(s): PROBNP in the last 8760 hours.  CBG: No results for input(s): GLUCAP in the last 168 hours.     Signed:  Nita Sells MD   [facility] 07/12/2018, 9:40 AM

## 2018-07-20 ENCOUNTER — Inpatient Hospital Stay: Payer: Medicare Other

## 2018-07-20 ENCOUNTER — Other Ambulatory Visit: Payer: Self-pay | Admitting: *Deleted

## 2018-07-20 DIAGNOSIS — D649 Anemia, unspecified: Secondary | ICD-10-CM

## 2018-07-20 DIAGNOSIS — D4620 Refractory anemia with excess of blasts, unspecified: Secondary | ICD-10-CM | POA: Diagnosis present

## 2018-07-20 LAB — CBC WITH DIFFERENTIAL (CANCER CENTER ONLY)
Abs Immature Granulocytes: 0.4 10*3/uL — ABNORMAL HIGH (ref 0.00–0.07)
BASOS PCT: 0 %
Basophils Absolute: 0 10*3/uL (ref 0.0–0.1)
EOS ABS: 0 10*3/uL (ref 0.0–0.5)
EOS PCT: 0 %
HEMATOCRIT: 20.6 % — AB (ref 39.0–52.0)
Hemoglobin: 6.5 g/dL — CL (ref 13.0–17.0)
Immature Granulocytes: 13 %
Lymphocytes Relative: 24 %
Lymphs Abs: 0.8 10*3/uL (ref 0.7–4.0)
MCH: 28.9 pg (ref 26.0–34.0)
MCHC: 31.6 g/dL (ref 30.0–36.0)
MCV: 91.6 fL (ref 80.0–100.0)
MONOS PCT: 17 %
Monocytes Absolute: 0.5 10*3/uL (ref 0.1–1.0)
NEUTROS PCT: 46 %
Neutro Abs: 1.4 10*3/uL — ABNORMAL LOW (ref 1.7–7.7)
Platelet Count: 75 10*3/uL — ABNORMAL LOW (ref 150–400)
RBC: 2.25 MIL/uL — ABNORMAL LOW (ref 4.22–5.81)
RDW: 15.8 % — AB (ref 11.5–15.5)
WBC Count: 3.1 10*3/uL — ABNORMAL LOW (ref 4.0–10.5)
nRBC: 0 % (ref 0.0–0.2)

## 2018-07-20 LAB — SAMPLE TO BLOOD BANK

## 2018-07-20 LAB — PREPARE RBC (CROSSMATCH)

## 2018-07-20 MED ORDER — SODIUM CHLORIDE 0.9% IV SOLUTION
250.0000 mL | Freq: Once | INTRAVENOUS | Status: AC
  Administered 2018-07-20: 250 mL via INTRAVENOUS
  Filled 2018-07-20: qty 250

## 2018-07-20 MED ORDER — DIPHENHYDRAMINE HCL 25 MG PO CAPS
ORAL_CAPSULE | ORAL | Status: AC
  Filled 2018-07-20: qty 1

## 2018-07-20 MED ORDER — ACETAMINOPHEN 325 MG PO TABS
ORAL_TABLET | ORAL | Status: AC
  Filled 2018-07-20: qty 2

## 2018-07-20 MED ORDER — DIPHENHYDRAMINE HCL 25 MG PO CAPS
25.0000 mg | ORAL_CAPSULE | Freq: Once | ORAL | Status: AC
  Administered 2018-07-20: 25 mg via ORAL

## 2018-07-20 MED ORDER — ACETAMINOPHEN 325 MG PO TABS
650.0000 mg | ORAL_TABLET | Freq: Once | ORAL | Status: AC
  Administered 2018-07-20: 650 mg via ORAL

## 2018-07-20 NOTE — Patient Instructions (Signed)

## 2018-07-21 LAB — BPAM RBC
BLOOD PRODUCT EXPIRATION DATE: 201911222359
Blood Product Expiration Date: 201911212359
ISSUE DATE / TIME: 201910231240
ISSUE DATE / TIME: 201910231240
UNIT TYPE AND RH: 6200
Unit Type and Rh: 6200

## 2018-07-21 LAB — TYPE AND SCREEN
ABO/RH(D): A POS
Antibody Screen: NEGATIVE
UNIT DIVISION: 0
Unit division: 0

## 2018-07-26 ENCOUNTER — Encounter: Payer: Self-pay | Admitting: Radiology

## 2018-07-26 ENCOUNTER — Other Ambulatory Visit: Payer: Self-pay | Admitting: General Surgery

## 2018-07-26 ENCOUNTER — Ambulatory Visit
Admission: RE | Admit: 2018-07-26 | Discharge: 2018-07-26 | Disposition: A | Discharge status: Home / Self Care | Payer: Medicare Other | Source: Ambulatory Visit | Attending: General Surgery | Admitting: General Surgery

## 2018-07-26 DIAGNOSIS — K810 Acute cholecystitis: Secondary | ICD-10-CM

## 2018-07-26 HISTORY — PX: IR RADIOLOGIST EVAL & MGMT: IMG5224

## 2018-07-26 NOTE — Progress Notes (Signed)
Chief Complaint: F/U perc chole  Referring Physician(s): Hoxworth,Benjamin  Supervising Physician: Jacqulynn Cadet  History of Present Illness: Dean Stanley is a 84 y.o. male who is here today for injection of cholecystostomy.  He was admitted to [facility] on 06/25/18 with persistent abdominal pain and nausea.    CT scan of abdomen pelvis on 9/28 revealed distended gallbladder with pericholecystic fluid concerning for acute cholecystitis.    Ultrasound abdomen revealed dilated gallbladder with moderate sludge,  wall thickening.  Nuclear medicine hepatobiliary scan yesterday revealed gallbladder visualization suggesting patency of the cystic duct.   He was deemed a poor surgical candidate due to his leukemia.  He underwent image guided percutaneous cholecystostomy by Dr. Vernard Gambles on 06/27/18.  He feels good and is doing much better.  He only c/o something palpable under his skin which I explained was part of the tubing.  Past Medical History:  Diagnosis Date  . Anemia   . Cancer (HCC)    myelodysplastic syndrome  . Complication of anesthesia    Reaction to anesthesia " things went haywire around me"  . Do not resuscitate 11/04/2015  . Hearing impairment    wears hearing left ear  . History of ITP 1960  . Hypothyroidism   . Meniere's syndrome 1980s  . Port catheter in place 12/23/2015    Past Surgical History:  Procedure Laterality Date  . cataract Right 2011  . CIRCUMCISION    . COLONOSCOPY    . cortisone injections to back x 6    . IR PERC CHOLECYSTOSTOMY  06/27/2018  . ORIF ORBITAL FRACTURE Left 2000   2 fractures orbital platform    Allergies: Percocet [oxycodone-acetaminophen]; Hydrocodone; and Oxycodone  Medications: Prior to Admission medications   Medication Sig Start Date End Date Taking? Authorizing Provider  calcipotriene (DOVONOX) 0.005 % ointment Apply topically 2 (two) times daily as needed (inflammation).     [provider]  Darbepoetin Alfa (ARANESP) 300 MCG/0.6ML SOSY injection Inject 300 mcg into the skin every 30 (thirty) days. As needed based on blood counts    [provider]  desmopressin (DDAVP) 0.2 MG tablet Take 0.2 mg by mouth daily.    [provider]  finasteride (PROPECIA) 1 MG tablet Take 1 mg by mouth daily with breakfast.     [provider]  Flaxseed, Linseed, (FLAX PO) Take 1 tablet by mouth daily.    [provider]  levothyroxine (SYNTHROID, LEVOTHROID) 175 MCG tablet Take 175 mcg by mouth at bedtime.     [provider]  Lutein 40 MG CAPS Take 40 mg by mouth daily with breakfast.  10/09/15   [provider]  magic mouthwash SOLN Take 2 mLs by mouth 3 (three) times daily as needed for mouth pain. 07/12/18   Nita Sells, MD  Multiple Vitamins-Minerals (CENTRUM ADULTS PO) Take 1 tablet by mouth daily.     [provider]  polyethylene glycol (MIRALAX / GLYCOLAX) packet Take 17 g by mouth daily. 07/01/18   Mariel Aloe, MD  senna-docusate (SENOKOT-S) 8.6-50 MG tablet Take 2 tablets by mouth 2 (two) times daily. 06/30/18   Mariel Aloe, MD  sorbitol 70 % SOLN Take 30 mLs by mouth 2 (two) times daily. 07/12/18   Nita Sells, MD  traMADol (ULTRAM) 50 MG tablet Take 50 mg by mouth every 6 (six) hours as needed for moderate pain.     [provider]  triamcinolone (KENALOG) 0.025 % cream Apply  1 application topically 2 (two) times daily as needed (for inflammation).     [provider]     Family History  Problem Relation Age of Onset  . Throat cancer Other   . Pancreatic cancer Father   . Colon cancer Brother     Social History   Socioeconomic History  . Marital status: Married    Spouse name: Not on file  . Number of children: Not on file  . Years of education: Not on file  . Highest education level: Not on file  Occupational History  . Not on file  Social Needs  .  Financial resource strain: Not on file  . Food insecurity:    Worry: Not on file    Inability: Not on file  . Transportation needs:    Medical: Not on file    Non-medical: Not on file  Tobacco Use  . Smoking status: Never Smoker  . Smokeless tobacco: Never Used  Substance and Sexual Activity  . Alcohol use: Yes    Comment: Occasional  . Drug use: No  . Sexual activity: Not Currently  Lifestyle  . Physical activity:    Days per week: Not on file    Minutes per session: Not on file  . Stress: Not on file  Relationships  . Social connections:    Talks on phone: Not on file    Gets together: Not on file    Attends religious service: Not on file    Active member of club or organization: Not on file    Attends meetings of clubs or organizations: Not on file    Relationship status: Not on file  Other Topics Concern  . Not on file  Social History Narrative  . Not on file    Review of Systems  Vital Signs: There were no vitals taken for this visit.  Physical Exam  Constitutional: He is oriented to person, place, and time.  Thin, elderly, frail  HENT:  Head: Normocephalic and atraumatic.  Pulmonary/Chest: Effort normal.  Abdominal: Soft.    Perc chole drain in place  Musculoskeletal: Normal range of motion.  Neurological: He is alert and oriented to person, place, and time.  Skin: Skin is warm and dry.  Psychiatric: He has a normal mood and affect. His behavior is normal. Judgment and thought content normal.     Imaging: Nm Hepatobiliary Liver Func  Result Date: 06/26/2018 CLINICAL DATA:  Abdominal pain with abnormal CT and ultrasound EXAM: NUCLEAR MEDICINE HEPATOBILIARY IMAGING TECHNIQUE: Sequential images of the abdomen were obtained out to 60 minutes following intravenous administration of radiopharmaceutical. 2.7 mg of intravenous morphine administered after 1 hour and nonvisualization of the gallbladder. Additional images were acquired for 30 minutes.  RADIOPHARMACEUTICALS:  5.27 mCi Tc-72m  Choletec IV COMPARISON:  CT and ultrasound 06/25/2018 FINDINGS: Due to equipment malfunction, static frames from initial phase of the HIDA scan were not able to be transferred to PACs. A single static image is available which demonstrates bowel activity but no definite gallbladder activity. Following morphine augmentation, there is filling of the gallbladder. Homogeneous radiotracer activity in the liver. IMPRESSION: 1. Gallbladder visualization following morphine augmentation suggests patency of the cystic duct. Findings on recent imaging studies could be secondary to chronic cholecystitis. 2. Normal biliary to bowel transit. Electronically Signed   By: Donavan Foil M.D.   On: 06/26/2018 22:15   Ct Abdomen Pelvis W Contrast  Result Date: 07/09/2018 CLINICAL DATA:  Status post percutaneous cholecystostomy tube placement 06/27/2018.  Peritonitis on exam. Abdominal pain. EXAM: CT ABDOMEN AND PELVIS WITH CONTRAST TECHNIQUE: Multidetector CT imaging of the abdomen and pelvis was performed using the standard protocol following bolus administration of intravenous contrast. CONTRAST:  174mL ISOVUE-300 IOPAMIDOL (ISOVUE-300) INJECTION 61% COMPARISON:  06/17/2018 CT abdomen/pelvis. FINDINGS: Lower chest: Extensive patchy subpleural reticulation and ground-glass attenuation at both lung bases, unchanged. Patchy consolidation in the peripheral right middle lobe is similar. New mild patchy consolidation in the lingula. Hepatobiliary: Normal liver size. Simple right liver lobe cysts, largest 4.9 cm. No new liver lesions. Gallbladder is decompressed by indwelling well-positioned percutaneous cholecystostomy tube with the pigtail tip within the gallbladder lumen. Mild diffuse gallbladder wall thickening appears decreased in the interval. No radiopaque cholelithiasis. No biliary ductal dilatation. Pancreas: Normal, with no mass or duct dilation. Spleen: Normal size spleen. Cystic 1.0 cm  inferior splenic lesion is stable and considered benign. No new splenic lesions. Adrenals/Urinary Tract: Normal adrenals. Numerous nonobstructing stones in both kidneys, largest 4 mm in the interpolar right kidney and 3 mm in the lower left kidney. No hydronephrosis. No renal mass. Mildly distended bladder with small to moderate anterior left bladder wall diverticulum, unchanged. No acute bladder wall thickening. Stomach/Bowel: Normal non-distended stomach. Normal caliber small bowel with no small bowel wall thickening. Normal appendix. Large amount of stool and gas throughout the large bowel, with no large bowel wall thickening or acute pericolonic fat stranding. Vascular/Lymphatic: Atherosclerotic nonaneurysmal abdominal aorta. Patent portal, splenic, hepatic and renal veins. No pathologically enlarged lymph nodes in the abdomen or pelvis. Reproductive: Mildly enlarged prostate with nonspecific internal prostatic calcifications. Other: No pneumoperitoneum. No focal fluid collection. Trace inferior right perihepatic ascites. Musculoskeletal: No aggressive appearing focal osseous lesions. Marked thoracolumbar spondylosis. IMPRESSION: 1. Large amount of stool and gas throughout the colon, suggesting constipation. No evidence of bowel obstruction or acute bowel inflammation. 2. Percutaneous cholecystostomy tube is well positioned within the decompressed gallbladder. Gallbladder wall thickening is decreased. No abscess. Trace ascites in the inferior right perihepatic region. 3. Findings are suggestive of a mild multifocal pneumonia superimposed on nonspecific fibrosis at the lung bases. Post treatment high-resolution chest CT suggested in 3-6 months. 4. Mildly enlarged prostate. Stable left anterior bladder wall diverticulum. Nonobstructing bilateral nephrolithiasis. 5.  Aortic Atherosclerosis (ICD10-I70.0). Electronically Signed   By: Ilona Sorrel M.D.   On: 07/09/2018 19:08   Ir Perc Cholecystostomy  Result Date:  06/27/2018 CLINICAL DATA:  Dilated thick-walled gallbladder, pain. Leukemia with pancytopenia. Poor surgical candidate. Percutaneous drainage requested. EXAM: PERCUTANEOUS CHOLECYSTOSTOMY TUBE PLACEMENT WITH ULTRASOUND AND FLUOROSCOPIC GUIDANCE FLUOROSCOPY TIME:  1.2 minute; 191 uGym2 DAP TECHNIQUE: The procedure, risks (including but not limited to bleeding, infection, organ damage ), benefits, and alternatives were explained to the patient. Questions regarding the procedure were encouraged and answered. The patient understands and consents to the procedure. As antibiotic prophylaxis, Zosyn was ordered pre-procedure and administered intravenously within one hour of incision.Survey ultrasound of the abdomen was performed and an appropriate skin entry site was identified. Skin site was marked, prepped with chlorhexidine, and draped in usual sterile fashion, and infiltrated locally with 1% lidocaine. Intravenous Fentanyl and Versed were administered as conscious sedation during continuous monitoring of the patient's level of consciousness and physiological / cardiorespiratory status by the radiology RN, with a total moderate sedation time of 12 minutes. Under real-time ultrasound guidance, gallbladder was accessed using a transhepatic approach with a 21-gauge needle. Ultrasound image documentation was saved. Bile returned through the hub. Needle was exchanged over a 018 guidewire for  transitional dilator which allowed placement of 035 J wire. Over this, a 10.2 French pigtail catheter was advanced and formed centrally in the gallbladder lumen. 20 mL bilious aspirate were removed, sent for Gram stain and culture. Small contrast injection confirmed appropriate position. Catheter secured externally with 0 Prolene suture and placed external drain bag. Patient tolerated the procedure well. COMPLICATIONS: COMPLICATIONS none IMPRESSION: 1. Technically successful percutaneous cholecystostomy tube placement with ultrasound and  fluoroscopic guidance. Electronically Signed   By: Lucrezia Europe M.D.   On: 06/27/2018 16:07   Dg Abd 2 Views  Result Date: 07/11/2018 CLINICAL DATA:  Constipation, abdominal pain. EXAM: ABDOMEN - 2 VIEW COMPARISON:  CT scan of July 09, 2018. FINDINGS: No abnormal bowel dilatation is noted. Large amount of stool seen throughout the colon and rectum. Percutaneous drainage catheter is seen in right side of the abdomen as described on prior CT scan. No free air is noted to suggest pneumoperitoneum. IMPRESSION: No abnormal bowel dilatation is noted.  Large stool burden is noted. Electronically Signed   By: Marijo Conception, M.D.   On: 07/11/2018 11:33    Labs:  CBC: Recent Labs    07/09/18 1631 07/10/18 0458 07/11/18 0530 07/20/18 1039  WBC 3.0* 2.7* 3.3* 3.1*  HGB 6.6* 7.4* 7.3* 6.5*  HCT 20.9* 23.4* 23.0* 20.6*  PLT 43* 41* 44* 75*    COAGS: Recent Labs    06/26/18 0351 06/27/18 0356  INR 1.49 1.52  APTT 44*  --     BMP: Recent Labs    07/09/18 1631 07/10/18 0458 07/11/18 0530 07/12/18 0409  NA 130* 132* 135 138  K 4.6 4.0 4.0 4.0  CL 99 103 104 107  CO2 24 22 21* 23  GLUCOSE 108* 94 93 95  BUN 17 12 9 8   CALCIUM 8.7* 8.1* 8.0* 7.8*  CREATININE 1.07 0.79 0.54* 0.52*  GFRNONAA >60 >60 >60 >60  GFRAA >60 >60 >60 >60    LIVER FUNCTION TESTS: Recent Labs    06/26/18 0351 06/27/18 0356 07/09/18 1631 07/12/18 0409  BILITOT 1.0 1.2 1.1 0.8  AST 14* 17 23 21   ALT 14 15 30 25   ALKPHOS 47 47 45 51  PROT 6.1* 6.2* 7.1 6.6  ALBUMIN 2.6* 2.6* 3.3* 2.7*    TUMOR MARKERS: No results for input(s): AFPTM, CEA, CA199, CHROMGRNA in the last 8760 hours.  Assessment:  Acute acalculous cholecystitis  Poor surgical candidate due to leukemia  S/P Perc Chole 06/27/18.  Injection showed patency of the cystic duct today.  Dr. Laurence Ferrari reviewed images.  Will cap drain today. Bag given in case he becomes symptomatic again.  Return in 2 weeks for repeat injection and  possible removal of the tube.   Electronically Signed: Murrell Redden PA-C 07/26/2018, 2:28 PM    Please refer to Dr. Katrinka Blazing attestation of this note for management and plan.

## 2018-07-27 ENCOUNTER — Other Ambulatory Visit: Payer: Medicare Other

## 2018-08-02 ENCOUNTER — Encounter: Payer: Self-pay | Admitting: Internal Medicine

## 2018-08-02 ENCOUNTER — Other Ambulatory Visit: Payer: Self-pay | Admitting: *Deleted

## 2018-08-02 ENCOUNTER — Inpatient Hospital Stay: Payer: Medicare Other

## 2018-08-02 ENCOUNTER — Inpatient Hospital Stay (HOSPITAL_BASED_OUTPATIENT_CLINIC_OR_DEPARTMENT_OTHER): Payer: Medicare Other | Admitting: Internal Medicine

## 2018-08-02 ENCOUNTER — Ambulatory Visit: Payer: Medicare Other

## 2018-08-02 ENCOUNTER — Inpatient Hospital Stay: Payer: Medicare Other | Attending: Internal Medicine

## 2018-08-02 VITALS — BP 103/55 | HR 96 | Temp 98.1°F | Resp 17 | Ht 68.0 in | Wt 135.3 lb

## 2018-08-02 DIAGNOSIS — D708 Other neutropenia: Secondary | ICD-10-CM

## 2018-08-02 DIAGNOSIS — D649 Anemia, unspecified: Secondary | ICD-10-CM

## 2018-08-02 DIAGNOSIS — R0602 Shortness of breath: Secondary | ICD-10-CM | POA: Diagnosis not present

## 2018-08-02 DIAGNOSIS — D4620 Refractory anemia with excess of blasts, unspecified: Secondary | ICD-10-CM

## 2018-08-02 DIAGNOSIS — Z862 Personal history of diseases of the blood and blood-forming organs and certain disorders involving the immune mechanism: Secondary | ICD-10-CM | POA: Insufficient documentation

## 2018-08-02 DIAGNOSIS — R531 Weakness: Secondary | ICD-10-CM | POA: Insufficient documentation

## 2018-08-02 DIAGNOSIS — Z9221 Personal history of antineoplastic chemotherapy: Secondary | ICD-10-CM | POA: Insufficient documentation

## 2018-08-02 DIAGNOSIS — R5383 Other fatigue: Secondary | ICD-10-CM

## 2018-08-02 DIAGNOSIS — K819 Cholecystitis, unspecified: Secondary | ICD-10-CM

## 2018-08-02 DIAGNOSIS — Z79899 Other long term (current) drug therapy: Secondary | ICD-10-CM | POA: Insufficient documentation

## 2018-08-02 DIAGNOSIS — D469 Myelodysplastic syndrome, unspecified: Secondary | ICD-10-CM

## 2018-08-02 LAB — CBC WITH DIFFERENTIAL (CANCER CENTER ONLY)
BASOS ABS: 0 10*3/uL (ref 0.0–0.1)
Band Neutrophils: 8 %
Basophils Relative: 2 %
Blasts: 1 %
EOS ABS: 0 10*3/uL (ref 0.0–0.5)
Eosinophils Relative: 0 %
HCT: 22.2 % — ABNORMAL LOW (ref 39.0–52.0)
Hemoglobin: 6.8 g/dL — CL (ref 13.0–17.0)
LYMPHS ABS: 1.1 10*3/uL (ref 0.7–4.0)
LYMPHS PCT: 56 %
MCH: 28.3 pg (ref 26.0–34.0)
MCHC: 30.6 g/dL (ref 30.0–36.0)
MCV: 92.5 fL (ref 80.0–100.0)
MYELOCYTES: 1 %
Metamyelocytes Relative: 2 %
Monocytes Absolute: 0.2 10*3/uL (ref 0.1–1.0)
Monocytes Relative: 12 %
NEUTROS ABS: 0.4 10*3/uL — AB (ref 1.7–17.7)
NEUTROS PCT: 10 %
Other: 8 %
Platelet Count: 87 10*3/uL — ABNORMAL LOW (ref 150–400)
RBC: 2.4 MIL/uL — ABNORMAL LOW (ref 4.22–5.81)
RDW: 15 % (ref 11.5–15.5)
WBC Count: 2 10*3/uL — ABNORMAL LOW (ref 4.0–10.5)
nRBC: 0 % (ref 0.0–0.2)

## 2018-08-02 LAB — SAMPLE TO BLOOD BANK

## 2018-08-02 LAB — IRON AND TIBC
Iron: 96 ug/dL (ref 42–163)
SATURATION RATIOS: 107 % — AB (ref 20–55)
TIBC: 90 ug/dL — AB (ref 202–409)
UIBC: UNDETERMINED ug/dL (ref 117–376)

## 2018-08-02 MED ORDER — DARBEPOETIN ALFA 300 MCG/0.6ML IJ SOSY
PREFILLED_SYRINGE | INTRAMUSCULAR | Status: AC
  Filled 2018-08-02: qty 0.6

## 2018-08-02 NOTE — Addendum Note (Signed)
Addended by: Lucile Crater on: 08/02/2018 10:31 AM   Modules accepted: Orders

## 2018-08-02 NOTE — Progress Notes (Signed)
PER MD- pt to have 2 units RBC transfusion on 11/6. Pt to have CBC and transfusion appt for possible 2 units blood every other week.  Message to scheduling .  Orders entered. Notified lab to send specimen to Blood bank.

## 2018-08-02 NOTE — Progress Notes (Signed)
[place] [phone]   [phone]  OFFICE PROGRESS NOTE  Lorene Dy, MD [address], [place] [address] [place] [address]  DIAGNOSIS: 1)  myelodysplastic syndrome consistent with refractory anemia with excess blast diagnosed in December 2016 2)  History of ITP  PRIOR THERAPY: Vidaza 75 mg/m2 IV for 5 days every 4 weeks. Status post 3 cycles.  CURRENT THERAPY:  1) Supportive care with Aranesp 300 g subcutaneously every 3 weeks in addition to Granix 300 mcg subcutaneously for neutropenia as needed. 2) PRBCs transfusion requirement now at regular basis.  INTERVAL HISTORY: Dean Stanley 84 y.o. male returns to the clinic today for follow-up visit accompanied by his wife.  The patient continues to complain of significant fatigue and weakness.  He was also recently diagnosed with cholecystitis and managed medically.  He has a biliary drainage.  He denied having any chest pain but has shortness of breath at baseline increased with exertion.  He has no fever or chills.  He denied having any headache or visual changes.  He is here today for evaluation and repeat blood work.  MEDICAL HISTORY: Past Medical History:  Diagnosis Date  . Anemia   . Cancer (HCC)    myelodysplastic syndrome  . Complication of anesthesia    Reaction to anesthesia " things went haywire around me"  . Do not resuscitate 11/04/2015  . Hearing impairment    wears hearing left ear  . History of ITP 1960  . Hypothyroidism   . Meniere's syndrome 1980s  . Port catheter in place 12/23/2015    ALLERGIES:  is allergic to percocet [oxycodone-acetaminophen]; hydrocodone; and oxycodone.  MEDICATIONS:  Current Outpatient Medications  Medication Sig Dispense Refill  . calcipotriene (DOVONOX) 0.005 % ointment Apply topically 2 (two) times daily as needed (inflammation).     . Darbepoetin Alfa (ARANESP) 300 MCG/0.6ML SOSY injection Inject 300 mcg into the skin every 30 (thirty)  days. As needed based on blood counts    . finasteride (PROPECIA) 1 MG tablet Take 1 mg by mouth daily with breakfast.     . Flaxseed, Linseed, (FLAX PO) Take 1 tablet by mouth daily.    Marland Kitchen levothyroxine (SYNTHROID, LEVOTHROID) 175 MCG tablet Take 175 mcg by mouth at bedtime.     . Lutein 40 MG CAPS Take 40 mg by mouth daily with breakfast.     . magic mouthwash SOLN Take 2 mLs by mouth 3 (three) times daily as needed for mouth pain. 30 mL 0  . Multiple Vitamins-Minerals (CENTRUM ADULTS PO) Take 1 tablet by mouth daily.     . polyethylene glycol (MIRALAX / GLYCOLAX) packet Take 17 g by mouth daily. 14 each 0  . senna-docusate (SENOKOT-S) 8.6-50 MG tablet Take 2 tablets by mouth 2 (two) times daily. 20 tablet 0  . sorbitol 70 % SOLN Take 30 mLs by mouth 2 (two) times daily. 300 mL 0  . traMADol (ULTRAM) 50 MG tablet Take 50 mg by mouth every 6 (six) hours as needed for moderate pain.     Marland Kitchen triamcinolone (KENALOG) 0.025 % cream Apply 1 application topically 2 (two) times daily as needed (for inflammation).      No current facility-administered medications for this visit.    Facility-Administered Medications Ordered in Other Visits  Medication Dose Route Frequency Provider Last Rate Last Dose  . Darbepoetin Alfa (ARANESP) injection 300 mcg  300 mcg Subcutaneous Once Curt Bears, MD  SURGICAL HISTORY:  Past Surgical History:  Procedure Laterality Date  . cataract Right 2011  . CIRCUMCISION    . COLONOSCOPY    . cortisone injections to back x 6    . IR PERC CHOLECYSTOSTOMY  06/27/2018  . IR RADIOLOGIST EVAL & MGMT  07/26/2018  . ORIF ORBITAL FRACTURE Left 2000   2 fractures orbital platform    REVIEW OF SYSTEMS:  A comprehensive review of systems was negative except for: Constitutional: positive for fatigue and weight loss Respiratory: positive for dyspnea on exertion Musculoskeletal: positive for muscle weakness   PHYSICAL EXAMINATION: General appearance: alert, cooperative,  fatigued and no distress Head: Normocephalic, without obvious abnormality, atraumatic Neck: no adenopathy, no JVD, supple, symmetrical, trachea midline and thyroid not enlarged, symmetric, no tenderness/mass/nodules Lymph nodes: Cervical, supraclavicular, and axillary nodes normal. Resp: clear to auscultation bilaterally Back: symmetric, no curvature. ROM normal. No CVA tenderness. Cardio: regular rate and rhythm, S1, S2 normal, no murmur, click, rub or gallop GI: soft, non-tender; bowel sounds normal; no masses,  no organomegaly Extremities: extremities normal, atraumatic, no cyanosis or edema  ECOG PERFORMANCE STATUS: 1 - Symptomatic but completely ambulatory  Blood pressure (!) 103/55, pulse 96, temperature 98.1 F (36.7 C), temperature source Oral, resp. rate 17, height 5\' 8"  (1.727 m), weight 135 lb 4.8 oz (61.4 kg), SpO2 100 %.  LABORATORY DATA: Lab Results  Component Value Date   WBC 2.0 (L) 08/02/2018   HGB 6.8 (LL) 08/02/2018   HCT 22.2 (L) 08/02/2018   MCV 92.5 08/02/2018   PLT 87 (L) 08/02/2018      Chemistry      Component Value Date/Time   NA 138 07/12/2018 0409   NA 134 (L) 09/29/2017 1134   K 4.0 07/12/2018 0409   K 4.1 09/29/2017 1134   CL 107 07/12/2018 0409   CO2 23 07/12/2018 0409   CO2 26 09/29/2017 1134   BUN 8 07/12/2018 0409   BUN 14.8 09/29/2017 1134   CREATININE 0.52 (L) 07/12/2018 0409   CREATININE 0.85 01/24/2018 0953   CREATININE 0.8 09/29/2017 1134      Component Value Date/Time   CALCIUM 7.8 (L) 07/12/2018 0409   CALCIUM 8.8 09/29/2017 1134   ALKPHOS 51 07/12/2018 0409   ALKPHOS 41 09/29/2017 1134   AST 21 07/12/2018 0409   AST 18 01/24/2018 0953   AST 13 09/29/2017 1134   ALT 25 07/12/2018 0409   ALT 12 01/24/2018 0953   ALT 12 09/29/2017 1134   BILITOT 0.8 07/12/2018 0409   BILITOT 0.8 01/24/2018 0953   BILITOT 1.37 (H) 09/29/2017 1134       RADIOGRAPHIC STUDIES: Ct Abdomen Pelvis W Contrast  Result Date:  07/09/2018 CLINICAL DATA:  Status post percutaneous cholecystostomy tube placement 06/27/2018. Peritonitis on exam. Abdominal pain. EXAM: CT ABDOMEN AND PELVIS WITH CONTRAST TECHNIQUE: Multidetector CT imaging of the abdomen and pelvis was performed using the standard protocol following bolus administration of intravenous contrast. CONTRAST:  157mL ISOVUE-300 IOPAMIDOL (ISOVUE-300) INJECTION 61% COMPARISON:  06/17/2018 CT abdomen/pelvis. FINDINGS: Lower chest: Extensive patchy subpleural reticulation and ground-glass attenuation at both lung bases, unchanged. Patchy consolidation in the peripheral right middle lobe is similar. New mild patchy consolidation in the lingula. Hepatobiliary: Normal liver size. Simple right liver lobe cysts, largest 4.9 cm. No new liver lesions. Gallbladder is decompressed by indwelling well-positioned percutaneous cholecystostomy tube with the pigtail tip within the gallbladder lumen. Mild diffuse gallbladder wall thickening appears decreased in the interval. No radiopaque cholelithiasis. No biliary ductal  dilatation. Pancreas: Normal, with no mass or duct dilation. Spleen: Normal size spleen. Cystic 1.0 cm inferior splenic lesion is stable and considered benign. No new splenic lesions. Adrenals/Urinary Tract: Normal adrenals. Numerous nonobstructing stones in both kidneys, largest 4 mm in the interpolar right kidney and 3 mm in the lower left kidney. No hydronephrosis. No renal mass. Mildly distended bladder with small to moderate anterior left bladder wall diverticulum, unchanged. No acute bladder wall thickening. Stomach/Bowel: Normal non-distended stomach. Normal caliber small bowel with no small bowel wall thickening. Normal appendix. Large amount of stool and gas throughout the large bowel, with no large bowel wall thickening or acute pericolonic fat stranding. Vascular/Lymphatic: Atherosclerotic nonaneurysmal abdominal aorta. Patent portal, splenic, hepatic and renal veins. No  pathologically enlarged lymph nodes in the abdomen or pelvis. Reproductive: Mildly enlarged prostate with nonspecific internal prostatic calcifications. Other: No pneumoperitoneum. No focal fluid collection. Trace inferior right perihepatic ascites. Musculoskeletal: No aggressive appearing focal osseous lesions. Marked thoracolumbar spondylosis. IMPRESSION: 1. Large amount of stool and gas throughout the colon, suggesting constipation. No evidence of bowel obstruction or acute bowel inflammation. 2. Percutaneous cholecystostomy tube is well positioned within the decompressed gallbladder. Gallbladder wall thickening is decreased. No abscess. Trace ascites in the inferior right perihepatic region. 3. Findings are suggestive of a mild multifocal pneumonia superimposed on nonspecific fibrosis at the lung bases. Post treatment high-resolution chest CT suggested in 3-6 months. 4. Mildly enlarged prostate. Stable left anterior bladder wall diverticulum. Nonobstructing bilateral nephrolithiasis. 5.  Aortic Atherosclerosis (ICD10-I70.0). Electronically Signed   By: Ilona Sorrel M.D.   On: 07/09/2018 19:08   Dg Abd 2 Views  Result Date: 07/11/2018 CLINICAL DATA:  Constipation, abdominal pain. EXAM: ABDOMEN - 2 VIEW COMPARISON:  CT scan of July 09, 2018. FINDINGS: No abnormal bowel dilatation is noted. Large amount of stool seen throughout the colon and rectum. Percutaneous drainage catheter is seen in right side of the abdomen as described on prior CT scan. No free air is noted to suggest pneumoperitoneum. IMPRESSION: No abnormal bowel dilatation is noted.  Large stool burden is noted. Electronically Signed   By: Marijo Conception, M.D.   On: 07/11/2018 11:33   Dg Cholangiogram  Existing Tube  Result Date: 07/26/2018 INDICATION: 84 year old male with a history of acute cholecystitis with sludge present but no stones. He was deemed a poor surgical candidate secondary to a history of myelodysplastic syndrome and was  therefore treated by placement of a percutaneous cholecystostomy tube on 06/27/2018. He presents today for cholangiogram to assess patency of the biliary tree. EXAM: Cholangiogram through existing tube MEDICATIONS: None ANESTHESIA/SEDATION: None FLUOROSCOPY TIME:  Fluoroscopy Time: 0 minutes 48 seconds (12 mGy). COMPLICATIONS: None immediate. PROCEDURE: Informed written consent was obtained from the patient after a thorough discussion of the procedural risks, benefits and alternatives. All questions were addressed. A timeout was performed prior to the initiation of the procedure. A cholangiogram was performed through the existing tube. The gallbladder is contracted. The cystic duct and common bile duct are widely patent. Contrast material passes through the ampulla and into the duodenum. IMPRESSION: Restored patency of the cystic and common bile ducts. Electronically Signed   By: Jacqulynn Cadet M.D.   On: 07/26/2018 14:56   Ir Radiologist Eval & Mgmt  Result Date: 07/26/2018 Please refer to notes tab for details about interventional procedure. (Op Note)   ASSESSMENT AND PLAN:   This is a very pleasant 84 years old white male with myelodysplastic syndrome consistent with refractory anemia  with excess blasts status post systemic chemotherapy with Vidaza 3 cycles and has been observation since that time. The patient is currently on Aranesp injection every 3 weeks and tolerating it well. His hemoglobin and hematocrit has been very low over the last few months and the patient require PRBCs transfusion more frequently. He continues to have significant anemia with hemoglobin of 6.8 today.  The patient required transfusion now on every other week basis. I recommended for him to proceed with transfusion tomorrow. I had a lengthy discussion with the patient and his wife about his condition.  The patient is not interested in any further treatment except for the supportive care. We discussed consideration of  palliative care and hospice referral and the patient his wife agreed to this option. We will continue with the supportive transfusion every other week. I will see the patient on as-needed basis at this point. He was advised to call if he has any concerning symptoms in the interval. The patient voices understanding of current disease status and treatment options and is in agreement with the current care plan. All questions were answered. The patient knows to call the clinic with any problems, questions or concerns. We can certainly see the patient much sooner if necessary.  Disclaimer: This note was dictated with voice recognition software. Similar sounding words can inadvertently be transcribed and may not be corrected upon review.

## 2018-08-03 ENCOUNTER — Other Ambulatory Visit: Payer: Self-pay | Admitting: *Deleted

## 2018-08-03 ENCOUNTER — Inpatient Hospital Stay: Payer: Medicare Other

## 2018-08-03 DIAGNOSIS — D649 Anemia, unspecified: Secondary | ICD-10-CM

## 2018-08-03 DIAGNOSIS — D4620 Refractory anemia with excess of blasts, unspecified: Secondary | ICD-10-CM | POA: Diagnosis not present

## 2018-08-03 LAB — FOLATE RBC
FOLATE, HEMOLYSATE: 303.1 ng/mL
Folate, RBC: 1486 ng/mL (ref >498)
Hematocrit: 20.4 % — ABNORMAL LOW (ref 37.5–51.0)

## 2018-08-03 LAB — PREPARE RBC (CROSSMATCH)

## 2018-08-03 MED ORDER — ACETAMINOPHEN 325 MG PO TABS
650.0000 mg | ORAL_TABLET | Freq: Once | ORAL | Status: AC
  Administered 2018-08-03: 650 mg via ORAL

## 2018-08-03 MED ORDER — DIPHENHYDRAMINE HCL 25 MG PO CAPS
ORAL_CAPSULE | ORAL | Status: AC
  Filled 2018-08-03: qty 1

## 2018-08-03 MED ORDER — DIPHENHYDRAMINE HCL 25 MG PO CAPS
25.0000 mg | ORAL_CAPSULE | Freq: Once | ORAL | Status: AC
  Administered 2018-08-03: 25 mg via ORAL

## 2018-08-03 MED ORDER — ACETAMINOPHEN 325 MG PO TABS
ORAL_TABLET | ORAL | Status: AC
  Filled 2018-08-03: qty 2

## 2018-08-03 MED ORDER — SODIUM CHLORIDE 0.9% IV SOLUTION
250.0000 mL | Freq: Once | INTRAVENOUS | Status: AC
  Administered 2018-08-03: 250 mL via INTRAVENOUS
  Filled 2018-08-03: qty 250

## 2018-08-03 NOTE — Patient Instructions (Signed)

## 2018-08-04 ENCOUNTER — Ambulatory Visit
Admission: RE | Admit: 2018-08-04 | Discharge: 2018-08-04 | Disposition: A | Discharge status: Home / Self Care | Payer: Medicare Other | Source: Ambulatory Visit | Attending: General Surgery | Admitting: General Surgery

## 2018-08-04 ENCOUNTER — Telehealth: Payer: Self-pay | Admitting: Medical Oncology

## 2018-08-04 ENCOUNTER — Encounter: Payer: Self-pay | Admitting: Radiology

## 2018-08-04 ENCOUNTER — Other Ambulatory Visit (HOSPITAL_COMMUNITY): Payer: Self-pay | Admitting: Interventional Radiology

## 2018-08-04 ENCOUNTER — Other Ambulatory Visit: Payer: No Typology Code available for payment source

## 2018-08-04 ENCOUNTER — Other Ambulatory Visit: Payer: Self-pay | Admitting: General Surgery

## 2018-08-04 DIAGNOSIS — K810 Acute cholecystitis: Secondary | ICD-10-CM

## 2018-08-04 HISTORY — PX: IR RADIOLOGIST EVAL & MGMT: IMG5224

## 2018-08-04 LAB — BPAM RBC
Blood Product Expiration Date: 201911292359
Blood Product Expiration Date: 201911292359
ISSUE DATE / TIME: 201911061145
ISSUE DATE / TIME: 201911061145
UNIT TYPE AND RH: 6200
Unit Type and Rh: 6200

## 2018-08-04 LAB — TYPE AND SCREEN
ABO/RH(D): A POS
Antibody Screen: NEGATIVE
UNIT DIVISION: 0
Unit division: 0

## 2018-08-04 NOTE — Progress Notes (Signed)
Referring Physician(s): Hoxworth,Benjamin  Chief Complaint: The patient is seen in follow up today s/p percutaneous cholecystostomy on 06/27/2018.  History of present illness: Mr. Hofland is an 84 year old male with history of myelodysplastic syndrome with refractory anemia as well as ITP who was recently referred for hospice care.  He also has a recent history of  acalculus cholecystitis, was deemed a poor surgical candidate and therefore underwent percutaneous cholecystostomy on 06/27/2018. His most recent cholangiogram on 07/26/2018 revealed patency of the cystic and common bile ducts.  His drain was subsequently capped.  He returns today for follow-up drain injection.  Since his last visit the patient states she has had more central to right lower quadrant abdominal discomfort.  Area around drain insertion site is slightly more erythematous and tender.  He denies fever, breathing difficulties, nausea, vomiting or bleeding.  He recently received blood transfusion at [facility].   Past Medical History:  Diagnosis Date  . Anemia   . Cancer (HCC)    myelodysplastic syndrome  . Complication of anesthesia    Reaction to anesthesia " things went haywire around me"  . Do not resuscitate 11/04/2015  . Hearing impairment    wears hearing left ear  . History of ITP 1960  . Hypothyroidism   . Meniere's syndrome 1980s  . Port catheter in place 12/23/2015    Past Surgical History:  Procedure Laterality Date  . cataract Right 2011  . CIRCUMCISION    . COLONOSCOPY    . cortisone injections to back x 6    . IR PERC CHOLECYSTOSTOMY  06/27/2018  . IR RADIOLOGIST EVAL & MGMT  07/26/2018  . ORIF ORBITAL FRACTURE Left 2000   2 fractures orbital platform    Allergies: Percocet [oxycodone-acetaminophen]; Hydrocodone; and Oxycodone  Medications: Prior to Admission medications   Medication Sig Start Date End Date Taking? Authorizing Provider  calcipotriene (DOVONOX) 0.005 % ointment Apply  topically 2 (two) times daily as needed (inflammation).     [provider]  Darbepoetin Alfa (ARANESP) 300 MCG/0.6ML SOSY injection Inject 300 mcg into the skin every 30 (thirty) days. As needed based on blood counts    [provider]  finasteride (PROPECIA) 1 MG tablet Take 1 mg by mouth daily with breakfast.     [provider]  Flaxseed, Linseed, (FLAX PO) Take 1 tablet by mouth daily.    [provider]  levothyroxine (SYNTHROID, LEVOTHROID) 175 MCG tablet Take 175 mcg by mouth at bedtime.     [provider]  polyethylene glycol (MIRALAX / GLYCOLAX) packet Take 17 g by mouth daily. 07/01/18   Mariel Aloe, MD  senna-docusate (SENOKOT-S) 8.6-50 MG tablet Take 2 tablets by mouth 2 (two) times daily. 06/30/18   Mariel Aloe, MD  sorbitol 70 % SOLN Take 30 mLs by mouth 2 (two) times daily. 07/12/18   Nita Sells, MD  traMADol (ULTRAM) 50 MG tablet Take 50 mg by mouth every 6 (six) hours as needed for moderate pain.     [provider]  triamcinolone (KENALOG) 0.025 % cream Apply 1 application topically 2 (two) times daily as needed (for inflammation).     [provider]     Family History  Problem Relation Age of Onset  . Throat cancer Other   . Pancreatic cancer Father   . Colon cancer Brother     Social History   Socioeconomic History  . Marital status: Married    Spouse name: Not on file  .  Number of children: Not on file  . Years of education: Not on file  . Highest education level: Not on file  Occupational History  . Not on file  Social Needs  . Financial resource strain: Not on file  . Food insecurity:    Worry: Not on file    Inability: Not on file  . Transportation needs:    Medical: Not on file    Non-medical: Not on file  Tobacco Use  . Smoking status: Never Smoker  . Smokeless tobacco: Never Used  Substance and Sexual Activity  . Alcohol use: Yes    Comment: Occasional  . Drug use:  No  . Sexual activity: Not Currently  Lifestyle  . Physical activity:    Days per week: Not on file    Minutes per session: Not on file  . Stress: Not on file  Relationships  . Social connections:    Talks on phone: Not on file    Gets together: Not on file    Attends religious service: Not on file    Active member of club or organization: Not on file    Attends meetings of clubs or organizations: Not on file    Relationship status: Not on file  Other Topics Concern  . Not on file  Social History Narrative  . Not on file     Vital Signs:There were no vitals filed for this visit.    Physical Exam awake, alert, hard of hearing.  Right abdominal drain intact, insertion site mildly erythematous and tender, sl fluctuant. .  No obvious leakage at site.  Drain currently capped. Abd soft, midly tender central abd region; ND, few BS.   Imaging: No results found.  Labs:  CBC: Recent Labs    07/10/18 0458 07/11/18 0530 07/20/18 1039 08/02/18 0845 08/02/18 0848  WBC 2.7* 3.3* 3.1*  --  2.0*  HGB 7.4* 7.3* 6.5*  --  6.8*  HCT 23.4* 23.0* 20.6* 20.4* 22.2*  PLT 41* 44* 75*  --  87*    COAGS: Recent Labs    06/26/18 0351 06/27/18 0356  INR 1.49 1.52  APTT 44*  --     BMP: Recent Labs    07/09/18 1631 07/10/18 0458 07/11/18 0530 07/12/18 0409  NA 130* 132* 135 138  K 4.6 4.0 4.0 4.0  CL 99 103 104 107  CO2 24 22 21* 23  GLUCOSE 108* 94 93 95  BUN 17 12 9 8   CALCIUM 8.7* 8.1* 8.0* 7.8*  CREATININE 1.07 0.79 0.54* 0.52*  GFRNONAA >60 >60 >60 >60  GFRAA >60 >60 >60 >60    LIVER FUNCTION TESTS: Recent Labs    06/26/18 0351 06/27/18 0356 07/09/18 1631 07/12/18 0409  BILITOT 1.0 1.2 1.1 0.8  AST 14* 17 23 21   ALT 14 15 30 25   ALKPHOS 47 47 45 51  PROT 6.1* 6.2* 7.1 6.6  ALBUMIN 2.6* 2.6* 3.3* 2.7*    Assessment: 84 year old male with history of myelodysplastic syndrome with refractory anemia as well as ITP who was recently referred for hospice  care.  He also has a recent history of acalculus cholecystitis, was deemed a poor surgical candidate and therefore underwent percutaneous cholecystostomy on 06/27/2018. His most recent cholangiogram on 07/26/2018 revealed patency of the cystic and common bile ducts.  His drain was subsequently capped.  He returns today for follow-up drain injection.  Drain injection today reveals appropriate placement in gallbladder lumen but no flow of contrast into cystic or common bile  ducts.  Drain connected back to gravity bag. Pt will be scheduled for drain exchange in Jan 2020. Plans d/w pt.    Signed: D. Rowe Robert, PA-C 08/04/2018, 2:29 PM    Please refer to Dr. Barbie Banner' attestation of this note for management and plan.      Patient ID: Elder Davidian, male   DOB: 1931-08-28, 84 y.o.   MRN: [id number]

## 2018-08-04 NOTE — Telephone Encounter (Signed)
Pt referred to Hospice . 

## 2018-08-08 MED FILL — NORMAL SALINE FLUSH SYRINGE: 0.9 | 50 days supply | Qty: 500 | Fill #0

## 2018-08-09 ENCOUNTER — Other Ambulatory Visit: Payer: No Typology Code available for payment source

## 2018-08-09 ENCOUNTER — Ambulatory Visit: Payer: Medicare Other | Admitting: Internal Medicine

## 2018-08-09 ENCOUNTER — Other Ambulatory Visit: Payer: Medicare Other

## 2018-08-17 ENCOUNTER — Other Ambulatory Visit: Payer: Self-pay | Admitting: *Deleted

## 2018-08-17 ENCOUNTER — Inpatient Hospital Stay: Payer: Medicare Other

## 2018-08-17 DIAGNOSIS — D649 Anemia, unspecified: Secondary | ICD-10-CM

## 2018-08-17 DIAGNOSIS — D4620 Refractory anemia with excess of blasts, unspecified: Secondary | ICD-10-CM | POA: Diagnosis not present

## 2018-08-17 LAB — CBC WITH DIFFERENTIAL/PLATELET
ABS IMMATURE GRANULOCYTES: 0.3 10*3/uL — AB (ref 0.00–0.07)
BAND NEUTROPHILS: 8 %
Basophils Absolute: 0 10*3/uL (ref 0.0–0.1)
Basophils Relative: 1 %
Eosinophils Absolute: 0 10*3/uL (ref 0.0–0.5)
Eosinophils Relative: 0 %
HEMATOCRIT: 23.1 % — AB (ref 39.0–52.0)
Hemoglobin: 7.3 g/dL — ABNORMAL LOW (ref 13.0–17.0)
LYMPHS PCT: 68 %
Lymphs Abs: 2 10*3/uL (ref 0.7–4.0)
MCH: 28.6 pg (ref 26.0–34.0)
MCHC: 31.6 g/dL (ref 30.0–36.0)
MCV: 90.6 fL (ref 80.0–100.0)
MONOS PCT: 2 %
MYELOCYTES: 3 %
Metamyelocytes Relative: 7 %
Monocytes Absolute: 0.1 10*3/uL (ref 0.1–1.0)
Neutro Abs: 0.6 10*3/uL — ABNORMAL LOW (ref 1.7–17.7)
Neutrophils Relative %: 11 %
Platelets: 109 10*3/uL — ABNORMAL LOW (ref 150–400)
RBC: 2.55 MIL/uL — AB (ref 4.22–5.81)
RDW: 15.4 % (ref 11.5–15.5)
WBC: 2.9 10*3/uL — AB (ref 4.0–10.5)
nRBC: 1.1 % — ABNORMAL HIGH (ref 0.0–0.2)

## 2018-08-17 LAB — PREPARE RBC (CROSSMATCH)

## 2018-08-17 MED ORDER — SODIUM CHLORIDE 0.9% IV SOLUTION
250.0000 mL | Freq: Once | INTRAVENOUS | Status: DC
  Filled 2018-08-17: qty 250

## 2018-08-17 MED ORDER — DIPHENHYDRAMINE HCL 25 MG PO CAPS
ORAL_CAPSULE | ORAL | Status: AC
  Filled 2018-08-17: qty 1

## 2018-08-17 MED ORDER — ACETAMINOPHEN 325 MG PO TABS
650.0000 mg | ORAL_TABLET | Freq: Once | ORAL | Status: AC
  Administered 2018-08-17: 650 mg via ORAL

## 2018-08-17 MED ORDER — SODIUM CHLORIDE 0.9% IV SOLUTION
250.0000 mL | Freq: Once | INTRAVENOUS | Status: AC
  Administered 2018-08-17: 250 mL via INTRAVENOUS
  Filled 2018-08-17: qty 250

## 2018-08-17 MED ORDER — ACETAMINOPHEN 325 MG PO TABS
ORAL_TABLET | ORAL | Status: AC
  Filled 2018-08-17: qty 2

## 2018-08-17 MED ORDER — DIPHENHYDRAMINE HCL 25 MG PO CAPS
25.0000 mg | ORAL_CAPSULE | Freq: Once | ORAL | Status: AC
  Administered 2018-08-17: 25 mg via ORAL

## 2018-08-17 NOTE — Patient Instructions (Signed)

## 2018-08-18 ENCOUNTER — Other Ambulatory Visit: Payer: Medicare Other | Admitting: Nurse Practitioner

## 2018-08-18 LAB — BPAM RBC
BLOOD PRODUCT EXPIRATION DATE: 201912132359
BLOOD PRODUCT EXPIRATION DATE: 201912132359
ISSUE DATE / TIME: 201911201023
ISSUE DATE / TIME: 201911201203
UNIT TYPE AND RH: 6200
Unit Type and Rh: 6200

## 2018-08-18 LAB — TYPE AND SCREEN
ABO/RH(D): A POS
Antibody Screen: NEGATIVE
Unit division: 0
Unit division: 0

## 2018-08-19 ENCOUNTER — Telehealth: Payer: Self-pay

## 2018-08-19 NOTE — Telephone Encounter (Signed)
Phone call placed to patient's wife to reschedule visit with Palliative Care. Visit rescheduled for Tuesday 08/23/18

## 2018-08-23 ENCOUNTER — Other Ambulatory Visit: Payer: Medicare Other | Admitting: Nurse Practitioner

## 2018-08-23 DIAGNOSIS — D649 Anemia, unspecified: Secondary | ICD-10-CM

## 2018-08-23 DIAGNOSIS — K5900 Constipation, unspecified: Secondary | ICD-10-CM

## 2018-08-23 DIAGNOSIS — R269 Unspecified abnormalities of gait and mobility: Secondary | ICD-10-CM

## 2018-08-23 DIAGNOSIS — D464 Refractory anemia, unspecified: Secondary | ICD-10-CM

## 2018-08-23 DIAGNOSIS — Z515 Encounter for palliative care: Secondary | ICD-10-CM

## 2018-08-23 NOTE — Progress Notes (Signed)
[facility] Telephone: [phone] Fax: [phone]  PATIENT NAME: Dean Stanley DOB: 1931/06/07 MRN: [id number]  PRIMARY CARE PROVIDER:   Lorene Dy, MD  REFERRING PROVIDER:  Lorene Dy, [place], [place], [place] [address]  RESPONSIBLE PARTY:  Beckie Busing (spouse) [phone] (home and mobile)  HISTORY OF PRESENT ILLNESS:  Dean Stanley is a 84 y.o. year old male with multiple medical problems including MDS, pancytopenia, refractory symptomatic anemia; blood transfusions every three weeks, acute cholecystitis,hypothyroidism, chronic constipation. Palliative Care was asked to help with symptom management and to  address goals of care.    ASSESSMENT/PLAN/ RECOMMENDATIONS    -MDS (Dx 12/16) -Symptomatic anemia -pancytopenia -followed by Dr. Earlie Server -s/p 3 cycles of vidaza; d/c'd by patient due to fatigue and nausea -continues with frequent blood transfusions every 3weeks 0-aranesp -plt count 109 -patient wants to continue with blood transfusions q3weeks  Acute cholecystitis  Hypothyroidism  constipation -patient not surgical candidate due to pancytopenia; has percutaneous drain -denies nausea; eating well -wife flushes -levothyroxine miralax , senna, sorbitol -continue same  ACP -DNR and MOST    I spent 60 minutes providing this consultation,  from 10:00 to 11:00. More than 50% of the time in this consultation was spent coordinating communication.   CODE STATUS: DNR and MOST  PPS: 60% HOSPICE ELIGIBILITY/DIAGNOSIS: TBD  PAST MEDICAL HISTORY:  Past Medical History:  Diagnosis Date  . Anemia   . Cancer (HCC)    myelodysplastic syndrome  . Complication of anesthesia    Reaction to anesthesia " things went haywire around me"  . Do not resuscitate 11/04/2015  . Hearing impairment    wears hearing left ear  . History of ITP 1960  . Hypothyroidism   . Meniere's syndrome 1980s  . Port catheter in place 12/23/2015     SOCIAL HX:  Social History   Tobacco Use  . Smoking status: Never Smoker  . Smokeless tobacco: Never Used  Substance Use Topics  . Alcohol use: Yes    Comment: Occasional    ALLERGIES:  Allergies  Allergen Reactions  . Percocet [Oxycodone-Acetaminophen] Nausea And Vomiting  . Hydrocodone Nausea And Vomiting  . Oxycodone Nausea And Vomiting     PERTINENT MEDICATIONS:  Outpatient Encounter Medications as of 08/23/2018  Medication Sig  . calcipotriene (DOVONOX) 0.005 % ointment Apply topically 2 (two) times daily as needed (inflammation).   . Darbepoetin Alfa (ARANESP) 300 MCG/0.6ML SOSY injection Inject 300 mcg into the skin every 30 (thirty) days. As needed based on blood counts  . finasteride (PROPECIA) 1 MG tablet Take 1 mg by mouth daily with breakfast.   . Flaxseed, Linseed, (FLAX PO) Take 1 tablet by mouth daily.  Marland Kitchen levothyroxine (SYNTHROID, LEVOTHROID) 175 MCG tablet Take 175 mcg by mouth at bedtime.   . polyethylene glycol (MIRALAX / GLYCOLAX) packet Take 17 g by mouth daily.  Marland Kitchen senna-docusate (SENOKOT-S) 8.6-50 MG tablet Take 2 tablets by mouth 2 (two) times daily.  . sorbitol 70 % SOLN Take 30 mLs by mouth 2 (two) times daily.  . traMADol (ULTRAM) 50 MG tablet Take 50 mg by mouth every 6 (six) hours as needed for moderate pain.   Marland Kitchen triamcinolone (KENALOG) 0.025 % cream Apply 1 application topically 2 (two) times daily as needed (for inflammation).    Facility-Administered Encounter Medications as of 08/23/2018  Medication  . Darbepoetin Alfa (ARANESP) injection 300 mcg    PHYSICAL EXAM:   General: NAD, frail appearing, thin Cardiovascular: regular rate  and rhythm Pulmonary: clear ant fields Abdomen: soft, nontender, + bowel sounds GU: no suprapubic tenderness Extremities: no edema, no joint deformities Skin: no rashes Neurological: Weakness but otherwise nonfocal  Kinzie Wickes G [place], NP

## 2018-08-26 ENCOUNTER — Encounter: Payer: Self-pay | Admitting: Nurse Practitioner

## 2018-08-29 ENCOUNTER — Other Ambulatory Visit (HOSPITAL_COMMUNITY): Payer: Self-pay | Admitting: Interventional Radiology

## 2018-08-29 DIAGNOSIS — K810 Acute cholecystitis: Secondary | ICD-10-CM

## 2018-08-30 ENCOUNTER — Telehealth: Payer: Self-pay | Admitting: Medical Oncology

## 2018-08-30 ENCOUNTER — Other Ambulatory Visit: Payer: Self-pay | Admitting: Medical Oncology

## 2018-08-30 ENCOUNTER — Inpatient Hospital Stay: Payer: Medicare Other

## 2018-08-30 ENCOUNTER — Telehealth: Payer: Self-pay | Admitting: Internal Medicine

## 2018-08-30 ENCOUNTER — Inpatient Hospital Stay: Payer: Medicare Other | Attending: Internal Medicine

## 2018-08-30 DIAGNOSIS — D469 Myelodysplastic syndrome, unspecified: Secondary | ICD-10-CM

## 2018-08-30 DIAGNOSIS — D4620 Refractory anemia with excess of blasts, unspecified: Secondary | ICD-10-CM | POA: Diagnosis not present

## 2018-08-30 DIAGNOSIS — D649 Anemia, unspecified: Secondary | ICD-10-CM

## 2018-08-30 LAB — CBC WITH DIFFERENTIAL (CANCER CENTER ONLY)
Abs Immature Granulocytes: 0.2 10*3/uL — ABNORMAL HIGH (ref 0.00–0.07)
BASOS PCT: 0 %
Basophils Absolute: 0 10*3/uL (ref 0.0–0.1)
Blasts: 1 %
EOS ABS: 0 10*3/uL (ref 0.0–0.5)
Eosinophils Relative: 0 %
HEMATOCRIT: 26 % — AB (ref 39.0–52.0)
HEMOGLOBIN: 8.3 g/dL — AB (ref 13.0–17.0)
Lymphocytes Relative: 43 %
Lymphs Abs: 1.1 10*3/uL (ref 0.7–4.0)
MCH: 28.1 pg (ref 26.0–34.0)
MCHC: 31.9 g/dL (ref 30.0–36.0)
MCV: 88.1 fL (ref 80.0–100.0)
Metamyelocytes Relative: 5 %
Monocytes Absolute: 0.3 10*3/uL (ref 0.1–1.0)
Monocytes Relative: 12 %
Myelocytes: 1 %
NEUTROS PCT: 38 %
Neutro Abs: 1 10*3/uL — ABNORMAL LOW (ref 1.7–17.7)
Platelet Count: 98 10*3/uL — ABNORMAL LOW (ref 150–400)
RBC: 2.95 MIL/uL — ABNORMAL LOW (ref 4.22–5.81)
RDW: 14.7 % (ref 11.5–15.5)
WBC Count: 2.6 10*3/uL — ABNORMAL LOW (ref 4.0–10.5)
nRBC: 0.8 % — ABNORMAL HIGH (ref 0.0–0.2)

## 2018-08-30 LAB — TYPE AND SCREEN
ABO/RH(D): A POS
Antibody Screen: NEGATIVE

## 2018-08-30 NOTE — Telephone Encounter (Addendum)
Labs reviewed with wife and pt. Dr Julien Nordmann does not want to transfuse with HGB 8.3.  Shedule request sent for labs next week.

## 2018-08-30 NOTE — Telephone Encounter (Signed)
Scheduled appt per 1/23 sch message - pt is aware of appt date and time  

## 2018-09-06 ENCOUNTER — Telehealth: Payer: Self-pay | Admitting: *Deleted

## 2018-09-06 ENCOUNTER — Inpatient Hospital Stay: Payer: Medicare Other

## 2018-09-06 ENCOUNTER — Other Ambulatory Visit: Payer: Self-pay | Admitting: Medical Oncology

## 2018-09-06 DIAGNOSIS — D649 Anemia, unspecified: Secondary | ICD-10-CM

## 2018-09-06 DIAGNOSIS — D4620 Refractory anemia with excess of blasts, unspecified: Secondary | ICD-10-CM | POA: Diagnosis not present

## 2018-09-06 DIAGNOSIS — D469 Myelodysplastic syndrome, unspecified: Secondary | ICD-10-CM

## 2018-09-06 LAB — CBC WITH DIFFERENTIAL (CANCER CENTER ONLY)
Abs Immature Granulocytes: 0.19 10*3/uL — ABNORMAL HIGH (ref 0.00–0.07)
BASOS ABS: 0 10*3/uL (ref 0.0–0.1)
Basophils Relative: 1 %
EOS ABS: 0 10*3/uL (ref 0.0–0.5)
Eosinophils Relative: 0 %
HCT: 21.2 % — ABNORMAL LOW (ref 39.0–52.0)
Hemoglobin: 6.8 g/dL — CL (ref 13.0–17.0)
Immature Granulocytes: 9 %
Lymphocytes Relative: 37 %
Lymphs Abs: 0.8 10*3/uL (ref 0.7–4.0)
MCH: 28.6 pg (ref 26.0–34.0)
MCHC: 32.1 g/dL (ref 30.0–36.0)
MCV: 89.1 fL (ref 80.0–100.0)
Monocytes Absolute: 0.4 10*3/uL (ref 0.1–1.0)
Monocytes Relative: 16 %
Neutro Abs: 0.8 10*3/uL — ABNORMAL LOW (ref 1.7–7.7)
Neutrophils Relative %: 37 %
Platelet Count: 98 10*3/uL — ABNORMAL LOW (ref 150–400)
RBC: 2.38 MIL/uL — ABNORMAL LOW (ref 4.22–5.81)
RDW: 14.8 % (ref 11.5–15.5)
WBC: 2.2 10*3/uL — AB (ref 4.0–10.5)
nRBC: 0 % (ref 0.0–0.2)

## 2018-09-06 LAB — SAMPLE TO BLOOD BANK

## 2018-09-06 LAB — PREPARE RBC (CROSSMATCH)

## 2018-09-06 MED ORDER — SODIUM CHLORIDE 0.9% FLUSH
3.0000 mL | INTRAVENOUS | Status: DC | PRN
  Filled 2018-09-06: qty 10

## 2018-09-06 MED ORDER — SODIUM CHLORIDE 0.9% IV SOLUTION
250.0000 mL | Freq: Once | INTRAVENOUS | Status: AC
  Administered 2018-09-06: 250 mL via INTRAVENOUS
  Filled 2018-09-06: qty 250

## 2018-09-06 MED ORDER — ACETAMINOPHEN 325 MG PO TABS
650.0000 mg | ORAL_TABLET | Freq: Once | ORAL | Status: AC
  Administered 2018-09-06: 650 mg via ORAL

## 2018-09-06 MED ORDER — DIPHENHYDRAMINE HCL 25 MG PO CAPS
25.0000 mg | ORAL_CAPSULE | Freq: Once | ORAL | Status: AC
  Administered 2018-09-06: 25 mg via ORAL

## 2018-09-06 MED ORDER — ACETAMINOPHEN 325 MG PO TABS
ORAL_TABLET | ORAL | Status: AC
  Filled 2018-09-06: qty 2

## 2018-09-06 MED ORDER — DIPHENHYDRAMINE HCL 25 MG PO CAPS
ORAL_CAPSULE | ORAL | Status: AC
  Filled 2018-09-06: qty 1

## 2018-09-06 NOTE — Patient Instructions (Signed)

## 2018-09-06 NOTE — Telephone Encounter (Signed)
TCT from [facility] in the lab with critical lab result HGB of 6.8.  Pt is scheduled for blood transfusion today.

## 2018-09-07 LAB — TYPE AND SCREEN
ABO/RH(D): A POS
Antibody Screen: NEGATIVE
Unit division: 0
Unit division: 0

## 2018-09-07 LAB — BPAM RBC
BLOOD PRODUCT EXPIRATION DATE: 202001032359
Blood Product Expiration Date: 202001032359
ISSUE DATE / TIME: 201912101210
ISSUE DATE / TIME: 201912101210
UNIT TYPE AND RH: 6200
Unit Type and Rh: 6200

## 2018-09-13 ENCOUNTER — Inpatient Hospital Stay: Payer: Medicare Other

## 2018-09-13 VITALS — BP 90/62 | HR 80 | Temp 98.5°F | Resp 20

## 2018-09-13 DIAGNOSIS — D649 Anemia, unspecified: Secondary | ICD-10-CM

## 2018-09-13 DIAGNOSIS — D469 Myelodysplastic syndrome, unspecified: Secondary | ICD-10-CM

## 2018-09-13 DIAGNOSIS — D4620 Refractory anemia with excess of blasts, unspecified: Secondary | ICD-10-CM | POA: Diagnosis not present

## 2018-09-13 LAB — CBC WITH DIFFERENTIAL (CANCER CENTER ONLY)
Abs Immature Granulocytes: 0.1 10*3/uL — ABNORMAL HIGH (ref 0.00–0.07)
Band Neutrophils: 8 %
Basophils Absolute: 0 10*3/uL (ref 0.0–0.1)
Basophils Relative: 0 %
Blasts: 4 %
Eosinophils Absolute: 0 10*3/uL (ref 0.0–0.5)
Eosinophils Relative: 0 %
HCT: 26.1 % — ABNORMAL LOW (ref 39.0–52.0)
Hemoglobin: 8.3 g/dL — ABNORMAL LOW (ref 13.0–17.0)
Lymphocytes Relative: 40 %
Lymphs Abs: 1.3 10*3/uL (ref 0.7–4.0)
MCH: 27.9 pg (ref 26.0–34.0)
MCHC: 31.8 g/dL (ref 30.0–36.0)
MCV: 87.9 fL (ref 80.0–100.0)
MONOS PCT: 2 %
Metamyelocytes Relative: 2 %
Monocytes Absolute: 0.1 10*3/uL (ref 0.1–1.0)
Myelocytes: 1 %
Neutro Abs: 1.6 10*3/uL — ABNORMAL LOW (ref 1.7–17.7)
Neutrophils Relative %: 43 %
Platelet Count: 80 10*3/uL — ABNORMAL LOW (ref 150–400)
RBC: 2.97 MIL/uL — AB (ref 4.22–5.81)
RDW: 14.9 % (ref 11.5–15.5)
WBC: 3.2 10*3/uL — AB (ref 4.0–10.5)
nRBC: 0 % (ref 0.0–0.2)

## 2018-09-13 LAB — SAMPLE TO BLOOD BANK

## 2018-09-13 MED ORDER — DARBEPOETIN ALFA 300 MCG/0.6ML IJ SOSY
300.0000 ug | PREFILLED_SYRINGE | Freq: Once | INTRAMUSCULAR | Status: AC
  Administered 2018-09-13: 300 ug via SUBCUTANEOUS

## 2018-09-13 MED ORDER — DARBEPOETIN ALFA 300 MCG/0.6ML IJ SOSY
PREFILLED_SYRINGE | INTRAMUSCULAR | Status: AC
  Filled 2018-09-13: qty 0.6

## 2018-09-13 NOTE — Patient Instructions (Signed)

## 2018-09-14 ENCOUNTER — Telehealth: Payer: Self-pay | Admitting: Internal Medicine

## 2018-09-14 NOTE — Telephone Encounter (Signed)
Scheduled appt per 12/17 sch message - pt is aware of appts.

## 2018-09-22 ENCOUNTER — Inpatient Hospital Stay: Payer: Medicare Other

## 2018-09-22 ENCOUNTER — Other Ambulatory Visit: Payer: Medicare Other

## 2018-09-22 ENCOUNTER — Encounter: Payer: Self-pay | Admitting: *Deleted

## 2018-09-22 DIAGNOSIS — D4620 Refractory anemia with excess of blasts, unspecified: Secondary | ICD-10-CM | POA: Diagnosis not present

## 2018-09-22 DIAGNOSIS — D649 Anemia, unspecified: Secondary | ICD-10-CM

## 2018-09-22 DIAGNOSIS — D469 Myelodysplastic syndrome, unspecified: Secondary | ICD-10-CM

## 2018-09-22 LAB — CBC WITH DIFFERENTIAL (CANCER CENTER ONLY)
Abs Immature Granulocytes: 0.7 10*3/uL — ABNORMAL HIGH (ref 0.00–0.07)
Band Neutrophils: 20 %
Basophils Absolute: 0 10*3/uL (ref 0.0–0.1)
Basophils Relative: 0 %
Blasts: 5 %
Eosinophils Absolute: 0 10*3/uL (ref 0.0–0.5)
Eosinophils Relative: 0 %
HCT: 24 % — ABNORMAL LOW (ref 39.0–52.0)
Hemoglobin: 7.5 g/dL — ABNORMAL LOW (ref 13.0–17.0)
LYMPHS ABS: 1.6 10*3/uL (ref 0.7–4.0)
Lymphocytes Relative: 31 %
MCH: 28 pg (ref 26.0–34.0)
MCHC: 31.3 g/dL (ref 30.0–36.0)
MCV: 89.6 fL (ref 80.0–100.0)
MONOS PCT: 10 %
Metamyelocytes Relative: 5 %
Monocytes Absolute: 0.5 10*3/uL (ref 0.1–1.0)
Myelocytes: 9 %
Neutro Abs: 2 10*3/uL (ref 1.7–17.7)
Neutrophils Relative %: 18 %
Other: 2 %
Platelet Count: 118 10*3/uL — ABNORMAL LOW (ref 150–400)
RBC: 2.68 MIL/uL — AB (ref 4.22–5.81)
RDW: 15.1 % (ref 11.5–15.5)
WBC: 5.2 10*3/uL (ref 4.0–10.5)
nRBC: 0.6 % — ABNORMAL HIGH (ref 0.0–0.2)

## 2018-09-23 ENCOUNTER — Other Ambulatory Visit: Payer: Self-pay

## 2018-09-23 ENCOUNTER — Inpatient Hospital Stay: Payer: Medicare Other

## 2018-09-23 DIAGNOSIS — D469 Myelodysplastic syndrome, unspecified: Secondary | ICD-10-CM

## 2018-09-23 DIAGNOSIS — D4620 Refractory anemia with excess of blasts, unspecified: Secondary | ICD-10-CM | POA: Diagnosis not present

## 2018-09-23 LAB — PREPARE RBC (CROSSMATCH)

## 2018-09-23 MED ORDER — ACETAMINOPHEN 325 MG PO TABS
ORAL_TABLET | ORAL | Status: AC
  Filled 2018-09-23: qty 2

## 2018-09-23 MED ORDER — DIPHENHYDRAMINE HCL 25 MG PO CAPS
25.0000 mg | ORAL_CAPSULE | Freq: Once | ORAL | Status: AC
  Administered 2018-09-23: 25 mg via ORAL

## 2018-09-23 MED ORDER — ACETAMINOPHEN 325 MG PO TABS
650.0000 mg | ORAL_TABLET | Freq: Once | ORAL | Status: AC
  Administered 2018-09-23: 650 mg via ORAL

## 2018-09-23 MED ORDER — SODIUM CHLORIDE 0.9% IV SOLUTION
250.0000 mL | Freq: Once | INTRAVENOUS | Status: AC
  Administered 2018-09-23: 250 mL via INTRAVENOUS
  Filled 2018-09-23: qty 250

## 2018-09-23 MED ORDER — DIPHENHYDRAMINE HCL 25 MG PO CAPS
ORAL_CAPSULE | ORAL | Status: AC
  Filled 2018-09-23: qty 1

## 2018-09-23 NOTE — Patient Instructions (Signed)
Blood Transfusion, Adult, Care After This sheet gives you information about how to care for yourself after your procedure. Your doctor may also give you more specific instructions. If you have problems or questions, contact your doctor. Follow these instructions at home:   Take over-the-counter and prescription medicines only as told by your doctor.  Go back to your normal activities as told by your doctor.  Follow instructions from your doctor about how to take care of the area where an IV tube was put into your vein (insertion site). Make sure you: ? Wash your hands with soap and water before you change your bandage (dressing). If there is no soap and water, use hand sanitizer. ? Change your bandage as told by your doctor.  Check your IV insertion site every day for signs of infection. Check for: ? More redness, swelling, or pain. ? More fluid or blood. ? Warmth. ? Pus or a bad smell. Contact a doctor if:  You have more redness, swelling, or pain around the IV insertion site.  You have more fluid or blood coming from the IV insertion site.  Your IV insertion site feels warm to the touch.  You have pus or a bad smell coming from the IV insertion site.  Your pee (urine) turns pink, red, or brown.  You feel weak after doing your normal activities. Get help right away if:  You have signs of a serious allergic or body defense (immune) system reaction, including: ? Itchiness. ? Hives. ? Trouble breathing. ? Anxiety. ? Pain in your chest or lower back. ? Fever, flushing, and chills. ? Fast pulse. ? Rash. ? Watery poop (diarrhea). ? Throwing up (vomiting). ? Dark pee. ? Serious headache. ? Dizziness. ? Stiff neck. ? Yellow color in your face or the white parts of your eyes (jaundice). Summary  After a blood transfusion, return to your normal activities as told by your doctor.  Every day, check for signs of infection where the IV tube was put into your vein.  Some  signs of infection are warm skin, more redness and pain, more fluid or blood, and pus or a bad smell where the needle went in.  Contact your doctor if you feel weak or have any unusual symptoms. This information is not intended to replace advice given to you by your health care provider. Make sure you discuss any questions you have with your health care provider. Document Released: 10/05/2014 Document Revised: 05/08/2016 Document Reviewed: 05/08/2016 Elsevier Interactive Patient Education  2019 [employer]  

## 2018-09-26 ENCOUNTER — Inpatient Hospital Stay: Payer: Medicare Other

## 2018-09-26 ENCOUNTER — Telehealth: Payer: Self-pay

## 2018-09-26 DIAGNOSIS — D4620 Refractory anemia with excess of blasts, unspecified: Secondary | ICD-10-CM | POA: Diagnosis not present

## 2018-09-26 DIAGNOSIS — D469 Myelodysplastic syndrome, unspecified: Secondary | ICD-10-CM

## 2018-09-26 DIAGNOSIS — D649 Anemia, unspecified: Secondary | ICD-10-CM

## 2018-09-26 LAB — CBC WITH DIFFERENTIAL (CANCER CENTER ONLY)
Abs Immature Granulocytes: 0.67 10*3/uL — ABNORMAL HIGH (ref 0.00–0.07)
Basophils Absolute: 0 10*3/uL (ref 0.0–0.1)
Basophils Relative: 0 %
Eosinophils Absolute: 0 10*3/uL (ref 0.0–0.5)
Eosinophils Relative: 0 %
HCT: 25.3 % — ABNORMAL LOW (ref 39.0–52.0)
Hemoglobin: 8.1 g/dL — ABNORMAL LOW (ref 13.0–17.0)
IMMATURE GRANULOCYTES: 11 %
Lymphocytes Relative: 11 %
Lymphs Abs: 0.7 10*3/uL (ref 0.7–4.0)
MCH: 27.8 pg (ref 26.0–34.0)
MCHC: 32 g/dL (ref 30.0–36.0)
MCV: 86.9 fL (ref 80.0–100.0)
Monocytes Absolute: 1.4 10*3/uL — ABNORMAL HIGH (ref 0.1–1.0)
Monocytes Relative: 22 %
NEUTROS PCT: 56 %
Neutro Abs: 3.6 10*3/uL (ref 1.7–7.7)
Platelet Count: 130 10*3/uL — ABNORMAL LOW (ref 150–400)
RBC: 2.91 MIL/uL — ABNORMAL LOW (ref 4.22–5.81)
RDW: 14.9 % (ref 11.5–15.5)
WBC Count: 6.3 10*3/uL (ref 4.0–10.5)
nRBC: 0.3 % — ABNORMAL HIGH (ref 0.0–0.2)

## 2018-09-26 LAB — TYPE AND SCREEN
ABO/RH(D): A POS
ABO/RH(D): A POS
Antibody Screen: NEGATIVE
Antibody Screen: NEGATIVE
Unit division: 0

## 2018-09-26 LAB — BPAM RBC
Blood Product Expiration Date: 202001212359
ISSUE DATE / TIME: 201912270845
UNIT TYPE AND RH: 6200

## 2018-09-26 NOTE — Telephone Encounter (Signed)
Received message to call patient's wife to schedule a follow up visit with Palliative Care. Visit scheduled for 09/27/18 @ 3:30pm

## 2018-09-26 NOTE — Progress Notes (Signed)
Dean Levine, Dean Stanley here regarding pt and wife concerns. Walked over to infusion and discussed with pt and wife pt does not need transfusion today as  hgb 8.1. Pt upset asked " why did I have to come and why do I  need to keep coming if Dr. Julien Nordmann has mentioned theres nothing else he can do for me." Discussed in depth with pt and wife pt does not have to continue to come back if he does not want to.  Palliative care is an option that bridges the gap to hospice. Pt and wife verbalized  understanding, he and wife will discuss and call office regarding a palliative care referral or to schedule an appt with MD to discuss this or hospice as an option

## 2018-09-27 ENCOUNTER — Other Ambulatory Visit: Payer: Medicare Other | Admitting: Nurse Practitioner

## 2018-09-27 DIAGNOSIS — Z515 Encounter for palliative care: Secondary | ICD-10-CM

## 2018-09-27 DIAGNOSIS — E46 Unspecified protein-calorie malnutrition: Secondary | ICD-10-CM

## 2018-09-27 DIAGNOSIS — M545 Low back pain: Secondary | ICD-10-CM

## 2018-09-27 DIAGNOSIS — R531 Weakness: Secondary | ICD-10-CM

## 2018-09-27 DIAGNOSIS — R630 Anorexia: Secondary | ICD-10-CM

## 2018-09-27 DIAGNOSIS — G8929 Other chronic pain: Secondary | ICD-10-CM

## 2018-09-27 DIAGNOSIS — K5900 Constipation, unspecified: Secondary | ICD-10-CM

## 2018-09-27 DIAGNOSIS — D649 Anemia, unspecified: Secondary | ICD-10-CM

## 2018-09-27 DIAGNOSIS — D464 Refractory anemia, unspecified: Secondary | ICD-10-CM

## 2018-09-27 NOTE — Progress Notes (Signed)
[facility] Telephone: [phone] Fax: [phone]  PATIENT NAME: Dean Stanley DOB: 12/18/1930 MRN: [id number]  PRIMARY CARE PROVIDER:   Lorene Dy, MD  REFERRING PROVIDER:  Lorene Stanley, [place], [place],  [id number]  RESPONSIBLE PARTY:  Dean Stanley (spouse) [phone] (home and mobile)  HISTORY OF PRESENT ILLNESS:  Dean Stanley is a 84 y.o. year old male with multiple medical problems including MDS, pancytopenia, refractory symptomatic anemia; blood transfusions every three weeks, acute cholecystitis,hypothyroidism, chronic constipation. Palliative Care was asked to help with symptom management and to  address goals of care.    ASSESSMENT/PLAN/ RECOMMENDATIONS    -Chronic back pain -patient currently taking tramadol 50mg  but says it is only partial effective for approx one hour -patient has has N&V with opioids in the past -increase tramadol to 100mg  every 4 hrs mild-moderate pain -if tramadol not effective methadone 2.5mg  every 8hrs PRN for severe pain  -failure to thrive -weight loss -protein-calorie malnutrition -suspect this is due to advancement of cancer; doubt any benefit of appetite stimulant  -attempt to increase caloric and protein intake  -MDS (Dx 12/16) -Symptomatic anemia -pancytopenia -followed by Dr. Earlie Stanley -s/p 3 cycles of vidaza; d/c'd by patient due to fatigue and nausea -patient does not want further treatment of cancer but wants to continue supportive care with blood transfusions and arasnep  -continues with frequent blood transfusions every 2weeks 0-aranesp -plt count 109 -patient and spouse informed if patient was hospice eligible due to no further treatment of cancer but  wants to continue with blood transfusions q2weeks -will consult with MD  Acute cholecystitis  Hypothyroidism  constipation -patient not surgical candidate due to pancytopenia; has percutaneous drain -denies nausea;  eating well -wife flushes -levothyroxine miralax , senna, sorbitol -continue same  ACP -DNR and MOST    I spent 60 minutes providing this consultation,  from 10:00 to 11:00. More than 50% of the time in this consultation was spent coordinating communication.   CODE STATUS: DNR and MOST  PPS: 40% HOSPICE ELIGIBILITY/DIAGNOSIS: TBD  PAST MEDICAL HISTORY:  Past Medical History:  Diagnosis Date  . Anemia   . Cancer (HCC)    myelodysplastic syndrome  . Complication of anesthesia    Reaction to anesthesia " things went haywire around me"  . Do not resuscitate 11/04/2015  . Hearing impairment    wears hearing left ear  . History of ITP 1960  . Hypothyroidism   . Meniere's syndrome 1980s  . Port catheter in place 12/23/2015    SOCIAL HX:  Social History   Tobacco Use  . Smoking status: Never Smoker  . Smokeless tobacco: Never Used  Substance Use Topics  . Alcohol use: Yes    Comment: Occasional    ALLERGIES:  Allergies  Allergen Reactions  . Percocet [Oxycodone-Acetaminophen] Nausea And Vomiting  . Hydrocodone Nausea And Vomiting  . Oxycodone Nausea And Vomiting     PERTINENT MEDICATIONS:  Outpatient Encounter Medications as of 09/27/2018  Medication Sig  . calcipotriene (DOVONOX) 0.005 % ointment Apply topically 2 (two) times daily as needed (inflammation).   . Darbepoetin Alfa (ARANESP) 300 MCG/0.6ML SOSY injection Inject 300 mcg into the skin every 30 (thirty) days. As needed based on blood counts  . finasteride (PROPECIA) 1 MG tablet Take 1 mg by mouth daily with breakfast.   . Flaxseed, Linseed, (FLAX PO) Take 1 tablet by mouth daily.  Marland Kitchen levothyroxine (SYNTHROID, LEVOTHROID) 175 MCG tablet Take 175 mcg by mouth at bedtime.   Marland Kitchen  polyethylene glycol (MIRALAX / GLYCOLAX) packet Take 17 g by mouth daily.  Marland Kitchen senna-docusate (SENOKOT-S) 8.6-50 MG tablet Take 2 tablets by mouth 2 (two) times daily.  . sorbitol 70 % SOLN Take 30 mLs by mouth 2 (two) times daily.  . traMADol  (ULTRAM) 50 MG tablet Take 50 mg by mouth every 6 (six) hours as needed for moderate pain.   Marland Kitchen triamcinolone (KENALOG) 0.025 % cream Apply 1 application topically 2 (two) times daily as needed (for inflammation).    Facility-Administered Encounter Medications as of 09/27/2018  Medication  . Darbepoetin Alfa (ARANESP) injection 300 mcg    PHYSICAL EXAM:   General: NAD, frail appearing, cachectic with temporal wasting Cardiovascular: regular rate and rhythm Pulmonary: clear ant fields Abdomen: soft, nontender, + bowel sounds GU: no suprapubic tenderness Extremities: no edema, no joint deformities Skin: no rashes Neurological: Weakness but otherwise nonfocal  Dean Stanley G Martin[place], NP

## 2018-09-28 ENCOUNTER — Other Ambulatory Visit: Payer: Self-pay | Admitting: Internal Medicine

## 2018-09-29 ENCOUNTER — Encounter: Payer: Self-pay | Admitting: Nurse Practitioner

## 2018-10-03 ENCOUNTER — Telehealth: Payer: Self-pay | Admitting: Licensed Clinical Social Worker

## 2018-10-03 ENCOUNTER — Ambulatory Visit (HOSPITAL_COMMUNITY)
Admission: RE | Admit: 2018-10-03 | Discharge: 2018-10-03 | Disposition: A | Discharge status: Home / Self Care | Payer: Medicare Other | Source: Ambulatory Visit | Attending: Interventional Radiology | Admitting: Interventional Radiology

## 2018-10-03 ENCOUNTER — Other Ambulatory Visit (HOSPITAL_COMMUNITY): Payer: Self-pay | Admitting: Interventional Radiology

## 2018-10-03 ENCOUNTER — Encounter (HOSPITAL_COMMUNITY): Payer: Self-pay | Admitting: Interventional Radiology

## 2018-10-03 DIAGNOSIS — Z4803 Encounter for change or removal of drains: Secondary | ICD-10-CM | POA: Diagnosis not present

## 2018-10-03 DIAGNOSIS — K810 Acute cholecystitis: Secondary | ICD-10-CM | POA: Diagnosis not present

## 2018-10-03 HISTORY — PX: IR EXCHANGE BILIARY DRAIN: IMG6046

## 2018-10-03 MED ORDER — IOPAMIDOL (ISOVUE-300) INJECTION 61%
INTRAVENOUS | Status: AC
  Administered 2018-10-03: 10 mL
  Filled 2018-10-03: qty 50

## 2018-10-03 MED ORDER — LIDOCAINE HCL 1 % IJ SOLN
INTRAMUSCULAR | Status: AC
  Filled 2018-10-03: qty 20

## 2018-10-03 NOTE — Telephone Encounter (Signed)
SW phoned patient's wife, Pamala Hurry, per the Clinical Navigator's request.  She stated she works and needed assistance with patient when she is not available.  Provided her with various resources.

## 2018-10-10 ENCOUNTER — Other Ambulatory Visit: Payer: Self-pay

## 2018-10-10 DIAGNOSIS — D649 Anemia, unspecified: Secondary | ICD-10-CM

## 2018-10-11 ENCOUNTER — Inpatient Hospital Stay: Payer: Medicare Other

## 2018-10-11 ENCOUNTER — Telehealth: Payer: Self-pay

## 2018-10-11 NOTE — Telephone Encounter (Signed)
Thank you for sharing!

## 2018-10-11 NOTE — Telephone Encounter (Signed)
Called this morning to see why Mr. Hedeen had not shown up for his appt for blood transfusion.  Per his wife, I cancelled blood today and also on 1/28. He is too weak and frail to make the trip.  Patient is under palliative care and will be put on hospice per Pamala Hurry, his wife and primary caretaker. She wanted to thank Karen Chafe., Diane B., and Dr. Julien Nordmann for their wonderful care. Gardiner Rhyme

## 2018-10-13 ENCOUNTER — Other Ambulatory Visit: Payer: Medicare Other | Admitting: Nurse Practitioner

## 2018-10-13 DIAGNOSIS — R630 Anorexia: Secondary | ICD-10-CM

## 2018-10-13 DIAGNOSIS — E46 Unspecified protein-calorie malnutrition: Secondary | ICD-10-CM

## 2018-10-13 DIAGNOSIS — D464 Refractory anemia, unspecified: Secondary | ICD-10-CM

## 2018-10-13 DIAGNOSIS — M545 Low back pain: Secondary | ICD-10-CM

## 2018-10-13 DIAGNOSIS — R269 Unspecified abnormalities of gait and mobility: Secondary | ICD-10-CM

## 2018-10-13 DIAGNOSIS — R531 Weakness: Secondary | ICD-10-CM

## 2018-10-13 DIAGNOSIS — G8929 Other chronic pain: Secondary | ICD-10-CM

## 2018-10-13 DIAGNOSIS — D649 Anemia, unspecified: Secondary | ICD-10-CM

## 2018-10-13 DIAGNOSIS — Z515 Encounter for palliative care: Secondary | ICD-10-CM

## 2018-10-14 ENCOUNTER — Telehealth: Payer: Self-pay | Admitting: Medical Oncology

## 2018-10-14 NOTE — Telephone Encounter (Signed)
Dean Stanley be attending for hospice? I called and told the nurse that Dean Stanley Dean be attending.

## 2018-10-18 ENCOUNTER — Encounter: Payer: Self-pay | Admitting: Nurse Practitioner

## 2018-10-18 NOTE — Progress Notes (Signed)
[facility] Telephone: [phone] Fax: [phone]  PATIENT NAME: Dean Stanley DOB: [birth year] MRN: [id number]  PRIMARY CARE PROVIDER:   Lorene Dy, MD  REFERRING PROVIDER:  Lorene Dy, [place], [address] [place], [place] [address]  RESPONSIBLE PARTY:  Beckie Busing (spouse) [phone] (home and mobile)  HISTORY OF PRESENT ILLNESS:  Dean Stanley is a 85 y.o. year old male with multiple medical problems including MDS, pancytopenia, refractory symptomatic anemia; blood transfusions every three weeks, acute cholecystitis,hypothyroidism, chronic constipation. Palliative Care was asked to help with symptom management and to  address goals of care.   Interim history: now essential bed bound; appetite still poor; methadone has helped with pain; patient did not receive nor does he plan to receive any more blood transfusions.   ASSESSMENT/PLAN/ RECOMMENDATIONS    -Chronic back pain -continue methadone and tramadol   -failure to thrive -weight loss -protein-calorie malnutrition -suspect this is due to advancement of cancer; doubt any benefit of appetite stimulant  -continue oral diet as tolerated -attempt to increase caloric and protein intake  -MDS (Dx 12/16) -Symptomatic anemia -pancytopenia -followed by Dr. Earlie Server -s/p 3 cycles of vidaza; d/c'd by patient due to fatigue and nausea -patient does not want further treatment of cancer  Or further blood transfusions. -patient and spouse would like hospice care  Acute cholecystitis  Hypothyroidism  constipation -patient not surgical candidate due to pancytopenia; has percutaneous drain -denies nausea; eating well -wife flushes -levothyroxine miralax , senna, sorbitol -continue same  ACP -DNR and MOST    I spent 60 minutes providing this consultation,  from 10:00 to 11:00. More than 50% of the time in this consultation was spent coordinating communication.   CODE STATUS: DNR and  MOST  PPS: 30% HOSPICE ELIGIBILITY/DIAGNOSIS: TBD with MD  PAST MEDICAL HISTORY:  Past Medical History:  Diagnosis Date  . Anemia   . Cancer (HCC)    myelodysplastic syndrome  . Complication of anesthesia    Reaction to anesthesia " things went haywire around me"  . Do not resuscitate 11/04/2015  . Hearing impairment    wears hearing left ear  . History of ITP 1960  . Hypothyroidism   . Meniere's syndrome 1980s  . Port catheter in place 12/23/2015    SOCIAL HX:  Social History   Tobacco Use  . Smoking status: Never Smoker  . Smokeless tobacco: Never Used  Substance Use Topics  . Alcohol use: Yes    Comment: Occasional    ALLERGIES:  Allergies  Allergen Reactions  . Percocet [Oxycodone-Acetaminophen] Nausea And Vomiting  . Hydrocodone Nausea And Vomiting  . Oxycodone Nausea And Vomiting     PERTINENT MEDICATIONS:  Outpatient Encounter Medications as of 10/13/2018  Medication Sig  . calcipotriene (DOVONOX) 0.005 % ointment Apply topically 2 (two) times daily as needed (inflammation).   . Darbepoetin Alfa (ARANESP) 300 MCG/0.6ML SOSY injection Inject 300 mcg into the skin every 30 (thirty) days. As needed based on blood counts  . finasteride (PROPECIA) 1 MG tablet Take 1 mg by mouth daily with breakfast.   . Flaxseed, Linseed, (FLAX PO) Take 1 tablet by mouth daily.  Marland Kitchen levothyroxine (SYNTHROID, LEVOTHROID) 175 MCG tablet Take 175 mcg by mouth at bedtime.   . polyethylene glycol (MIRALAX / GLYCOLAX) packet Take 17 g by mouth daily.  Marland Kitchen senna-docusate (SENOKOT-S) 8.6-50 MG tablet Take 2 tablets by mouth 2 (two) times daily.  . sorbitol 70 % SOLN Take 30 mLs by mouth 2 (two) times  daily.  . traMADol (ULTRAM) 50 MG tablet Take 50 mg by mouth every 6 (six) hours as needed for moderate pain.   Marland Kitchen triamcinolone (KENALOG) 0.025 % cream Apply 1 application topically 2 (two) times daily as needed (for inflammation).    Facility-Administered Encounter Medications as of 10/13/2018   Medication  . Darbepoetin Alfa (ARANESP) injection 300 mcg    PHYSICAL EXAM:   General: NAD, frail appearing, cachectic with temporal wasting Cardiovascular: regular rate and rhythm Pulmonary: clear ant fields Abdomen: soft, nontender, + bowel sounds GU: no suprapubic tenderness Extremities: no edema, no joint deformities Skin: no rashes Neurological: Weakness but otherwise nonfocal  Stephanie G Martinique, NP

## 2018-10-25 ENCOUNTER — Other Ambulatory Visit: Payer: No Typology Code available for payment source

## 2018-10-29 DEATH — deceased

## 2018-11-03 ENCOUNTER — Encounter: Payer: Self-pay | Admitting: Internal Medicine

## 2019-03-16 ENCOUNTER — Ambulatory Visit: Payer: Medicare Other | Admitting: Internal Medicine

## 2019-03-16 ENCOUNTER — Other Ambulatory Visit: Payer: Medicare Other

## 2020-03-05 IMAGING — CR DG CHEST 2V
2 series · 2 of 2 positions shown · non-contrast
Comparison: 03/08/2018 and prior chest radiographs.

CLINICAL DATA: Cough and congestion.

EXAM:
CHEST - 2 VIEW

[w chest pa]
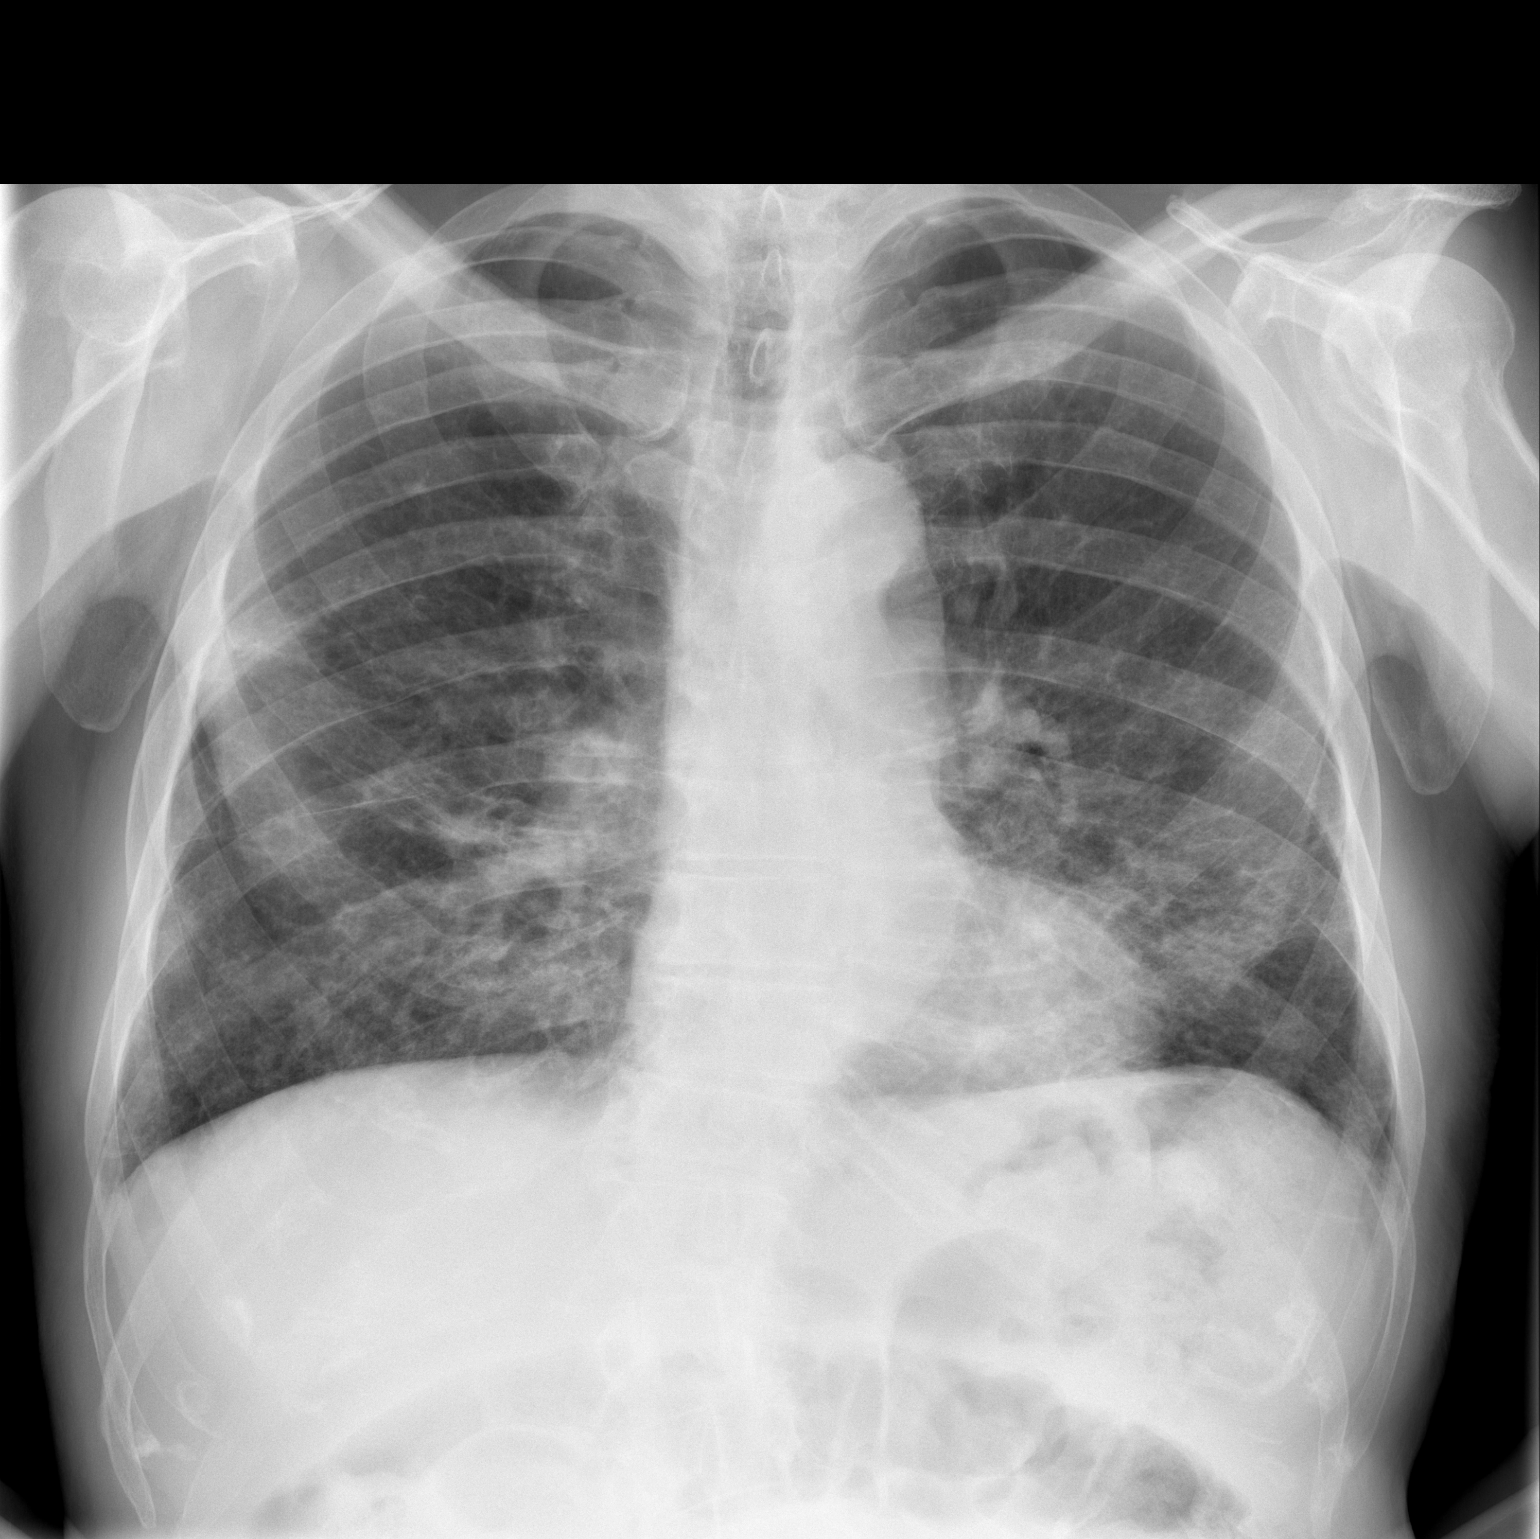

[w chest lat]
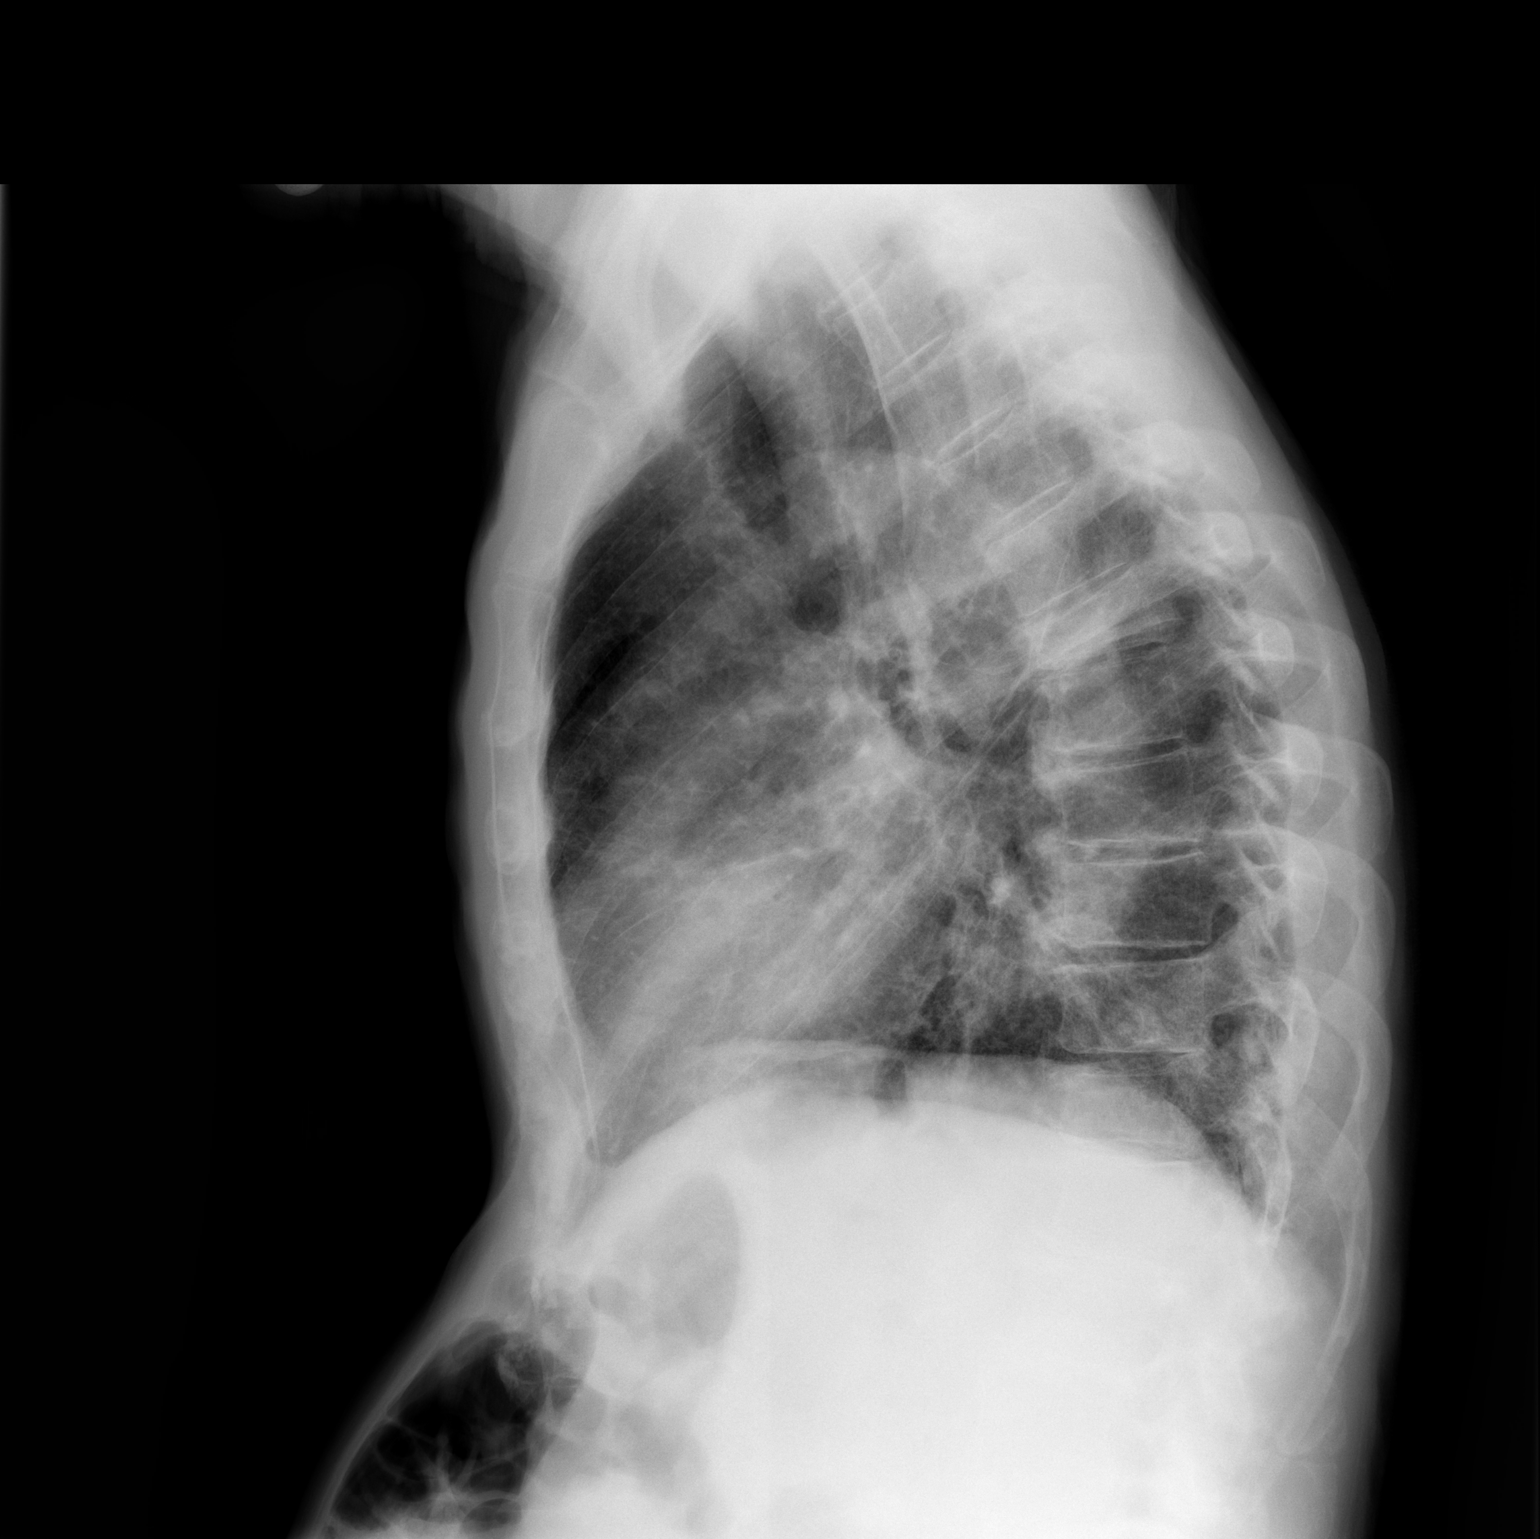

[2 of 2 positions shown; findings below may reference images not displayed]

FINDINGS: The cardiomediastinal silhouette is unremarkable.

Patchy and interstitial opacities within the mid-lower lungs are
unchanged. A more focal peripheral nodular opacity/scarring in the
lateral mid RIGHT lung identified. These opacities are new since
01/21/2012.

No pleural effusion or pneumothorax.

No acute bony abnormalities.
IMPRESSION: Little significant change since 03/08/2018 with nonspecific
pulmonary opacities as described. Given little interval change since
03/08/2018, consider chest CT with contrast or continued
radiographic follow-up as clinically indicated..
